# Patient Record
Sex: Male | Born: 1955 | Race: White | Hispanic: No | Marital: Single | Smoking: Former smoker
Health system: Southern US, Community
[De-identification: ages and names within clinical notes are randomized; demographics above are authoritative.]

## PROBLEM LIST (undated history)

## (undated) DIAGNOSIS — J45909 Unspecified asthma, uncomplicated: Secondary | ICD-10-CM

## (undated) DIAGNOSIS — K219 Gastro-esophageal reflux disease without esophagitis: Secondary | ICD-10-CM

## (undated) DIAGNOSIS — I639 Cerebral infarction, unspecified: Secondary | ICD-10-CM

## (undated) DIAGNOSIS — M109 Gout, unspecified: Secondary | ICD-10-CM

## (undated) DIAGNOSIS — D649 Anemia, unspecified: Secondary | ICD-10-CM

## (undated) DIAGNOSIS — Z992 Dependence on renal dialysis: Secondary | ICD-10-CM

## (undated) DIAGNOSIS — N289 Disorder of kidney and ureter, unspecified: Secondary | ICD-10-CM

## (undated) DIAGNOSIS — I2510 Atherosclerotic heart disease of native coronary artery without angina pectoris: Secondary | ICD-10-CM

## (undated) DIAGNOSIS — R569 Unspecified convulsions: Secondary | ICD-10-CM

## (undated) DIAGNOSIS — M1990 Unspecified osteoarthritis, unspecified site: Secondary | ICD-10-CM

## (undated) DIAGNOSIS — I10 Essential (primary) hypertension: Secondary | ICD-10-CM

## (undated) DIAGNOSIS — J189 Pneumonia, unspecified organism: Secondary | ICD-10-CM

## (undated) HISTORY — PX: AV FISTULA PLACEMENT: SHX1204

## (undated) SURGERY — Surgical Case
Anesthesia: *Unknown

---

## 2006-04-26 ENCOUNTER — Emergency Department: Payer: Self-pay | Admitting: Emergency Medicine

## 2006-11-05 ENCOUNTER — Ambulatory Visit: Payer: Self-pay | Admitting: Cardiovascular Disease

## 2006-11-19 ENCOUNTER — Emergency Department: Payer: Self-pay | Admitting: Emergency Medicine

## 2006-11-19 ENCOUNTER — Other Ambulatory Visit: Payer: Self-pay

## 2007-12-13 ENCOUNTER — Ambulatory Visit: Payer: Self-pay | Admitting: Family Medicine

## 2008-05-30 ENCOUNTER — Ambulatory Visit: Payer: Self-pay | Admitting: Family Medicine

## 2008-10-25 ENCOUNTER — Ambulatory Visit: Payer: Self-pay | Admitting: Family Medicine

## 2009-03-01 ENCOUNTER — Emergency Department: Payer: Self-pay | Admitting: Emergency Medicine

## 2009-05-30 ENCOUNTER — Ambulatory Visit: Payer: Self-pay

## 2009-06-27 ENCOUNTER — Ambulatory Visit: Payer: Self-pay | Admitting: Gastroenterology

## 2010-11-17 DIAGNOSIS — Z992 Dependence on renal dialysis: Secondary | ICD-10-CM

## 2010-11-17 HISTORY — PX: CARDIAC CATHETERIZATION: SHX172

## 2010-11-17 HISTORY — DX: Dependence on renal dialysis: Z99.2

## 2011-09-10 ENCOUNTER — Emergency Department: Payer: Self-pay | Admitting: Internal Medicine

## 2012-09-09 ENCOUNTER — Ambulatory Visit: Payer: Self-pay | Admitting: Vascular Surgery

## 2012-09-09 LAB — CBC
HCT: 31.9 % — ABNORMAL LOW (ref 40.0–52.0)
MCH: 30.5 pg (ref 26.0–34.0)
MCHC: 33.4 g/dL (ref 32.0–36.0)
MCV: 91 fL (ref 80–100)
Platelet: 175 10*3/uL (ref 150–440)
RDW: 14.8 % — ABNORMAL HIGH (ref 11.5–14.5)

## 2012-09-09 LAB — BASIC METABOLIC PANEL
Anion Gap: 8 (ref 7–16)
BUN: 29 mg/dL — ABNORMAL HIGH (ref 7–18)
Chloride: 101 mmol/L (ref 98–107)
Co2: 29 mmol/L (ref 21–32)
Creatinine: 6.98 mg/dL — ABNORMAL HIGH (ref 0.60–1.30)
EGFR (African American): 9 — ABNORMAL LOW
EGFR (Non-African Amer.): 8 — ABNORMAL LOW
Glucose: 123 mg/dL — ABNORMAL HIGH (ref 65–99)

## 2012-09-17 ENCOUNTER — Ambulatory Visit: Payer: Self-pay | Admitting: Vascular Surgery

## 2013-01-24 ENCOUNTER — Ambulatory Visit: Payer: Self-pay | Admitting: Vascular Surgery

## 2013-03-18 ENCOUNTER — Inpatient Hospital Stay: Payer: Self-pay | Admitting: Internal Medicine

## 2013-03-18 LAB — CBC
HGB: 9.2 g/dL — ABNORMAL LOW (ref 13.0–18.0)
MCHC: 34 g/dL (ref 32.0–36.0)
MCV: 97 fL (ref 80–100)
Platelet: 141 10*3/uL — ABNORMAL LOW (ref 150–440)
RBC: 2.79 10*6/uL — ABNORMAL LOW (ref 4.40–5.90)
RDW: 14 % (ref 11.5–14.5)
WBC: 3.5 10*3/uL — ABNORMAL LOW (ref 3.8–10.6)

## 2013-03-18 LAB — COMPREHENSIVE METABOLIC PANEL
Albumin: 3.5 g/dL (ref 3.4–5.0)
Alkaline Phosphatase: 103 U/L (ref 50–136)
Anion Gap: 14 (ref 7–16)
Chloride: 99 mmol/L (ref 98–107)
Co2: 24 mmol/L (ref 21–32)
Creatinine: 20.64 mg/dL — ABNORMAL HIGH (ref 0.60–1.30)
EGFR (African American): 3 — ABNORMAL LOW
EGFR (Non-African Amer.): 2 — ABNORMAL LOW
Potassium: 4.7 mmol/L (ref 3.5–5.1)
SGOT(AST): 10 U/L — ABNORMAL LOW (ref 15–37)
SGPT (ALT): 22 U/L (ref 12–78)

## 2013-03-18 LAB — TROPONIN I: Troponin-I: <0.02 ng/mL

## 2013-03-18 LAB — POTASSIUM: Potassium: 4.7 mmol/L (ref 3.5–5.1)

## 2013-03-19 LAB — CBC WITH DIFFERENTIAL/PLATELET
Basophil #: 0 10*3/uL (ref 0.0–0.1)
Basophil %: 0.2 %
Eosinophil %: 0.3 %
HCT: 29.4 % — ABNORMAL LOW (ref 40.0–52.0)
HGB: 10.1 g/dL — ABNORMAL LOW (ref 13.0–18.0)
Lymphocyte #: 0.4 10*3/uL — ABNORMAL LOW (ref 1.0–3.6)
MCHC: 34.2 g/dL (ref 32.0–36.0)
Neutrophil #: 1.4 10*3/uL (ref 1.4–6.5)
Neutrophil %: 72.4 %
Platelet: 144 10*3/uL — ABNORMAL LOW (ref 150–440)
RBC: 3.06 10*6/uL — ABNORMAL LOW (ref 4.40–5.90)
RDW: 14 % (ref 11.5–14.5)
WBC: 2 10*3/uL — CL (ref 3.8–10.6)

## 2013-03-19 LAB — BASIC METABOLIC PANEL
Anion Gap: 12 (ref 7–16)
Co2: 26 mmol/L (ref 21–32)
Creatinine: 15.36 mg/dL — ABNORMAL HIGH (ref 0.60–1.30)
EGFR (African American): 4 — ABNORMAL LOW

## 2013-03-19 LAB — PHOSPHORUS: Phosphorus: 5.8 mg/dL — ABNORMAL HIGH (ref 2.5–4.9)

## 2013-04-09 ENCOUNTER — Inpatient Hospital Stay: Payer: Self-pay | Admitting: Family Medicine

## 2013-04-09 LAB — CBC
HCT: 28.1 % — ABNORMAL LOW (ref 40.0–52.0)
MCHC: 33.9 g/dL (ref 32.0–36.0)
MCV: 99 fL (ref 80–100)
Platelet: 117 10*3/uL — ABNORMAL LOW (ref 150–440)
RBC: 2.84 10*6/uL — ABNORMAL LOW (ref 4.40–5.90)
RDW: 14.6 % — ABNORMAL HIGH (ref 11.5–14.5)
WBC: 4.4 10*3/uL (ref 3.8–10.6)

## 2013-04-09 LAB — BASIC METABOLIC PANEL
Anion Gap: 12 (ref 7–16)
Chloride: 105 mmol/L (ref 98–107)
Co2: 24 mmol/L (ref 21–32)
Creatinine: 14.73 mg/dL — ABNORMAL HIGH (ref 0.60–1.30)
EGFR (African American): 4 — ABNORMAL LOW
EGFR (Non-African Amer.): 3 — ABNORMAL LOW
Glucose: 82 mg/dL (ref 65–99)
Osmolality: 298 (ref 275–301)
Potassium: 4.1 mmol/L (ref 3.5–5.1)
Sodium: 141 mmol/L (ref 136–145)

## 2013-04-09 LAB — PHOSPHORUS: Phosphorus: 3.4 mg/dL (ref 2.5–4.9)

## 2013-04-10 LAB — CBC WITH DIFFERENTIAL/PLATELET
Eosinophil %: 2.1 %
Lymphocyte #: 1 10*3/uL (ref 1.0–3.6)
MCHC: 34.4 g/dL (ref 32.0–36.0)
MCV: 99 fL (ref 80–100)
Monocyte %: 15.3 %
Neutrophil #: 3.3 10*3/uL (ref 1.4–6.5)
Neutrophil %: 63.4 %
Platelet: 126 10*3/uL — ABNORMAL LOW (ref 150–440)
RBC: 2.81 10*6/uL — ABNORMAL LOW (ref 4.40–5.90)
RDW: 15.1 % — ABNORMAL HIGH (ref 11.5–14.5)
WBC: 5.3 10*3/uL (ref 3.8–10.6)

## 2013-04-10 LAB — COMPREHENSIVE METABOLIC PANEL
Alkaline Phosphatase: 65 U/L (ref 50–136)
Anion Gap: 9 (ref 7–16)
Bilirubin,Total: 0.4 mg/dL (ref 0.2–1.0)
Calcium, Total: 9.1 mg/dL (ref 8.5–10.1)
Co2: 29 mmol/L (ref 21–32)
Glucose: 78 mg/dL (ref 65–99)
Osmolality: 283 (ref 275–301)
SGOT(AST): 20 U/L (ref 15–37)
SGPT (ALT): 20 U/L (ref 12–78)
Sodium: 139 mmol/L (ref 136–145)
Total Protein: 6.3 g/dL — ABNORMAL LOW (ref 6.4–8.2)

## 2013-04-19 ENCOUNTER — Emergency Department: Payer: Self-pay | Admitting: Emergency Medicine

## 2013-04-19 LAB — URINALYSIS, COMPLETE
Bilirubin,UR: NEGATIVE
Glucose,UR: 150 mg/dL (ref 0–75)
Leukocyte Esterase: NEGATIVE
Nitrite: NEGATIVE
RBC,UR: NONE SEEN /HPF (ref 0–5)
Specific Gravity: 1.006 (ref 1.003–1.030)
WBC UR: 1 /HPF (ref 0–5)

## 2013-04-19 LAB — COMPREHENSIVE METABOLIC PANEL
Albumin: 3.7 g/dL (ref 3.4–5.0)
Alkaline Phosphatase: 88 U/L (ref 50–136)
Bilirubin,Total: 0.5 mg/dL (ref 0.2–1.0)
Calcium, Total: 9.3 mg/dL (ref 8.5–10.1)
Chloride: 101 mmol/L (ref 98–107)
EGFR (Non-African Amer.): 4 — ABNORMAL LOW
Glucose: 142 mg/dL — ABNORMAL HIGH (ref 65–99)
Potassium: 3.9 mmol/L (ref 3.5–5.1)
SGOT(AST): 38 U/L — ABNORMAL HIGH (ref 15–37)
Sodium: 138 mmol/L (ref 136–145)
Total Protein: 6.9 g/dL (ref 6.4–8.2)

## 2013-04-19 LAB — CBC
HGB: 10.9 g/dL — ABNORMAL LOW (ref 13.0–18.0)
MCH: 34.8 pg — ABNORMAL HIGH (ref 26.0–34.0)
MCV: 102 fL — ABNORMAL HIGH (ref 80–100)
Platelet: 189 10*3/uL (ref 150–440)

## 2013-04-19 LAB — DRUG SCREEN, URINE
Barbiturates, Ur Screen: NEGATIVE (ref ?–200)
Benzodiazepine, Ur Scrn: NEGATIVE (ref ?–200)
Cannabinoid 50 Ng, Ur ~~LOC~~: NEGATIVE (ref ?–50)
MDMA (Ecstasy)Ur Screen: NEGATIVE (ref ?–500)
Methadone, Ur Screen: NEGATIVE (ref ?–300)
Phencyclidine (PCP) Ur S: NEGATIVE (ref ?–25)
Tricyclic, Ur Screen: NEGATIVE (ref ?–1000)

## 2013-04-19 LAB — ETHANOL
Ethanol %: <0.003 % (ref 0.000–0.080)
Ethanol: <3 mg/dL

## 2013-05-18 DIAGNOSIS — Z1152 Encounter for screening for COVID-19: Secondary | ICD-10-CM | POA: Insufficient documentation

## 2013-06-15 ENCOUNTER — Ambulatory Visit: Payer: Self-pay | Admitting: Family Medicine

## 2013-07-05 ENCOUNTER — Observation Stay: Payer: Self-pay | Admitting: Specialist

## 2013-07-05 LAB — CBC
HCT: 34.5 % — ABNORMAL LOW (ref 40.0–52.0)
Platelet: 161 10*3/uL (ref 150–440)
WBC: 6.1 10*3/uL (ref 3.8–10.6)

## 2013-07-05 LAB — COMPREHENSIVE METABOLIC PANEL
Albumin: 4.1 g/dL (ref 3.4–5.0)
Anion Gap: 9 (ref 7–16)
BUN: 57 mg/dL — ABNORMAL HIGH (ref 7–18)
Bilirubin,Total: 0.4 mg/dL (ref 0.2–1.0)
Calcium, Total: 10 mg/dL (ref 8.5–10.1)
Co2: 25 mmol/L (ref 21–32)
EGFR (African American): 5 — ABNORMAL LOW
Glucose: 85 mg/dL (ref 65–99)
Osmolality: 283 (ref 275–301)
Sodium: 134 mmol/L — ABNORMAL LOW (ref 136–145)

## 2013-07-05 LAB — PHOSPHORUS: Phosphorus: 5.4 mg/dL — ABNORMAL HIGH (ref 2.5–4.9)

## 2013-07-06 DIAGNOSIS — I059 Rheumatic mitral valve disease, unspecified: Secondary | ICD-10-CM

## 2013-07-06 LAB — CBC WITH DIFFERENTIAL/PLATELET
Basophil #: 0 10*3/uL (ref 0.0–0.1)
Basophil %: 0.9 %
Eosinophil %: 3.2 %
HCT: 33.2 % — ABNORMAL LOW (ref 40.0–52.0)
HGB: 11.4 g/dL — ABNORMAL LOW (ref 13.0–18.0)
Lymphocyte #: 1 10*3/uL (ref 1.0–3.6)
MCH: 33.6 pg (ref 26.0–34.0)
MCV: 98 fL (ref 80–100)
Monocyte %: 10.5 %
Neutrophil %: 67 %
RBC: 3.39 10*6/uL — ABNORMAL LOW (ref 4.40–5.90)
RDW: 13.4 % (ref 11.5–14.5)
WBC: 5.7 10*3/uL (ref 3.8–10.6)

## 2013-07-06 LAB — BASIC METABOLIC PANEL
Anion Gap: 7 (ref 7–16)
Chloride: 98 mmol/L (ref 98–107)
Creatinine: 8.33 mg/dL — ABNORMAL HIGH (ref 0.60–1.30)
Glucose: 86 mg/dL (ref 65–99)
Osmolality: 277 (ref 275–301)
Potassium: 3.8 mmol/L (ref 3.5–5.1)
Sodium: 136 mmol/L (ref 136–145)

## 2013-07-06 LAB — TROPONIN I
Troponin-I: 0.03 ng/mL
Troponin-I: 0.03 ng/mL

## 2013-07-06 LAB — LIPID PANEL
Cholesterol: 122 mg/dL (ref 0–200)
Ldl Cholesterol, Calc: 52 mg/dL (ref 0–100)

## 2013-07-06 LAB — TSH: Thyroid Stimulating Horm: 2.6 u[IU]/mL

## 2013-07-11 DIAGNOSIS — K219 Gastro-esophageal reflux disease without esophagitis: Secondary | ICD-10-CM | POA: Insufficient documentation

## 2013-12-30 ENCOUNTER — Ambulatory Visit: Payer: Self-pay | Admitting: Vascular Surgery

## 2014-05-09 DIAGNOSIS — D689 Coagulation defect, unspecified: Secondary | ICD-10-CM | POA: Insufficient documentation

## 2014-05-11 ENCOUNTER — Ambulatory Visit: Payer: Self-pay | Admitting: Vascular Surgery

## 2014-05-18 ENCOUNTER — Emergency Department: Payer: Self-pay

## 2014-05-18 LAB — BASIC METABOLIC PANEL
ANION GAP: 7 (ref 7–16)
BUN: 12 mg/dL (ref 7–18)
CALCIUM: 8.3 mg/dL — AB (ref 8.5–10.1)
CO2: 33 mmol/L — AB (ref 21–32)
CREATININE: 5.73 mg/dL — AB (ref 0.60–1.30)
Chloride: 98 mmol/L (ref 98–107)
EGFR (African American): 12 — ABNORMAL LOW
GFR CALC NON AF AMER: 10 — AB
GLUCOSE: 88 mg/dL (ref 65–99)
Osmolality: 275 (ref 275–301)
POTASSIUM: 3.8 mmol/L (ref 3.5–5.1)
Sodium: 138 mmol/L (ref 136–145)

## 2014-05-18 LAB — CBC
HCT: 29.3 % — ABNORMAL LOW (ref 40.0–52.0)
HGB: 10 g/dL — ABNORMAL LOW (ref 13.0–18.0)
MCH: 33.2 pg (ref 26.0–34.0)
MCHC: 34 g/dL (ref 32.0–36.0)
MCV: 98 fL (ref 80–100)
Platelet: 136 10*3/uL — ABNORMAL LOW (ref 150–440)
RBC: 3 10*6/uL — AB (ref 4.40–5.90)
RDW: 13.4 % (ref 11.5–14.5)
WBC: 3.9 10*3/uL (ref 3.8–10.6)

## 2014-06-26 ENCOUNTER — Ambulatory Visit: Payer: Self-pay | Admitting: Vascular Surgery

## 2014-06-28 ENCOUNTER — Ambulatory Visit: Payer: Self-pay | Admitting: Vascular Surgery

## 2014-06-28 LAB — POTASSIUM: POTASSIUM: 5 mmol/L (ref 3.5–5.1)

## 2014-06-29 DIAGNOSIS — T82898A Other specified complication of vascular prosthetic devices, implants and grafts, initial encounter: Secondary | ICD-10-CM | POA: Insufficient documentation

## 2014-07-06 ENCOUNTER — Ambulatory Visit: Payer: Self-pay | Admitting: Vascular Surgery

## 2014-07-17 DIAGNOSIS — M1009 Idiopathic gout, multiple sites: Secondary | ICD-10-CM | POA: Insufficient documentation

## 2014-07-17 DIAGNOSIS — G20 Parkinson's disease: Secondary | ICD-10-CM | POA: Insufficient documentation

## 2014-07-17 DIAGNOSIS — J4520 Mild intermittent asthma, uncomplicated: Secondary | ICD-10-CM | POA: Insufficient documentation

## 2014-08-03 DIAGNOSIS — R197 Diarrhea, unspecified: Secondary | ICD-10-CM | POA: Insufficient documentation

## 2014-08-03 DIAGNOSIS — R52 Pain, unspecified: Secondary | ICD-10-CM | POA: Insufficient documentation

## 2014-08-03 DIAGNOSIS — T829XXA Unspecified complication of cardiac and vascular prosthetic device, implant and graft, initial encounter: Secondary | ICD-10-CM | POA: Insufficient documentation

## 2014-10-23 ENCOUNTER — Ambulatory Visit: Payer: Self-pay | Admitting: Vascular Surgery

## 2014-10-25 ENCOUNTER — Emergency Department (HOSPITAL_COMMUNITY): Payer: Medicare HMO

## 2014-10-25 ENCOUNTER — Observation Stay (HOSPITAL_COMMUNITY)
Admission: EM | Admit: 2014-10-25 | Discharge: 2014-10-26 | Disposition: A | Discharge status: Home / Self Care | Payer: Medicare HMO | Attending: Internal Medicine | Admitting: Internal Medicine

## 2014-10-25 ENCOUNTER — Encounter (HOSPITAL_COMMUNITY): Payer: Self-pay | Admitting: Emergency Medicine

## 2014-10-25 DIAGNOSIS — Z992 Dependence on renal dialysis: Secondary | ICD-10-CM | POA: Insufficient documentation

## 2014-10-25 DIAGNOSIS — G40909 Epilepsy, unspecified, not intractable, without status epilepticus: Secondary | ICD-10-CM | POA: Insufficient documentation

## 2014-10-25 DIAGNOSIS — N186 End stage renal disease: Secondary | ICD-10-CM | POA: Diagnosis not present

## 2014-10-25 DIAGNOSIS — Z87891 Personal history of nicotine dependence: Secondary | ICD-10-CM | POA: Diagnosis not present

## 2014-10-25 DIAGNOSIS — I120 Hypertensive chronic kidney disease with stage 5 chronic kidney disease or end stage renal disease: Secondary | ICD-10-CM | POA: Diagnosis not present

## 2014-10-25 DIAGNOSIS — I10 Essential (primary) hypertension: Secondary | ICD-10-CM | POA: Diagnosis present

## 2014-10-25 DIAGNOSIS — I25119 Atherosclerotic heart disease of native coronary artery with unspecified angina pectoris: Secondary | ICD-10-CM

## 2014-10-25 DIAGNOSIS — I2510 Atherosclerotic heart disease of native coronary artery without angina pectoris: Secondary | ICD-10-CM | POA: Insufficient documentation

## 2014-10-25 DIAGNOSIS — R079 Chest pain, unspecified: Principal | ICD-10-CM | POA: Insufficient documentation

## 2014-10-25 DIAGNOSIS — Z9889 Other specified postprocedural states: Secondary | ICD-10-CM | POA: Insufficient documentation

## 2014-10-25 DIAGNOSIS — R0789 Other chest pain: Secondary | ICD-10-CM | POA: Diagnosis present

## 2014-10-25 DIAGNOSIS — R569 Unspecified convulsions: Secondary | ICD-10-CM

## 2014-10-25 HISTORY — DX: Unspecified convulsions: R56.9

## 2014-10-25 HISTORY — DX: Dependence on renal dialysis: Z99.2

## 2014-10-25 HISTORY — DX: Essential (primary) hypertension: I10

## 2014-10-25 HISTORY — DX: Disorder of kidney and ureter, unspecified: N28.9

## 2014-10-25 HISTORY — DX: Atherosclerotic heart disease of native coronary artery without angina pectoris: I25.10

## 2014-10-25 LAB — CBC WITH DIFFERENTIAL/PLATELET
BASOS ABS: 0 10*3/uL (ref 0.0–0.1)
Basophils Relative: 0 % (ref 0–1)
Eosinophils Absolute: 0.1 10*3/uL (ref 0.0–0.7)
Eosinophils Relative: 2 % (ref 0–5)
HCT: 29.9 % — ABNORMAL LOW (ref 39.0–52.0)
Hemoglobin: 9.7 g/dL — ABNORMAL LOW (ref 13.0–17.0)
LYMPHS ABS: 1.5 10*3/uL (ref 0.7–4.0)
LYMPHS PCT: 23 % (ref 12–46)
MCH: 31.2 pg (ref 26.0–34.0)
MCHC: 32.4 g/dL (ref 30.0–36.0)
MCV: 96.1 fL (ref 78.0–100.0)
Monocytes Absolute: 0.8 10*3/uL (ref 0.1–1.0)
Monocytes Relative: 12 % (ref 3–12)
Neutro Abs: 4 10*3/uL (ref 1.7–7.7)
Neutrophils Relative %: 63 % (ref 43–77)
PLATELETS: 203 10*3/uL (ref 150–400)
RBC: 3.11 MIL/uL — AB (ref 4.22–5.81)
RDW: 14.2 % (ref 11.5–15.5)
WBC: 6.5 10*3/uL (ref 4.0–10.5)

## 2014-10-25 LAB — BASIC METABOLIC PANEL
ANION GAP: 18 — AB (ref 5–15)
BUN: 33 mg/dL — ABNORMAL HIGH (ref 6–23)
CO2: 29 meq/L (ref 19–32)
Calcium: 9.9 mg/dL (ref 8.4–10.5)
Chloride: 93 mEq/L — ABNORMAL LOW (ref 96–112)
Creatinine, Ser: 8.56 mg/dL — ABNORMAL HIGH (ref 0.50–1.35)
GFR calc Af Amer: 7 mL/min — ABNORMAL LOW (ref >90)
GFR calc non Af Amer: 6 mL/min — ABNORMAL LOW (ref >90)
GLUCOSE: 81 mg/dL (ref 70–99)
POTASSIUM: 4.9 meq/L (ref 3.7–5.3)
Sodium: 140 mEq/L (ref 137–147)

## 2014-10-25 LAB — I-STAT TROPONIN, ED: Troponin i, poc: 0.01 ng/mL (ref 0.00–0.08)

## 2014-10-25 MED ORDER — SODIUM CHLORIDE 0.9 % IV SOLN: 250.0000 mL | INTRAVENOUS | Status: DC | PRN

## 2014-10-25 MED ORDER — LEVETIRACETAM 500 MG PO TABS
500.0000 mg | ORAL_TABLET | Freq: Two times a day (BID) | ORAL | Status: DC
  Administered 2014-10-26 (×2): 500 mg via ORAL
  Filled 2014-10-25 (×2): qty 1

## 2014-10-25 MED ORDER — CARVEDILOL 25 MG PO TABS: 25.0000 mg | ORAL_TABLET | Freq: Every evening | ORAL | Status: DC

## 2014-10-25 MED ORDER — ONDANSETRON HCL 4 MG/2ML IJ SOLN: 4.0000 mg | Freq: Four times a day (QID) | INTRAMUSCULAR | Status: DC | PRN

## 2014-10-25 MED ORDER — SEVELAMER CARBONATE 800 MG PO TABS
2400.0000 mg | ORAL_TABLET | Freq: Three times a day (TID) | ORAL | Status: DC
  Administered 2014-10-26: 2400 mg via ORAL
  Filled 2014-10-25: qty 3

## 2014-10-25 MED ORDER — ONDANSETRON HCL 4 MG PO TABS: 4.0000 mg | ORAL_TABLET | Freq: Four times a day (QID) | ORAL | Status: DC | PRN

## 2014-10-25 MED ORDER — ASPIRIN EC 325 MG PO TBEC: 325.0000 mg | DELAYED_RELEASE_TABLET | Freq: Every day | ORAL | Status: DC

## 2014-10-25 MED ORDER — PANTOPRAZOLE SODIUM 40 MG PO TBEC: 40.0000 mg | DELAYED_RELEASE_TABLET | Freq: Every day | ORAL | Status: DC

## 2014-10-25 MED ORDER — SODIUM CHLORIDE 0.9 % IJ SOLN: 3.0000 mL | INTRAMUSCULAR | Status: DC | PRN

## 2014-10-25 MED ORDER — SODIUM CHLORIDE 0.9 % IJ SOLN: 3.0000 mL | Freq: Two times a day (BID) | INTRAMUSCULAR | Status: DC

## 2014-10-25 MED ORDER — ACETAMINOPHEN 325 MG PO TABS: 650.0000 mg | ORAL_TABLET | Freq: Four times a day (QID) | ORAL | Status: DC | PRN

## 2014-10-25 MED ORDER — HEPARIN SODIUM (PORCINE) 5000 UNIT/ML IJ SOLN
5000.0000 [IU] | Freq: Three times a day (TID) | INTRAMUSCULAR | Status: DC
  Administered 2014-10-26 (×2): 5000 [IU] via SUBCUTANEOUS
  Filled 2014-10-25 (×2): qty 1

## 2014-10-25 MED ORDER — ACETAMINOPHEN 650 MG RE SUPP: 650.0000 mg | Freq: Four times a day (QID) | RECTAL | Status: DC | PRN

## 2014-10-25 MED ORDER — CINACALCET HCL 30 MG PO TABS
30.0000 mg | ORAL_TABLET | Freq: Every day | ORAL | Status: DC
  Administered 2014-10-26: 30 mg via ORAL
  Filled 2014-10-25 (×2): qty 1

## 2014-10-25 MED ORDER — VITAMIN D 1000 UNITS PO TABS
1000.0000 [IU] | ORAL_TABLET | Freq: Every day | ORAL | Status: DC
  Filled 2014-10-25: qty 1

## 2014-10-25 MED ORDER — HYDROMORPHONE HCL 1 MG/ML IJ SOLN: 0.5000 mg | INTRAMUSCULAR | Status: DC | PRN

## 2014-10-25 MED ORDER — FOLIC ACID 0.5 MG HALF TAB
400.0000 ug | ORAL_TABLET | Freq: Every day | ORAL | Status: DC
  Filled 2014-10-25: qty 1

## 2014-10-25 MED ORDER — MORPHINE SULFATE 4 MG/ML IJ SOLN
4.0000 mg | Freq: Once | INTRAMUSCULAR | Status: AC
  Administered 2014-10-25: 4 mg via INTRAVENOUS
  Filled 2014-10-25: qty 1

## 2014-10-25 MED ORDER — LACOSAMIDE 50 MG PO TABS
150.0000 mg | ORAL_TABLET | Freq: Two times a day (BID) | ORAL | Status: DC
  Administered 2014-10-26 (×2): 150 mg via ORAL
  Filled 2014-10-25 (×2): qty 3

## 2014-10-25 MED ORDER — FELODIPINE ER 5 MG PO TB24
5.0000 mg | ORAL_TABLET | Freq: Every day | ORAL | Status: DC
  Filled 2014-10-25: qty 1

## 2014-10-25 MED ORDER — ALLOPURINOL 100 MG PO TABS: 200.0000 mg | ORAL_TABLET | Freq: Every day | ORAL | Status: DC

## 2014-10-25 MED ORDER — NITROGLYCERIN 2 % TD OINT
1.0000 [in_us] | TOPICAL_OINTMENT | Freq: Four times a day (QID) | TRANSDERMAL | Status: DC
  Administered 2014-10-25 – 2014-10-26 (×2): 1 [in_us] via TOPICAL
  Filled 2014-10-25: qty 1
  Filled 2014-10-25: qty 30

## 2014-10-25 NOTE — ED Notes (Signed)
Pending patient approval, contact Sister if needed  Jeremy Rose  [phone]

## 2014-10-25 NOTE — ED Provider Notes (Signed)
CSN: [id number]     Arrival date & time 10/25/14  1958 History   First MD Initiated Contact with Patient 10/25/14 2000     Chief Complaint  Patient presents with  . Chest Pain     (Consider location/radiation/quality/duration/timing/severity/associated sxs/prior Treatment) Patient is a 58 y.o. male presenting with chest pain. The history is provided by the patient and medical records.  Chest Pain   This is a 58 year old male with past medical history significant for hypertension, coronary artery disease, end-stage renal disease on hemodialysis, seizure disorder, presenting to the ED for chest pain.  States pain began this afternoon around 1730 while he was doing yard work, specifically he was raking leaves and cleaning up brush. States pain localized to the right side of his chest without associated shortness of breath, diaphoresis, dizziness, nausea, or vomiting.  He describes the pain as a "soreness".  Patient states he went inside and attempted to rest without relief. Patient does have family history of heart disease, mother had an MI at age 39. Patient is a former smoker. Last completed dialysis yesterday, full treatment. No noted cough, fever, sweats, or chills. No prior hx of DVT or PE.  Patient was given 325 of aspirin and nitroglycerin in route with noted improvement of his symptoms.  Patient states he has been evaluated by cardiology in the past in [place], [place] recall name of physician. PCP-- [facility] in [place], [place]   Past Medical History  Diagnosis Date  . Coronary artery disease   . Hypertension   . Renal disorder   . Dialysis patient     T, TH, S  . Seizures    Past Surgical History  Procedure Laterality Date  . Av fistula placement Left   . Cardiac catheterization     History reviewed. No pertinent family history. History  Substance Use Topics  . Smoking status: Former Research scientist (life sciences)  . Smokeless tobacco: Former Systems developer  . Alcohol Use: No    Review of Systems   Cardiovascular: Positive for chest pain.  All other systems reviewed and are negative.     Allergies  Codeine  Home Medications   Prior to Admission medications   Not on File   BP 117/72 mmHg  Pulse 71  Temp(Src) 97.8 F (36.6 C) (Oral)  Resp 15  Ht 5\' 8"  (1.727 m)  Wt 180 lb (81.647 kg)  BMI 27.38 kg/m2  SpO2 97%   Physical Exam  Constitutional: He is oriented to person, place, and time. He appears well-developed and well-nourished. No distress.  HENT:  Head: Normocephalic and atraumatic.  Mouth/Throat: Oropharynx is clear and moist.  Eyes: Conjunctivae and EOM are normal. Pupils are equal, round, and reactive to light.  Neck: Normal range of motion. Neck supple.  Cardiovascular: Normal rate, regular rhythm and normal heart sounds.   Pulmonary/Chest: Effort normal and breath sounds normal. No respiratory distress. He has no wheezes.  Abdominal: Soft. Bowel sounds are normal. There is no tenderness. There is no guarding.  Musculoskeletal: Normal range of motion. He exhibits no edema.  Dialysis fistula of left forearm with overlying bandages, strong thrill present  Neurological: He is alert and oriented to person, place, and time.  Skin: Skin is warm and dry. He is not diaphoretic.  Psychiatric: He has a normal mood and affect.  Nursing note and vitals reviewed.   ED Course  Procedures (including critical care time) Labs Review Labs Reviewed  CBC WITH DIFFERENTIAL - Abnormal; Notable for the following:  RBC 3.11 (*)    Hemoglobin 9.7 (*)    HCT 29.9 (*)    All other components within normal limits  BASIC METABOLIC PANEL - Abnormal; Notable for the following:    Chloride 93 (*)    BUN 33 (*)    Creatinine, Ser 8.56 (*)    GFR calc non Af Amer 6 (*)    GFR calc Af Amer 7 (*)    Anion gap 18 (*)    All other components within normal limits  I-STAT TROPOININ, ED    Imaging Review Dg Chest 2 View  10/25/2014   CLINICAL DATA:  Initial encounter for chest  pain after dialysis today.  EXAM: CHEST  2 VIEW  COMPARISON:  None.  FINDINGS: The lungs are clear without focal infiltrate, edema, pneumothorax or pleural effusion. Interstitial markings are diffusely coarsened with chronic features. Cardiopericardial silhouette is at upper limits of normal for size. Imaged bony structures of the thorax are intact.  IMPRESSION: No active cardiopulmonary disease.   Electronically Signed   By: Misty Stanley M.D.   On: 10/25/2014 21:23     EKG Interpretation   Date/Time:  Wednesday October 25 2014 20:03:32 EST Ventricular Rate:  66 PR Interval:  163 QRS Duration: 92 QT Interval:  387 QTC Calculation: 405 R Axis:   15 Text Interpretation:  Sinus rhythm No old tracing to compare Confirmed by  Debby Freiberg [phone]) on 10/25/2014 9:37:04 PM      MDM   Final diagnoses:  Chest pain   58 year old male with new onset exertional chest pain today while raking leaves, unrelieved by rest.  On exam, pain somewhat reproducible with palpation of right chest wall.  EKG sinus rhythm without ischemic changes. Will obtain labs, troponin, chest x-ray.  Labwork with creatinine 8.56 consistent with end-stage renal disease. Potassium normal 4.9.  Troponin negative.  CXR clear.  Patient with new exertional chest pain and HEART score of 5, feel he would benefit from cardiac r/o.  Case discussed with hospitalist, Dr. Arnoldo Morale, who will admit for observation.  Temp admission orders placed.  Larene Pickett, PA-C 10/26/14 0102  Debby Freiberg, MD 10/30/14 0900

## 2014-10-25 NOTE — H&P (Signed)
[facility] Admission History and Physical       Jeremy Rose [id number] DOB: October 13, 1956 DOA: 10/25/2014  Referring physician: EDP PCP: Pcp Not In System  Specialists:   Chief Complaint: Chest Pain  HPI: Jeremy Rose is a 58 y.o. male with a history of CAD, HTN, ESRD on HD (T, TH, Sat), and history of Seizures who presents to the ED with complaints of Substernal Chest pain described as soreness rated at a 6/10 at the worse that started at 5:30 pm after he was working in his yard.  He denies having any SOB, nausea, or vomiting or diaphoresis.  The pain was not relieved until he had been given NTG x 2 and Aspirin by EMS.   His ED initial workup has been negative.   Review of Systems:  Constitutional: No Weight Loss, No Weight Gain, Night Sweats, Fevers, Chills, Dizziness, Fatigue, or Generalized Weakness HEENT: No Headaches, Difficulty Swallowing,Tooth/Dental Problems,Sore Throat,  No Sneezing, Rhinitis, Ear Ache, Nasal Congestion, or Post Nasal Drip,  Cardio-vascular:   +Chest pain, Orthopnea, PND, Edema in Lower Extremities, Anasarca, Dizziness, Palpitations  Resp: No Dyspnea, No DOE, No Cough, No Hemoptysis, No Wheezing.    GI: No Heartburn, Indigestion, Abdominal Pain, Nausea, Vomiting, Diarrhea, Hematemesis, Hematochezia, Melena, Change in Bowel Habits,  Loss of Appetite  GU: No Dysuria, Change in Color of Urine, No Urgency or Frequency, No Flank pain.  Musculoskeletal: No Joint Pain or Swelling, No Decreased Range of Motion, No Back Pain.  Neurologic: No Syncope, No Seizures, Muscle Weakness, Paresthesia, Vision Disturbance or Loss, No Diplopia, No Vertigo, No Difficulty Walking,  Skin: No Rash or Lesions. Psych: No Change in Mood or Affect, No Depression or Anxiety, No Memory loss, No Confusion, or Hallucinations   Past Medical History  Diagnosis Date  . Coronary artery disease   . Hypertension   . Renal disorder   . Dialysis patient     T, TH, S  .  Seizures       Past Surgical History  Procedure Laterality Date  . Av fistula placement Left   . Cardiac catheterization         Prior to Admission medications   Medication Sig Start Date End Date Taking? Authorizing Provider  cinacalcet (SENSIPAR) 30 MG tablet Take 30 mg by mouth daily.   Yes Historical Provider, MD  albuterol (PROVENTIL HFA;VENTOLIN HFA) 108 (90 BASE) MCG/ACT inhaler Inhale into the lungs every 6 (six) hours as needed for wheezing or shortness of breath.    Historical Provider, MD  allopurinol (ZYLOPRIM) 100 MG tablet Take 200 mg by mouth daily.   Yes Historical Provider, MD  beclomethasone (QVAR) 80 MCG/ACT inhaler Inhale 1 puff into the lungs as needed (for shortness of breath).    Historical Provider, MD  carvedilol (COREG) 25 MG tablet Take 25 mg by mouth every evening.   Yes Historical Provider, MD  Cholecalciferol 1000 UNITS capsule Take 1,000 Units by mouth daily.   Yes Historical Provider, MD  felodipine (PLENDIL) 5 MG 24 hr tablet Take 5 mg by mouth daily.   Yes Historical Provider, MD  folic acid (FOLVITE) A999333 MCG tablet Take 400 mcg by mouth daily.   Yes Historical Provider, MD  Lacosamide (VIMPAT) 150 MG TABS Take 150 mg by mouth 2 (two) times daily. Takes extra tab on Tues, Thurs, and Sat   Yes Historical Provider, MD  levETIRAcetam (KEPPRA) 500 MG tablet Take 500 mg by mouth 2 (two) times daily. Takes extra with HD  Yes Historical Provider, MD  omeprazole (PRILOSEC) 20 MG capsule Take 20 mg by mouth daily.   Yes Historical Provider, MD  sevelamer carbonate (RENVELA) 800 MG tablet Take 2,400 mg by mouth 3 (three) times daily with meals.   Yes Historical Provider, MD      Allergies  Allergen Reactions  . Codeine Nausea And Vomiting     Social History:  reports that he has quit smoking. He has quit using smokeless tobacco. He reports that he does not drink alcohol or use illicit drugs.     History reviewed. No pertinent family history.      Physical Exam:  GEN:  Pleasant Overweight Well Developed 58 y.o. Caucasian male examined and in no acute distress; cooperative with exam Filed Vitals:   10/25/14 2007 10/25/14 2015 10/25/14 2215 10/25/14 2230  BP: 109/74 117/72 124/85 137/77  Pulse: 72 71 75 76  Temp: 97.8 F (36.6 C)     TempSrc: Oral     Resp: 15 15 16 13   Height: 5\' 8"  (1.727 m)     Weight: 81.647 kg (180 lb)     SpO2: 95% 97% 97% 98%   Blood pressure 137/77, pulse 76, temperature 97.8 F (36.6 C), temperature source Oral, resp. rate 13, height 5\' 8"  (1.727 m), weight 81.647 kg (180 lb), SpO2 98 %. PSYCH: He is alert and oriented x4; does not appear anxious does not appear depressed; affect is normal HEENT: Normocephalic and Atraumatic, Mucous membranes pink; PERRLA; EOM intact; Fundi:  Benign;  No scleral icterus, Nares: Patent, Oropharynx: Clear, Edentulous,    Neck:  FROM, No Cervical Lymphadenopathy nor Thyromegaly or Carotid Bruit; No JVD; Breasts:: Not examined CHEST WALL: No tenderness CHEST: Normal respiration, clear to auscultation bilaterally HEART: Regular rate and rhythm; no murmurs rubs or gallops BACK: No kyphosis or scoliosis; No CVA tenderness ABDOMEN: Positive Bowel Sounds,  Obese, Soft Non-Tender; No Masses, No Organomegaly Rectal Exam: Not done EXTREMITIES: No Cyanosis, Clubbing, or Edema; No Ulcerations. Genitalia: not examined PULSES: 2+ and symmetric SKIN: Normal hydration no rash or ulceration CNS:  Alert and Oriented x 4, No Focal Deficits Vascular: pulses palpable throughout    Labs on Admission:  Basic Metabolic Panel:  Recent Labs Lab 10/25/14 2022  NA 140  K 4.9  CL 93*  CO2 29  GLUCOSE 81  BUN 33*  CREATININE 8.56*  CALCIUM 9.9   Liver Function Tests: No results for input(s): AST, ALT, ALKPHOS, BILITOT, PROT, ALBUMIN in the last 168 hours. No results for input(s): LIPASE, AMYLASE in the last 168 hours. No results for input(s): AMMONIA in the last 168  hours. CBC:  Recent Labs Lab 10/25/14 2022  WBC 6.5  NEUTROABS 4.0  HGB 9.7*  HCT 29.9*  MCV 96.1  PLT 203   Cardiac Enzymes: No results for input(s): CKTOTAL, CKMB, CKMBINDEX, TROPONINI in the last 168 hours.  BNP (last 3 results) No results for input(s): PROBNP in the last 8760 hours. CBG: No results for input(s): GLUCAP in the last 168 hours.  Radiological Exams on Admission: Dg Chest 2 View  10/25/2014   CLINICAL DATA:  Initial encounter for chest pain after dialysis today.  EXAM: CHEST  2 VIEW  COMPARISON:  None.  FINDINGS: The lungs are clear without focal infiltrate, edema, pneumothorax or pleural effusion. Interstitial markings are diffusely coarsened with chronic features. Cardiopericardial silhouette is at upper limits of normal for size. Imaged bony structures of the thorax are intact.  IMPRESSION: No active cardiopulmonary disease.  Electronically Signed   By: Misty Stanley M.D.   On: 10/25/2014 21:23     EKG: Independently reviewed. Normal Sinus Rhyhm Rate 66 without Acute Changes   Assessment/Plan:   58 y.o. male with  Principal Problem:   1.    Chest pain on exertion   Telemetry Monitoring   Cycle Troponins   Nitropaste, O2, Continue Carvedilol   ASA Rx   Active Problems:   2.   Coronary artery disease   On Carvedilol, ASA    3.   Hypertension   On Carvedilol and Felodipine Rx     4.   ESRD (end stage renal disease) on dialysis   On Dialysis T, Th, Sats   Notify Nephrology to keep Dialysis Schedule     5.   Seizures   Continue Keppra and Vimpat Rx     6.   DVT Prophylaxis   SQ Heparin    Code Status:    FULL CODE   Family Communication:    No Family Present Disposition Plan:    Observation/ Telemetry UNit     Time spent:  [facility] Pager [phone]   If [place] Please Contact the Day Rounding Team MD for [facility]  If 7PM-7AM, Please Contact Night-Floor Coverage   [email or web address] Password [email or web address] 10/25/2014, 10:55 PM

## 2014-10-25 NOTE — ED Notes (Signed)
Patient transported to X-ray 

## 2014-10-25 NOTE — ED Notes (Signed)
Pt brought to ED by EMS from home for c/o 6/10 right chest pain, pt denies any SOB, dizziness or distress. Pt states pain started around 5:30 pm today after working on his yard no relief with rest. 2 Nitro spray sl given by EMS and 324 mg of ASA.

## 2014-10-25 NOTE — ED Notes (Signed)
Admitting MD at the bedside.  

## 2014-10-26 DIAGNOSIS — I10 Essential (primary) hypertension: Secondary | ICD-10-CM

## 2014-10-26 DIAGNOSIS — R0789 Other chest pain: Secondary | ICD-10-CM

## 2014-10-26 LAB — BASIC METABOLIC PANEL
Anion gap: 16 — ABNORMAL HIGH (ref 5–15)
BUN: 40 mg/dL — AB (ref 6–23)
CHLORIDE: 96 meq/L (ref 96–112)
CO2: 30 meq/L (ref 19–32)
CREATININE: 9.6 mg/dL — AB (ref 0.50–1.35)
Calcium: 9.6 mg/dL (ref 8.4–10.5)
GFR calc Af Amer: 6 mL/min — ABNORMAL LOW (ref >90)
GFR calc non Af Amer: 5 mL/min — ABNORMAL LOW (ref >90)
GLUCOSE: 118 mg/dL — AB (ref 70–99)
POTASSIUM: 4.9 meq/L (ref 3.7–5.3)
Sodium: 142 mEq/L (ref 137–147)

## 2014-10-26 LAB — CBC
HEMATOCRIT: 29.4 % — AB (ref 39.0–52.0)
Hemoglobin: 9.5 g/dL — ABNORMAL LOW (ref 13.0–17.0)
MCH: 31.7 pg (ref 26.0–34.0)
MCHC: 32.3 g/dL (ref 30.0–36.0)
MCV: 98 fL (ref 78.0–100.0)
Platelets: 176 10*3/uL (ref 150–400)
RBC: 3 MIL/uL — AB (ref 4.22–5.81)
RDW: 14.2 % (ref 11.5–15.5)
WBC: 5.1 10*3/uL (ref 4.0–10.5)

## 2014-10-26 LAB — TROPONIN I
Troponin I: <0.3 ng/mL (ref <0.30)
Troponin I: <0.3 ng/mL (ref <0.30)

## 2014-10-26 LAB — MRSA PCR SCREENING: MRSA BY PCR: NEGATIVE

## 2014-10-26 NOTE — ED Notes (Signed)
Phone call received from family member, she states she is Mrs. Jeremy Rose, pt's sister. Family member asking for information over the phone about the pt. Orientation given to pt's sister that we are unable to give any information over the phone, she states that pt is mentally disable and she is the one that takes care of him, reinforcement given by this RN that per policy I am unable to give any information over the phone for pt privacy protection, that I am able to give the phone to the pt and he can tell her any information that he wants, but I can't help her with this over the phone, sister express her frustration about it.

## 2014-10-26 NOTE — Discharge Summary (Signed)
Discharge instructions reviewed with patient. Questions answered. Denies further questions. Patient aware of appointment made for him with his cardiologist Dr. Humphrey Rolls on 11/06/14 at 9:15am. Patient states he will be able to get a ride to the appointment. IV and telemetry discontinued. Cab called and due to arrive at main entrance in ten minutes to take patient to dialysis.

## 2014-10-26 NOTE — Discharge Summary (Addendum)
Physician Discharge Summary  Jeremy Rose [id number] DOB: 1956-05-20 DOA: 10/25/2014  PCP: Pcp Not In System  Admit date: 10/25/2014 Discharge date: 10/26/2014  Time spent: 30 minutes  Recommendations for Outpatient Follow-up:  1. Follow up with Dr. Humphrey Rolls as an outpatient  Discharge Diagnoses:  Principal Problem:   Atypical chest pain Active Problems:   Coronary artery disease   Hypertension   ESRD (end stage renal disease) on dialysis   Seizures   Discharge Condition: stable  Diet recommendation: heart healthy  Filed Weights   10/25/14 2007 10/26/14 0403  Weight: 81.647 kg (180 lb) 76.703 kg (169 lb 1.6 oz)    History of present illness:  58 y.o. male with a history of CAD, HTN, ESRD on HD (T, TH, Sat), and history of Seizures who presents to the ED with complaints of Substernal Chest pain described as soreness rated at a 6/10 at the worse that started at 5:30 pm after he was working in his yard. He denies having any SOB, nausea, or vomiting or diaphoresis. The pain was not relieved until he had been given NTG x 2 and Aspirin by EMS.   Hospital Course:  Atypical chest pain: - Monitor on telemetry with no events, cardiac enzymes negative 3. - Pain is reproducible by palpation he relates he was picking up his yard 4 days prior to this event. - Cardiology was consulted which agreed, follow-up with his regular cardiology as an outpatient.  Essential hypertension: - No changes were made.  End-stage renal disease: - Follow-up with his dialysis center today.  History of seizures: - No changes were made.  Procedures:  CXR (i.e. Studies not automatically included, echos, thoracentesis, etc; not x-rays)  Consultations:  cardiology  Discharge Exam: Filed Vitals:   10/26/14 0403  BP: 125/70  Pulse: 74  Temp: 98.5 F (36.9 C)  Resp: 20    General: A&O x3 Cardiovascular: RRR Respiratory: good air movement CTA B/L  Discharge Instructions You were  cared for by a hospitalist during your hospital stay. If you have any questions about your discharge medications or the care you received while you were in the hospital after you are discharged, you can call the unit and asked to speak with the hospitalist on call if the hospitalist that took care of you is not available. Once you are discharged, your primary care physician will handle any further medical issues. Please note that NO REFILLS for any discharge medications will be authorized once you are discharged, as it is imperative that you return to your primary care physician (or establish a relationship with a primary care physician if you do not have one) for your aftercare needs so that they can reassess your need for medications and monitor your lab values.  Discharge Instructions    Diet - low sodium heart healthy    Complete by:  As directed      Increase activity slowly    Complete by:  As directed           Current Discharge Medication List    CONTINUE these medications which have NOT CHANGED   Details  albuterol (PROVENTIL HFA;VENTOLIN HFA) 108 (90 BASE) MCG/ACT inhaler Inhale into the lungs every 6 (six) hours as needed for wheezing or shortness of breath.    allopurinol (ZYLOPRIM) 100 MG tablet Take 200 mg by mouth daily.    beclomethasone (QVAR) 80 MCG/ACT inhaler Inhale 1 puff into the lungs as needed (for shortness of breath).    carvedilol (COREG)  25 MG tablet Take 25 mg by mouth every evening.    Cholecalciferol 1000 UNITS capsule Take 1,000 Units by mouth daily.    cinacalcet (SENSIPAR) 30 MG tablet Take 30 mg by mouth daily.    felodipine (PLENDIL) 5 MG 24 hr tablet Take 5 mg by mouth daily.    folic acid (FOLVITE) A999333 MCG tablet Take 400 mcg by mouth daily.    Lacosamide (VIMPAT) 150 MG TABS Take 150 mg by mouth 2 (two) times daily. Takes extra tab on Tues, Thurs, and Sat    levETIRAcetam (KEPPRA) 500 MG tablet Take 500 mg by mouth 2 (two) times daily. Takes  extra with HD    omeprazole (PRILOSEC) 20 MG capsule Take 20 mg by mouth daily.    sevelamer carbonate (RENVELA) 800 MG tablet Take 2,400 mg by mouth 3 (three) times daily with meals.       Allergies  Allergen Reactions  . Codeine Nausea And Vomiting   Follow-up Information    Follow up with Thresa Ross, MD In 2 weeks.   Specialty:  Cardiology       The results of significant diagnostics from this hospitalization (including imaging, microbiology, ancillary and laboratory) are listed below for reference.    Significant Diagnostic Studies: Dg Chest 2 View  10/25/2014   CLINICAL DATA:  Initial encounter for chest pain after dialysis today.  EXAM: CHEST  2 VIEW  COMPARISON:  None.  FINDINGS: The lungs are clear without focal infiltrate, edema, pneumothorax or pleural effusion. Interstitial markings are diffusely coarsened with chronic features. Cardiopericardial silhouette is at upper limits of normal for size. Imaged bony structures of the thorax are intact.  IMPRESSION: No active cardiopulmonary disease.   Electronically Signed   By: Misty Stanley M.D.   On: 10/25/2014 21:23    Microbiology: Recent Results (from the past 240 hour(s))  MRSA PCR Screening     Status: None   Collection Time: 10/26/14  4:20 AM  Result Value Ref Range Status   MRSA by PCR NEGATIVE NEGATIVE Final    Comment:        The GeneXpert MRSA Assay (FDA approved for NASAL specimens only), is one component of a comprehensive MRSA colonization surveillance program. It is not intended to diagnose MRSA infection nor to guide or monitor treatment for MRSA infections.      Labs: Basic Metabolic Panel:  Recent Labs Lab 10/25/14 2022 10/26/14 0600  NA 140 142  K 4.9 4.9  CL 93* 96  CO2 29 30  GLUCOSE 81 118*  BUN 33* 40*  CREATININE 8.56* 9.60*  CALCIUM 9.9 9.6   Liver Function Tests: No results for input(s): AST, ALT, ALKPHOS, BILITOT, PROT, ALBUMIN in the last 168 hours. No results for  input(s): LIPASE, AMYLASE in the last 168 hours. No results for input(s): AMMONIA in the last 168 hours. CBC:  Recent Labs Lab 10/25/14 2022 10/26/14 0600  WBC 6.5 5.1  NEUTROABS 4.0  --   HGB 9.7* 9.5*  HCT 29.9* 29.4*  MCV 96.1 98.0  PLT 203 176   Cardiac Enzymes:  Recent Labs Lab 10/25/14 2311 10/26/14 0600  TROPONINI <0.30 <0.30   BNP: BNP (last 3 results) No results for input(s): PROBNP in the last 8760 hours. CBG: No results for input(s): GLUCAP in the last 168 hours.     Signed:  Charlynne Cousins  [facility] 10/26/2014, 8:48 AM

## 2014-10-26 NOTE — Plan of Care (Signed)
Problem: Phase I Progression Outcomes Goal: Hemodynamically stable Outcome: Completed/Met Date Met:  10/26/14 Goal: Anginal pain relieved Outcome: Completed/Met Date Met:  10/26/14 Goal: Aspirin unless contraindicated Outcome: Completed/Met Date Met:  10/26/14 Goal: MD aware of Cardiac Marker results Outcome: Completed/Met Date Met:  10/26/14 Goal: Voiding-avoid urinary catheter unless indicated Outcome: Completed/Met Date Met:  10/26/14 Goal: Other Phase I Outcomes/Goals Outcome: Not Applicable Date Met:  78/67/67  Problem: Phase II Progression Outcomes Goal: Hemodynamically stable Outcome: Completed/Met Date Met:  10/26/14 Goal: Anginal pain relieved Outcome: Completed/Met Date Met:  10/26/14 Goal: Stress Test if indicated Outcome: Not Applicable Date Met:  20/94/70 Goal: Cath/PCI Day Path if indicated Outcome: Not Applicable Date Met:  96/28/36 Goal: CV Risk Factors identified Outcome: Completed/Met Date Met:  10/26/14 Goal: Cardiac Rehab if ordered Outcome: Not Applicable Date Met:  62/94/76 Goal: If positive for MI, change to MI Path Outcome: Not Applicable Date Met:  54/65/03 Goal: Other Phase II Outcomes/Goals Outcome: Not Applicable Date Met:  54/65/68  Problem: Phase III Progression Outcomes Goal: Hemodynamically stable Outcome: Completed/Met Date Met:  10/26/14 Goal: No anginal pain Outcome: Completed/Met Date Met:  10/26/14 Goal: Cath/PCI Path as indicated Outcome: Not Applicable Date Met:  12/75/17 Goal: Vascular site scale level 0 - I Vascular Site Scale Level 0: No bruising/bleeding/hematoma Level I (Mild): Bruising/Ecchymosis, minimal bleeding/ooozing, palpable hematoma < 3 cm Level II (Moderate): Bleeding not affecting hemodynamic parameters, pseudoaneurysm, palpable hematoma > 3 cm Level III (Severe) Bleeding which affects hemodynamic parameters or retroperitoneal hemorrhage  Outcome: Not Applicable Date Met:  00/17/49 Goal: Discharge plan remains  appropriate-arrangements made Outcome: Completed/Met Date Met:  10/26/14 Goal: Tolerating diet Outcome: Completed/Met Date Met:  10/26/14 Goal: If positive for MI, change to MI Path Outcome: Not Applicable Date Met:  44/96/75 Goal: Other Phase III Outcomes/Goals Outcome: Not Applicable Date Met:  91/63/84  Problem: Discharge Progression Outcomes Goal: No anginal pain Outcome: Completed/Met Date Met:  10/26/14 Goal: Hemodynamically stable Outcome: Completed/Met Date Met:  66/59/93 Goal: Complications resolved/controlled Outcome: Not Applicable Date Met:  57/01/77 Goal: Barriers To Progression Addressed/Resolved Outcome: Completed/Met Date Met:  10/26/14 Goal: Discharge plan in place and appropriate Outcome: Completed/Met Date Met:  10/26/14 Goal: Vascular site scale level 0 - I Vascular Site Scale Level 0: No bruising/bleeding/hematoma Level I (Mild): Bruising/Ecchymosis, minimal bleeding/ooozing, palpable hematoma < 3 cm Level II (Moderate): Bleeding not affecting hemodynamic parameters, pseudoaneurysm, palpable hematoma > 3 cm Level III (Severe) Bleeding which affects hemodynamic parameters or retroperitoneal hemorrhage  Outcome: Not Applicable Date Met:  93/90/30 Goal: Tolerates diet Outcome: Completed/Met Date Met:  10/26/14 Goal: Activity appropriate for discharge plan Outcome: Completed/Met Date Met:  10/26/14 Goal: Other Discharge Outcomes/Goals Outcome: Not Applicable Date Met:  08/09/29

## 2014-10-26 NOTE — Consult Note (Signed)
CONSULT NOTE  Date: 10/26/2014               Patient Name:  Jeremy Rose MRN: [id number]  DOB: 10-17-1956 Age / Sex: 58 y.o., male        PCP: Pcp Not In System Primary Cardiologist: Aram Beecham            Referring Physician: Venetia Constable              Reason for Consult: Chest pain           History of Present Illness: Patient is a 58 y.o. male with a PMHx of ESRD, CP , who was admitted to [facility] on 10/25/2014 for evaluation of chest pain .   The pain is  midsternal ., does not radiate to arm or neck.  He describes it as pleuretic, worsened with deep breath,   lasted several hours  started while he was working outside, raking leaves  worse with bending over, taking a deep breath  eased some if he sits quietly.    Pt has had CP in the past.  Sees Dr. Chancy Milroy in [place].  Has had a cath - no intervention was done.  He has eaten breakfast this am.  Medications: Outpatient medications: Prescriptions prior to admission  Medication Sig Dispense Refill Last Dose  . albuterol (PROVENTIL HFA;VENTOLIN HFA) 108 (90 BASE) MCG/ACT inhaler Inhale into the lungs every 6 (six) hours as needed for wheezing or shortness of breath.   not used  . allopurinol (ZYLOPRIM) 100 MG tablet Take 200 mg by mouth daily.   10/25/2014 at Unknown time  . beclomethasone (QVAR) 80 MCG/ACT inhaler Inhale 1 puff into the lungs as needed (for shortness of breath).   not used  . carvedilol (COREG) 25 MG tablet Take 25 mg by mouth every evening.   10/25/2014 at 1800  . Cholecalciferol 1000 UNITS capsule Take 1,000 Units by mouth daily.   10/25/2014 at Unknown time  . cinacalcet (SENSIPAR) 30 MG tablet Take 30 mg by mouth daily.   10/25/2014 at Unknown time  . felodipine (PLENDIL) 5 MG 24 hr tablet Take 5 mg by mouth daily.   10/25/2014 at Unknown time  . folic acid (FOLVITE) A999333 MCG tablet Take 400 mcg by mouth daily.   10/25/2014 at Unknown time  . Lacosamide (VIMPAT) 150 MG TABS Take 150 mg by mouth 2  (two) times daily. Takes extra tab on Tues, Thurs, and Sat   10/25/2014 at Unknown time  . levETIRAcetam (KEPPRA) 500 MG tablet Take 500 mg by mouth 2 (two) times daily. Takes extra with HD   10/25/2014 at Unknown time  . omeprazole (PRILOSEC) 20 MG capsule Take 20 mg by mouth daily.   10/25/2014 at Unknown time  . sevelamer carbonate (RENVELA) 800 MG tablet Take 2,400 mg by mouth 3 (three) times daily with meals.   10/25/2014 at Unknown time    Current medications: Current Facility-Administered Medications  Medication Dose Route Frequency Provider Last Rate Last Dose  . 0.9 %  sodium chloride infusion  250 mL Intravenous PRN Theressa Millard, MD      . acetaminophen (TYLENOL) tablet 650 mg  650 mg Oral Q6H PRN Theressa Millard, MD       Or  . acetaminophen (TYLENOL) suppository 650 mg  650 mg Rectal Q6H PRN Theressa Millard, MD      . allopurinol (ZYLOPRIM) tablet 200 mg  200 mg Oral Daily  Theressa Millard, MD      . aspirin EC tablet 325 mg  325 mg Oral Daily Theressa Millard, MD      . carvedilol (COREG) tablet 25 mg  25 mg Oral QPM Theressa Millard, MD      . cholecalciferol (VITAMIN D) tablet 1,000 Units  1,000 Units Oral Daily Theressa Millard, MD      . cinacalcet (SENSIPAR) tablet 30 mg  30 mg Oral Q breakfast Theressa Millard, MD      . felodipine (PLENDIL) 24 hr tablet 5 mg  5 mg Oral Daily Theressa Millard, MD   5 mg at 10/26/14 0212  . folic acid (FOLVITE) tablet 0.5 mg  400 mcg Oral Daily Theressa Millard, MD      . heparin injection 5,000 Units  5,000 Units Subcutaneous 3 times per day Theressa Millard, MD   5,000 Units at 10/26/14 0601  . HYDROmorphone (DILAUDID) injection 0.5-1 mg  0.5-1 mg Intravenous Q3H PRN Theressa Millard, MD      . lacosamide (VIMPAT) tablet 150 mg  150 mg Oral BID Theressa Millard, MD   150 mg at 10/26/14 0210  . levETIRAcetam (KEPPRA) tablet 500 mg  500 mg Oral BID Theressa Millard, MD   500 mg at 10/26/14 0211  . nitroGLYCERIN  (NITROGLYN) 2 % ointment 1 inch  1 inch Topical 4 times per day Theressa Millard, MD   1 inch at 10/26/14 0603  . ondansetron (ZOFRAN) tablet 4 mg  4 mg Oral Q6H PRN Theressa Millard, MD       Or  . ondansetron (ZOFRAN) injection 4 mg  4 mg Intravenous Q6H PRN Theressa Millard, MD      . pantoprazole (PROTONIX) EC tablet 40 mg  40 mg Oral Daily Harvette Evonnie Dawes, MD      . sevelamer carbonate (RENVELA) tablet 2,400 mg  2,400 mg Oral TID WC Harvette Evonnie Dawes, MD      . sodium chloride 0.9 % injection 3 mL  3 mL Intravenous Q12H Theressa Millard, MD   3 mL at 10/26/14 0212  . sodium chloride 0.9 % injection 3 mL  3 mL Intravenous Q12H Theressa Millard, MD   0 mL at 10/26/14 0212  . sodium chloride 0.9 % injection 3 mL  3 mL Intravenous PRN Theressa Millard, MD         Allergies  Allergen Reactions  . Codeine Nausea And Vomiting     Past Medical History  Diagnosis Date  . Coronary artery disease   . Hypertension   . Renal disorder   . Dialysis patient     T, TH, S  . Seizures     Past Surgical History  Procedure Laterality Date  . Av fistula placement Left   . Cardiac catheterization      History reviewed. No pertinent family history.  Social History:  reports that he has quit smoking. He has quit using smokeless tobacco. He reports that he does not drink alcohol or use illicit drugs.   Review of Systems: Constitutional:  denies fever, chills, diaphoresis, appetite change and fatigue.  HEENT: denies photophobia, eye pain, redness, hearing loss, ear pain, congestion, sore throat, rhinorrhea, sneezing, neck pain, neck stiffness and tinnitus.  Respiratory: denies SOB, DOE, cough, chest tightness, and wheezing.  Cardiovascular: admits to chest pain,  Chest soreness  Gastrointestinal: denies nausea, vomiting, abdominal pain, diarrhea, constipation, blood in stool.  Genitourinary: denies dysuria, urgency, frequency, hematuria, flank pain and difficulty urinating.    Musculoskeletal: denies  myalgias, back pain, joint swelling, arthralgias and gait problem.   Skin: denies pallor, rash and wound.  Neurological: denies dizziness, seizures, syncope, weakness, light-headedness, numbness and headaches.   Hematological: denies adenopathy, easy bruising, personal or family bleeding history.  Psychiatric/ Behavioral: denies suicidal ideation, mood changes, confusion, nervousness, sleep disturbance and agitation.    Physical Exam: BP 125/70 mmHg  Pulse 74  Temp(Src) 98.5 F (36.9 C) (Oral)  Resp 20  Ht 5\' 8"  (1.727 m)  Wt 169 lb 1.6 oz (76.703 kg)  BMI 25.72 kg/m2  SpO2 96%  Wt Readings from Last 3 Encounters:  10/26/14 169 lb 1.6 oz (76.703 kg)    General: Vital signs reviewed and noted. Well-developed, well-nourished, in no acute distress; alert,   Head: Normocephalic, atraumatic, sclera anicteric,   Neck: Supple. Negative for carotid bruits. No JVD   Lungs:  Clear bilaterally, no  wheezes, rales, or rhonchi. Breathing is normal   Heart: RRR with S1 S2. No murmurs, rubs, or gallops  + chest wall tenderness.   Abdomen:  Soft, non-tender, non-distended with normoactive bowel sounds. No hepatomegaly. No rebound/guarding. No obvious abdominal masses   MSK: Strength and the appear normal for age.   Extremities: No clubbing or cyanosis. No edema.  Distal pedal pulses are 2+ and equal   Neurologic: Alert and oriented X 3. Moves all extremities spontaneously.  Psych: Responds to questions appropriately with a normal affect.     Lab results: Basic Metabolic Panel:  Recent Labs Lab 10/25/14 2022 10/26/14 0600  NA 140 142  K 4.9 4.9  CL 93* 96  CO2 29 30  GLUCOSE 81 118*  BUN 33* 40*  CREATININE 8.56* 9.60*  CALCIUM 9.9 9.6    Liver Function Tests: No results for input(s): AST, ALT, ALKPHOS, BILITOT, PROT, ALBUMIN in the last 168 hours. No results for input(s): LIPASE, AMYLASE in the last 168 hours. No results for input(s): AMMONIA in the  last 168 hours.  CBC:  Recent Labs Lab 10/25/14 2022 10/26/14 0600  WBC 6.5 5.1  NEUTROABS 4.0  --   HGB 9.7* 9.5*  HCT 29.9* 29.4*  MCV 96.1 98.0  PLT 203 176    Cardiac Enzymes:  Recent Labs Lab 10/25/14 2311 10/26/14 0600  TROPONINI <0.30 <0.30    BNP: Invalid input(s): POCBNP  CBG: No results for input(s): GLUCAP in the last 168 hours.  Coagulation Studies: No results for input(s): LABPROT, INR in the last 72 hours.   Other results:  EKG:  NSR , no ST or T wave changes.    Imaging: Dg Chest 2 View  10/25/2014   CLINICAL DATA:  Initial encounter for chest pain after dialysis today.  EXAM: CHEST  2 VIEW  COMPARISON:  None.  FINDINGS: The lungs are clear without focal infiltrate, edema, pneumothorax or pleural effusion. Interstitial markings are diffusely coarsened with chronic features. Cardiopericardial silhouette is at upper limits of normal for size. Imaged bony structures of the thorax are intact.  IMPRESSION: No active cardiopulmonary disease.   Electronically Signed   By: Misty Stanley M.D.   On: 10/25/2014 21:23       Assessment & Plan:  1. Chest pain :  His pain seems to be most consistent with musculoskeletal pain .  The pain worsens with palpitation and deep breath.  Started while he was raking leaves.  ECG is normal. Troponins are normal  He  has had a cath in the past that was unremarkable.  I think that he is at low risk and may be discharged to home. He should see Dr. Chancy Milroy in his office is a week or so.   2. ESRD:   To be dialyzed today .  No further evaluation needed here  Ramond Dial., MD, [facility] 10/26/2014, 8:28 AM Office - [phone] Pager [phone]

## 2014-10-27 DIAGNOSIS — D509 Iron deficiency anemia, unspecified: Secondary | ICD-10-CM | POA: Insufficient documentation

## 2014-12-16 DIAGNOSIS — R519 Headache, unspecified: Secondary | ICD-10-CM | POA: Insufficient documentation

## 2014-12-16 DIAGNOSIS — R509 Fever, unspecified: Secondary | ICD-10-CM | POA: Insufficient documentation

## 2014-12-16 DIAGNOSIS — L299 Pruritus, unspecified: Secondary | ICD-10-CM | POA: Insufficient documentation

## 2014-12-16 DIAGNOSIS — R0602 Shortness of breath: Secondary | ICD-10-CM | POA: Insufficient documentation

## 2015-03-06 NOTE — Op Note (Signed)
PATIENT NAME:  Jeremy Rose, Jeremy Rose MR#:  [id number] DATE OF BIRTH:  Sep 24, 1956  DATE OF PROCEDURE:  09/17/2012  PREOPERATIVE DIAGNOSIS: End-stage renal disease requiring hemodialysis.   POSTOPERATIVE DIAGNOSIS: End-stage renal disease requiring hemodialysis.  PROCEDURE PERFORMED: Creation of a left radiocephalic fistula.   SURGEON: Katha Cabal, MD   ANESTHESIA: MAC.   ESTIMATED BLOOD LOSS: Minimal.   SPECIMEN: None.   INDICATIONS: [name] is a 59 year old gentleman who has progressed to requiring dialysis and now needs upper extremity access. He has a cephalic vein which will be adequate for fistula creation and is therefore undergoing a radiocephalic fistula on the left. The risks and benefits were reviewed with the patient. All questions have been answered. The patient agrees to proceed.   DESCRIPTION OF PROCEDURE: The patient is taken to the Operating Room and placed in the supine position. After adequate sedation is achieved, the left arm is prepped and draped in sterile fashion. Using a surgical marker, radial impulse as well as cephalic vein are identified and marked on the skin at the level of the wrist. Incision is then created midway between these landmarks after 0.25% Marcaine is infiltrated in the soft tissues. The cephalic vein is dissected circumferentially taking side branches down with 3-0 silk ties and dividing them. The radial artery is then identified. It is looped proximally and distally. Small side branches are also looped with 3-0 silk ties.   The cephalic vein is then marked with a surgical marker to maintain orientation. It is clamped distally with a right angle. It is then transected with tenotomy scissors. A silk tie is used to occlude the distal vein. The vein is then dilated using [facility] coronary dilators up to 4 mm. It is then flushed with heparinized saline and is approximated to the artery in its native bed, trimmed and beveled slightly. Arteriotomy is  then made after hemostasis is achieved with Silastic vessel loops. It is extended with Potts scissors, and a vein-to-side radial artery anastomosis is then fashioned with running 6-0 Prolene. Flushing maneuvers were performed and flow is established through the fistula. Soft thrill is noted. A large side branch that courses down the dorsum of the hand is identified, and a small incision is made overlying this branch, this branch is ligated and divided with silk ties.   Both wounds are inspected for hemostasis, irrigated and then closed in layers using interrupted 3-0 Vicryl followed by 4-0 Monocryl subcuticular and then Dermabond. The patient tolerated the procedure well, and there were no immediate complications. Sponge and needle counts were correct, and he was taken to the recovery area in stable condition.   ____________________________ Katha Cabal, MD ggs:cbb D: 09/17/2012 15:39:17 ET T: 09/17/2012 18:06:14 ET JOB#: [id number]  cc: Katha Cabal, MD, <Dictator> Katha Cabal MD ELECTRONICALLY SIGNED 09/28/2012 12:13

## 2015-03-09 NOTE — H&P (Signed)
PATIENT NAME:  Jeremy Rose, Jeremy Rose MR#:  [id number] DATE OF BIRTH:  05/17/1956  DATE OF ADMISSION:  03/18/2013  ADMITTING PHYSICIAN:  Gladstone Lighter.   PRIMARY CARE PHYSICIAN:  [facility].  PRIMARY NEPHROLOGIST:  [facility] neph[facility].   CHIEF COMPLAINT:  Sent in from dialysis center for difficulty accessing his fistula.   HISTORY OF PRESENT ILLNESS:  The patient is a 59 year old male with past medical history significant for end-stage renal disease, has been on hemodialysis since November 2013; hypertension, arthritis, gout, history of seizure disorder, who had last dialysis on Saturday, which was 03/12/2013. He could not go to his dialysis on Tuesday and Thursday because he was feeling sick, and actually had to see his PCP. He was started on Levaquin antibiotic for possible bronchitis/pneumonia at the time, and he went for dialysis today, and within 8 minutes after starting the dialysis, they were not able to access his fistula. Because of underlying pulmonary edema, difficulty breathing, he was sent in to the hospital for urgent hemodialysis.   PAST MEDICAL HISTORY: 1.  Hypertension.  2.  End-stage renal disease on hemodialysis, started since November 2013, currently has a left forearm AV fistula.  3.  Seizure disorder.  4.  Asthma.  5.  Arthritis.  6.  Gout.  PAST SURGICAL HISTORY: 1.  Perm-A-Cath placement and removal.  2.  Left forearm AV fistula placement.   ALLERGIES TO MEDICATIONS: INTOLERANT TO CODEINE.   CURRENT HOME MEDICATIONS:  1.  Allopurinol 100 mg p.o. b.i.d.  2.  Aspirin 81 mg p.o. daily.  3.  Coreg 25 mg p.o. b.i.d.  4.  Felodipine 5 mg p.o. daily.  5.  Folic acid 0.8 mg p.o. daily.  6.  Furosemide 20 mg p.o. b.i.d.  7.  Keppra 500 mg p.o. b.i.d.  8.  Minoxidil 2.5 mg p.o. daily.  9.  Multivitamin 1 tablet p.o. daily.  10.  Omeprazole 20 mg p.o. daily.  11.  Qvar inhaler 1 puff b.i.d.  12.  Rena-Vite tablet 1 tablet p.o. daily.  13.  Renvela 800 mg  tablets - 4 tablets 3 times a day with meals and 2 tablets with snacks.  14.  Tramadol 50 mg q.6 hours p.r.n. for pain.  15.  Ventolin inhaler 2 puffs 4 times a day as needed for difficulty breathing.  16.  Vitamin D, 3000 International Units daily.  48.  Levaquin was started on 03/16/2013, and he took 500 mg dose that day, and he is supposed to take 250 mg every 48 hours for 3 more doses.    SOCIAL HISTORY:  Lives at home by himself. Never married. No children. Has 5 sisters and 2 brothers, which kind of check on him. No smoking or alcohol use.   FAMILY HISTORY:  Dad with COPD, and he was an alcoholic and also a smoker, and mom died from cancer and also heart disease. She had cancer of her male organs.   REVIEW OF SYSTEMS:  CONSTITUTIONAL:  No fatigue, weakness or fevers.  EYES:  No blurred vision, double vision or glaucoma. Uses glasses.  ENT:  No tinnitus, ear pain, hearing loss, epistaxis or discharge.  RESPIRATORY:  No cough.  Positive for wheezing and history of asthma. No hemoptysis.  CARDIOVASCULAR:  No chest pain, orthopnea, edema, arrhythmia, palpitations or syncope.  GASTROINTESTINAL:  No nausea, vomiting, diarrhea, hematemesis or melena.  GENITOURINARY:  The patient still makes good urine in spite of being on dialysis. Denies any dysuria, hematuria, renal calculus or frequency.  ENDOCRINE:  No polyuria, nocturia, thyroid problems, heat or cold intolerance.  HEMATOLOGY:  Positive for anemia. No easy bruising or bleeding.  SKIN:  No acne, rash or lesions.  MUSCULOSKELETAL:  No neck, back, shoulder pain. Positive for arthritis and gout.  NEUROLOGIC:  No CVA, TIA or ataxia. Positive for history of seizures.   PSYCHOLOGICAL:  No anxiety, insomnia or depression.   PHYSICAL EXAMINATION: VITAL SIGNS:  Temperature is 98 degrees Fahrenheit, pulse 70, respirations 18, blood pressure 118/62, pulse ox 97% on room air.  GENERAL:  Well-built, well-nourished male, sitting in a dialysis  chair, not in any acute distress.  HEENT:  Normocephalic, atraumatic. Pupils equal, round, reacting to light. Anicteric sclerae. Extraocular movements intact. Oropharynx clear without erythema, mass or exudates. He is edentulous.  NECK:  Supple. No thyromegaly, JVD or carotid bruits. No lymphadenopathy.  LUNGS:  He has diffuse wheeze posteriorly with decreased bibasilar breath sounds and also some crackles, more prominent on the left side. No use of accessory muscles for breathing though.  CARDIOVASCULAR:  S1, S2 regular rate and rhythm. There is 3/6 systolic murmur in the precordial region. No rubs or gallops.  ABDOMEN:  Soft, nontender, nondistended. No hepatosplenomegaly. Normal bowel sounds.  EXTREMITIES:  No pedal edema. No clubbing or cyanosis, 2+ dorsalis pedis pulses palpable bilaterally.  SKIN:  No acne, rash or lesions, other than there is nicely functioning AV fistula in the left forearm.  LYMPHATICS:  No cervical lymphadenopathy.  NEUROLOGIC:  Cranial nerves are intact. No focal motor or sensory deficits. Strength is 5/5 all 4 extremities.  PSYCHOLOGICAL:  The patient is awake, alert, oriented x 3.   LABORATORY, DIAGNOSTIC AND RADIOLOGICAL DATA: WBC 3.4, hemoglobin 9.2, hematocrit 27.2, platelet count 141.   Sodium 137, potassium 4.7, chloride 99, bicarb 24, BUN 80, creatinine 20.64, glucose 76 and calcium of 9.5.   ALT 22, AST 10, alk phos 103, total bili 0.3, albumin of 3.5, troponin less than 0.02, potassium 4.7, phosphorus elevated at 8.7.   Chest x-ray showing mild interstitial edema, likely cardiac origin. No pleural effusion or alveolar infiltrate seen.   EKG showing normal sinus rhythm. No acute ST-T wave abnormalities and heart rate of 65.   ASSESSMENT AND PLAN:  This is a 59 year old male with past medical history significant for end-stage renal disease on hemodialysis, hypertension, seizure disorder, arthritis and gout, who missed his dialysis this Tuesday and Thursday,  went for dialysis today, but had trouble accessing his fistula, so was sent to the Emergency Room for urgent dialysis.  1.  End-stage renal disease on hemodialysis. Missed 2 sessions due to bronchitis and being sick. Today, dialysis center had trouble accessing his fistula, so he was sent over here, so he is taken for urgent dialysis and is appearing more stable.  The patient will have another session of dialysis tomorrow per his schedule, and if stable after that, should be able to go home. Nephrology has been notified. Continue his Rena-Vite and also Renvela medication.  2.  Asthma exacerbation with bronchitis. He has been having cough and difficulty breathing and went to see his PCP 2 days ago, started on Levaquin; however, I do hear significant wheezing on exam as well, so we will start IV Solu-Medrol along with neb treatments. Continue his Qvar at this time and continue Levaquin. He needs to finish off the course by taking 3 more pills.  3.  Hypertension.  Home medications to be continued. He is on minoxidil, Coreg, felodipine and Lasix.  4.  Seizure disorder. The patient is on Keppra.  5.  Anemia of chronic disease.  He gets Procrit during dialysis.  6.  Gastrointestinal and deep vein thrombosis prophylaxis. He is on Prilosec, and also subcu heparin.   CODE STATUS: Full code.   TIME SPENT ON ADMISSION: 50 minutes.     ____________________________ Gladstone Lighter, MD rk:dmm D: 03/18/2013 16:51:11 ET T: 03/18/2013 20:10:26 ET JOB#: [id number]  cc: Gladstone Lighter, MD, <Dictator> Dixie Gladstone Lighter MD ELECTRONICALLY SIGNED 03/31/2013 20:57

## 2015-03-09 NOTE — Consult Note (Signed)
PATIENT NAME:  Jeremy Rose, SCHAEFER MR#:  [id number] DATE OF BIRTH:  06/28/1956  DATE OF CONSULTATION:  03/18/2013  REFERRING PHYSICIAN:  Gladstone Lighter, MD CONSULTING PHYSICIAN:  Patrisha Hausmann Lilian Kapur, MD  REASON FOR CONSULTATION: Evaluation and management of end-stage renal disease and missed hemodialysis for several treatments.   HISTORY OF PRESENT ILLNESS: The patient is a pleasant 59 year old Caucasian male with a past medical history of end-stage renal disease on hemodialysis Tuesday, Thursday, Saturday, secondary hyperparathyroidism, anemia of chronic kidney disease, hypertension, GERD, seizure disorder, gout, left upper extremity AV fistula placement, who presented to [facility] today after infiltration of his access at the hemodialysis center. Apparently the patient has missed the last several hemodialysis treatments. He states that he missed these treatments due to what he presumed to be pneumonia. He only had treatment for 8 minutes and experienced infiltration of his left upper extremity AV fistula. He was then brought here for further evaluation. He reports to me that he has been on dialysis since November 2013. It is presumed that his end-stage renal disease is as a result of hypertension. His estimated dry weight is 80.5 kg. He also has associated anemia of chronic kidney disease, for which he receives Epogen. He also has secondary hyperparathyroidism and has high binder requirement. He reports to me that he takes Renvela 3200 mg p.o. t.i.d. with meals and also takes 1600 mg with snacks. The patient's BUN was noted to be quite high at 80 and creatinine was noted as being 20. He also has ongoing anemia with a hemoglobin of 9.2. The patient was subsequently admitted to reinitiate dialysis.   PAST MEDICAL HISTORY:  1.  End-stage renal disease on hemodialysis Tuesday, Thursday, Saturday. Followed by [facility] at the [facility]. Estimated dry weight 80.5 kg.   2.  Hypertension.  3.  Gout.  4.  GERD.  5.  Anemia of chronic kidney disease.  6.  Secondary hyperparathyroidism.  7.  Left upper extremity AV fistula placement   ALLERGIES: CODEINE.   CURRENT HOME MEDICATIONS: Include vitamin D3 at 1000 international units p.o. daily, Ventolin 2 puffs inhaled q.i.d., tramadol 50 mg p.o. q.6 hours, Renvela 3200 mg p.o. t.i.d. with meals and 1600 mg with snacks, Rena-Vite 1 tablet p.o. daily, QVAR 1 puff inhaled b.i.d., PhosLo (is listed as a home medication, however, patient not taking this at this point in time), omeprazole 20 mg p.o. daily, multivitamin 1 tablet p.o. daily, Minoxidil 2.5 mg p.o. daily, Keppra 500 mg p.o. b.i.d., furosemide 20 mg p.o. b.i.d., folic acid 0.8 mg p.o. daily, felodipine 5 mg p.o. daily, carvedilol 25 mg p.o. b.i.d., aspirin 81 mg daily, allopurinol 100 mg p.o. b.i.d.   SOCIAL HISTORY: The patient resides in [place]. He is single. He has no children. He used to work in [employer]. He currently denies tobacco, alcohol or illicit drug use.   FAMILY HISTORY: Mother died of some type of GYN cancer, patient unclear as to what type exactly. The patient's father also deceased and had a history of myocardial infarction. No family history of end-stage renal disease.   REVIEW OF SYSTEMS:    CONSTITUTIONAL: The patient denies fevers, chills, weight loss.  EYES: Denies diplopia, blurry vision.  HENT: Denies headaches, hearing loss. Denies epistaxis.  CARDIOVASCULAR: Denies chest pain, palpitations.  RESPIRATORY: Reports recent cough with sputum production but denies hemoptysis.  GASTROINTESTINAL: Denies nausea, vomiting, diarrhea, bloody stools.  GENITOURINARY: Denies frequency, urgency, dysuria. Has history of end-stage renal disease.  MUSCULOSKELETAL: Has history of gout.  INTEGUMENTARY: Denies skin rashes or lesions.  NEUROLOGIC: Denies focal extremity numbness, weakness or tingling.  PSYCHIATRIC: Denies depression,  bipolar disorder.  ENDOCRINE: Denies polyuria, polydipsia, polyphagia.  HEMATOLOGIC AND LYMPHATIC: Denies easy bruisability, bleeding, swollen lymph nodes. The patient has a history of anemia of chronic kidney disease.  ALLERGY AND IMMUNOLOGIC: Denies seasonal allergies or history of immunodeficiency.   PHYSICAL EXAMINATION:  VITAL SIGNS: Temperature 98, pulse 74, respirations 18, blood pressure 119/73, pulse oximetry 98% on room air.  GENERAL: Well-developed, well-nourished Caucasian male who appears his stated age, currently in no acute distress.  HEENT: Normocephalic, atraumatic. Extraocular are intact. Pupils equal, round and reactive to light. No scleral icterus. Conjunctivae are pink. No epistaxis noted. Gross hearing intact. Oral mucosa moist.  NECK: Supple and without lymphadenopathy.  LUNGS: Clear to auscultation bilaterally with normal respiratory effort.  HEART: S1, S2, regular rate and rhythm. No murmurs, rubs or gallops appreciated.  ABDOMEN: Soft, nontender, nondistended. Bowel sounds positive. No rebound or guarding. No gross organomegaly appreciated.  EXTREMITIES: No clubbing or cyanosis noted, 1+ bilateral lower extremity edema noted.  NEUROLOGIC: The patient is alert and oriented to time, person and place. Strength is 5 out of 5 in both upper and lower extremities. Sensation is intact.  SKIN: Warm and dry. No rashes noted.  MUSCULOSKELETAL: No joint redness, swelling or tenderness appreciated.  GENITOURINARY: No suprapubic tenderness noted at this time.  PSYCHIATRIC: The patient is with appropriate affect and appears to have good insight into his current illness.   LABORATORY AND RADIOLOGICAL DATA: Phosphorus 8.7, potassium 4.7. CBC shows WBC 3.5, hemoglobin 9.2, hematocrit 27, platelets 141. BMP shows sodium 137, potassium 4.7, chloride 99, CO2 of 24, BUN 80, creatinine 20, glucose 76. Troponin less than 0.02. Chest x-ray shows findings consistent with mild interstitial edema.    IMPRESSION AND PLAN: This is a 59 year old Caucasian male with past medical history of end-stage renal disease on hemodialysis Tuesday, Thursday, Saturday, followed at the [facility] and by [facility] with estimated dry weight of 80.5 kg; hypertension, anemia of chronic kidney disease, secondary hyperparathyroidism, seizure disorder, gout, gastroesophageal reflux disease, left upper extremity arteriovenous fistula, who presented to [facility] after having missed the last several outpatient hemodialysis sessions and also with infiltration of the left upper extremity arteriovenous fistula.  1.  End-stage renal disease on hemodialysis Tuesday, Thursday, Saturday. The patient has missed several outpatient treatments. Therefore, we will need to reinitiate dialysis slowly. We will perform a short hemodialysis treatment of 2-1/2 hours today. We will perform another dialysis treatment that will be slightly longer tomorrow. We will continue to monitor his metabolic parameters.  2.  Complication of dialysis device. The patient had infiltration of the left upper extremity arteriovenous fistula today. There was some associated swelling. Recommend ice pack after dialysis for this. However, we are currently using his fistula without issue. The infiltration should resolve slowly over time. If this worsens, we will consider consultation with vascular surgery.  3.  Anemia of chronic kidney disease. Hemoglobin noted to be 9.8. We will start the patient on Epogen with the next dialysis treatment.  4.  Secondary hyperparathyroidism. Phosphorus noted to be quite high, which is expected given the missed dialysis. We will continue to follow phosphorus. We will restart the patient on Renvela 4 tablets orally 3 times daily with meals and 2 tablets orally with snacks. We will monitor phosphorus serially over the hospitalization.   I would like  to thank Dr. Tressia Miners for this kind referral.  Further plan as the patient progresses.  ____________________________ Tama High, MD [email or web address] D: 03/18/2013 16:36:46 ET T: 03/18/2013 17:24:36 ET JOB#: [id number]  cc: Tama High, MD, <Dictator> Mariah Milling Affan Callow MD ELECTRONICALLY SIGNED 03/18/2013 21:11

## 2015-03-09 NOTE — H&P (Signed)
PATIENT NAME:  Jeremy Rose, Jeremy Rose MR#:  [id number] DATE OF BIRTH:  04-11-1956  DATE OF ADMISSION:  07/05/2013  PRIMARY CARE PHYSICIAN: Dr. Freddy Finner.   REFERRING PHYSICIAN: Dr. Delman Kitten.   CHIEF COMPLAINT: Numbness of the right upper extremity, slurred speech and inability to walk.   HISTORY OF PRESENT ILLNESS: This is a very nice 59 year old gentleman who I have met on a previous hospitalizations. He has a history of end-stage renal disease on hemodialysis Tuesday, Thursday and Saturday, hypertension, anemia of chronic disease, GERD, gout, seizure disorder, hyperparathyroidism, who comes today with a history of having a numbness episode that lasted for about 20 minutes. It happened around 11:00 a.m. today. That episode actually was on his right upper extremity. The patient states that he tried to get up and could not walk. He was going upstairs and could not do it.  He needed full assistance to get into the car and getting out of the car today. He was not able to talk very well.  His slurred speech resolved as well within 20 minutes. He felt like his head was foggy and after the symptoms away in 20 minutes, he did not have any residual effects or residual symptoms.   The patient apparently has been recently discharged from [facility]. He was admitted there for 17 days.  Changes were done to his medications because he was having seizures, 6 to 7 seizure episodes a day, apparently. The patient overall, he states that he is in his normal state of health, not having any other medical problems. He denies any chest pain, any fever, chills. He states that he did not have any headache today.   The patient is evaluated in the ER, his lab work is fine. His CT scan of the head did not show any abnormalities. He is going to be admitted for observation.   REVIEW OF SYSTEMS:  A 12-system review of systems is done.   CONSTITUTIONAL: No fever, fatigue, weakness, weight loss or weight gain. The weakness only  happened today, temporary for 20 minutes but he overall does not have any baseline weakness.  EYES: No blurry vision, double vision or, inflammation.  EARS, NOSE, THROAT: No tinnitus. No difficulty swallowing. No postnasal drip.   RESPIRATORY: Positive mild cough which is chronic. No wheezing. No hemoptysis and no painful respirations. 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  CARDIOVASCULAR: No chest pain, orthopnea, syncope or palpitations.  GASTROINTESTINAL: No nausea, vomiting, abdominal pain, constipation, diarrhea.  GENITOURINARY: No dysuria or hematuria. The patient produces very small amounts of urine with his dialysis. No prostatitis.  ENDOCRINE: No polyuria, polydipsia, polyphagia, cold or heat intolerance.  HEMATOLOGIC AND LYMPHATIC: Positive anemia of chronic disease. SKIN: No rashes, petechiae or moles.  MUSCULOSKELETAL: No significant neck pain, back pain. No gout.  NEUROLOGIC: No numbness, tingling at this moment, but he had numbness and tingling of the right upper extremity, weakness of the legs and foggy headache. No previous CVAs. Positive seizure disorder.  PSYCHIATRIC: No significant anxiety, insomnia or nervousness.   PAST MEDICAL HISTORY: 1.  End-stage renal disease hemodialysis Tuesday, Thursday, Saturday.  2.  Seizure disorder.  3.  Asthma.  4.  COPD.  5.  Anemia of chronic disease.  6.  Secondary hypoparathyroidism.  7.  GERD.  8.  Hypertension.  9.  Gout.   PAST SURGICAL HISTORY:  AV fistula placement and ankle surgery.   ALLERGIES: CODEINE.   SOCIAL HISTORY: The patient lives by himself. He has never been  married. He has five sisters two brothers that help with his care occasionally when he needs it.  He is very independent, does not drink, does not smoke, does not use drugs.   FAMILY HISTORY: Positive for COPD in his father and his mother had an MI. Denies any cancer in the family.   CURRENT MEDICATIONS: Vitamin D3 1000 units once daily, vimpat150 mg 2  times daily,  QVAR 80 mcg 2 puffs twice daily, Proventil 90 mcg as needed for shortness of breath, omeprazole 20 mg once a day, Keppra 500 mg twice daily, 500 more after dialysis, furosemide 20 mg twice daily, folic acid 0.8 mg once a day, Famotidine 5 mg once daily, Coreg 25 mg 2 times daily, aspirin 81 mg once a day, allopurinol 100 mg 2 times daily.   PHYSICAL EXAMINATION: VITAL SIGNS: Blood pressure 173/92, pulse 75 respirations 18, temperature 98.5, oxygen saturation 99% on room air.  GENERAL: Alert and oriented x3, in no acute distress. No respiratory distress. Hemodynamically stable.  HEENT: Pupils are equal and reactive. Extraocular movements are intact. Mucosa are moist. Anicteric sclerae. Pink conjunctivae. No oral lesions. No oropharyngeal exudates.  NECK: Supple. No JVD. No thyromegaly. No adenopathy. No carotid bruits.  CARDIOVASCULAR: Regular rate and rhythm. No murmurs, rubs or gallops are appreciated. No displacement of PMI. No tenderness on palpation anterior chest wall.  LUNGS: Clear without any wheezing or crepitus. No use of accessory muscles.  ABDOMEN: Soft, nontender, nondistended. No hepatosplenomegaly. No masses. Bowel sounds are positive.  EXTREMITIES: No edema, cyanosis or clubbing.  MUSCULOSKELETAL: No significant joint deformity or joint effusions. No erythema or double joints.  SKIN: No rashes, petechiae or new lesions.  LYMPHATIC: Negative for lymphadenopathy in the neck or supraclavicular areas.  VASCULAR: Good pulses. Capillary refill less than three. Pulses +2.  NEUROLOGIC: Cranial nerves II through XII intact. DTRs symmetric +2 in both extremities. Strength is 5 out of 5 in all 4 extremities. The patient has no drift of the upper extremities whenever asked to hold them up.  Rapid alternating movements seem to be normal. Coordination seems to be normal. Finger-to-nose seems to be normal.  PSYCHIATRIC: Mood is normal without any signs of agitation.   LABORATORY DATA:  Creatinine is 12, glucose is 85, sodium is 134. LFTs within normal limits.   White count is 6.1, hemoglobin is 11.8, platelets 160. Urinalysis normal.   EKG: Normal sinus rhythm.  CT scan of the head: No signs of acute abnormalities.   ASSESSMENT AND PLAN: A 59 year old gentleman with a history of end-stage renal disease, hypertension, seizure disorder, asthma, anemia, and chronic kidney disease, comes today with slurred speech, weakness and numbness of the right upper extremity and bilateral lower extremity weakness.   1.  Evaluation for transient ischemic attack. The patient is admitted for evaluation with MRI, echocardiogram and an ultrasound of the carotid arteries. 2.  The patient seems to have also severe, seizure disorder. I do not think at this moment he is evidencing any seizures. He was actually seen by [facility] last month, and his medications have been changed. This could be also a reaction of his medications. We are going to monitor closely.  3.  The patient is going to be admitted under telemetry monitor and neuro checks. The patient is able to eat without any concerns of aspirations. I personally evaluated his swallowing.  4.  He is going to have an echocardiogram, as well. 5.  Other than that the patient is doing okay. He is  going to be admitted for followup of care. Statin and aspirin started on the patient.  Holding blood pressure medications and allow permissive hypertension. Treat blood pressures that are above 190.  6.  Seizure disorder: The patient to continue Keppra. No active seizures at this moment.  7.  Asthma and chronic obstructive pulmonary disease. Continue current medications. P.r.n. nebs.  No signs of exacerbations.  8.   End-stage renal disease. Nephrology consulted for hemodialysis.  9.  Gout. No signs of exacerbation.   10.  Other medical problems are stable.  11.  Gastrointestinal prophylaxis with PPI, deep vein thrombosis prophylaxis with heparin.   TIME SPENT: I  spent about 50 minutes with this patient today.    HE IS A FULL CODE.     ____________________________ Adamsville Sink, MD [id number] D: 07/05/2013 13:49:34 ET T: 07/05/2013 15:35:11 ET JOB#: [id number]  cc: Perryman Sink, MD, <Dictator> Fredonia Casalino America Brown MD ELECTRONICALLY SIGNED 07/15/2013 12:35

## 2015-03-09 NOTE — H&P (Signed)
PATIENT NAME:  Jeremy Rose, Jeremy Rose MR#:  [id number] DATE OF BIRTH:  Dec 21, 1955  DATE OF ADMISSION:  04/09/2013  DATE OF HISTORY AND PHYSICAL:  04/09/2013.  REFERRING PHYSICIAN:  Dr. Loletta Specter.  REASON FOR ADMISSION:  New herpes zoster infection, inability to dialyze in the Outpatient Dialysis Center.   HISTORY OF PRESENT ILLNESS:  The patient is a pleasant 59 year old Caucasian male with the past medical history of end-stage renal disease on hemodialysis Tuesday, Thursday, and Saturday, hypertension, anemia of chronic kidney disease, secondary hyperparathyroidism, seizure disorder, gout, GERD, a history of a left upper extremity AV fistula with recent complication of dialysis device, asthma, who presented to [facility] with new rash involving his upper abdomen and lower torso. The patient reports that he developed this rash on Monday of this week. He reported that he had small vesicles noted on Monday. He went to dialysis Tuesday, however, his illness did not progress very much. Thereafter, his symptoms worsened and he missed dialysis on Thursday. He called his Dialysis Center today and was advised to come to the Emergency Department; however, he went to the Dialysis Center first. They then sent the patient here for evaluation. It appears that he was diagnosed with herpes zoster as an outpatient and he appeared to have started antiretroviral therapy. However, he does not recall the name of the therapy. The lesions have not yet encrusted. Given that he has had active zoster and lesions are not encrusted, he cannot dialyze in the Outpatient Dialysis Center per staffing there. Therefore, he would require dialysis on an inpatient basis until the lesions are encrusted.   PAST MEDICAL HISTORY:  1.  End-stage renal disease on hemodialysis Tuesday, Thursday, Saturday.  2.  Seizure disorder.  3.  Asthma.  4.  Anemia of chronic kidney disease.  5.  Secondary hyperparathyroidism.  6.   Hypertension.  7.  Gout.  8.  Recent complication of dialysis device of the left upper extremity AV fistula.  9.  A history of left upper extremity AV fistula placement.   ALLERGIES:  CODEINE.  CURRENT INPATIENT MEDICATIONS:  Include: 1.  Vitamin D3 1000 international units p.o. daily. 2.  Ventolin HFA 90 mcg/inhalation 2 puffs inhaled q.i.d.  3.  Tramadol 50 mg p.o. q.6 hours. 4.  Renvela  800 mg p.o. t.i.d.  4.  Rena-Vite 1 tablet p.o. daily.  5.  Qvar 80 mcg 1 puff inhaled b.i.d.  6.  Prednisone taper.  7.  PhosLo 2001 mg p.o. t.i.d.  8.  Omeprazole 20 mg p.o. daily.  9.  Minoxidil 2.5 mg p.o. daily.  10.  Keppra 500 mg p.o. b.i.d.  11. Furosemide 20 mg p.o. b.i.d.  12.  Folic acid 0.8 mg p.o. daily.  13.  Felodipine 5 mg p.o. daily.  14.  Coreg 25 mg p.o. b.i.d.  15.  Aspirin 81 mg daily.  16.  Allopurinol 100 mg p.o. b.i.d.   SOCIAL HISTORY:  The patient resides by himself. He is unmarried. He has no children. He has 5 sisters and 2 brothers. No reported tobacco, alcohol, or illicit drug use.   FAMILY HISTORY:  The patient's father had COPD and also had a history of alcohol abuse. The patient's mother is deceased from unspecified cancer and also has history of heart disease.   REVIEW OF SYSTEMS: GENERAL:  Denies fevers, chills, weight loss.  EYES:  Denies diplopia, blurry vision.  HEENT: Denies headaches, hearing loss. Denies epistaxis.  CARDIOVASCULAR:  Denies chest pain, palpitations, PND.  RESPIRATORY:  Denies cough, shortness of breath, or hemoptysis.  GASTROINTESTINAL:  Denies nausea, vomiting, dysguesia, diarrhea.  GENITOURINARY:  Denies frequency, urgency, or dysuria.  MUSCULOSKELETAL:  Does have chest wall and back pain from his herpes infection.  INTEGUMENTARY:  Has herpes zoster lesions on the lower torso and upper abdomen extending to the back.  NEUROLOGIC:  Reports significant burning at herpes zoster lesions.  PSYCHIATRIC:  Denies depression, bipolar  disorder.  ENDOCRINE:  Denies polyuria, polydipsia.  HEMATOLOGIC AND LYMPHATIC:  Denies easy bruisability, bleeding, or swollen lymph nodes. Has history of anemia of chronic kidney disease. ALLERGY/IMMUNOLOGIC:  Denies seasonal allergies or history of immunodeficiency.   PHYSICAL EXAMINATION:  VITAL SIGNS:  Temperature 97.8, pulse 83, respirations 18, blood pressure 151/81.  GENERAL:  A well-developed, well-nourished Caucasian male currently in no acute distress.  HEENT:  Normocephalic, atraumatic. Extraocular movements are intact. Pupils equal, round, and reactive to light. No scleral icterus. Conjunctivae are pink. No epistaxis noted. Gross hearing intact. Oral mucosa moist.  NECK:  Supple without JVD or lymphadenopathy.  LUNGS:  Clear to auscultation bilaterally with normal respiratory effort.  HEART:  S1, S2 regular rate and rhythm. No murmurs, rubs, or gallops appreciated.  ABDOMEN:  Soft, nontender, nondistended. Bowel sounds positive, no rebound or guarding. No gross organomegaly appreciated.  EXTREMITIES:  No clubbing or cyanosis noted. Trace lower extremity edema noted.  NEUROLOGIC:  The patient is awake, alert, and oriented to time, person, and place. Strength is 5/5 in both upper and lower extremity.  SKIN:  Demonstrates herpes zoster lesions involving at T7-T8 dermatome. These lesions start anteriorly and extend to the back. These lesions are primarily erythematous and some of the lesions show vesicles. No significant encrusting thus far.  GENITOURINARY:  No suprapubic tenderness. MUSCULOSKELETAL:  No joint redness, swelling or tenderness appreciated.  PSYCHIATRIC:  The patient with appropriate affect and appears to have good insight into his current illness.   LABORATORY DATA:  Sodium 141, potassium 4.1, chloride 105, CO2 24, BUN 60, creatinine 14, glucose 82. CBC shows WBC of 4.4, hemoglobin 9.5, hematocrit 28, platelets of 117.  IMPRESSION:  This is a 59 year old Caucasian male  with past medical history of end-stage renal disease on hemodialysis Tuesday, Thursday, and Saturday, followed at the [facility], seizure disorder, asthma, anemia of chronic kidney disease, secondary hyperparathyroidism, hypertension, gout, recent complication of dialysis device, who presented to [facility] with new onset herpes zoster infection. The patient unable to obtain outpatient dialysis as he has active herpes zoster infection. 1.  Herpes zoster infection. The patient appears to have involvement of the dermatome T7-T8. The patient has some vesicular lesions but the lesions are not yet encrusted. These lesions are considered active, therefore, he cannot receive outpatient dialysis at present. He will need respiratory isolation. These lesions started on Monday of this past week. He was started on antiretroviral therapy. Since some of the lesions still appear to be active and progressive, we will continue the patient on valacyclovir. It is unclear as to what he took as an outpatient; however, we will start him on valacyclovir and the dosing is 500 mg daily. It appears that he was also on prednisone, however, steroids according to a recent meta-analysis did not appear to have significant benefit. To treat the neuralgia, we will provide the patient with Percocet for now. We could potentially consider Lyrica if neuropathic pain becomes worse. The patient will need to undergo dialysis as an inpatient until his herpes zoster lesions  have encrusted.  2.  End-stage renal disease on hemodialysis Tuesday, and Saturday. The patient missed dialysis yesterday. Therefore, we will need to dialyze the patient today. We will arrange for dialysis treatment in the room under respiratory isolation today.  3.  Seizure disorder. We will resume the patient on Keppra 500 mg p.o. b.i.d.  4.  We will continue the patient on Ventolin and Qvar.  5.  Anemia of chronic kidney disease. This to be  managed by Nephrology. He will likely require Epogen with dialysis.  6.  Secondary hyperparathyroidism: We will continue patient on Renvela 800 mg p.o. t.i.d. and PhosLo 3 tablets p.o. t.i.d. with meals.  7.  Hypertension. We will continue the patient on Felodipine, minoxidil, and Coreg at their current doses.  8.  Gout. The patient will be continued on allopurinol 100 mg p.o. b.i.d.  9.  Deep vein thrombosis prophylaxis. We will start the patient heparin 5000 units subcutaneous every 8 hours.  10.  CODE STATUS:  FULL CODE STATUS.   TIME SPENT:  One hour.  ____________________________ Tama High, MD mnl:jm D: 04/09/2013 14:28:46 ET T: 04/09/2013 14:59:37 ET JOB#: [id number]  cc: Tama High, MD, <Dictator> Mariah Milling Martavius Lusty MD ELECTRONICALLY SIGNED 05/04/2013 10:48

## 2015-03-09 NOTE — Discharge Summary (Signed)
PATIENT NAME:  Jeremy Rose, BILBO MR#:  [id number] DATE OF BIRTH:  Dec 18, 1955  DATE OF ADMISSION:  03/18/2013 DATE OF DISCHARGE:  03/20/2013  DISCHARGE DIAGNOSES: Malfunctioning hemodialysis access, end-stage renal disease on hemodialysis, pneumonia on treatment, hypertension, anemia of chronic disease, asthma exacerbation.   CONDITION ON DISCHARGE: Stable.   CODE STATUS: Full code.   MEDICATIONS ADVISED ON DISCHARGE: Keppra 500 mg oral tablet 2 times a day, felodipine 5 mg oral tablet extended-release once a day in a.m., minoxidil 2.5 mg oral tablet once a day, allopurinol 100 mg oral tablet 2 times a day, carvedilol 25 mg oral tablet 2 times a day, QVAR 80 mcg inhalation aerosol 1 puff 2 times a day, Renvela 800 mg oral tablet 3 times a day, aspirin 81 mg tablet once a day, folic acid 0.8 mg oral tablet once a day, furosemide 20 mg oral tablet 2 times a day, multivitamin 1 tablet once a day, omeprazole 20 mg oral delayed-release tablet once a day, PhosLo 3 capsules orally 3 times a day, Ventolin 2 puffs inhaled 4 times a day as needed for shortness of breath, tramadol 50 mg oral tablet every 6 hours as needed for pain, vitamin D3 with 1000 international unit oral tablet once a day, prednisone 60 mg starting and taper 10 mg daily until complete, levofloxacin 250 mg oral tablet every 48 hours for 3 days.   DIET ON DISCHARGE: Renal diet. Consistency regular.   ACTIVITY LIMITATION: As tolerated.   TIMEFRAME TO FOLLOWUP: Within 2 to 4 weeks. Advised to have routine followups with primary care physician, Dr. Freddy Finner.   HISTORY OF PRESENT ILLNESS AND HOSPITAL COURSE: A 59 year old male with past medical history of significant end-stage renal disease, hemodialysis since November 2013, hypertension, arthritis, gout and seizure disorder. Last dialysis was on April 26. He could not go to dialysis for the last 2 scheduled days because he was feeling sick. He went to his PMD and she suggested to start  Levaquin for possible bronchitis and pneumonia. After missing 2 hemodialysis sessions, he went to hemodialysis center and after 8 minutes of starting dialysis, they were not able to get any blood flow from the fistula. He had some pulmonary edema and difficulty breathing, so he was sent to hospital for urgent hemodialysis.   HOSPITAL COURSE AND STAY: As the patient had malfunctioning AV fistula, nephrology consult was called in and they were able to access his AV fistula. It started working and his hemodialysis was done successfully in the hospital. The patient felt better after that.   Other medical issues addressed during the hospital stay:  1.  Asthma exacerbation with bronchitis: He was started on IV Solu-Medrol with nebulizer, bronchodilator treatments. He felt significantly better with that. Levaquin was continued as he was taking by PCP for pneumonia and after 2 days of treatment, he was discharged with oral tapering steroids.  2.  Pneumonia: The patient was already taking Levaquin by PCP and started feeling a little better, so we continued him on Levaquin in the hospital.  3.  Hypertension: Continued minoxidil, Coreg, felodipine and Lasix as he was taking at home and remained stable.  4.  Seizure disorder: He was on Keppra. We continued that.  5.  Anemia of chronic disease: He was getting Procrit during the dialysis. We continued that.  IMPORTANT LABORATORY AND RADIOLOGICAL RESULTS IN THE HOSPITAL: On presentation, creatinine was 20.64. Potassium was 4.7. Remained stable in the hospital. Phosphorus level was 8.7, which came down to 5.8. Chest  x-ray portable was consistent with mild interstitial edema, likely cardiac cause.   TOTAL TIME SPENT ON THIS DISCHARGE: 45 minutes.   ____________________________ Ceasar Lund Anselm Jungling, MD vgv:jm D: 03/22/2013 16:48:46 ET T: 03/22/2013 20:40:07 ET JOB#: [id number]  cc: Ceasar Lund. Anselm Jungling, MD, <Dictator> Dr. Ezequiel Kayser  [facility] MD ELECTRONICALLY SIGNED 03/31/2013 6:14

## 2015-03-09 NOTE — Discharge Summary (Signed)
PATIENT NAME:  Jeremy Rose, Jeremy Rose MR#:  [id number] DATE OF BIRTH:  1956/05/04  DATE OF ADMISSION:  03/18/2013  DATE OF DISCHARGE:  03/20/2013  DISCHARGE DIAGNOSES: Malfunctioning hemodialysis access. ESRD on hemodialysis. Pneumonia on treatment. Hypertension. Anemia of chronic disease. Asthma exacerbation.   CONDITION ON DISCHARGE:  Stable.   CODE STATUS:  FULL CODE.   MEDICATIONS ADVISED ON DISCHARGE: Keppra 500 mg oral tablet 2 times a day, felodipine 5 mg oral tablet extended-release once a day in morning, minoxidil 2.5 mg oral tablet once a day, allopurinol 100 mg oral tablet 2 times a day, carvedilol 25 mg oral tablet 2 times a day, QVAR 80 mcg inhalation aerosol 1 puff 2 times a day, Renvela 800 mg oral tablet 3 times a day, aspirin 81 mg tablet once a day, folic acid 0.8 mg oral tablet once a day, furosemide 20 mg oral tablet 2 times a day, multivitamin 1 tablet once a day, omeprazole 20 mg oral delayed-release tablet once a day, PhosLo 3 capsules orally 3 times a day, Ventolin 2 puffs inhaled 4 times a day as needed for shortness of breath, tramadol 50 mg oral tablet every 6 hours as needed for pain, vitamin D3, 1000 international units oral tablet once a day, prednisone 60 mg starting and taper 10 mg daily until complete, levofloxacin 250 mg oral tablet every 48 hours for 3 days.   DIET ON DISCHARGE:  Renal diet, consistency regular.   ACTIVITY LIMITATION:  As tolerated.   TIME FRAME TO FOLLOW UP:  Within 2 to 4 weeks. Advised to have routine followups with primary care physician, Dr.  Freddy Finner.  HISTORY OF PRESENT ILLNESS: A 59 year old male with past medical history of significant end-stage renal disease, hemodialysis since November 2013, hypertension, arthritis, gout and seizure disorder. Last dialysis was on April 26. He could not go to dialysis for last 2 scheduled days because he was feeling sick. He went to his PMD and he suggested to start Levaquin for possible bronchitis and  pneumonia, so after missing 2 hemodialysis sessions, he went to hemodialysis center and after 8 minutes of starting dialysis, they were not able to get any blood flow from the fistula. He had some pulmonary edema and difficulty breathing, so he was sent to hospital for urgent hemodialysis.   HOSPITAL COURSE AND STAY:  As patient had malfunctioning AV fistula, Nephrology consult was called in and they were able to access his AV fistula. It started working, and his hemodialysis was done successfully in the hospital. The patient felt better after that. Other medical issues addressed in the hospital stay:   Asthma exacerbation with bronchitis. He was started on IV Solu-Medrol with nebulizer, bronchodilator treatment. He felt significantly better with that. Levaquin was continued as he was taking by PCP for pneumonia, and after 2 days of treatment he was discharged with oral tapering steroids.   Pneumonia. The patient was already taking Levaquin by PCP and started feeling a little better, so we continued him on Levaquin in the hospital.   Hypertension. Continue Minoxidil, Coreg, felodipine and Lasix, as he was taking at home, and remained stable.   Seizure disorder. He was on Keppra, we continued that.   Anemia of chronic disease. He was getting Procrit during dialysis. We continued that.   IMPORTANT LAB RESULTS IN THE HOSPITAL: On presentation, creatinine was 20.64, potassium was 4.7, remained stable in the hospital. Phosphorus level was 8.7, which came down to 5.8. Chest x-ray portable was consistent with mild interstitial  edema, likely cardiac cause.   Total time spent in this discharge:  45 minutes.    ____________________________ Ceasar Lund Anselm Jungling, MD vgv:mr D: 03/22/2013 16:48:00 ET T: 03/22/2013 19:52:10 ET JOB#: [id number]  cc: Ceasar Lund. Anselm Jungling, MD, <Dictator> Dr. Ezequiel Kayser [facility] MD ELECTRONICALLY SIGNED 03/31/2013 6:14

## 2015-03-09 NOTE — Discharge Summary (Signed)
PATIENT NAME:  Jeremy Rose, Jeremy Rose MR#:  [id number] DATE OF BIRTH:  [birth year]  DATE OF ADMISSION:  07/05/2013 DATE OF DISCHARGE:  07/06/2013  For a detailed note, please take a look at the history and physical done on admission by Dr. Laurin Coder.   DIAGNOSES AT DISCHARGE:  Transient ischemic attack/cerebrovascular accident, end-stage renal disease, on hemodialysis; history of seizure disorder, hypertension, gastroesophageal reflux disease, gout.    DIET: The patient was discharged on a low sodium, low fat, renal diet.   ACTIVITY: As tolerated.   FOLLOWUP: Freddy Finner in the next 1 to 2 weeks.   DISCHARGE MEDICATIONS:  Keppra 500 mg b.i.d. with 1 extra dose on the days of dialysis (Tuesday, Thursday, Saturday), felodipine 5 mg daily, Coreg 25 mg b.i.d., folic acid 1 mg daily, Lasix 20 mg b.i.d., vitamin D3 1000 International Units daily, Qvar 80 mcg 2 puffs b.i.d., allopurinol 100 mg 2 tabs daily, albuterol inhaler 2 puffs q.4 hours as needed, omeprazole 20 mg daily, Vimpat 150 mg b.i.d. and 1 extra dose on Tuesday, Thursday, Saturday, days of dialysis; aspirin 81 mg daily.   CONSULTANTS DURING THE HOSPITAL COURSE: Dr. Anthonette Legato from nephrology.   PERTINENT STUDIES DONE DURING HOSPITAL COURSE: CT scan of the head done without contrast on admission showing no acute intracranial abnormality. A chest x-ray showing no evidence of acute cardiopulmonary disease. An MRI of the brain done without contrast showing mild sinus disease, otherwise unremarkable exam. An ultrasound the carotids showing no evidence of any hemodynamically significant carotid artery stenosis.   BRIEF HOSPITAL COURSE: This is a 59 year old male with medical problems as mentioned above, presented to the hospital due to slurred speech, right-sided numbness and difficulty walking.  1.  Acute CVA/TIA. This was the likely diagnosis given the patient's acute neurologic symptoms of slurred speech, having difficulty ambulating and  right-sided numbness. The patient underwent extensive testing including a CT head, an MRI of the brain and a carotid duplex, all of which were negative. The patient was maintained on his aspirin and is discharged on that. There was some consideration of escalating his therapy from aspirin to Plavix but the patient is a high risk for bleeding and we cannot truly say that he has failed aspirin therapy at this point. Therefore, I will not escalate further treatment from aspirin to Plavix at this point. If the patient were to develop another recurrent episode like this, he likely would benefit from possibly being switched over to Plavix or even Aggrenox.  2.  End-stage renal disease, on hemodialysis. The patient did have 1 dose of dialysis while in the hospital. He will resume his dialysis on Tuesday, Thursday and Saturday.   3.  History of seizures. The patient had no acute seizure-type activity. He will continue his Keppra and Vimpat.   4.  COPD.  The patient had no evidence of acute COPD exacerbation. He will continue his p.r.n. DuoNebs.  5.  History of gout. The patient was maintained on his allopurinol. He will also resume that upon discharge.   CODE STATUS: The patient is a full code.   TIME SPENT: 40 minutes.    ____________________________ Belia Heman. Verdell Carmine, MD vjs:cs D: 07/06/2013 15:26:20 ET T: 07/06/2013 20:28:22 ET JOB#: [id number]  cc: Belia Heman. Verdell Carmine, MD, <Dictator> Freddy Finner, NP  Henreitta Leber MD ELECTRONICALLY SIGNED 07/11/2013 13:39

## 2015-03-09 NOTE — Op Note (Signed)
PATIENT NAME:  Jeremy Rose, Jeremy Rose MR#:  [id number] DATE OF BIRTH:  1956-01-12  DATE OF PROCEDURE:  01/24/2013  PREOPERATIVE DIAGNOSIS:   End stage renal disease with functional permanent dialysis access and no longer needing Perm-Cath.  POSTOPERATIVE DIAGNOSIS:   End stage renal disease with functional permanent dialysis access and no longer needing Perm-Cath.  PROCEDURE PERFORMED:  Removal of right jugular Perm-Cath.    SURGEON:  [name], MD and Melvyn Neth, PA-C  ANESTHESIA:  Local.  ESTIMATED BLOOD LOSS:  Minimal.  INDICATION FOR PROCEDURE:  The patient has end stage renal disease with functioning left radiocephalic arteriovenous fistula and no longer in need of Perm-Cath.  Therefore, this will be removed.    DESCRIPTION OF PROCEDURE:  The patient was brought to the vascular interventional radiology area and positioned supine.  The right neck, chest and existing catheter were sterilely prepped and draped and a sterile surgical field was created.  The area was locally anesthetized copiously with 1% Lidocaine.  Hemostats were used to help dissect out the cuff.  An 11 blade was used to transect the fibrous sheath connecting the cuff.  The catheter was then removed in its entirety without difficulty with gentle traction.  Gentle pressure was held at the base of the neck.  A sterile dressing was placed.  The patient tolerated the procedure well.  No complications.    ____________________________ Marin Shutter Huel Centola, PA-C cnh:ct D: 01/24/2013 16:55:55 ET T: 01/25/2013 09:07:43 ET JOB#: [id number]  cc: Marin Shutter. Tillman Kazmierski, PA-C, <Dictator> [place] PA ELECTRONICALLY SIGNED 03/10/2013 8:54

## 2015-03-09 NOTE — Discharge Summary (Signed)
PATIENT NAME:  Jeremy Rose, Jeremy Rose MR#:  [id number] DATE OF BIRTH:  05-08-1956  DATE OF ADMISSION:  04/09/2013 DATE OF DISCHARGE:  04/11/2013  REASON FOR ADMISSION: Active lesions of herpes zoster; unable to obtain hemodialysis outpatient due to this, and missing one or two days of hemodialysis.   DISCHARGE DIAGNOSES: 1.  Herpes zoster.  2.  End-stage renal disease.  3.  Seizure disorder.  4.  Hypertension. 5.  Asthma. 6.  Anemia of chronic disease.  7.  Secondary hyperparathyroidism.  8.  Gout.   HOSPITAL COURSE: Mr. Swagerty is a very nice 59 year old gentleman who has a history of end-stage renal disease. He has hemodialysis on Tuesday, Thursday, Saturday. He also has a history of hypertension and anemia of chronic disease, seizure disorder, GERD and gout.   The patient presented to the Emergency Department at [facility] due to a new rash developing on the right side of his body on the bottom of the chest/top of the abdomen.   The rash developed this past Monday with a small vesicles, for which he was on dialysis on Tuesday, however he was not able to get dialysis on Thursday as his symptoms were worse and he had more lesions.  At the dialysis center they were not able to dialyze him and he was advised to come to the hospital.   In the hospital the patient was started on Valtrex, and by today most of his lesions are crusted, for which he is able to be discharged. We checked with the hemodialysis center, and they are able to take him back.    At this moment all sore issues were stable. He is to continue treatments with hemodialysis on Tuesday, Thursday, and Saturday.   Continue treatment for his seizures: He is going to be taking Epogen for his anemia of chronic disease. He did have a little bit of cough during his hospitalization. Chest x-ray did not show any acute pneumonias. He just had a little bit of fluid overload, which was related to missing dialysis.   The patient is  discharged in good condition. I discussed the case with case Freight forwarder.   MEDICATIONS AT DISCHARGE:  1.  Keppra 500 mg twice daily.  2.  Famotidine 5 mg once a day.  3.  Minoxidil 2.5 mg once a day.  4.  Allopurinol 100 mg twice daily.  5.  Coreg  25 mg twice daily.  6.  QVAR 80 mcg twice daily.  7.  Renvela 800 mg 3 times a day.  8.  Aspirin 81 mg once daily.  9. Folic acid 0.5 mg once a day.  10. Furosemide 20 mg twice daily.  11.  Multivitamins once a day.  12.  Omeprazole 20 mg once a day.  13.  PhosLo 3 tablets 3 times a day.  14.  Ventolin, as needed for shortness of breath.  15.  Rena-Vite 1 p.o. once a day.  16.  Complex B multivitamins.  17.  Tramadol 50 mg every 6 hours p.r.n. pain.  18.  Vitamin D3 1000 units once a day.  19.  Valtrex 500 mg once a day for the next 5 days. The patient already took 2 days of treatments for 7 days. His dose has been renally adjusted.  20.  Guaifenesin 600 mg every 12 hours for cough.   FOLLOWUP: With primary care physician in dialysis department.   I spent about 45 minutes with this discharge today.   ____________________________ Vail Sink, MD rsg:dm D:  04/11/2013 13:25:49 ET T: 04/11/2013 13:52:28 ET JOB#: [id number]  cc: [place] Sink, MD, <Dictator> Torri Michalski America Brown MD ELECTRONICALLY SIGNED 04/14/2013 21:31

## 2015-03-10 NOTE — Op Note (Signed)
PATIENT NAME:  Jeremy Rose, Jeremy Rose MR#:  [id number] DATE OF BIRTH:  1956/04/16  DATE OF PROCEDURE:  07/06/2014  PREOPERATIVE DIAGNOSES:  1. End-stage renal disease.  2. Markedly aneurysmal left radiocephalic arteriovenous fistula.  3. Hypertension.   POSTOPERATIVE DIAGNOSES: 1. Endstage renal disease.  2. Markedly aneurysmal left radiocephalic arteriovenous fistula. 3. Hypertension. 4.  PROCEDURES:  1. Ligation of aneurysmal left arm arteriovenous fistula.  2. Revision of arteriovenous fistula with jump graft revision using a 7 mm diameter artegraft.   SURGEON: Algernon Huxley, MD.  ANESTHESIA: General.   BLOOD LOSS: 50 mL.   INDICATION FOR PROCEDURE: A 59 year old gentleman with end-stage renal disease. His left radiocephalic AV fistula has become markedly aneurysmal and is now having issues with prolonged bleeding. This also is causing some compressive symptoms to help salvage a good access in his left arm. We plan to ligate aneurysmal portion of the fistula and perform a jump graft around this. The risks and benefits were discussed. Informed consent was obtained.   DESCRIPTION OF PROCEDURE: The patient is brought to the operative suite. After an adequate level of general tracheal anesthesia was obtained, the left upper extremity was sterilely prepped and draped, and a sterile surgical field was created. I started by creating cutdown at just above the area of the fistula anastomosis to the radial artery and dissected out the fistula so that it could be controlled. I then picked out a spot a few centimeters below the antecubital fossa beyond the marked aneurysmal portion of the fistula to a mature and good target for the distal portion of the bypass jump graft. I tunneled from one incision to the other with the curved tunneler and selected a 7 mm diameter Artegraft. This was marked for orientation. I then heparinized the patient with 3000 units of intravenous heparin. I clamped the fistula just  beyond the anastomosis and ligated the inflow to the aneurysmal portion of the fistula with 0 silks. The proximal anastomosis was then created in an end-to-end fashion using a parachute technique with two 6-0 Prolene sutures. The graft was then flushed with excellent pulsatile flow and a bulldog was used to control the graft. I then clamped the fistula distally to just the distal portion of the incision just below the antecubital fossa. I doubly ligated the aneurysmal portion of the fistula with 0 silks and prepared the vein for an anastomosis to the distal portion of the artegraft. Again, the anastomosis was created in an end-to-end fashion with two 6-0 Prolene in a parachuting fashion. The vessel was flushed and de-aired prior to releasing control and on release, there is excellent pulsatile flow through the graft. The wounds were irrigated. Surgicel and Evicel topical hemostatic agents were placed. Hemostasis was complete. The wound was then closed with 3-0 Vicryl and 4-0 Monocryl in  each incision and Dermabond was placed as a dressing. The patient tolerated the procedure well and was taken to the recovery room in stable condition.    ____________________________ Algernon Huxley, MD jsd:ls D: 07/06/2014 15:35:58 ET T: 07/06/2014 18:29:44 ET JOB#: [id number]  cc: Algernon Huxley, MD, <Dictator> Algernon Huxley MD ELECTRONICALLY SIGNED 07/12/2014 10:32

## 2015-03-10 NOTE — Op Note (Signed)
PATIENT NAME:  Jeremy Rose, Jeremy Rose MR#:  [id number] DATE OF BIRTH:  02-03-1956  DATE OF PROCEDURE:  06/26/2014  PREOPERATIVE DIAGNOSES: 1. End-stage renal disease.  2. Bleeding aneurysmal left arm arteriovenous fistula.   POSTOPERATIVE DIAGNOSES: 1. End-stage renal disease.  2. Bleeding aneurysmal left arm arteriovenous fistula.   PROCEDURES: 1.  Ultrasound guidance for vascular access to left internal jugular vein.  2.  Fluoroscopic guidance for placement of catheter.  3. Placement of a 23 cm tip-to-cuff tunneled hemodialysis catheter via the left internal jugular vein.   SURGEON:  [name], MD.   ANESTHESIA: Local with sedation.   BLOOD LOSS: 25 mL.   INDICATION FOR PROCEDURE: A 59 year old gentleman with long-standing end-stage renal disease. He has an aneurysmal left arm AV fistula with recent severe bleeding. The fistula does not have a clear area of stenosis or anything that can be treated endovascularly and it will require a surgical revision. As well, this fistula should not be used at this time due to the bleeding and the risk for continued life-threatening bleeding following the treatment.   DESCRIPTION OF THE PROCEDURE: The patient was brought to the vascular and interventional radiology suite. The patient's left neck and chest were sterilely prepped and draped and a sterile surgical field was created. The left internal jugular vein was visualized with ultrasound and found to be patent. It was then accessed under direct ultrasound guidance and a permanent image was recorded. A wire was placed. After a skin nick and dilatation, the peel-away sheath was placed over the wire.   I then turned my attention to an area under the clavicle. Approximately 2 fingerbreadths below the clavicle a small counter incision was created and we tunneled from the subclavicular incision to the access site. Using fluoroscopic guidance, a 23 cm tip-to-cuff tunneled hemodialysis catheter was selected,  tunneled from the subclavicular incision to the access site. It was then placed through the peel-away sheath and the peel-away sheath was removed. The catheter tips were parked in the right atrium. The appropriate distal connectors were placed. It withdrew blood well and flushed easily with heparinized saline and a concentrated heparin solution was then placed. It was secured to the chest wall with 2 Prolene sutures. The access incision was closed with a single 4-0 Monocryl. A 4-0 Monocryl pursestring suture was placed around the exit site. Sterile dressings were placed.   The patient tolerated the procedure well and was taken to the recovery room in stable condition.    ____________________________ Jeremy Huxley, MD jsd:nb D: 06/26/2014 08:50:03 ET T: 06/26/2014 09:59:58 ET JOB#: [id number]  cc: Jeremy Huxley, MD, <Dictator> Jeremy Huxley MD ELECTRONICALLY SIGNED 06/28/2014 14:27

## 2015-03-10 NOTE — Op Note (Signed)
PATIENT NAME:  Jeremy Rose, Jeremy Rose MR#:  [id number] DATE OF BIRTH:  [birth year]  DATE OF PROCEDURE:  10/23/2014  PREOPERATIVE DIAGNOSIS: End-stage renal disease with functional permanent dialysis access.   POSTOPERATIVE DIAGNOSIS: End-stage renal disease with functional permanent dialysis access.   PROCEDURE: Removal of left jugular PermCath.  SURGEON: Algernon Huxley, MD  ANESTHESIA:  Local.  ESTIMATED BLOOD LOSS:  Minimal.  INDICATION FOR PROCEDURE: This is a 59 year old gentleman with end-stage renal disease and a functional permanent dialysis access. He no longer needs this catheter, and this can be removed.   Risks and benefits were discussed and informed consent was obtained.   DESCRIPTION OF PROCEDURE:  The patient's left neck, chest and existing catheter were sterilely prepped and draped.  The area around the catheter was anesthetized copiously with 1% Lidocaine. The catheter was dissected out with curved hemostats until the cuff was freed from the surrounding fibrous sheath.  The fibrous sheath was transected and the catheter was then removed in its entirety using gentle traction.  Pressure was held and sterile dressings placed.    The patient tolerated the procedure well and was taken to the recovery room in stable condition.   ____________________________ Algernon Huxley, MD jsd:MT D: 10/23/2014 10:24:23 ET T: 10/23/2014 11:31:23 ET JOB#: [id number]  cc: Algernon Huxley, MD, <Dictator> Algernon Huxley MD ELECTRONICALLY SIGNED 11/16/2014 14:14

## 2015-03-10 NOTE — Op Note (Signed)
PATIENT NAME:  Jeremy Rose, Jeremy Rose MR#:  [id number] DATE OF BIRTH:  [birth year]  DATE OF PROCEDURE:  05/11/2014  PREOPERATIVE DIAGNOSES:  1.  Nonfunctional left arm arteriovenous fistula.  2.  End-stage renal disease.  3.  Hypertension.  4.  Epilepsy.   POSTOPERATIVE DIAGNOSES: 1.  Nonfunctional left arm arteriovenous fistula.  2.  End-stage renal disease.  3.  Hypertension.  4.  Epilepsy.   PROCEDURES: 1.  Ultrasound guidance for vascular access to left radiocephalic arteriovenous fistula.  2.  Left upper extremity fistulogram and central venogram.  3.  Percutaneous transluminal angioplasty of mid forearm cephalic vein with 10 mm diameter angioplasty balloon.   SURGEON: [name], M.D.   ANESTHESIA: Local with moderate conscious sedation.   ESTIMATED BLOOD LOSS: Minimal.   CONTRAST USED: 35 mL Visipaque and approximately 3 minutes of fluoroscopy time were used.   INDICATION FOR PROCEDURE: A 59 year old male with end-stage renal disease. He was sent from his dialysis center today as they reportedly were pulling clots out of his fistula and felt this was thrombosed. He was sent for attempt at salvage of the fistula. Risks and benefits were discussed. Informed consent was obtained.   DESCRIPTION OF PROCEDURE: The patient was brought to the vascular suite. The left upper extremity was sterilely prepped and draped and a sterile surgical field was created. The fistula had a nice thrill throughout its course, was accessed just beyond the anastomosis under ultrasound guidance with a micropuncture needle and a permanent image was recorded. A micropuncture wire and sheath were placed and we upsized to a 6 [place] sheath. Imaging demonstrated some narrowing between the aneurysmal segments in the mid forearm cephalic vein, which was still generous. There did appear to be a vein valve with some moderate stenosis in the 50%-60% range. The remainder of the fistula was widely patent. The outflow was dual  through the cephalic vein in the deep venous system and the central venous circulation was patent. The patient was given 3000 units of intravenous heparin. I crossed the lesion without difficulty with a Magic torque wire. A 10 mm diameter x 6 cm angioplasty balloon was inflated in this area in the mid forearm cephalic vein. There was not particularly tight waist seen, were narrowing with inflation, but it did go to profile and completion angiogram following this showed a patent fistula. The area of narrowing appeared to decrease slightly.  Mostly there was probably a component of tortuosity of the vein and the relative narrowing between aneurysmal segments. The sheath was removed, 4-0 Monocryl pursestring suture was placed. Pressure was held. Sterile dressing was placed. The patient tolerated the procedure well and was taken to the recovery room in stable condition.    ____________________________ Algernon Huxley, MD jsd:ts D: 05/11/2014 15:25:25 ET T: 05/11/2014 19:30:46 ET JOB#: [id number]  cc: Algernon Huxley, MD, <Dictator> Algernon Huxley MD ELECTRONICALLY SIGNED 05/22/2014 15:08

## 2015-10-08 ENCOUNTER — Other Ambulatory Visit: Payer: Self-pay | Admitting: Primary Care

## 2015-10-08 DIAGNOSIS — M79604 Pain in right leg: Secondary | ICD-10-CM

## 2015-10-12 ENCOUNTER — Ambulatory Visit: Admission: RE | Admit: 2015-10-12 | Payer: Medicare PPO | Source: Ambulatory Visit

## 2016-02-05 DIAGNOSIS — E875 Hyperkalemia: Secondary | ICD-10-CM | POA: Insufficient documentation

## 2016-04-24 ENCOUNTER — Encounter: Payer: Self-pay | Admitting: Podiatry

## 2016-04-24 ENCOUNTER — Ambulatory Visit (INDEPENDENT_AMBULATORY_CARE_PROVIDER_SITE_OTHER): Payer: Medicare PPO | Admitting: Podiatry

## 2016-04-24 DIAGNOSIS — B351 Tinea unguium: Secondary | ICD-10-CM

## 2016-04-24 DIAGNOSIS — M79674 Pain in right toe(s): Secondary | ICD-10-CM | POA: Diagnosis not present

## 2016-04-24 DIAGNOSIS — M79675 Pain in left toe(s): Secondary | ICD-10-CM | POA: Diagnosis not present

## 2016-04-24 NOTE — Progress Notes (Signed)
   Subjective:    Patient ID: GEMINI BLANCHETT, male    DOB: 06/15/1956, 60 y.o.   MRN: [id number]  HPI  60 year old male presents the office today for concerns of thick, painful, elongated toenails that he cannot trim himself. Denies any surrounding redness or drainage. No other complaints at this time.  Review of Systems  All other systems reviewed and are negative.      Objective:   Physical Exam General: AAO x3, NAD  Dermatological: Nails are hypertrophic, dystrophic, brittle, discolored, elongated 10. No surrounding redness or drainage. Tenderness nails 1-5 bilaterally. No open lesions or pre-ulcerative lesions identified at this time.  Vascular: Dorsalis Pedis artery and Posterior Tibial artery pedal pulses are palpable. There is no pain with calf compression, swelling, warmth, erythema.   Neruologic: Grossly intact via light touch bilateral. Vibratory intact via tuning fork bilateral. Protective threshold with Semmes Wienstein monofilament intact to all pedal sites bilateral.   Musculoskeletal: No gross boney pedal deformities bilateral. No pain, crepitus, or limitation noted with foot and ankle range of motion bilateral. Muscular strength 5/5 in all groups tested bilateral.      Assessment & Plan:  Symptomatic onychomycosis -Treatment options discussed including all alternatives, risks, and complications -Etiology of symptoms were discussed -Nails debrided 10 without complications or bleeding. -Daily foot inspection -Follow-up in 3 months or sooner if any problems arise. In the meantime, encouraged to call the office with any questions, concerns, change in symptoms.   Celesta Gentile, DPM

## 2016-05-27 ENCOUNTER — Other Ambulatory Visit: Payer: Self-pay | Admitting: Vascular Surgery

## 2016-06-03 ENCOUNTER — Encounter
Admission: RE | Disposition: A | Discharge status: Home / Self Care | Payer: Self-pay | Source: Ambulatory Visit | Attending: Vascular Surgery

## 2016-06-03 ENCOUNTER — Ambulatory Visit
Admission: RE | Admit: 2016-06-03 | Discharge: 2016-06-03 | Disposition: A | Discharge status: Home / Self Care | Payer: Medicare PPO | Source: Ambulatory Visit | Attending: Vascular Surgery | Admitting: Vascular Surgery

## 2016-06-03 DIAGNOSIS — T82898A Other specified complication of vascular prosthetic devices, implants and grafts, initial encounter: Secondary | ICD-10-CM | POA: Diagnosis not present

## 2016-06-03 DIAGNOSIS — M109 Gout, unspecified: Secondary | ICD-10-CM | POA: Insufficient documentation

## 2016-06-03 DIAGNOSIS — M6281 Muscle weakness (generalized): Secondary | ICD-10-CM | POA: Insufficient documentation

## 2016-06-03 DIAGNOSIS — Z87891 Personal history of nicotine dependence: Secondary | ICD-10-CM | POA: Insufficient documentation

## 2016-06-03 DIAGNOSIS — I120 Hypertensive chronic kidney disease with stage 5 chronic kidney disease or end stage renal disease: Secondary | ICD-10-CM | POA: Insufficient documentation

## 2016-06-03 DIAGNOSIS — R569 Unspecified convulsions: Secondary | ICD-10-CM | POA: Insufficient documentation

## 2016-06-03 DIAGNOSIS — Z809 Family history of malignant neoplasm, unspecified: Secondary | ICD-10-CM | POA: Diagnosis not present

## 2016-06-03 DIAGNOSIS — Z8249 Family history of ischemic heart disease and other diseases of the circulatory system: Secondary | ICD-10-CM | POA: Diagnosis not present

## 2016-06-03 DIAGNOSIS — M7989 Other specified soft tissue disorders: Secondary | ICD-10-CM | POA: Insufficient documentation

## 2016-06-03 DIAGNOSIS — N186 End stage renal disease: Secondary | ICD-10-CM | POA: Diagnosis not present

## 2016-06-03 DIAGNOSIS — Z992 Dependence on renal dialysis: Secondary | ICD-10-CM | POA: Diagnosis not present

## 2016-06-03 DIAGNOSIS — Z7982 Long term (current) use of aspirin: Secondary | ICD-10-CM | POA: Insufficient documentation

## 2016-06-03 DIAGNOSIS — E669 Obesity, unspecified: Secondary | ICD-10-CM | POA: Diagnosis not present

## 2016-06-03 DIAGNOSIS — Y832 Surgical operation with anastomosis, bypass or graft as the cause of abnormal reaction of the patient, or of later complication, without mention of misadventure at the time of the procedure: Secondary | ICD-10-CM | POA: Diagnosis not present

## 2016-06-03 DIAGNOSIS — I872 Venous insufficiency (chronic) (peripheral): Secondary | ICD-10-CM | POA: Diagnosis not present

## 2016-06-03 DIAGNOSIS — M542 Cervicalgia: Secondary | ICD-10-CM | POA: Insufficient documentation

## 2016-06-03 DIAGNOSIS — Z79899 Other long term (current) drug therapy: Secondary | ICD-10-CM | POA: Insufficient documentation

## 2016-06-03 DIAGNOSIS — Z885 Allergy status to narcotic agent status: Secondary | ICD-10-CM | POA: Insufficient documentation

## 2016-06-03 DIAGNOSIS — M79609 Pain in unspecified limb: Secondary | ICD-10-CM | POA: Diagnosis not present

## 2016-06-03 DIAGNOSIS — Z6825 Body mass index (BMI) 25.0-25.9, adult: Secondary | ICD-10-CM | POA: Diagnosis not present

## 2016-06-03 DIAGNOSIS — M2560 Stiffness of unspecified joint, not elsewhere classified: Secondary | ICD-10-CM | POA: Insufficient documentation

## 2016-06-03 DIAGNOSIS — K219 Gastro-esophageal reflux disease without esophagitis: Secondary | ICD-10-CM | POA: Insufficient documentation

## 2016-06-03 HISTORY — PX: PERIPHERAL VASCULAR CATHETERIZATION: SHX172C

## 2016-06-03 LAB — POTASSIUM (ARMC VASCULAR LAB ONLY): Potassium (ARMC vascular lab): 4.1 (ref 3.5–5.1)

## 2016-06-03 SURGERY — A/V SHUNTOGRAM/FISTULAGRAM
Anesthesia: Moderate Sedation | Site: Arm Lower | Laterality: Left

## 2016-06-03 MED ORDER — LIDOCAINE HCL (PF) 1 % IJ SOLN
INTRAMUSCULAR | Status: AC
  Filled 2016-06-03: qty 30

## 2016-06-03 MED ORDER — DEXTROSE 5 % IV SOLN: 1.5000 g | INTRAVENOUS | Status: DC

## 2016-06-03 MED ORDER — FENTANYL CITRATE (PF) 100 MCG/2ML IJ SOLN
INTRAMUSCULAR | Status: AC
  Filled 2016-06-03: qty 2

## 2016-06-03 MED ORDER — FENTANYL CITRATE (PF) 100 MCG/2ML IJ SOLN
INTRAMUSCULAR | Status: DC | PRN
  Administered 2016-06-03 (×2): 50 ug via INTRAVENOUS

## 2016-06-03 MED ORDER — SODIUM CHLORIDE 0.9 % IV SOLN
INTRAVENOUS | Status: DC
  Administered 2016-06-03: 14:00:00 via INTRAVENOUS

## 2016-06-03 MED ORDER — ONDANSETRON HCL 4 MG/2ML IJ SOLN: 4.0000 mg | Freq: Four times a day (QID) | INTRAMUSCULAR | Status: DC | PRN

## 2016-06-03 MED ORDER — HEPARIN (PORCINE) IN NACL 2-0.9 UNIT/ML-% IJ SOLN
INTRAMUSCULAR | Status: AC
  Filled 2016-06-03: qty 1000

## 2016-06-03 MED ORDER — METHYLPREDNISOLONE SODIUM SUCC 125 MG IJ SOLR: 125.0000 mg | INTRAMUSCULAR | Status: DC | PRN

## 2016-06-03 MED ORDER — HYDROMORPHONE HCL 1 MG/ML IJ SOLN: 1.0000 mg | Freq: Once | INTRAMUSCULAR | Status: DC

## 2016-06-03 MED ORDER — HEPARIN SODIUM (PORCINE) 1000 UNIT/ML IJ SOLN
INTRAMUSCULAR | Status: DC | PRN
  Administered 2016-06-03: 3000 [IU] via INTRAVENOUS

## 2016-06-03 MED ORDER — IOPAMIDOL (ISOVUE-300) INJECTION 61%
INTRAVENOUS | Status: DC | PRN
  Administered 2016-06-03: 25 mL via INTRAVENOUS

## 2016-06-03 MED ORDER — LIDOCAINE HCL (PF) 1 % IJ SOLN
INTRAMUSCULAR | Status: DC | PRN
  Administered 2016-06-03: 10 mL via INTRADERMAL

## 2016-06-03 MED ORDER — FAMOTIDINE 20 MG PO TABS: 40.0000 mg | ORAL_TABLET | ORAL | Status: DC | PRN

## 2016-06-03 MED ORDER — MIDAZOLAM HCL 2 MG/2ML IJ SOLN
INTRAMUSCULAR | Status: DC | PRN
  Administered 2016-06-03: 1 mg via INTRAVENOUS
  Administered 2016-06-03: 2 mg via INTRAVENOUS

## 2016-06-03 MED ORDER — MIDAZOLAM HCL 5 MG/5ML IJ SOLN
INTRAMUSCULAR | Status: AC
  Filled 2016-06-03: qty 5

## 2016-06-03 SURGICAL SUPPLY — 9 items
BALLN DORADO 8X60X80 (BALLOONS) ×4
BALLOON DORADO 8X60X80 (BALLOONS) ×2 IMPLANT
DEVICE PRESTO INFLATION (MISCELLANEOUS) ×4 IMPLANT
DRAPE BRACHIAL (DRAPES) ×4 IMPLANT
PACK ANGIOGRAPHY (CUSTOM PROCEDURE TRAY) ×4 IMPLANT
SET INTRO CAPELLA COAXIAL (SET/KITS/TRAYS/PACK) ×4 IMPLANT
SHEATH BRITE TIP 6FRX5.5 (SHEATH) ×4 IMPLANT
TOWEL OR 17X26 4PK STRL BLUE (TOWEL DISPOSABLE) ×4 IMPLANT
WIRE MAGIC TORQUE 260C (WIRE) ×4 IMPLANT

## 2016-06-03 NOTE — Op Note (Signed)
OPERATIVE NOTE   PROCEDURE: 1. Contrast injection left forearm  AV access 2. Percutaneous transluminal angioplasty venous portion left forearm AV access  PRE-OPERATIVE DIAGNOSIS: Complication of dialysis access                                                       End Stage Renal Disease  POST-OPERATIVE DIAGNOSIS: same as above   SURGEON: Katha Cabal, M.D.  ANESTHESIA: Conscious sedation was administered under my direct supervision. IV Versed plus fentanyl were utilized. Continuous ECG, pulse oximetry and blood pressure was monitored throughout the entire procedure.  Conscious sedation was for a total of 35.  ESTIMATED BLOOD LOSS: minimal  FINDING(S): Stricture of the AV graft  SPECIMEN(S):  None  CONTRAST: 25 cc  FLUOROSCOPY TIME: 1.0 minutes  INDICATIONS: Jeremy Rose is a 60 y.o. male who  presents with malfunctioning left forearm AV access.  The patient is scheduled for angiography with possible intervention of the AV access.  The patient is aware the risks include but are not limited to: bleeding, infection, thrombosis of the cannulated access, and possible anaphylactic reaction to the contrast.  The patient acknowledges if the access can not be salvaged a tunneled catheter will be needed and will be placed during this procedure.  The patient is aware of the risks of the procedure and elects to proceed with the angiogram and intervention.  DESCRIPTION: After full informed written consent was obtained, the patient was brought back to the Special Procedure suite and placed supine position.  Appropriate cardiopulmonary monitors were placed.  The left arm was prepped and draped in the standard fashion.  Appropriate timeout is called. The left radial basilic AV graft  was cannulated with a micropuncture needle.  The microwire was advanced and the needle was exchanged for  a microsheath.  The J-wire was then advanced and a 6 Fr sheath inserted.  Hand injections were  completed to image the access from the arterial anastomosis through the entire access.  The central venous structures were also imaged by hand injections.  Based on the images,  3000 units of heparin was given and a wire was negotiated through the strictures within the venous portion of the graft.  An 8 x 6 [place] balloon was used.  Inflations were to 14 atm for 1 minute.  Follow-up imaging demonstrates complete resolution of the stricture with rapid flow of contrast through the graft, the central venous anatomy is preserved.  A 4-0 Monocryl purse-string suture was sewn around the sheath.  The sheath was removed and light pressure was applied.  A sterile bandage was applied to the puncture site.    COMPLICATIONS: None  CONDITION: [name], M.D [facility] Office: [phone]  06/03/2016 4:12 PM

## 2016-06-03 NOTE — Discharge Instructions (Signed)
Fistulogram, Care After °Refer to this sheet in the next few weeks. These instructions provide you with information on caring for yourself after your procedure. Your health care provider may also give you more specific instructions. Your treatment has been planned according to current medical practices, but problems sometimes occur. Call your health care provider if you have any problems or questions after your procedure. °WHAT TO EXPECT AFTER THE PROCEDURE °After your procedure, it is typical to have the following: °· A small amount of discomfort in the area where the catheters were placed. °· A small amount of bruising around the fistula. °· Sleepiness and fatigue. °HOME CARE INSTRUCTIONS °· Rest at home for the day following your procedure. °· Do not drive or operate heavy machinery while taking pain medicine. °· Take medicines only as directed by your health care provider. °· Do not take baths, swim, or use a hot tub until your health care provider approves. You may shower 24 hours after the procedure or as directed by your health care provider. °· There are many different ways to close and cover an incision, including stitches, skin glue, and adhesive strips. Follow your health care provider's instructions on: °¨ Incision care. °¨ Bandage (dressing) changes and removal. °¨ Incision closure removal. °· Monitor your dialysis fistula carefully. °SEEK MEDICAL CARE IF: °· You have drainage, redness, swelling, or pain at your catheter site. °· You have a fever. °· You have chills. °SEEK IMMEDIATE MEDICAL CARE IF: °· You feel weak. °· You have trouble balancing. °· You have trouble moving your arms or legs. °· You have problems with your speech or vision. °· You can no longer feel a vibration or buzz when you put your fingers over your dialysis fistula. °· The limb that was used for the procedure: °¨ Swells. °¨ Is painful. °¨ Is cold. °¨ Is discolored, such as blue or pale white. °  °This information is not intended  to replace advice given to you by your health care provider. Make sure you discuss any questions you have with your health care provider. °  °Document Released: 03/20/2014 Document Reviewed: 03/20/2014 °Elsevier Interactive Patient Education ©2016 [employer] ° °

## 2016-06-03 NOTE — H&P (Signed)
[facility] History & Physical Update  The patient was interviewed and re-examined.  The patient's previous History and Physical has been reviewed and is unchanged.  There is no change in the plan of care. We plan to proceed with the scheduled procedure.  Schnier, Dolores Lory, MD  06/03/2016, 3:06 PM

## 2016-06-04 ENCOUNTER — Encounter: Payer: Self-pay | Admitting: Vascular Surgery

## 2016-07-29 ENCOUNTER — Ambulatory Visit: Payer: Medicare PPO | Admitting: Podiatry

## 2016-11-25 ENCOUNTER — Encounter (INDEPENDENT_AMBULATORY_CARE_PROVIDER_SITE_OTHER): Payer: Medicare PPO

## 2016-12-04 ENCOUNTER — Encounter (INDEPENDENT_AMBULATORY_CARE_PROVIDER_SITE_OTHER): Payer: Self-pay

## 2016-12-04 ENCOUNTER — Encounter (INDEPENDENT_AMBULATORY_CARE_PROVIDER_SITE_OTHER): Payer: Medicare PPO

## 2016-12-04 ENCOUNTER — Ambulatory Visit (INDEPENDENT_AMBULATORY_CARE_PROVIDER_SITE_OTHER): Payer: Medicare PPO | Admitting: Vascular Surgery

## 2016-12-11 ENCOUNTER — Ambulatory Visit: Payer: Medicare PPO | Admitting: Podiatry

## 2016-12-19 ENCOUNTER — Ambulatory Visit: Payer: Medicare PPO | Admitting: Podiatry

## 2016-12-23 ENCOUNTER — Ambulatory Visit (INDEPENDENT_AMBULATORY_CARE_PROVIDER_SITE_OTHER): Payer: Medicare PPO | Admitting: Podiatry

## 2016-12-23 ENCOUNTER — Encounter: Payer: Self-pay | Admitting: Podiatry

## 2016-12-23 DIAGNOSIS — M79609 Pain in unspecified limb: Secondary | ICD-10-CM | POA: Diagnosis not present

## 2016-12-23 DIAGNOSIS — L603 Nail dystrophy: Secondary | ICD-10-CM | POA: Diagnosis not present

## 2016-12-23 DIAGNOSIS — B351 Tinea unguium: Secondary | ICD-10-CM

## 2016-12-23 DIAGNOSIS — L608 Other nail disorders: Secondary | ICD-10-CM | POA: Diagnosis not present

## 2016-12-30 NOTE — Progress Notes (Signed)
   SUBJECTIVE Patient  presents to office today complaining of elongated, thickened nails. Pain while ambulating in shoes. Patient is unable to trim their own nails.   OBJECTIVE General Patient is awake, alert, and oriented x 3 and in no acute distress. Derm Skin is dry and supple bilateral. Negative open lesions or macerations. Remaining integument unremarkable. Nails are tender, long, thickened and dystrophic with subungual debris, consistent with onychomycosis, 1-5 bilateral. No signs of infection noted. Vasc  DP and PT pedal pulses palpable bilaterally. Temperature gradient within normal limits.  Neuro Epicritic and protective threshold sensation diminished bilaterally.  Musculoskeletal Exam No symptomatic pedal deformities noted bilateral. Muscular strength within normal limits.  ASSESSMENT 1. Onychodystrophic nails 1-5 bilateral with hyperkeratosis of nails.  2. Onychomycosis of nail due to dermatophyte bilateral 3. Pain in foot bilateral  PLAN OF CARE 1. Patient evaluated today.  2. Instructed to maintain good pedal hygiene and foot care.  3. Mechanical debridement of nails 1-5 bilaterally performed using a nail nipper. Filed with dremel without incident.  4. Return to clinic in 3 mos.    Brent M. Evans, DPM [facility]  Dr. Brent M. Evans, DPM    [address]                                        [place], [place] [address]                Office [phone]  Fax [phone]      

## 2017-01-19 ENCOUNTER — Telehealth: Payer: Self-pay | Admitting: Podiatry

## 2017-02-20 ENCOUNTER — Other Ambulatory Visit (INDEPENDENT_AMBULATORY_CARE_PROVIDER_SITE_OTHER): Payer: Self-pay | Admitting: Vascular Surgery

## 2017-02-24 ENCOUNTER — Encounter
Admission: RE | Disposition: A | Discharge status: Home / Self Care | Payer: Self-pay | Source: Ambulatory Visit | Attending: Vascular Surgery

## 2017-02-24 ENCOUNTER — Encounter: Payer: Self-pay | Admitting: Vascular Surgery

## 2017-02-24 ENCOUNTER — Ambulatory Visit
Admission: RE | Admit: 2017-02-24 | Discharge: 2017-02-24 | Disposition: A | Discharge status: Home / Self Care | Payer: Medicare PPO | Source: Ambulatory Visit | Attending: Vascular Surgery | Admitting: Vascular Surgery

## 2017-02-24 DIAGNOSIS — Z87891 Personal history of nicotine dependence: Secondary | ICD-10-CM | POA: Diagnosis not present

## 2017-02-24 DIAGNOSIS — R569 Unspecified convulsions: Secondary | ICD-10-CM | POA: Insufficient documentation

## 2017-02-24 DIAGNOSIS — Z992 Dependence on renal dialysis: Secondary | ICD-10-CM | POA: Insufficient documentation

## 2017-02-24 DIAGNOSIS — T82858A Stenosis of vascular prosthetic devices, implants and grafts, initial encounter: Secondary | ICD-10-CM | POA: Insufficient documentation

## 2017-02-24 DIAGNOSIS — I10 Essential (primary) hypertension: Secondary | ICD-10-CM | POA: Diagnosis not present

## 2017-02-24 DIAGNOSIS — Z9889 Other specified postprocedural states: Secondary | ICD-10-CM | POA: Diagnosis not present

## 2017-02-24 DIAGNOSIS — Z881 Allergy status to other antibiotic agents status: Secondary | ICD-10-CM | POA: Insufficient documentation

## 2017-02-24 DIAGNOSIS — I2510 Atherosclerotic heart disease of native coronary artery without angina pectoris: Secondary | ICD-10-CM

## 2017-02-24 DIAGNOSIS — N186 End stage renal disease: Secondary | ICD-10-CM | POA: Insufficient documentation

## 2017-02-24 DIAGNOSIS — Z885 Allergy status to narcotic agent status: Secondary | ICD-10-CM | POA: Diagnosis not present

## 2017-02-24 DIAGNOSIS — Y832 Surgical operation with anastomosis, bypass or graft as the cause of abnormal reaction of the patient, or of later complication, without mention of misadventure at the time of the procedure: Secondary | ICD-10-CM | POA: Diagnosis not present

## 2017-02-24 DIAGNOSIS — I120 Hypertensive chronic kidney disease with stage 5 chronic kidney disease or end stage renal disease: Secondary | ICD-10-CM | POA: Diagnosis not present

## 2017-02-24 DIAGNOSIS — T82868A Thrombosis of vascular prosthetic devices, implants and grafts, initial encounter: Secondary | ICD-10-CM

## 2017-02-24 HISTORY — PX: A/V FISTULAGRAM: CATH118298

## 2017-02-24 HISTORY — PX: A/V SHUNT INTERVENTION: CATH118220

## 2017-02-24 LAB — POTASSIUM (ARMC VASCULAR LAB ONLY): Potassium (ARMC vascular lab): 4.5 (ref 3.5–5.1)

## 2017-02-24 LAB — GLUCOSE, CAPILLARY: Glucose-Capillary: 97 mg/dL (ref 65–99)

## 2017-02-24 SURGERY — A/V FISTULAGRAM
Anesthesia: Moderate Sedation

## 2017-02-24 MED ORDER — CEFAZOLIN SODIUM-DEXTROSE 2-4 GM/100ML-% IV SOLN
2.0000 g | Freq: Once | INTRAVENOUS | Status: DC
  Filled 2017-02-24: qty 100

## 2017-02-24 MED ORDER — FENTANYL CITRATE (PF) 100 MCG/2ML IJ SOLN
INTRAMUSCULAR | Status: AC
  Filled 2017-02-24: qty 2

## 2017-02-24 MED ORDER — CEFAZOLIN IN D5W 1 GM/50ML IV SOLN
1.0000 g | Freq: Once | INTRAVENOUS | Status: AC
  Administered 2017-02-24: 1 g via INTRAVENOUS

## 2017-02-24 MED ORDER — FENTANYL CITRATE (PF) 100 MCG/2ML IJ SOLN
INTRAMUSCULAR | Status: DC | PRN
  Administered 2017-02-24: 50 ug via INTRAVENOUS
  Administered 2017-02-24: 25 ug via INTRAVENOUS

## 2017-02-24 MED ORDER — HEPARIN (PORCINE) IN NACL 2-0.9 UNIT/ML-% IJ SOLN
INTRAMUSCULAR | Status: AC
  Filled 2017-02-24: qty 1000

## 2017-02-24 MED ORDER — METHYLPREDNISOLONE SODIUM SUCC 125 MG IJ SOLR: 125.0000 mg | INTRAMUSCULAR | Status: DC | PRN

## 2017-02-24 MED ORDER — SODIUM CHLORIDE 0.9 % IV SOLN
INTRAVENOUS | Status: DC
Start: 2017-02-24 — End: 2017-02-24
  Administered 2017-02-24: 11:00:00 via INTRAVENOUS

## 2017-02-24 MED ORDER — IOPAMIDOL (ISOVUE-300) INJECTION 61%
INTRAVENOUS | Status: DC | PRN
  Administered 2017-02-24: 30 mL via INTRAVENOUS

## 2017-02-24 MED ORDER — ONDANSETRON HCL 4 MG/2ML IJ SOLN: 4.0000 mg | Freq: Four times a day (QID) | INTRAMUSCULAR | Status: DC | PRN

## 2017-02-24 MED ORDER — MIDAZOLAM HCL 2 MG/2ML IJ SOLN
INTRAMUSCULAR | Status: DC | PRN
  Administered 2017-02-24: 2 mg via INTRAVENOUS
  Administered 2017-02-24: 1 mg via INTRAVENOUS

## 2017-02-24 MED ORDER — FAMOTIDINE 20 MG PO TABS: 40.0000 mg | ORAL_TABLET | ORAL | Status: DC | PRN

## 2017-02-24 MED ORDER — HEPARIN SODIUM (PORCINE) 1000 UNIT/ML IJ SOLN
INTRAMUSCULAR | Status: AC
  Filled 2017-02-24: qty 1

## 2017-02-24 MED ORDER — MIDAZOLAM HCL 5 MG/5ML IJ SOLN
INTRAMUSCULAR | Status: AC
  Filled 2017-02-24: qty 5

## 2017-02-24 MED ORDER — HEPARIN SODIUM (PORCINE) 1000 UNIT/ML IJ SOLN
INTRAMUSCULAR | Status: DC | PRN
  Administered 2017-02-24: 3000 [IU] via INTRAVENOUS

## 2017-02-24 MED ORDER — LIDOCAINE HCL (PF) 1 % IJ SOLN
INTRAMUSCULAR | Status: AC
  Filled 2017-02-24: qty 30

## 2017-02-24 MED ORDER — HYDROMORPHONE HCL 1 MG/ML IJ SOLN: 1.0000 mg | Freq: Once | INTRAMUSCULAR | Status: DC | PRN

## 2017-02-24 SURGICAL SUPPLY — 15 items
BALLN DORADO 9X80X80 (BALLOONS) ×4
BALLN DORADO7X100X80 (BALLOONS) ×4
BALLN LUTONIX AV 10X60X75 (BALLOONS) ×8
BALLOON DORADO 9X80X80 (BALLOONS) ×2 IMPLANT
BALLOON DORADO7X100X80 (BALLOONS) ×2 IMPLANT
BALLOON LUTONIX AV 10X60X75 (BALLOONS) ×4 IMPLANT
DEVICE PRESTO INFLATION (MISCELLANEOUS) ×4 IMPLANT
NEEDLE ENTRY 21GA 7CM ECHOTIP (NEEDLE) ×4 IMPLANT
PACK ANGIOGRAPHY (CUSTOM PROCEDURE TRAY) ×4 IMPLANT
SET INTRO CAPELLA COAXIAL (SET/KITS/TRAYS/PACK) ×4 IMPLANT
SHEATH BRITE TIP 6FRX5.5 (SHEATH) ×4 IMPLANT
SHEATH BRITE TIP 7FRX11 (SHEATH) ×4 IMPLANT
SHEATH BRITE TIP 7FRX5.5 (SHEATH) ×4 IMPLANT
SUT MNCRL AB 4-0 PS2 18 (SUTURE) ×4 IMPLANT
WIRE MAGIC TORQUE 260C (WIRE) ×4 IMPLANT

## 2017-02-24 NOTE — H&P (Addendum)
[facility] Admission History & Physical  MRN : [id number]  Jeremy Rose is a 61 y.o. (1955/11/20) male who presents with chief complaint of No chief complaint on file. Marland Kitchen  History of Present Illness: I am asked to evaluate the patient by the dialysis center. The patient was sent here because they were unable to achieve adequate dialysis this morning. Furthermore the Center states there is very poor thrill and bruit. The patient states there there have been increasing problems with the access, such as "pulling clots" during dialysis and prolonged bleeding after decannulation. The patient estimates these problems have been going on for several weeks. The patient is unaware of any other change.  Patient denies pain or tenderness overlying the access.  There is no pain with dialysis.  The patient denies hand pain or finger pain consistent with steal syndrome.   There have been past interventions but no declots of this access.  The patient is not chronically hypotensive on dialysis.  Current Facility-Administered Medications  Medication Dose Route Frequency Provider Last Rate Last Dose  . 0.9 %  sodium chloride infusion   Intravenous Continuous Kimberly A Stegmayer, PA-C      . ceFAZolin (ANCEF) IVPB 2g/100 mL premix  2 g Intravenous Once [employer], PA-C      . famotidine (PEPCID) tablet 40 mg  40 mg Oral PRN Janalyn Harder Stegmayer, PA-C      . HYDROmorphone (DILAUDID) injection 1 mg  1 mg Intravenous Once PRN Janalyn Harder Stegmayer, PA-C      . methylPREDNISolone sodium succinate (SOLU-MEDROL) 125 mg/2 mL injection 125 mg  125 mg Intravenous PRN Kimberly A Stegmayer, PA-C      . ondansetron (ZOFRAN) injection 4 mg  4 mg Intravenous Q6H PRN Sela Hua, PA-C        Past Medical History:  Diagnosis Date  . Coronary artery disease   . Dialysis patient ([place])    T, TH, S  . Hypertension   . Renal disorder   . Seizures ([place])     Past Surgical  History:  Procedure Laterality Date  . AV FISTULA PLACEMENT Left   . CARDIAC CATHETERIZATION    . PERIPHERAL VASCULAR CATHETERIZATION Left 06/03/2016   Procedure: A/V Shuntogram/Fistulagram;  Surgeon: Katha Cabal, MD;  Location: [facility];  Service: Cardiovascular;  Laterality: Left;  . PERIPHERAL VASCULAR CATHETERIZATION  06/03/2016   Procedure: Peripheral Vascular Balloon Angioplasty;  Surgeon: Katha Cabal, MD;  Location: [place];  Service: Cardiovascular;;    Social History Social History  Substance Use Topics  . Smoking status: Former Research scientist (life sciences)  . Smokeless tobacco: Former Systems developer  . Alcohol use No    Family History No family history on file.  No family history of bleeding or clotting disorders, autoimmune disease or porphyria  Allergies  Allergen Reactions  . Codeine Nausea And Vomiting  . Doxycycline Nausea And Vomiting     REVIEW OF SYSTEMS (Negative unless checked)  Constitutional: [] Weight loss  [] Fever  [] Chills Cardiac: [] Chest pain   [] Chest pressure   [] Palpitations   [] Shortness of breath when laying flat   [] Shortness of breath at rest   [x] Shortness of breath with exertion. Vascular:  [] Pain in legs with walking   [] Pain in legs at rest   [] Pain in legs when laying flat   [] Claudication   [] Pain in feet when walking  [] Pain in feet at rest  [] Pain in feet when laying flat   []   History of DVT   [] Phlebitis   [] Swelling in legs   [] Varicose veins   [] Non-healing ulcers Pulmonary:   [] Uses home oxygen   [] Productive cough   [] Hemoptysis   [] Wheeze  [] COPD   [] Asthma Neurologic:  [] Dizziness  [] Blackouts   [] Seizures   [] History of stroke   [] History of TIA  [] Aphasia   [] Temporary blindness   [] Dysphagia   [] Weakness or numbness in arms   [] Weakness or numbness in legs Musculoskeletal:  [] Arthritis   [] Joint swelling   [] Joint pain   [] Low back pain Hematologic:  [] Easy bruising  [] Easy bleeding   [] Hypercoagulable state   [] Anemic   [] Hepatitis Gastrointestinal:  [] Blood in stool   [] Vomiting blood  [] Gastroesophageal reflux/heartburn   [] Difficulty swallowing. Genitourinary:  [x] Chronic kidney disease   [] Difficult urination  [] Frequent urination  [] Burning with urination   [] Blood in urine Skin:  [] Rashes   [] Ulcers   [] Wounds Psychological:  [] History of anxiety   []  History of major depression.  Physical Examination  Vitals:   02/24/17 0951  BP: (!) 143/95  Pulse: 72  Temp: 97.9 F (36.6 C)  TempSrc: Oral  Weight: 77.1 kg (170 lb)  Height: 5\' 8"  (1.727 m)   Body mass index is 25.85 kg/m. Gen: WD/WN, NAD Head: [place]/AT, No temporalis wasting. Prominent temp pulse not noted. Ear/Nose/Throat: Hearing grossly intact, nares w/o erythema or drainage, oropharynx w/o Erythema/Exudate,  Eyes: Conjunctiva clear, sclera non-icteric Neck: Trachea midline.  No JVD.  Pulmonary:  Good air movement, respirations not labored, no use of accessory muscles.  Cardiac: RRR, normal S1, S2. Vascular:  Left forearm AV fistula is markedly pulsatile with a staccato thrill. Skin integrity is good Vessel Right Left  Radial Palpable Palpable  Ulnar Not Palpable Not Palpable  Brachial Palpable Palpable  Carotid Palpable, without bruit Palpable, without bruit  Gastrointestinal: soft, non-tender/non-distended. No guarding/reflex.  Musculoskeletal: M/S 5/5 throughout.  Extremities without ischemic changes.  No deformity or atrophy.  Neurologic: Sensation grossly intact in extremities.  Symmetrical.  Speech is fluent. Motor exam as listed above. Psychiatric: Judgment intact, Mood & affect appropriate for pt's clinical situation. Dermatologic: No rashes or ulcers noted.  No cellulitis or open wounds. Lymph : No Cervical, Axillary, or Inguinal lymphadenopathy.   CBC Lab Results  Component Value Date   WBC 5.1 10/26/2014   HGB 9.5 (L) 10/26/2014   HCT 29.4 (L) 10/26/2014   MCV 98.0 10/26/2014   PLT 176 10/26/2014    BMET     Component Value Date/Time   NA 142 10/26/2014 0600   NA 138 05/18/2014 2142   K 4.9 10/26/2014 0600   K 5.0 06/28/2014 1254   CL 96 10/26/2014 0600   CL 98 05/18/2014 2142   CO2 30 10/26/2014 0600   CO2 33 (H) 05/18/2014 2142   GLUCOSE 118 (H) 10/26/2014 0600   GLUCOSE 88 05/18/2014 2142   BUN 40 (H) 10/26/2014 0600   BUN 12 05/18/2014 2142   CREATININE 9.60 (H) 10/26/2014 0600   CREATININE 5.73 (H) 05/18/2014 2142   CALCIUM 9.6 10/26/2014 0600   CALCIUM 8.3 (L) 05/18/2014 2142   GFRNONAA 5 (L) 10/26/2014 0600   GFRNONAA 10 (L) 05/18/2014 2142   GFRAA 6 (L) 10/26/2014 0600   GFRAA 12 (L) 05/18/2014 2142   CrCl cannot be calculated (Patient's most recent lab result is older than the maximum 21 days allowed.).  COAG No results found for: INR, PROTIME  Radiology No results found.  Assessment/Plan 1.  Complication  dialysis device with thrombosis AV access:  Patient's Left forearm AV dialysis access is malfunctioning. The patient will undergo angiography and correction of any problems using interventional techniques with the hope of restoring function to the access.  The risks and benefits were described to the patient.  All questions were answered.  The patient agrees to proceed with angiography and intervention. Potassium will be drawn to ensure that it is an appropriate level prior to performing intervention. 2.  End-stage renal disease requiring hemodialysis:  Patient will continue dialysis therapy without further interruption if a successful intervention is not achieved then a tunneled catheter will be placed. Dialysis has already been arranged. 3.  Hypertension:  Patient will continue medical management; nephrology is following no changes in oral medications. 4.  Coronary artery disease:  EKG will be monitored. Nitrates will be used if needed. The patient's oral cardiac medications will be continued.    Hortencia Pilar, MD  02/24/2017 10:00 AM

## 2017-02-24 NOTE — Op Note (Signed)
OPERATIVE NOTE   PROCEDURE: 1. Contrast injection left wrist  AV access 2. Percutaneous transluminal angioplasty to 10 mm with a Lutonix drug-eluting balloon left wrist AV access  PRE-OPERATIVE DIAGNOSIS: Complication of dialysis access                                                       End Stage Renal Disease  POST-OPERATIVE DIAGNOSIS: same as above   SURGEON: Katha Cabal, M.D.  ANESTHESIA: Conscious sedation was administered under my direct supervision by the interventional radiology RN. IV Versed plus fentanyl were utilized. Continuous ECG, pulse oximetry and blood pressure was monitored throughout the entire procedure.  Conscious sedation was for a total of 35.  ESTIMATED BLOOD LOSS: minimal  FINDING(S): Stricture of the AV graft  SPECIMEN(S):  None  CONTRAST: 30 cc  FLUOROSCOPY TIME: 2.8 minutes  INDICATIONS: Jeremy Rose is a 61 y.o. male who  presents with malfunctioning left wrist AV access.  The patient is scheduled for angiography with possible intervention of the AV access.  The patient is aware the risks include but are not limited to: bleeding, infection, thrombosis of the cannulated access, and possible anaphylactic reaction to the contrast.  The patient acknowledges if the access can not be salvaged a tunneled catheter will be needed and will be placed during this procedure.  The patient is aware of the risks of the procedure and elects to proceed with the angiogram and intervention.  DESCRIPTION: After full informed written consent was obtained, the patient was brought back to the Special Procedure suite and placed supine position.  Appropriate cardiopulmonary monitors were placed.  The left arm was prepped and draped in the standard fashion.  Appropriate timeout is called. The left wrist fistula  was cannulated with a micropuncture needle.  Cannulation was performed with ultrasound guidance. Ultrasound was placed in a sterile sleeve, the AV access was  interrogated and noted to be echolucent and compressible indicating patency. Image was recorded for the permanent record. The puncture is performed under continuous ultrasound visualization.   The microwire was advanced and the needle was exchanged for  a microsheath.  The J-wire was then advanced and a 6 Fr sheath inserted.  Hand injections were completed to image the access from the arterial anastomosis through the entire access.  The central venous structures were also imaged by hand injections.  Based on the images,  3000 units of heparin was given and a wire was negotiated through the strictures within the venous portion of the graft.  Initially, a 7 x 8 Dorado balloon was used.  Inflation was to 16 atm for 1 minute. Next, a 9 x 6 Dorado balloon was used 2 separate inflations were required in both inflations were to 16 atm for 1 minute. Lastly, a 10 x 6 Lutonix drug-eluting balloon was utilized to separate balloon inflations were required using 2 balloons each was 210 atm for 1 minute.  Follow-up imaging demonstrates complete resolution of the stricture with rapid flow of contrast through the graft, the central venous anatomy is preserved.  A 4-0 Monocryl purse-string suture was sewn around the sheath.  The sheath was removed and light pressure was applied.  A sterile bandage was applied to the puncture site.    COMPLICATIONS: None  CONDITION: Jeremy Rose, M.D [facility] Office: [phone]  02/24/2017 11:44 AM

## 2017-03-26 ENCOUNTER — Ambulatory Visit: Payer: Medicare PPO | Admitting: Podiatry

## 2017-04-08 ENCOUNTER — Other Ambulatory Visit (INDEPENDENT_AMBULATORY_CARE_PROVIDER_SITE_OTHER): Payer: Self-pay | Admitting: Vascular Surgery

## 2017-04-08 DIAGNOSIS — T829XXD Unspecified complication of cardiac and vascular prosthetic device, implant and graft, subsequent encounter: Secondary | ICD-10-CM

## 2017-04-08 DIAGNOSIS — N185 Chronic kidney disease, stage 5: Secondary | ICD-10-CM

## 2017-04-09 ENCOUNTER — Encounter (INDEPENDENT_AMBULATORY_CARE_PROVIDER_SITE_OTHER): Payer: Self-pay | Admitting: Vascular Surgery

## 2017-04-09 ENCOUNTER — Ambulatory Visit (INDEPENDENT_AMBULATORY_CARE_PROVIDER_SITE_OTHER): Payer: Medicare PPO

## 2017-04-09 ENCOUNTER — Ambulatory Visit (INDEPENDENT_AMBULATORY_CARE_PROVIDER_SITE_OTHER): Payer: Medicare PPO | Admitting: Vascular Surgery

## 2017-04-09 VITALS — BP 155/83 | HR 55 | Resp 16 | Wt 161.0 lb

## 2017-04-09 DIAGNOSIS — I10 Essential (primary) hypertension: Secondary | ICD-10-CM | POA: Diagnosis not present

## 2017-04-09 DIAGNOSIS — N186 End stage renal disease: Secondary | ICD-10-CM | POA: Diagnosis not present

## 2017-04-09 DIAGNOSIS — Z992 Dependence on renal dialysis: Secondary | ICD-10-CM | POA: Diagnosis not present

## 2017-04-09 DIAGNOSIS — N185 Chronic kidney disease, stage 5: Secondary | ICD-10-CM | POA: Diagnosis not present

## 2017-04-09 DIAGNOSIS — T829XXD Unspecified complication of cardiac and vascular prosthetic device, implant and graft, subsequent encounter: Secondary | ICD-10-CM | POA: Diagnosis not present

## 2017-04-09 NOTE — Progress Notes (Signed)
Subjective:    Patient ID: Jeremy Rose, male    DOB: Jan 16, 1956, 61 y.o.   MRN: [id number] Chief Complaint  Patient presents with  . Follow-up   Patient presents for his first post-procedure follow up on 02/24/17. Patient is without complaint. Post-procedure course is uneventful. Reports improvement in the function of his fistula. The patient underwent a duplex ultrasound of the AV access which was notable for a patent fistula without any significant hemodynamic stenosis. The patient denies any issues with hemodialysis such as cannulation problems, increased bleeding, decrease in doppler flow or recirculation. The patient also denies any fistula skin breakdown, pain, edema, pallor or ulceration of the arm / hand.    Review of Systems  Constitutional: Negative.   HENT: Negative.   Eyes: Negative.   Respiratory: Negative.   Cardiovascular: Negative.   Gastrointestinal: Negative.   Endocrine: Negative.   Genitourinary: Negative.   Musculoskeletal: Negative.   Skin: Negative.   Allergic/Immunologic: Negative.   Neurological: Negative.   Hematological: Negative.   Psychiatric/Behavioral: Negative.       Objective:   Physical Exam  Constitutional: He is oriented to person, place, and time. He appears well-developed and well-nourished. No distress.  HENT:  Head: Normocephalic and atraumatic.  Eyes: Conjunctivae are normal. Pupils are equal, round, and reactive to light.  Neck: Normal range of motion.  Cardiovascular: Normal rate, regular rhythm, normal heart sounds and intact distal pulses.   Pulses:      Radial pulses are 2+ on the right side, and 2+ on the left side.  Pulmonary/Chest: Effort normal and breath sounds normal.  Musculoskeletal: Normal range of motion. He exhibits no edema.  Neurological: He is alert and oriented to person, place, and time.  Skin: He is not diaphoretic.  Fistula site with aneurysmal sites - no skin threatening. Intact.   Psychiatric: He has  a normal mood and affect. His behavior is normal. Judgment and thought content normal.  Vitals reviewed.  BP (!) 155/83   Pulse (!) 55   Resp 16   Wt 161 lb (73 kg)   BMI 24.48 kg/m   Past Medical History:  Diagnosis Date  . Coronary artery disease   . Dialysis patient ([place])    T, TH, S  . Hypertension   . Renal disorder   . Seizures [facility])    Social History   Social History  . Marital status: Single    Spouse name: N/A  . Number of children: N/A  . Years of education: N/A   Occupational History  . Not on file.   Social History Main Topics  . Smoking status: Former Research scientist (life sciences)  . Smokeless tobacco: Former Systems developer  . Alcohol use No  . Drug use: No  . Sexual activity: Not on file   Other Topics Concern  . Not on file   Social History Narrative  . No narrative on file   Past Surgical History:  Procedure Laterality Date  . A/V SHUNT INTERVENTION N/A 02/24/2017   Procedure: A/V Shunt Intervention;  Surgeon: Katha Cabal, MD;  Location: [place] [facility];  Service: Cardiovascular;  Laterality: N/A;  . A/V SHUNTOGRAM Left 02/24/2017   Procedure: A/V Fistulagram;  Surgeon: Katha Cabal, MD;  Location: [place] [facility];  Service: Cardiovascular;  Laterality: Left;  . AV FISTULA PLACEMENT Left   . CARDIAC CATHETERIZATION    . PERIPHERAL VASCULAR CATHETERIZATION Left 06/03/2016   Procedure: A/V Shuntogram/Fistulagram;  Surgeon: Katha Cabal, MD;  Location:  [place] [facility];  Service: Cardiovascular;  Laterality: Left;  . PERIPHERAL VASCULAR CATHETERIZATION  06/03/2016   Procedure: Peripheral Vascular Balloon Angioplasty;  Surgeon: Katha Cabal, MD;  Location: [place] [facility];  Service: Cardiovascular;;   Family History  Problem Relation Age of Onset  . Heart disease Mother   . Kidney disease Paternal Uncle    Allergies  Allergen Reactions  . Codeine Nausea And Vomiting  . Doxycycline Nausea And Vomiting      Assessment & Plan:  Patient  presents for his first post-procedure follow up on 02/24/17. Patient is without complaint. Post-procedure course is uneventful. Reports improvement in the function of his fistula. The patient underwent a duplex ultrasound of the AV access which was notable for a patent fistula without any significant hemodynamic stenosis. The patient denies any issues with hemodialysis such as cannulation problems, increased bleeding, decrease in doppler flow or recirculation. The patient also denies any fistula skin breakdown, pain, edema, pallor or ulceration of the arm / hand.   1. ESRD (end stage renal disease) on dialysis ([place]) - Stable Will watch aneurysmal sites for growth Studies reviewed with patient. The patient is doing well and currently has adequate dialysis access. Duplex ultrasound of the AV access shows a patent access with no evidence of hemodynamically significant strictures or stenosis.  The patient should continue to have duplex ultrasounds of the dialysis access every six months. The patient was instructed to call the office in the interim if any issues with dialysis access / doppler flow, pain, edema, pallor, fistula skin breakdown or ulceration of the arm / hand occur. The patient expressed their understanding.  - VAS US DUPLEX DIALYSIS ACCESS (AVF,AVG); Future  2. Essential hypertension - Stable Encouraged good control as its slows the progression of atherosclerotic disease  Current Outpatient Prescriptions on File Prior to Visit  Medication Sig Dispense Refill  . albuterol (PROVENTIL HFA;VENTOLIN HFA) 108 (90 BASE) MCG/ACT inhaler Inhale into the lungs every 6 (six) hours as needed for wheezing or shortness of breath.    . allopurinol (ZYLOPRIM) 100 MG tablet Take 200 mg by mouth daily.    Marland Kitchen aspirin EC 81 MG tablet Take 81 mg by mouth daily.    . beclomethasone (QVAR) 80 MCG/ACT inhaler Inhale 1 puff into the lungs as needed (for shortness of breath).    . carvedilol (COREG) 25 MG tablet  Take 25 mg by mouth every evening.    . Cholecalciferol 1000 UNITS capsule Take 1,000 Units by mouth daily.    . cinacalcet (SENSIPAR) 60 MG tablet Take 120 mg by mouth daily.     . felodipine (PLENDIL) 2.5 MG 24 hr tablet Take 2.5 mg by mouth daily.     . folic acid (FOLVITE) 885 MCG tablet Take 400 mcg by mouth daily.    . Lacosamide (VIMPAT) 150 MG TABS Take 150 mg by mouth 2 (two) times daily. Takes extra tab on Tues, Thurs, and Sat    . omeprazole (PRILOSEC) 20 MG capsule Take 20 mg by mouth daily.    . sevelamer carbonate (RENVELA) 800 MG tablet Take 2,400 mg by mouth 3 (three) times daily with meals.     No current facility-administered medications on file prior to visit.     There are no Patient Instructions on file for this visit. No Follow-up on file.   Sruti Ayllon A Tarsha Blando, PA-C

## 2017-07-23 ENCOUNTER — Encounter (INDEPENDENT_AMBULATORY_CARE_PROVIDER_SITE_OTHER): Payer: Medicare PPO

## 2017-07-23 ENCOUNTER — Ambulatory Visit (INDEPENDENT_AMBULATORY_CARE_PROVIDER_SITE_OTHER): Payer: Medicare PPO | Admitting: Vascular Surgery

## 2017-08-27 ENCOUNTER — Encounter (INDEPENDENT_AMBULATORY_CARE_PROVIDER_SITE_OTHER): Payer: Medicare PPO

## 2017-08-27 ENCOUNTER — Ambulatory Visit (INDEPENDENT_AMBULATORY_CARE_PROVIDER_SITE_OTHER): Payer: Medicare PPO | Admitting: Vascular Surgery

## 2017-09-21 ENCOUNTER — Encounter (INDEPENDENT_AMBULATORY_CARE_PROVIDER_SITE_OTHER): Payer: Medicare PPO

## 2017-09-21 ENCOUNTER — Ambulatory Visit (INDEPENDENT_AMBULATORY_CARE_PROVIDER_SITE_OTHER): Payer: Medicare PPO | Admitting: Vascular Surgery

## 2017-10-01 ENCOUNTER — Ambulatory Visit (INDEPENDENT_AMBULATORY_CARE_PROVIDER_SITE_OTHER): Payer: Medicare PPO | Admitting: Vascular Surgery

## 2017-10-01 ENCOUNTER — Encounter (INDEPENDENT_AMBULATORY_CARE_PROVIDER_SITE_OTHER): Payer: Self-pay | Admitting: Vascular Surgery

## 2017-10-01 ENCOUNTER — Ambulatory Visit (INDEPENDENT_AMBULATORY_CARE_PROVIDER_SITE_OTHER): Payer: Medicare PPO

## 2017-10-01 VITALS — BP 110/68 | HR 55 | Resp 16 | Ht 68.0 in | Wt 162.0 lb

## 2017-10-01 DIAGNOSIS — Z992 Dependence on renal dialysis: Secondary | ICD-10-CM

## 2017-10-01 DIAGNOSIS — N186 End stage renal disease: Secondary | ICD-10-CM

## 2017-10-01 DIAGNOSIS — T829XXS Unspecified complication of cardiac and vascular prosthetic device, implant and graft, sequela: Secondary | ICD-10-CM | POA: Diagnosis not present

## 2017-10-01 DIAGNOSIS — I10 Essential (primary) hypertension: Secondary | ICD-10-CM

## 2017-10-01 DIAGNOSIS — I25119 Atherosclerotic heart disease of native coronary artery with unspecified angina pectoris: Secondary | ICD-10-CM

## 2017-10-02 ENCOUNTER — Encounter (INDEPENDENT_AMBULATORY_CARE_PROVIDER_SITE_OTHER): Payer: Self-pay | Admitting: Vascular Surgery

## 2017-10-02 DIAGNOSIS — T829XXA Unspecified complication of cardiac and vascular prosthetic device, implant and graft, initial encounter: Secondary | ICD-10-CM | POA: Insufficient documentation

## 2017-10-02 NOTE — Progress Notes (Signed)
MRN : [id number]  Jeremy Rose is a 61 y.o. (07/16/1956) male who presents with chief complaint of  Chief Complaint  Patient presents with  . Follow-up    14mos. HDA  .  History of Present Illness: The patient returns to the office for follow up regarding problem with the dialysis access. Currently the patient is maintained via a left arm wrist fistula.  Today he is complaining of increased pain while on dialysis at the forearm fistula site.  He is not been willing to let them stick the larger veins at the level of the antecubital fossa and above the arm.  These represent the cephalic and basilic veins.  The patient notes a significant increase in bleeding time after decannulation.  The patient has also been informed that there is increased recirculation.    No significant arm swelling.  The patient denies redness or swelling at the access site. The patient denies fever or chills at home or while on dialysis.  The patient denies amaurosis fugax or recent TIA symptoms. There are no recent neurological changes noted. The patient denies claudication symptoms or rest pain symptoms. The patient denies history of DVT, PE or superficial thrombophlebitis. The patient denies recent episodes of angina or shortness of breath.   Duplex ultrasound of the AV access shows a patent access.  The previously noted stenosis is progressed slightly compared to last study.     Current Meds  Medication Sig  . albuterol (PROVENTIL HFA;VENTOLIN HFA) 108 (90 BASE) MCG/ACT inhaler Inhale into the lungs every 6 (six) hours as needed for wheezing or shortness of breath.  . allopurinol (ZYLOPRIM) 100 MG tablet Take 200 mg by mouth daily.  Marland Kitchen aspirin EC 81 MG tablet Take 81 mg by mouth daily.  . beclomethasone (QVAR) 80 MCG/ACT inhaler Inhale 1 puff into the lungs as needed (for shortness of breath).  . carvedilol (COREG) 25 MG tablet Take 25 mg by mouth every evening.  . Cholecalciferol 1000 UNITS capsule  Take 1,000 Units by mouth daily.  . cinacalcet (SENSIPAR) 60 MG tablet Take 120 mg by mouth daily.   . cyanocobalamin 1000 MCG tablet Take 1,000 mcg daily by mouth.  . felodipine (PLENDIL) 2.5 MG 24 hr tablet Take 2.5 mg by mouth daily.   . Lacosamide (VIMPAT) 150 MG TABS Take 150 mg by mouth 2 (two) times daily. Takes extra tab on Tues, Thurs, and Sat  . levETIRAcetam (KEPPRA) 500 MG tablet Take 500 mg 2 (two) times daily by mouth.  Marland Kitchen omeprazole (PRILOSEC) 20 MG capsule Take 20 mg by mouth daily.  . sevelamer carbonate (RENVELA) 800 MG tablet Take 2,400 mg by mouth 3 (three) times daily with meals.    Past Medical History:  Diagnosis Date  . Coronary artery disease   . Dialysis patient ([place])    T, TH, S  . Hypertension   . Renal disorder   . Seizures ([place])     Past Surgical History:  Procedure Laterality Date  . A/V Fistulagram Left 02/24/2017   Performed by Katha Cabal, MD at [facility]  . A/V Shunt Intervention N/A 02/24/2017   Performed by Katha Cabal, MD at [facility]  . A/V Shuntogram/Fistulagram Left 06/03/2016   Performed by Katha Cabal, MD at [facility]  . AV FISTULA PLACEMENT Left   . CARDIAC CATHETERIZATION    . Peripheral Vascular Balloon Angioplasty  06/03/2016   Performed by Katha Cabal,  MD at [facility]    Social History Social History   Tobacco Use  . Smoking status: Former Research scientist (life sciences)  . Smokeless tobacco: Former Network engineer Use Topics  . Alcohol use: No  . Drug use: No    Family History Family History  Problem Relation Age of Onset  . Heart disease Mother   . Kidney disease Paternal Uncle     Allergies  Allergen Reactions  . Codeine Nausea And Vomiting  . Doxycycline Nausea And Vomiting     REVIEW OF SYSTEMS (Negative unless checked)  Constitutional: [] Weight loss  [] Fever  [] Chills Cardiac: [] Chest pain   [] Chest pressure   [] Palpitations   [] Shortness of breath when laying  flat   [] Shortness of breath with exertion. Vascular:  [] Pain in legs with walking   [] Pain in legs at rest  [] History of DVT   [] Phlebitis   [] Swelling in legs   [] Varicose veins   [] Non-healing ulcers Pulmonary:   [] Uses home oxygen   [] Productive cough   [] Hemoptysis   [] Wheeze  [] COPD   [] Asthma Neurologic:  [] Dizziness   [] Seizures   [] History of stroke   [] History of TIA  [] Aphasia   [] Vissual changes   [] Weakness or numbness in arm   [] Weakness or numbness in leg Musculoskeletal:   [] Joint swelling   [] Joint pain   [] Low back pain Hematologic:  [] Easy bruising  [] Easy bleeding   [] Hypercoagulable state   [] Anemic Gastrointestinal:  [] Diarrhea   [] Vomiting  [] Gastroesophageal reflux/heartburn   [] Difficulty swallowing. Genitourinary:  [x] Chronic kidney disease   [] Difficult urination  [] Frequent urination   [] Blood in urine Skin:  [] Rashes   [] Ulcers  Psychological:  [] History of anxiety   []  History of major depression.  Physical Examination  Vitals:   10/01/17 0847  BP: 110/68  Pulse: (!) 55  Resp: 16  Weight: 73.5 kg (162 lb)  Height: 5\' 8"  (1.727 m)   Body mass index is 24.63 kg/m. Gen: WD/WN, NAD Head: [place]/AT, No temporalis wasting.  Ear/Nose/Throat: Hearing grossly intact, nares w/o erythema or drainage Eyes: PER, EOMI, sclera nonicteric.  Neck: Supple, no large masses.   Pulmonary:  Good air movement, no audible wheezing bilaterally, no use of accessory muscles.  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  Skin appears to be deteriorated and thinned.  More proximal to the midportion of the fistula with a using he has large greater than 10 mm basilic and cephalic veins which are easily visible. Vessel Right Left  Radial Palpable Palpable  Ulnar Palpable Palpable  Brachial Palpable Palpable  Gastrointestinal: Non-distended. No guarding/no peritoneal signs.  Musculoskeletal:  M/S 5/5 throughout.  No deformity or atrophy.  Neurologic: CN 2-12 intact. Symmetrical.  Speech is fluent. Motor exam as listed above. Psychiatric: Judgment intact, Mood & affect appropriate for pt's clinical situation. Dermatologic: No rashes or ulcers noted.  No changes consistent with cellulitis. Lymph : No lichenification or skin changes of chronic lymphedema.  CBC Lab Results  Component Value Date   WBC 5.1 10/26/2014   HGB 9.5 (L) 10/26/2014   HCT 29.4 (L) 10/26/2014   MCV 98.0 10/26/2014   PLT 176 10/26/2014    BMET    Component Value Date/Time   NA 142 10/26/2014 0600   NA 138 05/18/2014 2142   K 4.9 10/26/2014 0600   K 5.0 06/28/2014 1254   CL 96 10/26/2014 0600  CL 98 05/18/2014 2142   CO2 30 10/26/2014 0600   CO2 33 (H) 05/18/2014 2142   GLUCOSE 118 (H) 10/26/2014 0600   GLUCOSE 88 05/18/2014 2142   BUN 40 (H) 10/26/2014 0600   BUN 12 05/18/2014 2142   CREATININE 9.60 (H) 10/26/2014 0600   CREATININE 5.73 (H) 05/18/2014 2142   CALCIUM 9.6 10/26/2014 0600   CALCIUM 8.3 (L) 05/18/2014 2142   GFRNONAA 5 (L) 10/26/2014 0600   GFRNONAA 10 (L) 05/18/2014 2142   GFRAA 6 (L) 10/26/2014 0600   GFRAA 12 (L) 05/18/2014 2142   CrCl cannot be calculated (Patient's most recent lab result is older than the maximum 21 days allowed.).  COAG No results found for: INR, PROTIME  Radiology No results found.   Assessment/Plan 1. Complication of arteriovenous dialysis fistula, sequela Recommend:  The patient is experiencing increasing problems with their dialysis access.  I have encouraged him to allow the center to cannulate his fistula above the elbow.  He is interested in revision.  I will plan to call the [facility] and see if they are having success with cannulation proximally if not he will require arm angiography and a fistulagram with the intention for preparing for surgery.  The intention is to restore appropriate flow and prevent thrombosis and  possible loss of the access.  As well as improve the quality of dialysis therapy.  The risks, benefits and alternative therapies were reviewed in detail with the patient.  All questions were answered.  The patient agrees to proceed with angio/intervention.      2. ESRD (end stage renal disease) on dialysis ([place]) Continue dialysis without interruption  3. Coronary artery disease involving native coronary artery of native heart with angina pectoris ([place]) Continue cardiac and antihypertensive medications as already ordered and reviewed, no changes at this time.  Continue statin as ordered and reviewed, no changes at this time  Nitrates PRN for chest pain   4. Essential hypertension Continue antihypertensive medications as already ordered, these medications have been reviewed and there are no changes at this time.     Hortencia Pilar, MD  10/02/2017 3:42 PM

## 2017-11-05 ENCOUNTER — Ambulatory Visit (INDEPENDENT_AMBULATORY_CARE_PROVIDER_SITE_OTHER): Payer: Medicare PPO | Admitting: Vascular Surgery

## 2017-11-17 DIAGNOSIS — R569 Unspecified convulsions: Secondary | ICD-10-CM

## 2017-11-17 HISTORY — DX: Unspecified convulsions: R56.9

## 2017-11-19 ENCOUNTER — Encounter (INDEPENDENT_AMBULATORY_CARE_PROVIDER_SITE_OTHER): Payer: Self-pay | Admitting: Vascular Surgery

## 2017-11-19 ENCOUNTER — Ambulatory Visit (INDEPENDENT_AMBULATORY_CARE_PROVIDER_SITE_OTHER): Payer: Medicare PPO | Admitting: Vascular Surgery

## 2017-11-19 ENCOUNTER — Encounter (INDEPENDENT_AMBULATORY_CARE_PROVIDER_SITE_OTHER): Payer: Self-pay

## 2017-11-19 VITALS — BP 117/68 | HR 63 | Resp 15 | Ht 68.0 in | Wt 167.0 lb

## 2017-11-19 DIAGNOSIS — Z992 Dependence on renal dialysis: Secondary | ICD-10-CM | POA: Diagnosis not present

## 2017-11-19 DIAGNOSIS — I10 Essential (primary) hypertension: Secondary | ICD-10-CM

## 2017-11-19 DIAGNOSIS — I25119 Atherosclerotic heart disease of native coronary artery with unspecified angina pectoris: Secondary | ICD-10-CM

## 2017-11-19 DIAGNOSIS — N186 End stage renal disease: Secondary | ICD-10-CM

## 2017-11-19 DIAGNOSIS — T829XXS Unspecified complication of cardiac and vascular prosthetic device, implant and graft, sequela: Secondary | ICD-10-CM | POA: Diagnosis not present

## 2017-11-19 NOTE — Progress Notes (Signed)
MRN : [id number]  Jeremy Rose is a 62 y.o. (June 19, 1956) male who presents with chief complaint of  Chief Complaint  Patient presents with  . Follow-up    1 month no studies  .  History of Present Illness: The patient returns to the office for follow up regarding problem with the dialysis access. Currently the patient is maintained via a left arm wrist fistula.  Today he is complaining of increased pain while on dialysis at the forearm fistula site.  He continues to not let them stick the larger veins at the level of the antecubital fossa and above the arm.  These represent the cephalic and basilic veins.  The patient notes a significant increase in bleeding time after decannulation.  The patient has also been informed that there is increased recirculation.    No significant arm swelling.  The patient denies redness or swelling at the access site. The patient denies fever or chills at home or while on dialysis.  The patient denies amaurosis fugax or recent TIA symptoms. There are no recent neurological changes noted. The patient denies claudication symptoms or rest pain symptoms. The patient denies history of DVT, PE or superficial thrombophlebitis. The patient denies recent episodes of angina or shortness of breath.   Previous duplex ultrasound of the AV access shows a patent access.  The previously noted stenosis is progressed slightly compared to last study.     Current Meds  Medication Sig  . albuterol (PROVENTIL HFA;VENTOLIN HFA) 108 (90 BASE) MCG/ACT inhaler Inhale into the lungs every 6 (six) hours as needed for wheezing or shortness of breath.  . allopurinol (ZYLOPRIM) 100 MG tablet Take 200 mg by mouth daily.  Marland Kitchen aspirin EC 81 MG tablet Take 81 mg by mouth daily.  . beclomethasone (QVAR) 80 MCG/ACT inhaler Inhale 1 puff into the lungs as needed (for shortness of breath).  . carvedilol (COREG) 25 MG tablet Take 25 mg by mouth every evening.  . Cholecalciferol  1000 UNITS capsule Take 1,000 Units by mouth daily.  . cinacalcet (SENSIPAR) 60 MG tablet Take 120 mg by mouth daily.   . cyanocobalamin 1000 MCG tablet Take 1,000 mcg daily by mouth.  . felodipine (PLENDIL) 2.5 MG 24 hr tablet Take 2.5 mg by mouth daily.   . folic acid (FOLVITE) 643 MCG tablet Take 400 mcg by mouth daily.  . Lacosamide (VIMPAT) 150 MG TABS Take 150 mg by mouth 2 (two) times daily. Takes extra tab on Tues, Thurs, and Sat  . levETIRAcetam (KEPPRA) 500 MG tablet Take 500 mg 2 (two) times daily by mouth.  . lidocaine-prilocaine (EMLA) cream Apply topically.  Marland Kitchen omeprazole (PRILOSEC) 20 MG capsule Take 20 mg by mouth daily.  . sevelamer carbonate (RENVELA) 800 MG tablet Take 2,400 mg by mouth 3 (three) times daily with meals.    Past Medical History:  Diagnosis Date  . Coronary artery disease   . Dialysis patient ([place])    T, TH, S  . Hypertension   . Renal disorder   . Seizures ([place])     Past Surgical History:  Procedure Laterality Date  . A/V FISTULAGRAM Left 02/24/2017   Procedure: A/V Fistulagram;  Surgeon: Katha Cabal, MD;  Location: [facility];  Service: Cardiovascular;  Laterality: Left;  . A/V SHUNT INTERVENTION N/A 02/24/2017   Procedure: A/V Shunt Intervention;  Surgeon: Katha Cabal, MD;  Location: [facility];  Service: Cardiovascular;  Laterality: N/A;  . AV FISTULA  PLACEMENT Left   . CARDIAC CATHETERIZATION    . PERIPHERAL VASCULAR CATHETERIZATION Left 06/03/2016   Procedure: A/V Shuntogram/Fistulagram;  Surgeon: Katha Cabal, MD;  Location: [facility];  Service: Cardiovascular;  Laterality: Left;  . PERIPHERAL VASCULAR CATHETERIZATION  06/03/2016   Procedure: Peripheral Vascular Balloon Angioplasty;  Surgeon: Katha Cabal, MD;  Location: [facility];  Service: Cardiovascular;;    Social History Social History   Tobacco Use  . Smoking status: Former Research scientist (life sciences)  . Smokeless tobacco: Former Dance movement psychotherapist Use Topics  . Alcohol use: No  . Drug use: No    Family History Family History  Problem Relation Age of Onset  . Heart disease Mother   . Kidney disease Paternal Uncle     Allergies  Allergen Reactions  . Codeine Nausea And Vomiting  . Doxycycline Nausea And Vomiting     REVIEW OF SYSTEMS (Negative unless checked)  Constitutional: [] Weight loss  [] Fever  [] Chills Cardiac: [] Chest pain   [] Chest pressure   [] Palpitations   [] Shortness of breath when laying flat   [] Shortness of breath with exertion. Vascular:  [] Pain in legs with walking   [] Pain in legs at rest  [] History of DVT   [] Phlebitis   [] Swelling in legs   [] Varicose veins   [] Non-healing ulcers Pulmonary:   [] Uses home oxygen   [] Productive cough   [] Hemoptysis   [] Wheeze  [] COPD   [] Asthma Neurologic:  [] Dizziness   [] Seizures   [] History of stroke   [] History of TIA  [] Aphasia   [] Vissual changes   [] Weakness or numbness in arm   [] Weakness or numbness in leg Musculoskeletal:   [] Joint swelling   [] Joint pain   [] Low back pain Hematologic:  [] Easy bruising  [] Easy bleeding   [] Hypercoagulable state   [] Anemic Gastrointestinal:  [] Diarrhea   [] Vomiting  [] Gastroesophageal reflux/heartburn   [] Difficulty swallowing. Genitourinary:  [x] Chronic kidney disease   [] Difficult urination  [] Frequent urination   [] Blood in urine Skin:  [] Rashes   [] Ulcers  Psychological:  [] History of anxiety   []  History of major depression.  Physical Examination  Vitals:   11/19/17 1332  BP: 117/68  Pulse: 63  Resp: 15  Weight: 167 lb (75.8 kg)  Height: 5\' 8"  (1.727 m)   Body mass index is 25.39 kg/m. Gen: WD/WN, NAD Head: [place]/AT, No temporalis wasting.  Ear/Nose/Throat: Hearing grossly intact, nares w/o erythema or drainage Eyes: PER, EOMI, sclera nonicteric.  Neck: Supple, no large masses.   Pulmonary:  Good air movement, no audible wheezing bilaterally, no use of accessory muscles.  Cardiac: RRR, no JVD Vascular:   aneurysmal fistula of the wrist mildly tender to palpation.  Large 8-10 mm upper arm basilic and cephalic veins Vessel Right Left  Radial Palpable Palpable  Ulnar Palpable Palpable  Brachial Palpable Palpable  Gastrointestinal: Non-distended. No guarding/no peritoneal signs.  Musculoskeletal: M/S 5/5 throughout.  No deformity or atrophy.  Neurologic: CN 2-12 intact. Symmetrical.  Speech is fluent. Motor exam as listed above. Psychiatric: Judgment intact, Mood & affect appropriate for pt's clinical situation. Dermatologic: No rashes or ulcers noted.  No changes consistent with cellulitis. Lymph : No lichenification or skin changes of chronic lymphedema.  CBC Lab Results  Component Value Date   WBC 5.1 10/26/2014   HGB 9.5 (L) 10/26/2014   HCT 29.4 (L) 10/26/2014   MCV 98.0 10/26/2014   PLT 176 10/26/2014    BMET    Component Value Date/Time  NA 142 10/26/2014 0600   NA 138 05/18/2014 2142   K 4.9 10/26/2014 0600   K 5.0 06/28/2014 1254   CL 96 10/26/2014 0600   CL 98 05/18/2014 2142   CO2 30 10/26/2014 0600   CO2 33 (H) 05/18/2014 2142   GLUCOSE 118 (H) 10/26/2014 0600   GLUCOSE 88 05/18/2014 2142   BUN 40 (H) 10/26/2014 0600   BUN 12 05/18/2014 2142   CREATININE 9.60 (H) 10/26/2014 0600   CREATININE 5.73 (H) 05/18/2014 2142   CALCIUM 9.6 10/26/2014 0600   CALCIUM 8.3 (L) 05/18/2014 2142   GFRNONAA 5 (L) 10/26/2014 0600   GFRNONAA 10 (L) 05/18/2014 2142   GFRAA 6 (L) 10/26/2014 0600   GFRAA 12 (L) 05/18/2014 2142   CrCl cannot be calculated (Patient's most recent lab result is older than the maximum 21 days allowed.).  COAG No results found for: INR, PROTIME  Radiology No results found.   Assessment/Plan 1. Complication of arteriovenous dialysis fistula, sequela Recommend:  At this time the patient does not have appropriate extremity access for dialysis  Patient should have a left brachial cephalic fistula created. At that time he should have the  aneurysmal fistula created.  The risks, benefits and alternative therapies were reviewed in detail with the patient.  All questions were answered.  The patient agrees to proceed with surgery.    2. ESRD (end stage renal disease) on dialysis [facility]) Continue dialysis without interruption.  His cephalic vein has already matured so the upper arm fistula will be usable the day after surgery  3. Coronary artery disease involving native coronary artery of native heart with angina pectoris ([place]) Continue cardiac and antihypertensive medications as already ordered and reviewed, no changes at this time.  Continue statin as ordered and reviewed, no changes at this time  Nitrates PRN for chest pain   4. Essential hypertension Continue antihypertensive medications as already ordered, these medications have been reviewed and there are no changes at this time.    Hortencia Pilar, MD  11/19/2017 1:50 PM

## 2017-11-23 ENCOUNTER — Other Ambulatory Visit (INDEPENDENT_AMBULATORY_CARE_PROVIDER_SITE_OTHER): Payer: Self-pay | Admitting: Vascular Surgery

## 2017-11-24 ENCOUNTER — Encounter
Admission: RE | Admit: 2017-11-24 | Discharge: 2017-11-24 | Disposition: A | Discharge status: Home / Self Care | Payer: Medicare PPO | Source: Ambulatory Visit | Attending: Vascular Surgery | Admitting: Vascular Surgery

## 2017-11-24 ENCOUNTER — Other Ambulatory Visit: Payer: Self-pay

## 2017-11-24 DIAGNOSIS — Z841 Family history of disorders of kidney and ureter: Secondary | ICD-10-CM | POA: Insufficient documentation

## 2017-11-24 DIAGNOSIS — Z01812 Encounter for preprocedural laboratory examination: Secondary | ICD-10-CM | POA: Insufficient documentation

## 2017-11-24 DIAGNOSIS — I10 Essential (primary) hypertension: Secondary | ICD-10-CM | POA: Insufficient documentation

## 2017-11-24 DIAGNOSIS — Z01810 Encounter for preprocedural cardiovascular examination: Secondary | ICD-10-CM | POA: Diagnosis present

## 2017-11-24 DIAGNOSIS — R569 Unspecified convulsions: Secondary | ICD-10-CM | POA: Diagnosis not present

## 2017-11-24 DIAGNOSIS — Z881 Allergy status to other antibiotic agents status: Secondary | ICD-10-CM | POA: Insufficient documentation

## 2017-11-24 DIAGNOSIS — I120 Hypertensive chronic kidney disease with stage 5 chronic kidney disease or end stage renal disease: Secondary | ICD-10-CM | POA: Insufficient documentation

## 2017-11-24 DIAGNOSIS — T82848S Pain from vascular prosthetic devices, implants and grafts, sequela: Secondary | ICD-10-CM | POA: Insufficient documentation

## 2017-11-24 DIAGNOSIS — I770 Arteriovenous fistula, acquired: Secondary | ICD-10-CM | POA: Insufficient documentation

## 2017-11-24 DIAGNOSIS — N186 End stage renal disease: Secondary | ICD-10-CM | POA: Diagnosis not present

## 2017-11-24 DIAGNOSIS — I25119 Atherosclerotic heart disease of native coronary artery with unspecified angina pectoris: Secondary | ICD-10-CM | POA: Insufficient documentation

## 2017-11-24 DIAGNOSIS — X58XXXS Exposure to other specified factors, sequela: Secondary | ICD-10-CM | POA: Diagnosis not present

## 2017-11-24 DIAGNOSIS — Z8249 Family history of ischemic heart disease and other diseases of the circulatory system: Secondary | ICD-10-CM | POA: Insufficient documentation

## 2017-11-24 DIAGNOSIS — E1122 Type 2 diabetes mellitus with diabetic chronic kidney disease: Secondary | ICD-10-CM | POA: Diagnosis not present

## 2017-11-24 DIAGNOSIS — Z9889 Other specified postprocedural states: Secondary | ICD-10-CM | POA: Insufficient documentation

## 2017-11-24 DIAGNOSIS — M25532 Pain in left wrist: Secondary | ICD-10-CM | POA: Insufficient documentation

## 2017-11-24 DIAGNOSIS — Z87891 Personal history of nicotine dependence: Secondary | ICD-10-CM | POA: Diagnosis not present

## 2017-11-24 DIAGNOSIS — Z885 Allergy status to narcotic agent status: Secondary | ICD-10-CM | POA: Diagnosis not present

## 2017-11-24 HISTORY — DX: Gastro-esophageal reflux disease without esophagitis: K21.9

## 2017-11-24 LAB — SURGICAL PCR SCREEN
MRSA, PCR: NEGATIVE
Staphylococcus aureus: NEGATIVE

## 2017-11-24 LAB — CBC WITH DIFFERENTIAL/PLATELET
BASOS ABS: 0.1 10*3/uL (ref 0–0.1)
BASOS PCT: 1 %
Eosinophils Absolute: 0.1 10*3/uL (ref 0–0.7)
Eosinophils Relative: 2 %
HEMATOCRIT: 35 % — AB (ref 40.0–52.0)
Hemoglobin: 11.6 g/dL — ABNORMAL LOW (ref 13.0–18.0)
LYMPHS PCT: 21 %
Lymphs Abs: 1.1 10*3/uL (ref 1.0–3.6)
MCH: 31.9 pg (ref 26.0–34.0)
MCHC: 33.2 g/dL (ref 32.0–36.0)
MCV: 96.1 fL (ref 80.0–100.0)
Monocytes Absolute: 0.6 10*3/uL (ref 0.2–1.0)
Monocytes Relative: 12 %
NEUTROS ABS: 3.3 10*3/uL (ref 1.4–6.5)
NEUTROS PCT: 64 %
Platelets: 194 10*3/uL (ref 150–440)
RBC: 3.64 MIL/uL — AB (ref 4.40–5.90)
RDW: 15 % — ABNORMAL HIGH (ref 11.5–14.5)
WBC: 5.1 10*3/uL (ref 3.8–10.6)

## 2017-11-24 LAB — BASIC METABOLIC PANEL
Anion gap: 13 (ref 5–15)
BUN: 28 mg/dL — ABNORMAL HIGH (ref 6–20)
CO2: 34 mmol/L — ABNORMAL HIGH (ref 22–32)
Calcium: 9.2 mg/dL (ref 8.9–10.3)
Chloride: 92 mmol/L — ABNORMAL LOW (ref 101–111)
Creatinine, Ser: 8.16 mg/dL — ABNORMAL HIGH (ref 0.61–1.24)
GFR, EST AFRICAN AMERICAN: 7 mL/min — AB (ref >60)
GFR, EST NON AFRICAN AMERICAN: 6 mL/min — AB (ref >60)
GLUCOSE: 98 mg/dL (ref 65–99)
POTASSIUM: 4.1 mmol/L (ref 3.5–5.1)
SODIUM: 139 mmol/L (ref 135–145)

## 2017-11-24 LAB — APTT: APTT: 33 s (ref 24–36)

## 2017-11-24 LAB — PROTIME-INR
INR: 0.94
Prothrombin Time: 12.5 seconds (ref 11.4–15.2)

## 2017-11-24 LAB — TYPE AND SCREEN
ABO/RH(D): O NEG
ANTIBODY SCREEN: NEGATIVE

## 2017-11-24 MED ORDER — CHLORHEXIDINE GLUCONATE CLOTH 2 % EX PADS
6.0000 | MEDICATED_PAD | Freq: Once | CUTANEOUS | Status: DC
  Filled 2017-11-24: qty 6

## 2017-11-24 NOTE — Pre-Procedure Instructions (Signed)
Patient has dialysis on M-W-F.  Dr Nino Parsley office notified that surgery is scheduled on patient's dialysis day.  "No problem his dialysis will be done either before or after surgery." per Dr Nino Parsley office.

## 2017-11-24 NOTE — Patient Instructions (Addendum)
Your procedure is scheduled on: 12/02/17 Wed Report to Same Day Surgery 2nd floor medical mall [facility] Entrance-take elevator on left to 2nd floor.  Check in with surgery information desk.) To find out your arrival time please call [phone] between 1PM - 3PM on 12/01/17 Tues  Remember: Instructions that are not followed completely may result in serious medical risk, up to and including death, or upon the discretion of your surgeon and anesthesiologist your surgery may need to be rescheduled.    _x___ 1. Do not eat food after midnight the night before your procedure. You may drink clear liquids up to 2 hours before you are scheduled to arrive at the hospital for your procedure.  Do not drink clear liquids within 2 hours of your scheduled arrival to the hospital.  Clear liquids include  --Water or Apple juice without pulp  --Clear carbohydrate beverage such as ClearFast or Gatorade  --Black Coffee or Clear Tea (No milk, no creamers, do not add anything to                  the coffee or Tea Type 1 and type 2 diabetics should only drink water.  No gum chewing or hard candies.     __x__ 2. No Alcohol for 24 hours before or after surgery.   __x__3. No Smoking for 24 prior to surgery.   ____  4. Bring all medications with you on the day of surgery if instructed.    __x__ 5. Notify your doctor if there is any change in your medical condition     (cold, fever, infections).     Do not wear jewelry, make-up, hairpins, clips or nail polish.  Do not wear lotions, powders, or perfumes. You may wear deodorant.  Do not shave 48 hours prior to surgery. Men may shave face and neck.  Do not bring valuables to the hospital.    [facility] is not responsible for any belongings or valuables.               Contacts, dentures or bridgework may not be worn into surgery.  Leave your suitcase in the car. After surgery it may be brought to your room.  For patients admitted to the hospital, discharge  time is determined by your                       treatment team.   Patients discharged the day of surgery will not be allowed to drive home.  You will need someone to drive you home and stay with you the night of your procedure.    Please read over the following fact sheets that you were given:   [facility] Preparing for Surgery and or MRSA Information   _x___ Take anti-hypertensive listed below, cardiac, seizure, asthma,     anti-reflux and psychiatric medicines. These include:  1. albuterol (PROVENTIL HFA;VENTOLIN HFA) 108 (90 BASE) MCG/ACT inhaler  2.beclomethasone (QVAR) 80 MCG/ACT inhaler  3.carvedilol (COREG) 25 MG tablet  4.felodipine (PLENDIL) 2.5 MG 24 hr tablet  5.levETIRAcetam (KEPPRA) 500 MG tablet  6.Lacosamide (VIMPAT) 150 MG TABS  ____Fleets enema or Magnesium Citrate as directed.   _x___ Use CHG Soap or sage wipes as directed on instruction sheet   ____ Use inhalers on the day of surgery and bring to hospital day of surgery  ____ Stop Metformin and Janumet 2 days prior to surgery.    ____ Take 1/2 of usual insulin dose the night before  surgery and none on the morning     surgery.   _x___ Follow recommendations from Cardiologist, Pulmonologist or PCP regarding          stopping Aspirin, Coumadin, Plavix ,Eliquis, Effient, or Pradaxa, and Pletal.  X____Stop Anti-inflammatories such as Advil, Aleve, Ibuprofen, Motrin, Naproxen, Naprosyn, Goodies powders or aspirin products. OK to take Tylenol and                          Celebrex.   _x___ Stop supplements until after surgery.  But may continue Vitamin D, Vitamin B,       and multivitamin.   ____ Bring C-Pap to the hospital.

## 2017-12-01 MED ORDER — CEFAZOLIN SODIUM-DEXTROSE 2-4 GM/100ML-% IV SOLN
2.0000 g | INTRAVENOUS | Status: AC
  Administered 2017-12-02: 2 g via INTRAVENOUS

## 2017-12-02 ENCOUNTER — Encounter
Admission: RE | Disposition: A | Discharge status: Home / Self Care | Payer: Self-pay | Source: Ambulatory Visit | Attending: Vascular Surgery

## 2017-12-02 ENCOUNTER — Ambulatory Visit: Payer: Medicare PPO | Admitting: Anesthesiology

## 2017-12-02 ENCOUNTER — Ambulatory Visit
Admission: RE | Admit: 2017-12-02 | Discharge: 2017-12-02 | Disposition: A | Discharge status: Home / Self Care | Payer: Medicare PPO | Source: Ambulatory Visit | Attending: Vascular Surgery | Admitting: Vascular Surgery

## 2017-12-02 DIAGNOSIS — Z992 Dependence on renal dialysis: Secondary | ICD-10-CM | POA: Diagnosis not present

## 2017-12-02 DIAGNOSIS — Z87891 Personal history of nicotine dependence: Secondary | ICD-10-CM | POA: Diagnosis not present

## 2017-12-02 DIAGNOSIS — I25119 Atherosclerotic heart disease of native coronary artery with unspecified angina pectoris: Secondary | ICD-10-CM | POA: Insufficient documentation

## 2017-12-02 DIAGNOSIS — I120 Hypertensive chronic kidney disease with stage 5 chronic kidney disease or end stage renal disease: Secondary | ICD-10-CM | POA: Diagnosis not present

## 2017-12-02 DIAGNOSIS — Y832 Surgical operation with anastomosis, bypass or graft as the cause of abnormal reaction of the patient, or of later complication, without mention of misadventure at the time of the procedure: Secondary | ICD-10-CM | POA: Diagnosis not present

## 2017-12-02 DIAGNOSIS — N186 End stage renal disease: Secondary | ICD-10-CM | POA: Insufficient documentation

## 2017-12-02 DIAGNOSIS — T82848A Pain from vascular prosthetic devices, implants and grafts, initial encounter: Secondary | ICD-10-CM | POA: Diagnosis present

## 2017-12-02 HISTORY — PX: LIGATION OF ARTERIOVENOUS  FISTULA: SHX5948

## 2017-12-02 HISTORY — PX: AV FISTULA PLACEMENT: SHX1204

## 2017-12-02 LAB — POCT I-STAT 4, (NA,K, GLUC, HGB,HCT)
GLUCOSE: 95 mg/dL (ref 65–99)
HEMATOCRIT: 36 % — AB (ref 39.0–52.0)
Hemoglobin: 12.2 g/dL — ABNORMAL LOW (ref 13.0–17.0)
Potassium: 4.7 mmol/L (ref 3.5–5.1)
Sodium: 140 mmol/L (ref 135–145)

## 2017-12-02 LAB — ABO/RH: ABO/RH(D): O NEG

## 2017-12-02 SURGERY — ARTERIOVENOUS (AV) FISTULA CREATION
Anesthesia: General | Laterality: Left | Wound class: Clean

## 2017-12-02 MED ORDER — VASOPRESSIN 20 UNIT/ML IV SOLN
INTRAVENOUS | Status: AC
  Filled 2017-12-02: qty 1

## 2017-12-02 MED ORDER — FENTANYL CITRATE (PF) 100 MCG/2ML IJ SOLN
25.0000 ug | INTRAMUSCULAR | Status: DC | PRN
  Administered 2017-12-02 (×4): 25 ug via INTRAVENOUS

## 2017-12-02 MED ORDER — ATROPINE SULFATE 0.4 MG/ML IJ SOLN
INTRAMUSCULAR | Status: AC
  Filled 2017-12-02: qty 1

## 2017-12-02 MED ORDER — ONDANSETRON HCL 4 MG/2ML IJ SOLN
INTRAMUSCULAR | Status: DC | PRN
  Administered 2017-12-02: 4 mg via INTRAVENOUS

## 2017-12-02 MED ORDER — OXYCODONE-ACETAMINOPHEN 5-325 MG PO TABS: 1.0000 | ORAL_TABLET | Freq: Four times a day (QID) | ORAL | 0 refills | Status: AC | PRN

## 2017-12-02 MED ORDER — SODIUM CHLORIDE 0.9 % IV SOLN
INTRAVENOUS | Status: DC | PRN
  Administered 2017-12-02: 25 ug/min via INTRAVENOUS

## 2017-12-02 MED ORDER — PROPOFOL 10 MG/ML IV BOLUS
INTRAVENOUS | Status: DC | PRN
  Administered 2017-12-02: 100 mg via INTRAVENOUS
  Administered 2017-12-02: 50 mg via INTRAVENOUS

## 2017-12-02 MED ORDER — GLYCOPYRROLATE 0.2 MG/ML IJ SOLN
INTRAMUSCULAR | Status: DC | PRN
  Administered 2017-12-02: 0.2 mg via INTRAVENOUS

## 2017-12-02 MED ORDER — VASOPRESSIN 20 UNIT/ML IV SOLN
INTRAVENOUS | Status: DC | PRN
  Administered 2017-12-02 (×4): 1 [IU] via INTRAVENOUS

## 2017-12-02 MED ORDER — FAMOTIDINE 20 MG PO TABS
20.0000 mg | ORAL_TABLET | Freq: Once | ORAL | Status: AC
  Administered 2017-12-02: 20 mg via ORAL

## 2017-12-02 MED ORDER — HEPARIN SODIUM (PORCINE) 1000 UNIT/ML IJ SOLN
INTRAMUSCULAR | Status: AC
  Filled 2017-12-02: qty 1

## 2017-12-02 MED ORDER — HYDROCODONE-ACETAMINOPHEN 5-325 MG PO TABS
ORAL_TABLET | ORAL | Status: AC
  Filled 2017-12-02: qty 1

## 2017-12-02 MED ORDER — PROPOFOL 10 MG/ML IV BOLUS
INTRAVENOUS | Status: AC
  Filled 2017-12-02: qty 20

## 2017-12-02 MED ORDER — CEFAZOLIN SODIUM-DEXTROSE 2-4 GM/100ML-% IV SOLN
INTRAVENOUS | Status: AC
  Filled 2017-12-02: qty 100

## 2017-12-02 MED ORDER — FENTANYL CITRATE (PF) 100 MCG/2ML IJ SOLN
INTRAMUSCULAR | Status: AC
  Filled 2017-12-02: qty 2

## 2017-12-02 MED ORDER — GLYCOPYRROLATE 0.2 MG/ML IJ SOLN
INTRAMUSCULAR | Status: AC
  Filled 2017-12-02: qty 1

## 2017-12-02 MED ORDER — SODIUM CHLORIDE 0.9 % IV SOLN
INTRAVENOUS | Status: DC
  Administered 2017-12-02: 11:00:00 via INTRAVENOUS

## 2017-12-02 MED ORDER — FENTANYL CITRATE (PF) 100 MCG/2ML IJ SOLN
INTRAMUSCULAR | Status: DC | PRN
  Administered 2017-12-02 (×2): 25 ug via INTRAVENOUS
  Administered 2017-12-02 (×2): 50 ug via INTRAVENOUS

## 2017-12-02 MED ORDER — DEXAMETHASONE SODIUM PHOSPHATE 10 MG/ML IJ SOLN
INTRAMUSCULAR | Status: DC | PRN
  Administered 2017-12-02: 10 mg via INTRAVENOUS

## 2017-12-02 MED ORDER — MIDAZOLAM HCL 2 MG/2ML IJ SOLN
INTRAMUSCULAR | Status: AC
  Filled 2017-12-02: qty 2

## 2017-12-02 MED ORDER — LIDOCAINE HCL (CARDIAC) 20 MG/ML IV SOLN
INTRAVENOUS | Status: DC | PRN
  Administered 2017-12-02: 100 mg via INTRAVENOUS

## 2017-12-02 MED ORDER — ATROPINE SULFATE 0.4 MG/ML IJ SOLN
INTRAMUSCULAR | Status: DC | PRN
  Administered 2017-12-02: 0.4 mg via INTRAVENOUS

## 2017-12-02 MED ORDER — MIDAZOLAM HCL 2 MG/2ML IJ SOLN
INTRAMUSCULAR | Status: DC | PRN
  Administered 2017-12-02: 2 mg via INTRAVENOUS

## 2017-12-02 MED ORDER — FAMOTIDINE 20 MG PO TABS
ORAL_TABLET | ORAL | Status: AC
  Administered 2017-12-02: 20 mg via ORAL
  Filled 2017-12-02: qty 1

## 2017-12-02 MED ORDER — HYDROCODONE-ACETAMINOPHEN 5-325 MG PO TABS
1.0000 | ORAL_TABLET | Freq: Four times a day (QID) | ORAL | Status: DC | PRN
  Administered 2017-12-02: 1 via ORAL

## 2017-12-02 MED ORDER — ONDANSETRON HCL 4 MG/2ML IJ SOLN: 4.0000 mg | Freq: Once | INTRAMUSCULAR | Status: DC | PRN

## 2017-12-02 SURGICAL SUPPLY — 65 items
APPLIER CLIP 11 MED OPEN (CLIP)
APPLIER CLIP 9.375 SM OPEN (CLIP)
BAG DECANTER FOR FLEXI CONT (MISCELLANEOUS) ×3 IMPLANT
BLADE SURG SZ11 CARB STEEL (BLADE) ×3 IMPLANT
BOOT SUTURE AID YELLOW STND (SUTURE) ×6 IMPLANT
BRUSH SCRUB EZ  4% CHG (MISCELLANEOUS) ×2
BRUSH SCRUB EZ 4% CHG (MISCELLANEOUS) ×1 IMPLANT
CANISTER SUCT 1200ML W/VALVE (MISCELLANEOUS) ×3 IMPLANT
CHLORAPREP W/TINT 26ML (MISCELLANEOUS) ×3 IMPLANT
CLIP APPLIE 11 MED OPEN (CLIP) IMPLANT
CLIP APPLIE 9.375 SM OPEN (CLIP) IMPLANT
CLIP VESOCCLUDE MED 6/CT (CLIP) IMPLANT
CLIP VESOCCLUDE SM WIDE 6/CT (CLIP) IMPLANT
DERMABOND ADVANCED (GAUZE/BANDAGES/DRESSINGS) ×6
DERMABOND ADVANCED .7 DNX12 (GAUZE/BANDAGES/DRESSINGS) ×3 IMPLANT
DRESSING SURGICEL FIBRLLR 1X2 (HEMOSTASIS) ×1 IMPLANT
DRSG OPSITE POSTOP 4X6 (GAUZE/BANDAGES/DRESSINGS) ×3 IMPLANT
DRSG OPSITE POSTOP 4X8 (GAUZE/BANDAGES/DRESSINGS) ×3 IMPLANT
DRSG SURGICEL FIBRILLAR 1X2 (HEMOSTASIS) ×3
ELECT CAUTERY BLADE 6.4 (BLADE) ×3 IMPLANT
ELECT REM PT RETURN 9FT ADLT (ELECTROSURGICAL) ×3
ELECTRODE REM PT RTRN 9FT ADLT (ELECTROSURGICAL) ×1 IMPLANT
GAUZE SPONGE 4X4 12PLY STRL (GAUZE/BANDAGES/DRESSINGS) ×6 IMPLANT
GEL ULTRASOUND 20GR AQUASONIC (MISCELLANEOUS) IMPLANT
GLOVE BIO SURGEON STRL SZ7 (GLOVE) ×6 IMPLANT
GLOVE INDICATOR 7.5 STRL GRN (GLOVE) ×3 IMPLANT
GLOVE SURG SYN 8.0 (GLOVE) ×3 IMPLANT
GOWN STRL REUS W/ TWL LRG LVL3 (GOWN DISPOSABLE) ×1 IMPLANT
GOWN STRL REUS W/ TWL XL LVL3 (GOWN DISPOSABLE) ×1 IMPLANT
GOWN STRL REUS W/TWL LRG LVL3 (GOWN DISPOSABLE) ×2
GOWN STRL REUS W/TWL XL LVL3 (GOWN DISPOSABLE) ×2
IV NS 500ML (IV SOLUTION) ×2
IV NS 500ML BAXH (IV SOLUTION) ×1 IMPLANT
KIT RM TURNOVER STRD PROC AR (KITS) ×3 IMPLANT
LABEL OR SOLS (LABEL) ×3 IMPLANT
LOOP RED MAXI  1X406MM (MISCELLANEOUS) ×2
LOOP VESSEL MAXI 1X406 RED (MISCELLANEOUS) ×1 IMPLANT
LOOP VESSEL MINI 0.8X406 BLUE (MISCELLANEOUS) ×2 IMPLANT
LOOPS BLUE MINI 0.8X406MM (MISCELLANEOUS) ×4
NEEDLE FILTER BLUNT 18X 1/2SAF (NEEDLE) ×2
NEEDLE FILTER BLUNT 18X1 1/2 (NEEDLE) ×1 IMPLANT
NEEDLE HYPO 30X.5 LL (NEEDLE) IMPLANT
NS IRRIG 500ML POUR BTL (IV SOLUTION) ×3 IMPLANT
PACK EXTREMITY ARMC (MISCELLANEOUS) ×3 IMPLANT
PAD PREP 24X41 OB/GYN DISP (PERSONAL CARE ITEMS) IMPLANT
PUNCH SURGICAL ROTATE 2.7MM (MISCELLANEOUS) IMPLANT
STOCKINETTE STRL 4IN 9604848 (GAUZE/BANDAGES/DRESSINGS) ×3 IMPLANT
SUT ETHIBOND 0 36 GRN (SUTURE) IMPLANT
SUT MNCRL 4-0 (SUTURE) ×2
SUT MNCRL 4-0 27XMFL (SUTURE) ×1
SUT MNCRL+ 5-0 UNDYED PC-3 (SUTURE) ×1 IMPLANT
SUT MONOCRYL 5-0 (SUTURE) ×2
SUT PROLENE 6 0 BV (SUTURE) ×12 IMPLANT
SUT SILK 2 0 (SUTURE) ×2
SUT SILK 2-0 18XBRD TIE 12 (SUTURE) ×1 IMPLANT
SUT SILK 3 0 (SUTURE) ×2
SUT SILK 3-0 18XBRD TIE 12 (SUTURE) ×1 IMPLANT
SUT SILK 4 0 (SUTURE) ×2
SUT SILK 4-0 18XBRD TIE 12 (SUTURE) ×1 IMPLANT
SUT VIC AB 3-0 SH 27 (SUTURE) ×8
SUT VIC AB 3-0 SH 27X BRD (SUTURE) ×4 IMPLANT
SUTURE MNCRL 4-0 27XMF (SUTURE) ×1 IMPLANT
SYR 20CC LL (SYRINGE) ×3 IMPLANT
SYR 3ML LL SCALE MARK (SYRINGE) ×3 IMPLANT
TOWEL OR 17X26 4PK STRL BLUE (TOWEL DISPOSABLE) IMPLANT

## 2017-12-02 NOTE — Transfer of Care (Signed)
Immediate Anesthesia Transfer of Care Note  Patient: Jeremy Rose  Procedure(s) Performed: ARTERIOVENOUS (AV) FISTULA CREATION ( BRACHIAL CEPHALIC ) (Left ) LIGATION OF ARTERIOVENOUS  FISTULA ( REMOVAL LEFT WRIST FISTULA ) (Left )  Patient Location: PACU  Anesthesia Type:General  Level of Consciousness: sedated  Airway & Oxygen Therapy: Patient Spontanous Breathing and Patient connected to face mask oxygen  Post-op Assessment: Report given to RN and Post -op Vital signs reviewed and stable  Post vital signs: Reviewed and stable  Last Vitals:  Vitals:   12/02/17 1028  BP: 111/71  Pulse: (!) 51  Resp: 16  Temp: (!) 35.4 C  SpO2: 100%    Last Pain:  Vitals:   12/02/17 1028  TempSrc: Tympanic         Complications: No apparent anesthesia complications

## 2017-12-02 NOTE — Anesthesia Postprocedure Evaluation (Signed)
Anesthesia Post Note  Patient: Jeremy Rose  Procedure(s) Performed: ARTERIOVENOUS (AV) FISTULA CREATION ( BRACHIAL CEPHALIC ) (Left ) LIGATION OF ARTERIOVENOUS  FISTULA ( REMOVAL LEFT WRIST FISTULA ) (Left )  Patient location during evaluation: PACU Anesthesia Type: General Level of consciousness: awake and alert Pain management: pain level controlled Vital Signs Assessment: post-procedure vital signs reviewed and stable Respiratory status: spontaneous breathing, nonlabored ventilation, respiratory function stable and patient connected to nasal cannula oxygen Cardiovascular status: blood pressure returned to baseline and stable Postop Assessment: no apparent nausea or vomiting Anesthetic complications: no     Last Vitals:  Vitals:   12/02/17 1411 12/02/17 1420  BP: 106/64 99/64  Pulse: 70 73  Resp: 14 20  Temp:    SpO2: 99% 100%    Last Pain:  Vitals:   12/02/17 1420  TempSrc:   PainSc: [place] S

## 2017-12-02 NOTE — H&P (Signed)
[facility] History & Physical Update  The patient was interviewed and re-examined.  The patient's previous History and Physical has been reviewed and is unchanged.  There is no change in the plan of care. We plan to proceed with the scheduled procedure.  Hortencia Pilar, MD  12/02/2017, 11:05 AM

## 2017-12-02 NOTE — Anesthesia Preprocedure Evaluation (Signed)
Anesthesia Evaluation  Patient identified by MRN, date of birth, ID band Patient awake    Reviewed: Allergy & Precautions, NPO status , Patient's Chart, lab work & pertinent test results, reviewed documented beta blocker date and time   Airway Mallampati: III  TM Distance: >3 FB     Dental  (+) Chipped   Pulmonary former smoker,           Cardiovascular hypertension, Pt. on medications and Pt. on home beta blockers + CAD       Neuro/Psych Seizures -, Well Controlled,     GI/Hepatic GERD  Controlled,  Endo/Other    Renal/GU Renal disease     Musculoskeletal   Abdominal   Peds  Hematology   Anesthesia Other Findings   Reproductive/Obstetrics                             Anesthesia Physical Anesthesia Plan  ASA: III  Anesthesia Plan: General   Post-op Pain Management:    Induction: Intravenous  PONV Risk Score and Plan:   Airway Management Planned: LMA  Additional Equipment:   Intra-op Plan:   Post-operative Plan:   Informed Consent: I have reviewed the patients History and Physical, chart, labs and discussed the procedure including the risks, benefits and alternatives for the proposed anesthesia with the patient or authorized representative who has indicated his/her understanding and acceptance.     Plan Discussed with: CRNA  Anesthesia Plan Comments:         Anesthesia Quick Evaluation

## 2017-12-02 NOTE — Anesthesia Post-op Follow-up Note (Signed)
Anesthesia QCDR form completed.        

## 2017-12-02 NOTE — Anesthesia Procedure Notes (Signed)
Procedure Name: LMA Insertion Date/Time: 12/02/2017 11:50 AM Performed by: Nelda Marseille, CRNA Pre-anesthesia Checklist: Patient identified, Patient being monitored, Timeout performed, Emergency Drugs available and Suction available Patient Re-evaluated:Patient Re-evaluated prior to induction Oxygen Delivery Method: Circle system utilized Preoxygenation: Pre-oxygenation with 100% oxygen Induction Type: IV induction Ventilation: Mask ventilation without difficulty LMA: LMA inserted LMA Size: 4.0 Tube type: Oral Number of attempts: 1 Placement Confirmation: positive ETCO2 and breath sounds checked- equal and bilateral Tube secured with: Tape Dental Injury: Teeth and Oropharynx as per pre-operative assessment

## 2017-12-02 NOTE — Discharge Instructions (Signed)

## 2017-12-02 NOTE — Op Note (Signed)
OPERATIVE NOTE   PROCEDURE: 1.  left brachial cephalic arteriovenous fistula placement 2.  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 male who presents with end stage renal disease.  The patient is scheduled for left brachiocephalic arteriovenous fistula placement.  The patient is aware the risks include but are not limited to: bleeding, infection, steal syndrome, nerve damage, ischemic monomelic neuropathy, failure to mature, and need for additional procedures.  The patient is aware of the risks of the procedure and elects to proceed forward.  DESCRIPTION: After full informed written consent was obtained from the patient, the patient was brought back to the operating room and placed supine upon the operating table.  Prior to induction, the patient received IV antibiotics.   After obtaining adequate anesthesia, the patient was then prepped and draped in the standard fashion for a left arm access procedure.    A curvilinear incision was then created midway between the radial impulse and the cephalic vein. The cephalic vein was then identified and dissected circumferentially. It was marked with a surgical marker.    Attention was then turned to the brachial artery which was exposed through the same incision and looped proximally and distally. Side branches were controlled with 4-0 silk ties.  The distal segment of the vein was ligated with a  2-0 silk, and the vein was transected.  The proximal segment was interrogated with serial dilators.  The vein accepted up to a 8 mm dilator  without any difficulty. Heparinized saline was infused into the vein and clamped it with a small bulldog.  At this point, I reset my exposure of the brachial artery and controlled the artery with vessel loops proximally and distally.  An arteriotomy was then made with a #11 blade, and extended with a Potts scissor.  Heparinized saline was injected proximal and distal into the radial artery.  The vein was then approximated to the artery while the artery was in its native bed and subsequently the vein was beveled using Potts scissors. The vein was then sewn to the artery in an end-to-side configuration with a running stitch of 6-0 Prolene.  Prior to completing this anastomosis Flushing maneuvers were performed and the artery was allowed to forward and back bleed.  There was no evidence of clot from any vessels.  I completed the anastomosis in the usual fashion and then released all vessel loops and clamps.   Attention was then turned to the aneurysmal radiocephalic portion of the fistula.  Elliptical skin incision was made in the previous anastomosis dissected out circumferentially.  A profunda clamp was then placed across the vein at the level of the anastomosis the vein ligated proximally with a 2-0 silk and then transected.  5-0 Prolene was then used to close the venotomy using a horizontal mattress followed by an over under stitch.  The fistula was then dissected circumferentially just distal to the antecubital fossa and ligated and divided between 2-0 silk suture.  The aneurysmal fistula was then resected en bloc and passed off the field as specimen.  Hemostasis was then achieved with Bovie cautery and 3-0 Vicryl interrupted figure-of-eight  sutures.  The wound was then irrigated reevaluated for hemostasis and then closed in layers using 3-0 Vicryl in a running fashion followed by 4-0 Monocryl in a subcuticular fashion.  There was good  thrill in the venous outflow, and there was 1+ palpable radial pulse.  At  this point, I irrigated out the antecubital surgical wound.  There was no further active bleeding.  The subcutaneous tissue was reapproximated with a running stitch of 3-0 Vicryl.  The skin was then reapproximated with a running subcuticular stitch of 4-0 Vicryl.  The skin was then cleaned, dried, and reinforced with Dermabond.    The patient tolerated this procedure well.   COMPLICATIONS: None  CONDITION: [name]  Office: [phone]   12/02/2017, 2:00 PM

## 2017-12-03 ENCOUNTER — Encounter: Payer: Self-pay | Admitting: Vascular Surgery

## 2017-12-04 LAB — SURGICAL PATHOLOGY

## 2017-12-17 ENCOUNTER — Other Ambulatory Visit (INDEPENDENT_AMBULATORY_CARE_PROVIDER_SITE_OTHER): Payer: Self-pay | Admitting: Vascular Surgery

## 2017-12-17 DIAGNOSIS — N185 Chronic kidney disease, stage 5: Secondary | ICD-10-CM

## 2017-12-21 ENCOUNTER — Encounter (INDEPENDENT_AMBULATORY_CARE_PROVIDER_SITE_OTHER): Payer: Medicare PPO

## 2017-12-21 ENCOUNTER — Ambulatory Visit (INDEPENDENT_AMBULATORY_CARE_PROVIDER_SITE_OTHER): Payer: Medicare PPO | Admitting: Vascular Surgery

## 2018-01-07 ENCOUNTER — Ambulatory Visit (INDEPENDENT_AMBULATORY_CARE_PROVIDER_SITE_OTHER): Payer: Medicare PPO | Admitting: Podiatry

## 2018-01-07 ENCOUNTER — Encounter: Payer: Self-pay | Admitting: Podiatry

## 2018-01-07 DIAGNOSIS — M79609 Pain in unspecified limb: Principal | ICD-10-CM

## 2018-01-07 DIAGNOSIS — B351 Tinea unguium: Secondary | ICD-10-CM | POA: Diagnosis not present

## 2018-01-07 DIAGNOSIS — Q828 Other specified congenital malformations of skin: Secondary | ICD-10-CM | POA: Diagnosis not present

## 2018-01-07 DIAGNOSIS — N186 End stage renal disease: Secondary | ICD-10-CM

## 2018-01-07 DIAGNOSIS — M79676 Pain in unspecified toe(s): Secondary | ICD-10-CM | POA: Diagnosis not present

## 2018-01-07 DIAGNOSIS — L84 Corns and callosities: Secondary | ICD-10-CM

## 2018-01-07 NOTE — Progress Notes (Signed)
This patient presents the office with chief complaint of elongated, thickened toenails.  He says his fourth toenail left foot is especially long and painful.  He also says that he is having difficulty walking due to the multiple calluses on both feet.  He presents the office today for preventative foot care services.  General Appearance  Alert, conversant and in no acute stress.  Vascular  Dorsalis pedis and posterior pulses are palpable  bilaterally.  Capillary return is within normal limits  bilaterally. Temperature is within normal limits  Bilaterally.  Neurologic  Senn-Weinstein monofilament wire test within normal limits  bilaterally. Muscle power within normal limits bilaterally.  Nails Thick disfigured discolored nails with subungual debris bilaterally from hallux to fifth toes bilaterally. No evidence of bacterial infection or drainage bilaterally.  Orthopedic  No limitations of motion of motion feet bilaterally.  No crepitus or effusions noted.  HAV  B/L.  Skin  normotropic skin with no porokeratosis noted bilaterally.  No signs of infections or ulcers noted.  Callus sub 1,5  B/L.  Pinch callus hallux  B/L  Onychomycosis  B/L  Callus  B/L.  Debridement of nails  B/L.   Debridement of callus  B/L. RTC 3 months    Gardiner Barefoot DPM

## 2018-03-30 ENCOUNTER — Encounter (INDEPENDENT_AMBULATORY_CARE_PROVIDER_SITE_OTHER): Payer: Self-pay | Admitting: Vascular Surgery

## 2018-03-30 ENCOUNTER — Ambulatory Visit (INDEPENDENT_AMBULATORY_CARE_PROVIDER_SITE_OTHER): Payer: Medicare PPO

## 2018-03-30 ENCOUNTER — Ambulatory Visit (INDEPENDENT_AMBULATORY_CARE_PROVIDER_SITE_OTHER): Payer: Medicare PPO | Admitting: Vascular Surgery

## 2018-03-30 ENCOUNTER — Encounter (INDEPENDENT_AMBULATORY_CARE_PROVIDER_SITE_OTHER): Payer: Self-pay

## 2018-03-30 VITALS — BP 113/71 | HR 79 | Resp 12 | Ht 67.5 in | Wt 162.0 lb

## 2018-03-30 DIAGNOSIS — N185 Chronic kidney disease, stage 5: Secondary | ICD-10-CM | POA: Diagnosis not present

## 2018-03-30 DIAGNOSIS — Z992 Dependence on renal dialysis: Secondary | ICD-10-CM

## 2018-03-30 DIAGNOSIS — N186 End stage renal disease: Secondary | ICD-10-CM | POA: Diagnosis not present

## 2018-03-30 DIAGNOSIS — T829XXS Unspecified complication of cardiac and vascular prosthetic device, implant and graft, sequela: Secondary | ICD-10-CM

## 2018-03-30 NOTE — Progress Notes (Signed)
Subjective:    Patient ID: Jeremy Rose, male    DOB: [birth year], 62 y.o.   MRN: [id number] Chief Complaint  Patient presents with  . Follow-up    HDA u/s follow up   Patient presents at his request for progressively painful left upper extremity fistula.  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  The patient has not followed up since that procedure.  The patient presents today with pain at the fistula site.  He notes pain that his dialysis access during an not during dialysis.  The patient also is experiencing left hand pain during and not during dialysis as well.  Patient continues to experience prolonged bleeding after dialysis treatments.  The patient notes that his aneurysmal fistula has now become bigger and tracking down into the antecubital fossa.  Patient notes that his fistula skin is intact at this time.  The patient denies any uremic symptoms.  The patient denies any fever, nausea vomiting.denies any uremic symptoms.  The patient denies any fever, nausea vomiting.   Review of Systems  Constitutional: Negative.   HENT: Negative.   Eyes: Negative.   Respiratory: Negative.   Cardiovascular: Negative.   Gastrointestinal: Negative.   Endocrine: Negative.   Genitourinary:       ESRD  Musculoskeletal: Negative.   Skin: Negative.   Allergic/Immunologic: Negative.   Neurological: Negative.   Hematological: Negative.   Psychiatric/Behavioral: Negative.       Objective:   Physical Exam  Constitutional: He is oriented to person, place, and time. He appears well-developed and well-nourished. No distress.  HENT:  Head: Normocephalic and atraumatic.  Right Ear: External ear normal.  Left Ear: External ear normal.  Eyes: Pupils are equal, round, and reactive to light. Conjunctivae and EOM are normal.    Neck: Normal range of motion.  Cardiovascular: Normal rate, regular rhythm, normal heart sounds and intact distal pulses.  Pulses:      Radial pulses are 2+ on the right side, and 2+ on the left side.  Left upper extremity dialysis access: Very aneurysmal.  Aneurysmal the torturous and tracking down to the antecubital fossa.  Thinning of skin threatening is noted.  Good bruit and thrill.  Pulmonary/Chest: Effort normal and breath sounds normal.  Musculoskeletal: Normal range of motion. He exhibits no edema.  Neurological: He is alert and oriented to person, place, and time.  Skin: Skin is warm and dry. He is not diaphoretic.  Psychiatric: He has a normal mood and affect. His behavior is normal. Judgment and thought content normal.  Vitals reviewed.  BP 113/71 (BP Location: Right Arm, Patient Position: Sitting)   Pulse 79   Resp 12   Ht 5' 7.5" (1.715 m)   Wt 162 lb (73.5 kg)   BMI 25.00 kg/m   Past Medical History:  Diagnosis Date  . Coronary artery disease   . Dialysis patient ([place])    T, TH, S  . GERD (gastroesophageal reflux disease)   . Hypertension   . Renal disorder   . Seizures ([place])    Social History   Socioeconomic History  . Marital status: Single    Spouse name: Not on file  . Number of children: Not on file  . Years of education: Not on file  . Highest education level: Not on file  Occupational History  . Not on file  Social  Needs  . Financial resource strain: Not on file  . Food insecurity:    Worry: Not on file    Inability: Not on file  . Transportation needs:    Medical: Not on file    Non-medical: Not on file  Tobacco Use  . Smoking status: Former Research scientist (life sciences)  . Smokeless tobacco: Former Network engineer and Sexual Activity  . Alcohol use: No  . Drug use: No  . Sexual activity: Not on file  Lifestyle  . Physical activity:    Days per week: Not on file    Minutes per session: Not on file  . Stress: Not on file  Relationships  . Social  connections:    Talks on phone: Not on file    Gets together: Not on file    Attends religious service: Not on file    Active member of club or organization: Not on file    Attends meetings of clubs or organizations: Not on file    Relationship status: Not on file  . Intimate partner violence:    Fear of current or ex partner: Not on file    Emotionally abused: Not on file    Physically abused: Not on file    Forced sexual activity: Not on file  Other Topics Concern  . Not on file  Social History Narrative  . Not on file   Past Surgical History:  Procedure Laterality Date  . A/V FISTULAGRAM Left 02/24/2017   Procedure: A/V Fistulagram;  Surgeon: Katha Cabal, MD;  Location: [facility];  Service: Cardiovascular;  Laterality: Left;  . A/V SHUNT INTERVENTION N/A 02/24/2017   Procedure: A/V Shunt Intervention;  Surgeon: Katha Cabal, MD;  Location: [facility];  Service: Cardiovascular;  Laterality: N/A;  . AV FISTULA PLACEMENT Left   . AV FISTULA PLACEMENT Left 12/02/2017   Procedure: ARTERIOVENOUS (AV) FISTULA CREATION ( BRACHIAL CEPHALIC );  Surgeon: Katha Cabal, MD;  Location: [facility];  Service: Vascular;  Laterality: Left;  . CARDIAC CATHETERIZATION    . LIGATION OF ARTERIOVENOUS  FISTULA Left 12/02/2017   Procedure: LIGATION OF ARTERIOVENOUS  FISTULA ( REMOVAL LEFT WRIST FISTULA );  Surgeon: Katha Cabal, MD;  Location: [facility];  Service: Vascular;  Laterality: Left;  . PERIPHERAL VASCULAR CATHETERIZATION Left 06/03/2016   Procedure: A/V Shuntogram/Fistulagram;  Surgeon: Katha Cabal, MD;  Location: [facility];  Service: Cardiovascular;  Laterality: Left;  . PERIPHERAL VASCULAR CATHETERIZATION  06/03/2016   Procedure: Peripheral Vascular Balloon Angioplasty;  Surgeon: Katha Cabal, MD;  Location: [facility];  Service: Cardiovascular;;   Family History  Problem Relation Age of Onset  . Heart disease Mother   .  Kidney disease Paternal Uncle    Allergies  Allergen Reactions  . Codeine Nausea And Vomiting  . Doxycycline Nausea And Vomiting      Assessment & Plan:  Patient presents at his request for progressively painful left upper extremity fistula.  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  The patient has not followed up since that procedure.  The patient presents today with pain at the fistula site.  He notes pain that his dialysis access during an not during dialysis.  The patient also is experiencing left hand pain during and not during dialysis as well.  Patient continues to experience  prolonged bleeding after dialysis treatments.  The patient notes that his aneurysmal fistula has now become bigger and tracking down into the antecubital fossa.  Patient notes that his fistula skin is intact at this time.  The patient denies any uremic symptoms.  The patient denies any fever, nausea vomiting.  1. ESRD (end stage renal disease) on dialysis ([place]) - Stable Patient with progressively worsening to the fistula site. Patient with pain during and not during dialysis. Patient also experiencing hand pain and numbness during and not during dialysis as well. Prolonged bleeding issues. Patient with a total flow volume of 2886 on today's duplex relatively uniform velocities The patient's fistula is aneurysmal and tortuous there are 2 competing branches at the shoulder and proximal UA. Recommend a bilateral venogram to assess the patient's anatomy in preparation for possible revision as this fistula continues to cause pain and issues for the patient. Procedure, risks and benefits explained to the patient.  All questions answered.  The patient wishes to move forward.  2. Complication of arteriovenous dialysis fistula, sequela - Stable As  above  Current Outpatient Medications on File Prior to Visit  Medication Sig Dispense Refill  . albuterol (PROVENTIL HFA;VENTOLIN HFA) 108 (90 BASE) MCG/ACT inhaler Inhale into the lungs every 6 (six) hours as needed for wheezing or shortness of breath.    . allopurinol (ZYLOPRIM) 100 MG tablet Take 200 mg by mouth daily.    Marland Kitchen aspirin EC 81 MG tablet Take 81 mg by mouth daily.    . beclomethasone (QVAR) 80 MCG/ACT inhaler Inhale 1 puff into the lungs as needed (for shortness of breath).    . carvedilol (COREG) 25 MG tablet Take 25 mg by mouth 2 (two) times daily.     . cinacalcet (SENSIPAR) 60 MG tablet Take 120 mg by mouth daily.     . cyanocobalamin 1000 MCG tablet Take 1,000 mcg daily by mouth.    . felodipine (PLENDIL) 2.5 MG 24 hr tablet Take 2.5 mg by mouth daily.     . folic acid (FOLVITE) 299 MCG tablet Take 400 mcg by mouth daily.    Marland Kitchen ibuprofen (ADVIL,MOTRIN) 200 MG tablet Take 200 mg by mouth daily as needed for headache or moderate pain.    . Lacosamide (VIMPAT) 150 MG TABS Take 150 mg by mouth 2 (two) times daily. Take an 3rd dose after dialysis on M-W-F    . levETIRAcetam (KEPPRA) 500 MG tablet Take 500 mg by mouth 2 (two) times daily. Take a 3rd dose of 500 mgs after dialysis only on dialysis days (M-W-F)    . lidocaine-prilocaine (EMLA) cream Apply 1 application topically daily as needed (port access).     Marland Kitchen omeprazole (PRILOSEC) 20 MG capsule Take 20 mg by mouth daily.    . sevelamer carbonate (RENVELA) 800 MG tablet Take 800-3,200 mg by mouth See admin instructions. Take 3200 mg with each meal and 800 mg with each snack     No current facility-administered medications on file prior to visit.     There are no Patient Instructions on file for this visit. No follow-ups on file.   KIMBERLY A STEGMAYER, PA-C

## 2018-04-06 ENCOUNTER — Other Ambulatory Visit (INDEPENDENT_AMBULATORY_CARE_PROVIDER_SITE_OTHER): Payer: Self-pay | Admitting: Vascular Surgery

## 2018-04-12 MED ORDER — CEFAZOLIN SODIUM-DEXTROSE 1-4 GM/50ML-% IV SOLN
1.0000 g | Freq: Once | INTRAVENOUS | Status: AC
  Administered 2018-04-13: 1 g via INTRAVENOUS

## 2018-04-13 ENCOUNTER — Encounter
Admission: RE | Disposition: A | Discharge status: Home / Self Care | Payer: Self-pay | Source: Ambulatory Visit | Attending: Vascular Surgery

## 2018-04-13 ENCOUNTER — Encounter: Payer: Self-pay | Admitting: *Deleted

## 2018-04-13 ENCOUNTER — Ambulatory Visit
Admission: RE | Admit: 2018-04-13 | Discharge: 2018-04-13 | Disposition: A | Discharge status: Home / Self Care | Payer: Medicare PPO | Source: Ambulatory Visit | Attending: Vascular Surgery | Admitting: Vascular Surgery

## 2018-04-13 DIAGNOSIS — Z8249 Family history of ischemic heart disease and other diseases of the circulatory system: Secondary | ICD-10-CM | POA: Diagnosis not present

## 2018-04-13 DIAGNOSIS — I2510 Atherosclerotic heart disease of native coronary artery without angina pectoris: Secondary | ICD-10-CM | POA: Insufficient documentation

## 2018-04-13 DIAGNOSIS — Z9889 Other specified postprocedural states: Secondary | ICD-10-CM | POA: Insufficient documentation

## 2018-04-13 DIAGNOSIS — Z888 Allergy status to other drugs, medicaments and biological substances status: Secondary | ICD-10-CM | POA: Insufficient documentation

## 2018-04-13 DIAGNOSIS — Z881 Allergy status to other antibiotic agents status: Secondary | ICD-10-CM | POA: Diagnosis not present

## 2018-04-13 DIAGNOSIS — T82868A Thrombosis of vascular prosthetic devices, implants and grafts, initial encounter: Secondary | ICD-10-CM

## 2018-04-13 DIAGNOSIS — Z79899 Other long term (current) drug therapy: Secondary | ICD-10-CM | POA: Diagnosis not present

## 2018-04-13 DIAGNOSIS — K219 Gastro-esophageal reflux disease without esophagitis: Secondary | ICD-10-CM | POA: Diagnosis not present

## 2018-04-13 DIAGNOSIS — Z992 Dependence on renal dialysis: Secondary | ICD-10-CM | POA: Insufficient documentation

## 2018-04-13 DIAGNOSIS — Z87891 Personal history of nicotine dependence: Secondary | ICD-10-CM | POA: Insufficient documentation

## 2018-04-13 DIAGNOSIS — I120 Hypertensive chronic kidney disease with stage 5 chronic kidney disease or end stage renal disease: Secondary | ICD-10-CM | POA: Diagnosis not present

## 2018-04-13 DIAGNOSIS — T82590A Other mechanical complication of surgically created arteriovenous fistula, initial encounter: Secondary | ICD-10-CM | POA: Insufficient documentation

## 2018-04-13 DIAGNOSIS — N186 End stage renal disease: Secondary | ICD-10-CM | POA: Insufficient documentation

## 2018-04-13 DIAGNOSIS — Z841 Family history of disorders of kidney and ureter: Secondary | ICD-10-CM | POA: Diagnosis not present

## 2018-04-13 DIAGNOSIS — Y832 Surgical operation with anastomosis, bypass or graft as the cause of abnormal reaction of the patient, or of later complication, without mention of misadventure at the time of the procedure: Secondary | ICD-10-CM | POA: Insufficient documentation

## 2018-04-13 DIAGNOSIS — Z7982 Long term (current) use of aspirin: Secondary | ICD-10-CM | POA: Insufficient documentation

## 2018-04-13 HISTORY — PX: UPPER EXTREMITY VENOGRAPHY: CATH118272

## 2018-04-13 HISTORY — DX: Gout, unspecified: M10.9

## 2018-04-13 HISTORY — DX: Unspecified osteoarthritis, unspecified site: M19.90

## 2018-04-13 LAB — POTASSIUM (ARMC VASCULAR LAB ONLY): POTASSIUM (ARMC VASCULAR LAB): 4.4 (ref 3.5–5.1)

## 2018-04-13 SURGERY — UPPER EXTREMITY VENOGRAPHY
Anesthesia: Moderate Sedation

## 2018-04-13 MED ORDER — MIDAZOLAM HCL 5 MG/5ML IJ SOLN
INTRAMUSCULAR | Status: AC
  Filled 2018-04-13: qty 5

## 2018-04-13 MED ORDER — HEPARIN SODIUM (PORCINE) 1000 UNIT/ML IJ SOLN
INTRAMUSCULAR | Status: AC
  Filled 2018-04-13: qty 1

## 2018-04-13 MED ORDER — ONDANSETRON HCL 4 MG/2ML IJ SOLN: 4.0000 mg | Freq: Four times a day (QID) | INTRAMUSCULAR | Status: DC | PRN

## 2018-04-13 MED ORDER — CEFAZOLIN SODIUM-DEXTROSE 1-4 GM/50ML-% IV SOLN
INTRAVENOUS | Status: AC
  Administered 2018-04-13: 1 g via INTRAVENOUS
  Filled 2018-04-13: qty 50

## 2018-04-13 MED ORDER — FENTANYL CITRATE (PF) 100 MCG/2ML IJ SOLN
INTRAMUSCULAR | Status: DC | PRN
  Administered 2018-04-13: 50 ug via INTRAVENOUS

## 2018-04-13 MED ORDER — FENTANYL CITRATE (PF) 100 MCG/2ML IJ SOLN
INTRAMUSCULAR | Status: AC
  Filled 2018-04-13: qty 2

## 2018-04-13 MED ORDER — IOPAMIDOL (ISOVUE-300) INJECTION 61%
INTRAVENOUS | Status: DC | PRN
  Administered 2018-04-13: 35 mL via INTRA_ARTERIAL

## 2018-04-13 MED ORDER — LIDOCAINE HCL (PF) 1 % IJ SOLN
INTRAMUSCULAR | Status: AC
  Filled 2018-04-13: qty 30

## 2018-04-13 MED ORDER — HYDROMORPHONE HCL 1 MG/ML IJ SOLN: 1.0000 mg | Freq: Once | INTRAMUSCULAR | Status: DC | PRN

## 2018-04-13 MED ORDER — HEPARIN (PORCINE) IN NACL 1000-0.9 UT/500ML-% IV SOLN
INTRAVENOUS | Status: AC
  Filled 2018-04-13: qty 1000

## 2018-04-13 MED ORDER — METHYLPREDNISOLONE SODIUM SUCC 125 MG IJ SOLR: 125.0000 mg | INTRAMUSCULAR | Status: DC | PRN

## 2018-04-13 MED ORDER — MIDAZOLAM HCL 2 MG/2ML IJ SOLN
INTRAMUSCULAR | Status: DC | PRN
  Administered 2018-04-13: 2 mg via INTRAVENOUS

## 2018-04-13 MED ORDER — SODIUM CHLORIDE 0.9 % IV SOLN
INTRAVENOUS | Status: DC
  Administered 2018-04-13: 1000 mL via INTRAVENOUS

## 2018-04-13 MED ORDER — FAMOTIDINE 20 MG PO TABS: 40.0000 mg | ORAL_TABLET | ORAL | Status: DC | PRN

## 2018-04-13 SURGICAL SUPPLY — 8 items
CATH BEACON 5 .035 65 KMP TIP (CATHETERS) ×3 IMPLANT
DEVICE TORQUE .025-.038 (MISCELLANEOUS) ×3 IMPLANT
GLIDECATH NONTAPER ANGL 5FR (CATHETERS) ×3 IMPLANT
GUIDEWIRE ANGLED .035 180CM (WIRE) ×3 IMPLANT
NEEDLE ENTRY 21GA 7CM ECHOTIP (NEEDLE) ×3 IMPLANT
PACK ANGIOGRAPHY (CUSTOM PROCEDURE TRAY) ×3 IMPLANT
SET INTRO CAPELLA COAXIAL (SET/KITS/TRAYS/PACK) ×3 IMPLANT
SUT MNCRL AB 4-0 PS2 18 (SUTURE) ×3 IMPLANT

## 2018-04-13 NOTE — Op Note (Addendum)
OPERATIVE NOTE   PROCEDURE: 1. Ultrasound-guided access to the right brachial vein 2. Introduction catheter into superior vena cava right arm approach 3. Contrast injection left brachialcephalic fistula  PRE-OPERATIVE DIAGNOSIS: Complication of dialysis access                                                       End Stage Renal Disease  POST-OPERATIVE DIAGNOSIS: same as above   SURGEON: Katha Cabal, M.D.  ANESTHESIA: Conscious sedation was administered under my direct supervision by the interventional radiology RN.  IV Versed plus fentanyl were utilized. Continuous ECG, pulse oximetry and blood pressure was monitored throughout the entire procedure.  Conscious sedation was for a total of 32 minutes.  ESTIMATED BLOOD LOSS: minimal  FINDING(S): 1. Aneurysmal left arm fistula which is very tortuous however there is no stenosis or obstruction.  Central veins are patent.  On the right the innominate demonstrates a stenosis of approximately 80 to 85% brachial veins are patent there appears to be moderate stenosis of the axillary vein on the right  SPECIMEN(S):  None  CONTRAST: 35 cc  FLUOROSCOPY TIME: 1.2 minutes  INDICATIONS: Jeremy Rose is a 62 y.o. male who  presents with malfunctioning left AV access.  The patient is scheduled for angiography with possible intervention of the AV access to prevent loss of the permanent access.  The patient is aware the risks include but are not limited to: bleeding, infection, thrombosis of the cannulated access, and possible anaphylactic reaction to the contrast.  The patient acknowledges if the access can not be salvaged a tunneled catheter will be needed and will be placed during this procedure.  The patient is aware of the risks of the procedure and elects to proceed with the angiogram and intervention.  DESCRIPTION: After full informed written consent was obtained, the patient was brought back to the Special Procedure suite and placed  supine position.  Appropriate cardiopulmonary monitors were placed.  The right and left arms were prepped and draped in the standard fashion.  Appropriate timeout is called.  Ultrasound is placed in sterile sleeve ultrasound is utilized secondary lack of appropriate landmarks and to avoid vascular injury.  Under direct visualization the brachial veins are identified they are echolucent and compressible indicating patency.  Images recorded for the permanent record.  Under direct visualization after 1% lidocaine is been infiltrated into the soft tissues a micro needle was inserted into the lateral brachial vein microwire followed by micro-sheath.  Hand-injection contrast is then utilized to demonstrate the venous anatomy of the upper arm.  Axillary and subclavian veins are also imaged.  Innominate is poorly visualized and therefore a Glidewire was advanced through the micro sheath and a KMP catheter advanced first into the proximal subclavian and then all the way into the superior vena cava.  This better delineates the 80 to 85% stenosis of the right innominate vein.  The sheath is then pulled and pressure held.  Attention was then turned to the left arm.  The left brachiocephalic access was cannulated with a micropuncture needle under ultrasound guidence.  Ultrasound was used to evaluate the brachiocephalic access.  It was echolucent and compressible indicating it is patent .  An ultrasound image was acquired for the permanent record.  A micropuncture needle was used to access the left brachiocephalic access  under direct ultrasound guidance.  The microwire was then advanced under fluoroscopic guidance without difficulty followed by the micro-sheath.  The J-wire was then advanced and a 6 Fr sheath inserted.  Hand injections were completed to image the access from the arterial anastomosis through the entire access.  Glidewire was then introduced and a Kumpe catheter advanced first into the subclavian and then the  SVC.  The central venous structures were also imaged by hand injections.  Based on the images, the patient has patent superficial veins on the right I suspect that the moderate stenosis of the axillary is because he has such a large basilic vein that the contrast was washing out very rapidly and that this is not necessarily a true stenosis.  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  Creation of a right arm access would require some hybrid form of a fistula in association with a hero graft or central venous stenting is also possible and then creation of a fistula.  On the left the fistula is very large throughout it is very tortuous however there are no hemodynamically significant stenoses or lesions.  Given the options available I will continue to use his left arm fistula.  A 4-0 Monocryl purse-string suture was sewn around the sheath.  The sheath was removed and light pressure was applied.  A sterile bandage was applied to the puncture site.   SUMMARY:  Based on the images, the patient has patent superficial veins on the right I suspect that the moderate stenosis of the axillary is because he has such a large basilic vein that the contrast was washing out very rapidly and that this is not necessarily a true stenosis.  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  Creation of a right arm access would require some hybrid form of a fistula in association with a hero graft or central venous stenting is also possible and then creation of a fistula.  On the left the fistula is very large throughout it is very tortuous however there are no hemodynamically significant stenoses or lesions.  Given the options available I will continue to use his left arm fistula..    COMPLICATIONS: None  CONDITION: Jeremy Rose, M.D [facility] Office: [phone]  04/13/2018 11:01 AM

## 2018-04-13 NOTE — Discharge Instructions (Signed)
Venogram, Care After °This sheet gives you information about how to care for yourself after your procedure. Your health care provider may also give you more specific instructions. If you have problems or questions, contact your health care provider. °What can I expect after the procedure? °After the procedure, it is common to have: °· Bruising or mild discomfort in the area where the IV was inserted (insertion site). ° °Follow these instructions at home: °Eating and drinking °· Follow instructions from your health care provider about eating or drinking restrictions. °· Drink a lot of fluids for the first several days after the procedure, as directed by your health care provider. This helps to wash (flush) the contrast out of your body. Examples of healthy fluids include water or low-calorie drinks. °General instructions °· Check your IV insertion area every day for signs of infection. Check for: °? Redness, swelling, or pain. °? Fluid or blood. °? Warmth. °? Pus or a bad smell. °· Take over-the-counter and prescription medicines only as told by your health care provider. °· Rest and return to your normal activities as told by your health care provider. Ask your health care provider what activities are safe for you. °· Do not drive for 24 hours if you were given a medicine to help you relax (sedative), or until your health care provider approves. °· Keep all follow-up visits as told by your health care provider. This is important. °Contact a health care provider if: °· Your skin becomes itchy or you develop a rash or hives. °· You have a fever that does not get better with medicine. °· You feel nauseous. °· You vomit. °· You have redness, swelling, or pain around the insertion site. °· You have fluid or blood coming from the insertion site. °· Your insertion area feels warm to the touch. °· You have pus or a bad smell coming from the insertion site. °Get help right away if: °· You have difficulty breathing or  shortness of breath. °· You develop chest pain. °· You faint. °· You feel very dizzy. °These symptoms may represent a serious problem that is an emergency. Do not wait to see if the symptoms will go away. Get medical help right away. Call your local emergency services (911 in the [place] Do not drive yourself to the hospital. °Summary °· After your procedure, it is common to have bruising or mild discomfort in the area where the IV was inserted. °· You should check your IV insertion area every day for signs of infection. °· Take over-the-counter and prescription medicines only as told by your health care provider. °· You should drink a lot of fluids for the first several days after the procedure to help flush the contrast from your body. °This information is not intended to replace advice given to you by your health care provider. Make sure you discuss any questions you have with your health care provider. °Document Released: 08/24/2013 Document Revised: 09/27/2016 Document Reviewed: 09/27/2016 °Elsevier Interactive Patient Education © 2017 [employer] ° °

## 2018-04-13 NOTE — H&P (Signed)
[facility] History & Physical Update  The patient was interviewed and re-examined.  The patient's previous History and Physical has been reviewed and is unchanged.  There is no change in the plan of care. We plan to proceed with the scheduled procedure.  Hortencia Pilar, MD  04/13/2018, 10:09 AM

## 2018-04-15 ENCOUNTER — Ambulatory Visit (INDEPENDENT_AMBULATORY_CARE_PROVIDER_SITE_OTHER): Payer: Medicare PPO | Admitting: Podiatry

## 2018-04-15 ENCOUNTER — Encounter: Payer: Self-pay | Admitting: Podiatry

## 2018-04-15 DIAGNOSIS — M79676 Pain in unspecified toe(s): Secondary | ICD-10-CM | POA: Diagnosis not present

## 2018-04-15 DIAGNOSIS — L84 Corns and callosities: Secondary | ICD-10-CM | POA: Diagnosis not present

## 2018-04-15 DIAGNOSIS — M79609 Pain in unspecified limb: Principal | ICD-10-CM

## 2018-04-15 DIAGNOSIS — B351 Tinea unguium: Secondary | ICD-10-CM | POA: Diagnosis not present

## 2018-04-15 NOTE — Progress Notes (Signed)
This patient presents the office with chief complaint of elongated, thickened toenails.  Patient says the nails are painful walking and wearing his shoes.  He says he is unable to self treat.  Painful callus left foot during walking.   He presents the office today for preventative foot care services.  General Appearance  Alert, conversant and in no acute stress.  Vascular  Dorsalis pedis and posterior pulses are palpable  bilaterally.  Capillary return is within normal limits  bilaterally. Temperature is within normal limits  Bilaterally.  Neurologic  Senn-Weinstein monofilament wire test within normal limits  bilaterally. Muscle power within normal limits bilaterally.  Nails Thick disfigured discolored nails with subungual debris bilaterally from hallux to fifth toes bilaterally. No evidence of bacterial infection or drainage bilaterally.  Orthopedic  No limitations of motion of motion feet bilaterally.  No crepitus or effusions noted.  HAV  B/L.  Skin  normotropic skin with no porokeratosis noted bilaterally.  No signs of infections or ulcers noted.  Callus sub 1,5  B/L.  Pinch callus hallux  B/L  Onychomycosis  B/L  Callus  B/L.  Debridement of nails  B/L.   Debridement of callus  Left foot.Marland Kitchen RTC 3 months    Gardiner Barefoot DPM

## 2018-04-29 ENCOUNTER — Ambulatory Visit (INDEPENDENT_AMBULATORY_CARE_PROVIDER_SITE_OTHER): Payer: Medicare PPO | Admitting: Vascular Surgery

## 2018-04-29 ENCOUNTER — Encounter (INDEPENDENT_AMBULATORY_CARE_PROVIDER_SITE_OTHER): Payer: Self-pay | Admitting: Vascular Surgery

## 2018-04-29 VITALS — BP 126/72 | HR 64 | Resp 13 | Ht 68.0 in | Wt 163.0 lb

## 2018-04-29 DIAGNOSIS — I25119 Atherosclerotic heart disease of native coronary artery with unspecified angina pectoris: Secondary | ICD-10-CM

## 2018-04-29 DIAGNOSIS — Z992 Dependence on renal dialysis: Secondary | ICD-10-CM | POA: Diagnosis not present

## 2018-04-29 DIAGNOSIS — N186 End stage renal disease: Secondary | ICD-10-CM | POA: Diagnosis not present

## 2018-04-29 DIAGNOSIS — I10 Essential (primary) hypertension: Secondary | ICD-10-CM | POA: Diagnosis not present

## 2018-04-29 DIAGNOSIS — M79602 Pain in left arm: Secondary | ICD-10-CM | POA: Diagnosis not present

## 2018-04-29 DIAGNOSIS — M79603 Pain in arm, unspecified: Secondary | ICD-10-CM | POA: Insufficient documentation

## 2018-04-29 DIAGNOSIS — T829XXS Unspecified complication of cardiac and vascular prosthetic device, implant and graft, sequela: Secondary | ICD-10-CM

## 2018-04-29 NOTE — Progress Notes (Signed)
MRN : [id number]  Jeremy Rose is a 62 y.o. (Aug 31, 1956) male who presents with chief complaint of  Chief Complaint  Patient presents with  . Follow-up    2 week [facility] follow up  .  History of Present Illness:  The patient returns to the office for follow up regarding problem with the dialysis access. Currently the patient is maintained via a left brachial cephalic fistula.  The patient has had multiple failed upper extremity accesses.  PROCEDURE: 1. Ultrasound-guided access to the right brachial vein 2. Introduction catheter into superior vena cava right arm approach 3. 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  The patient is having worsening hand pain.  The patient denies redness or swelling at the access site. The patient denies fever or chills at home or while on dialysis.  The patient denies amaurosis fugax or recent TIA symptoms. There are no recent neurological changes noted. The patient denies claudication symptoms or rest pain symptoms. The patient denies history of DVT, PE or superficial thrombophlebitis. The patient denies recent episodes of angina or shortness of breath.           No outpatient medications have been marked as taking for the 04/29/18 encounter (Office Visit) with Delana Meyer, Dolores Lory, MD.    Past Medical History:  Diagnosis Date  . Arthritis    hands  . Coronary artery disease   . Dialysis patient ([place])    T, TH, S  . GERD (gastroesophageal reflux disease)   . Gout of right hand   . Hypertension   . Renal disorder   . Seizures ([place])     Past Surgical History:  Procedure Laterality Date  . A/V FISTULAGRAM Left 02/24/2017   Procedure: A/V Fistulagram;  Surgeon: Katha Cabal, MD;  Location: [facility];  Service: Cardiovascular;  Laterality: Left;  . A/V SHUNT INTERVENTION N/A 02/24/2017   Procedure: A/V Shunt Intervention;  Surgeon: Katha Cabal, MD;  Location: [facility];  Service: Cardiovascular;  Laterality: N/A;  . AV FISTULA PLACEMENT Left   . AV FISTULA PLACEMENT Left 12/02/2017   Procedure: ARTERIOVENOUS (AV) FISTULA CREATION ( BRACHIAL CEPHALIC );  Surgeon: Katha Cabal, MD;  Location: [facility];  Service: Vascular;  Laterality: Left;  . CARDIAC CATHETERIZATION    . LIGATION OF ARTERIOVENOUS  FISTULA Left 12/02/2017   Procedure: LIGATION OF ARTERIOVENOUS  FISTULA ( REMOVAL LEFT WRIST FISTULA );  Surgeon: Katha Cabal, MD;  Location: [facility];  Service: Vascular;  Laterality: Left;  . PERIPHERAL VASCULAR CATHETERIZATION Left 06/03/2016   Procedure: A/V Shuntogram/Fistulagram;  Surgeon: Katha Cabal, MD;  Location: [place] [facility];  Service: Cardiovascular;  Laterality: Left;  . PERIPHERAL VASCULAR CATHETERIZATION  06/03/2016   Procedure: Peripheral Vascular Balloon Angioplasty;  Surgeon: Katha Cabal, MD;  Location: [place] [facility];  Service: Cardiovascular;;  . UPPER EXTREMITY VENOGRAPHY N/A 04/13/2018   Procedure: UPPER EXTREMITY VENOGRAPHY;  Surgeon: Katha Cabal, MD;  Location: [place] [facility];  Service: Cardiovascular;  Laterality: N/A;    Social History Social History   Tobacco Use  . Smoking status: Former Research scientist (life sciences)  . Smokeless tobacco: Former Network engineer Use Topics  . Alcohol use: No  . Drug use: No    Family History Family History  Problem Relation Age of Onset  . Heart disease Mother   . Kidney disease Paternal Uncle     Allergies  Allergen Reactions  . Codeine Nausea And Vomiting  . Doxycycline Nausea And Vomiting     REVIEW OF SYSTEMS (Negative unless checked)  Constitutional: [] Weight loss  [] Fever  [] Chills Cardiac: [] Chest pain   [] Chest pressure   [] Palpitations   [] Shortness of  breath when laying flat   [] Shortness of breath with exertion. Vascular:  [] Pain in legs with walking   [] Pain in legs at rest  [] History of DVT   [] Phlebitis   [] Swelling in legs   [] Varicose veins   [] Non-healing ulcers Pulmonary:   [] Uses home oxygen   [] Productive cough   [] Hemoptysis   [] Wheeze  [] COPD   [] Asthma Neurologic:  [] Dizziness   [] Seizures   [] History of stroke   [] History of TIA  [] Aphasia   [] Vissual changes   [] Weakness or numbness in arm   [] Weakness or numbness in leg Musculoskeletal:   [] Joint swelling   [x] Joint pain   [] Low back pain Hematologic:  [] Easy bruising  [] Easy bleeding   [] Hypercoagulable state   [] Anemic Gastrointestinal:  [] Diarrhea   [] Vomiting  [] Gastroesophageal reflux/heartburn   [] Difficulty swallowing. Genitourinary:  [x] Chronic kidney disease   [] Difficult urination  [] Frequent urination   [] Blood in urine Skin:  [] Rashes   [] Ulcers  Psychological:  [] History of anxiety   []  History of major depression.  Physical Examination  Vitals:   04/29/18 1000  BP: 126/72  Pulse: 64  Resp: 13  Weight: 163 lb (73.9 kg)  Height: 5\' 8"  (1.727 m)   Body mass index is 24.78 kg/m. Gen: WD/WN, NAD Head: [place]/AT, No temporalis wasting.  Ear/Nose/Throat: Hearing grossly intact, nares w/o erythema or drainage Eyes: PER, EOMI, sclera nonicteric.  Neck: Supple, no large masses.   Pulmonary:  Good air movement, no audible wheezing bilaterally, no use of accessory muscles.  Cardiac: RRR, no JVD Vascular: Left brachiocephalic fistula moderately tortuous and markedly aneurysmal good thrill good bruit Vessel Right Left  Radial Palpable Palpable  Ulnar Palpable Palpable  Brachial Palpable Palpable  Gastrointestinal: Non-distended. No guarding/no peritoneal signs.  Musculoskeletal: M/S 5/5 throughout.  No deformity or atrophy.  Neurologic: CN 2-12 intact. Symmetrical.  Speech is fluent. Motor exam as listed above. Psychiatric: Judgment intact, Mood & affect  appropriate for pt's clinical situation. Dermatologic: No rashes or ulcers noted.  No changes consistent with cellulitis. Lymph : No lichenification or skin changes of chronic lymphedema.  CBC Lab Results  Component Value Date   WBC 5.1 11/24/2017   HGB 12.2 (L) 12/02/2017   HCT 36.0 (L) 12/02/2017   MCV 96.1 11/24/2017   PLT 194 11/24/2017    BMET    Component Value Date/Time   NA 140 12/02/2017 1010   NA 138 05/18/2014 2142   K 4.7 12/02/2017 1010   K 5.0 06/28/2014 1254   CL 92 (L) 11/24/2017 1310   CL 98 05/18/2014 2142   CO2 34 (H) 11/24/2017 1310   CO2 33 (H) 05/18/2014 2142   GLUCOSE 95 12/02/2017 1010   GLUCOSE 88 05/18/2014 2142   BUN 28 (H) 11/24/2017 1310   BUN 12 05/18/2014 2142   CREATININE 8.16 (H) 11/24/2017 1310   CREATININE 5.73 (H) 05/18/2014 2142   CALCIUM 9.2 11/24/2017 1310   CALCIUM 8.3 (L) 05/18/2014 2142   GFRNONAA 6 (L) 11/24/2017 1310   GFRNONAA 10 (L) 05/18/2014 2142   GFRAA 7 (L) 11/24/2017 1310  GFRAA 12 (L) 05/18/2014 2142   CrCl cannot be calculated (Patient's most recent lab result is older than the maximum 21 days allowed.).  COAG Lab Results  Component Value Date   INR 0.94 11/24/2017    Radiology No results found.  Assessment/Plan 1. Pain of left upper extremity The patient's complaint of hand pain is atypical for steal and he has a 2+ palpable radial pulse.  I am considering the possibility this is radicular pain from degenerative cervical disc disease.  I will order plain films to assess the degree of degenerative changes of the neck.  We will see him back to review the films approximately 2 weeks. - DG Cervical Spine 2 or 3 views; Future  2. Complication of arteriovenous dialysis fistula, sequela No immediate intervention at this time.  I am not convinced that his hand pain is secondary to his fistula although this does remain a possibility  3. ESRD (end stage renal disease) on dialysis ([place]) Continue hemodialysis via  the left arm AV fistula without interruption  4. Coronary artery disease involving native coronary artery of native heart with angina pectoris ([place]) Continue cardiac and antihypertensive medications as already ordered and reviewed, no changes at this time.  Continue statin as ordered and reviewed, no changes at this time  Nitrates PRN for chest pain   5. Essential hypertension Continue antihypertensive medications as already ordered, these medications have been reviewed and there are no changes at this time.    Hortencia Pilar, MD  04/29/2018 10:28 AM

## 2018-04-30 ENCOUNTER — Encounter (INDEPENDENT_AMBULATORY_CARE_PROVIDER_SITE_OTHER): Payer: Self-pay | Admitting: Vascular Surgery

## 2018-05-13 ENCOUNTER — Ambulatory Visit (INDEPENDENT_AMBULATORY_CARE_PROVIDER_SITE_OTHER): Payer: Medicare PPO | Admitting: Vascular Surgery

## 2018-05-13 ENCOUNTER — Encounter (INDEPENDENT_AMBULATORY_CARE_PROVIDER_SITE_OTHER): Payer: Self-pay

## 2018-05-13 ENCOUNTER — Encounter (INDEPENDENT_AMBULATORY_CARE_PROVIDER_SITE_OTHER): Payer: Self-pay | Admitting: Vascular Surgery

## 2018-05-13 VITALS — BP 100/62 | HR 91 | Resp 16 | Ht 68.0 in | Wt 160.6 lb

## 2018-05-13 DIAGNOSIS — M79602 Pain in left arm: Secondary | ICD-10-CM | POA: Diagnosis not present

## 2018-05-13 DIAGNOSIS — I10 Essential (primary) hypertension: Secondary | ICD-10-CM

## 2018-05-13 DIAGNOSIS — I25119 Atherosclerotic heart disease of native coronary artery with unspecified angina pectoris: Secondary | ICD-10-CM

## 2018-05-13 DIAGNOSIS — Z992 Dependence on renal dialysis: Secondary | ICD-10-CM

## 2018-05-13 DIAGNOSIS — T829XXS Unspecified complication of cardiac and vascular prosthetic device, implant and graft, sequela: Secondary | ICD-10-CM

## 2018-05-13 DIAGNOSIS — N186 End stage renal disease: Secondary | ICD-10-CM

## 2018-05-13 NOTE — Progress Notes (Signed)
MRN : [id number]  Jeremy Rose is a 62 y.o. ([birth year]) male who presents with chief complaint of  Chief Complaint  Patient presents with  . Follow-up    2week follow up  .  History of Present Illness: The patient returns to the office for followup status angiogram of the dialysis access 04/13/2018. The patient continues to be experiencing increased difficulty with access and bleeding following decannulation and increased recirculation with diminished efficiency of their dialysis. The patient denies an increase in arm swelling. At the present time the patient denies hand pain.  The patient denies amaurosis fugax or recent TIA symptoms. There are no recent neurological changes noted. The patient denies claudication symptoms or rest pain symptoms. The patient denies history of DVT, PE or superficial thrombophlebitis. The patient denies recent episodes of angina or shortness of breath.    Current Meds  Medication Sig  . albuterol (PROVENTIL HFA;VENTOLIN HFA) 108 (90 BASE) MCG/ACT inhaler Inhale into the lungs every 6 (six) hours as needed for wheezing or shortness of breath.  . allopurinol (ZYLOPRIM) 100 MG tablet Take 200 mg by mouth daily.  Marland Kitchen aspirin EC 81 MG tablet Take 81 mg by mouth daily.  . beclomethasone (QVAR) 80 MCG/ACT inhaler Inhale 1 puff into the lungs as needed (for shortness of breath).  . cinacalcet (SENSIPAR) 60 MG tablet Take 120 mg by mouth daily.   . cyanocobalamin 1000 MCG tablet Take 1,000 mcg daily by mouth.  . felodipine (PLENDIL) 2.5 MG 24 hr tablet Take 2.5 mg by mouth daily.   Marland Kitchen ibuprofen (ADVIL,MOTRIN) 200 MG tablet Take 200 mg by mouth daily as needed for headache or moderate pain.  . Lacosamide (VIMPAT) 150 MG TABS Take 150 mg by mouth 2 (two) times daily. Take an 3rd dose after dialysis on M-W-F  . levETIRAcetam (KEPPRA) 500 MG tablet Take 500 mg by mouth 2 (two) times daily. Take a 3rd dose of 500 mgs after dialysis only on dialysis days (M-W-F)    . lidocaine-prilocaine (EMLA) cream Apply 1 application topically daily as needed (port access).   Marland Kitchen omeprazole (PRILOSEC) 20 MG capsule Take 20 mg by mouth daily.  . sevelamer carbonate (RENVELA) 800 MG tablet Take 800-2,400 mg by mouth See admin instructions. Take 2400 mg with each meal and 800 mg with each snack  . [DISCONTINUED] carvedilol (COREG) 25 MG tablet Take 10 mg by mouth 2 (two) times daily.   . [DISCONTINUED] folic acid (FOLVITE) 496 MCG tablet Take 400 mcg by mouth daily.    Past Medical History:  Diagnosis Date  . Arthritis    hands  . Coronary artery disease   . Dialysis patient ([place])    T, TH, S  . GERD (gastroesophageal reflux disease)   . Gout of right hand   . Hypertension   . Renal disorder   . Seizures ([place])     Past Surgical History:  Procedure Laterality Date  . A/V FISTULAGRAM Left 02/24/2017   Procedure: A/V Fistulagram;  Surgeon: Katha Cabal, MD;  Location: [place];  Service: Cardiovascular;  Laterality: Left;  . A/V SHUNT INTERVENTION N/A 02/24/2017   Procedure: A/V Shunt Intervention;  Surgeon: Katha Cabal, MD;  Location: [facility];  Service: Cardiovascular;  Laterality: N/A;  . AV FISTULA PLACEMENT Left   . AV FISTULA PLACEMENT Left 12/02/2017   Procedure: ARTERIOVENOUS (AV) FISTULA CREATION ( BRACHIAL CEPHALIC );  Surgeon: Katha Cabal, MD;  Location: [facility];  Service: Vascular;  Laterality: Left;  . CARDIAC CATHETERIZATION    . LIGATION OF ARTERIOVENOUS  FISTULA Left 12/02/2017   Procedure: LIGATION OF ARTERIOVENOUS  FISTULA ( REMOVAL LEFT WRIST FISTULA );  Surgeon: Katha Cabal, MD;  Location: [facility];  Service: Vascular;  Laterality: Left;  . PERIPHERAL VASCULAR CATHETERIZATION Left 06/03/2016   Procedure: A/V Shuntogram/Fistulagram;  Surgeon: Katha Cabal, MD;  Location: [place] [facility];  Service: Cardiovascular;  Laterality: Left;  . PERIPHERAL VASCULAR CATHETERIZATION  06/03/2016    Procedure: Peripheral Vascular Balloon Angioplasty;  Surgeon: Katha Cabal, MD;  Location: [place] [facility];  Service: Cardiovascular;;  . UPPER EXTREMITY VENOGRAPHY N/A 04/13/2018   Procedure: UPPER EXTREMITY VENOGRAPHY;  Surgeon: Katha Cabal, MD;  Location: [place] [facility] [facility];  Service: Cardiovascular;  Laterality: N/A;    Social History Social History   Tobacco Use  . Smoking status: Former Research scientist (life sciences)  . Smokeless tobacco: Former Network engineer Use Topics  . Alcohol use: No  . Drug use: No    Family History Family History  Problem Relation Age of Onset  . Heart disease Mother   . Kidney disease Paternal Uncle     Allergies  Allergen Reactions  . Codeine Nausea And Vomiting  . Doxycycline Nausea And Vomiting     REVIEW OF SYSTEMS (Negative unless checked)  Constitutional: [] Weight loss  [] Fever  [] Chills Cardiac: [] Chest pain   [] Chest pressure   [] Palpitations   [] Shortness of breath when laying flat   [] Shortness of breath with exertion. Vascular:  [] Pain in legs with walking   [] Pain in legs at rest  [] History of DVT   [] Phlebitis   [] Swelling in legs   [] Varicose veins   [] Non-healing ulcers Pulmonary:   [] Uses home oxygen   [] Productive cough   [] Hemoptysis   [] Wheeze  [] COPD   [] Asthma Neurologic:  [] Dizziness   [] Seizures   [] History of stroke   [] History of TIA  [] Aphasia   [] Vissual changes   [] Weakness or numbness in arm   [] Weakness or numbness in leg Musculoskeletal:   [] Joint swelling   [] Joint pain   [] Low back pain Hematologic:  [] Easy bruising  [] Easy bleeding   [] Hypercoagulable state   [] Anemic Gastrointestinal:  [] Diarrhea   [] Vomiting  [] Gastroesophageal reflux/heartburn   [] Difficulty swallowing. Genitourinary:  [x] Chronic kidney disease   [] Difficult urination  [] Frequent urination   [] Blood in urine Skin:  [] Rashes   [] Ulcers  Psychological:  [] History of anxiety   []  History of major depression.  Physical Examination  Vitals:    05/13/18 1317  BP: 100/62  Pulse: 91  Resp: 16  Weight: 160 lb 9.6 oz (72.8 kg)  Height: 5\' 8"  (1.727 m)   Body mass index is 24.42 kg/m. Gen: WD/WN, NAD Head: [place]/AT, No temporalis wasting.  Ear/Nose/Throat: Hearing grossly intact, nares w/o erythema or drainage Eyes: PER, EOMI, sclera nonicteric.  Neck: Supple, no large masses.   Pulmonary:  Good air movement, no audible wheezing bilaterally, no use of accessory muscles.  Cardiac: RRR, no JVD Vascular: left arm fistula very aneurysmal and very tortuous  Vessel Right Left  Radial Palpable Palpable  Ulnar Palpable Palpable  Brachial Palpable Palpable  Gastrointestinal: Non-distended. No guarding/no peritoneal signs.  Musculoskeletal: M/S 5/5 throughout.  No deformity or atrophy.  Neurologic: CN 2-12 intact. Symmetrical.  Speech is fluent. Motor exam as listed above. Psychiatric: Judgment intact, Mood & affect appropriate for pt's clinical situation. Dermatologic: No rashes or ulcers noted.  No changes consistent with cellulitis. Lymph : No lichenification or skin changes of chronic lymphedema.  CBC Lab Results  Component Value Date   WBC 5.1 11/24/2017   HGB 12.2 (L) 12/02/2017   HCT 36.0 (L) 12/02/2017   MCV 96.1 11/24/2017   PLT 194 11/24/2017    BMET    Component Value Date/Time   NA 140 12/02/2017 1010   NA 138 05/18/2014 2142   K 4.7 12/02/2017 1010   K 5.0 06/28/2014 1254   CL 92 (L) 11/24/2017 1310   CL 98 05/18/2014 2142   CO2 34 (H) 11/24/2017 1310   CO2 33 (H) 05/18/2014 2142   GLUCOSE 95 12/02/2017 1010   GLUCOSE 88 05/18/2014 2142   BUN 28 (H) 11/24/2017 1310   BUN 12 05/18/2014 2142   CREATININE 8.16 (H) 11/24/2017 1310   CREATININE 5.73 (H) 05/18/2014 2142   CALCIUM 9.2 11/24/2017 1310   CALCIUM 8.3 (L) 05/18/2014 2142   GFRNONAA 6 (L) 11/24/2017 1310   GFRNONAA 10 (L) 05/18/2014 2142   GFRAA 7 (L) 11/24/2017 1310   GFRAA 12 (L) 05/18/2014 2142   CrCl cannot be calculated (Patient's most  recent lab result is older than the maximum 21 days allowed.).  COAG Lab Results  Component Value Date   INR 0.94 11/24/2017    Radiology No results found.    Assessment/Plan 1. Complication of arteriovenous dialysis fistula, sequela Recommend:  At this time the patient does not have appropriate extremity access for dialysis as he is having pain and poor dialysis  Patient should have a revised access created with resection of the aneurysmal fistula created.  The risks, benefits and alternative therapies were reviewed in detail with the patient.  All questions were answered.  The patient agrees to proceed with surgery.    2. Pain of left upper extremity See #1  3. ESRD (end stage renal disease) on dialysis ([place]) Continue HD without interruption tunneled catheter will be needed at the time of revision  4. Essential hypertension Continue antihypertensive medications as already ordered, these medications have been reviewed and there are no changes at this time.   5. Coronary artery disease involving native coronary artery of native heart with angina pectoris ([place]) Continue cardiac and antihypertensive medications as already ordered and reviewed, no changes at this time.  Continue statin as ordered and reviewed, no changes at this time  Nitrates PRN for chest pain    Hortencia Pilar, MD  05/13/2018 9:09 PM

## 2018-05-17 ENCOUNTER — Other Ambulatory Visit (INDEPENDENT_AMBULATORY_CARE_PROVIDER_SITE_OTHER): Payer: Self-pay | Admitting: Vascular Surgery

## 2018-05-18 ENCOUNTER — Encounter
Admission: RE | Admit: 2018-05-18 | Discharge: 2018-05-18 | Disposition: A | Discharge status: Home / Self Care | Payer: Medicare PPO | Source: Ambulatory Visit | Attending: Vascular Surgery | Admitting: Vascular Surgery

## 2018-05-18 ENCOUNTER — Other Ambulatory Visit: Payer: Self-pay

## 2018-05-18 DIAGNOSIS — Z01812 Encounter for preprocedural laboratory examination: Secondary | ICD-10-CM | POA: Insufficient documentation

## 2018-05-18 DIAGNOSIS — X58XXXA Exposure to other specified factors, initial encounter: Secondary | ICD-10-CM | POA: Diagnosis not present

## 2018-05-18 DIAGNOSIS — T82590A Other mechanical complication of surgically created arteriovenous fistula, initial encounter: Secondary | ICD-10-CM | POA: Insufficient documentation

## 2018-05-18 DIAGNOSIS — Z01810 Encounter for preprocedural cardiovascular examination: Secondary | ICD-10-CM | POA: Insufficient documentation

## 2018-05-18 DIAGNOSIS — I120 Hypertensive chronic kidney disease with stage 5 chronic kidney disease or end stage renal disease: Secondary | ICD-10-CM | POA: Diagnosis not present

## 2018-05-18 DIAGNOSIS — I2510 Atherosclerotic heart disease of native coronary artery without angina pectoris: Secondary | ICD-10-CM | POA: Insufficient documentation

## 2018-05-18 DIAGNOSIS — Z87891 Personal history of nicotine dependence: Secondary | ICD-10-CM | POA: Insufficient documentation

## 2018-05-18 DIAGNOSIS — Z959 Presence of cardiac and vascular implant and graft, unspecified: Secondary | ICD-10-CM | POA: Insufficient documentation

## 2018-05-18 DIAGNOSIS — Z8249 Family history of ischemic heart disease and other diseases of the circulatory system: Secondary | ICD-10-CM | POA: Diagnosis not present

## 2018-05-18 DIAGNOSIS — N186 End stage renal disease: Secondary | ICD-10-CM | POA: Diagnosis not present

## 2018-05-18 DIAGNOSIS — Z9889 Other specified postprocedural states: Secondary | ICD-10-CM | POA: Insufficient documentation

## 2018-05-18 HISTORY — DX: Unspecified asthma, uncomplicated: J45.909

## 2018-05-18 LAB — CBC WITH DIFFERENTIAL/PLATELET
Basophils Absolute: 0.1 10*3/uL (ref 0–0.1)
Basophils Relative: 1 %
Eosinophils Absolute: 0.1 10*3/uL (ref 0–0.7)
Eosinophils Relative: 1 %
HEMATOCRIT: 36.6 % — AB (ref 40.0–52.0)
Hemoglobin: 12 g/dL — ABNORMAL LOW (ref 13.0–18.0)
LYMPHS PCT: 16 %
Lymphs Abs: 1 10*3/uL (ref 1.0–3.6)
MCH: 31.7 pg (ref 26.0–34.0)
MCHC: 32.9 g/dL (ref 32.0–36.0)
MCV: 96.4 fL (ref 80.0–100.0)
MONOS PCT: 9 %
Monocytes Absolute: 0.6 10*3/uL (ref 0.2–1.0)
NEUTROS ABS: 4.9 10*3/uL (ref 1.4–6.5)
Neutrophils Relative %: 73 %
Platelets: 198 10*3/uL (ref 150–440)
RBC: 3.8 MIL/uL — ABNORMAL LOW (ref 4.40–5.90)
RDW: 16.8 % — AB (ref 11.5–14.5)
WBC: 6.7 10*3/uL (ref 3.8–10.6)

## 2018-05-18 LAB — BASIC METABOLIC PANEL
ANION GAP: 16 — AB (ref 5–15)
BUN: 18 mg/dL (ref 8–23)
CO2: 29 mmol/L (ref 22–32)
Calcium: 10.3 mg/dL (ref 8.9–10.3)
Chloride: 96 mmol/L — ABNORMAL LOW (ref 98–111)
Creatinine, Ser: 8.2 mg/dL — ABNORMAL HIGH (ref 0.61–1.24)
GFR calc Af Amer: 7 mL/min — ABNORMAL LOW (ref >60)
GFR calc non Af Amer: 6 mL/min — ABNORMAL LOW (ref >60)
Glucose, Bld: 100 mg/dL — ABNORMAL HIGH (ref 70–99)
POTASSIUM: 4.3 mmol/L (ref 3.5–5.1)
Sodium: 141 mmol/L (ref 135–145)

## 2018-05-18 LAB — TYPE AND SCREEN
ABO/RH(D): O NEG
Antibody Screen: NEGATIVE

## 2018-05-18 LAB — APTT: aPTT: 33 seconds (ref 24–36)

## 2018-05-18 LAB — PROTIME-INR
INR: 0.94
Prothrombin Time: 12.5 seconds (ref 11.4–15.2)

## 2018-05-27 NOTE — Patient Instructions (Signed)
Your procedure is scheduled on: Friday, May 28, 2018  Report to [facility].     DO NOT STOP ON THE FIRST FLOOR TO REGISTER  To find out your arrival time please call [phone] between 1PM - 3PM on Thursday, May 27, 2018  Remember: Instructions that are not followed completely may result in serious medical risk,  up to and including death, or upon the discretion of your surgeon and anesthesiologist your  surgery may need to be rescheduled.     _X__ 1. Do not eat food after midnight the night before your procedure.                 No gum chewing or hard candies. NOTHING SOLID IN YOUR MOUTH AFTER MIDNIGHT                  You may drink clear liquids up to 2 hours before you are scheduled to arrive for your surgery-                   DO not drink clear liquids within 2 hours of the start of your surgery.                  Clear Liquids include:  water, apple juice without pulp, clear carbohydrate                 drink such as Clearfast of Gatorade, Black Coffee or Tea (Do not add                 anything to coffee or tea).  __X__2.  On the morning of surgery brush your teeth with toothpaste and water,                  You may rinse your mouth with mouthwash if you wish.                  Do not swallow any toothpaste of mouthwash.     _X__ 3.  No Alcohol for 24 hours before or after surgery.   _X__ 4.  Do Not Smoke or use e-cigarettes For 24 Hours Prior to Your Surgery.                 Do not use any chewable tobacco products for at least 6 hours prior to                 surgery.  ____  5.  Bring all medications with you on the day of surgery if instructed.   ____  6.  Notify your doctor if there is any change in your medical condition      (cold, fever, infections).     Do not wear jewelry, make-up, hairpins, clips or nail polish. Do not wear lotions, powders, or perfumes. You may wear deodorant. Do not shave 48 hours prior to  surgery. Men may shave face and neck. Do not bring valuables to the hospital.    [facility] is not responsible for any belongings or valuables.  Contacts, dentures or bridgework may not be worn into surgery. Leave your suitcase in the car. After surgery it may be brought to your room. For patients admitted to the hospital, discharge time is determined by your treatment team.   Patients discharged the day of surgery will not be allowed to drive home.   Please read over the following fact sheets that you were given:  PREPARING FOR SURGERY   ____ Take these medicines the morning of surgery with A SIP OF WATER:    1. QVAR (BRING THIS AND YOUR ALBUTEROL INHALER WITH YOU TO [facility])  2. CARVEDILOL  3. FELODIPINE  4. PRILOSEC  5. VIMPAT  6. KEPPRA             7.  ALLOPURINOL  ____ Fleet Enema (as directed)   ____ Use CHG Soap as directed  _X___ Use inhalers on the day of surgery  __X__ DO NOT Stop ASPIRIN BUT DO NOT TAKE ON THE MORNING OF SURGERY  _X___ Stop Anti-inflammatories AS OF 05/18/2018   ____ Stop supplements until after surgery.    ____ Bring C-Pap to the hospital.

## 2018-05-28 ENCOUNTER — Ambulatory Visit: Payer: Medicare PPO

## 2018-05-28 ENCOUNTER — Ambulatory Visit: Payer: Medicare PPO | Admitting: Anesthesiology

## 2018-05-28 ENCOUNTER — Other Ambulatory Visit: Payer: Self-pay

## 2018-05-28 ENCOUNTER — Encounter: Payer: Self-pay | Admitting: *Deleted

## 2018-05-28 ENCOUNTER — Ambulatory Visit
Admission: RE | Admit: 2018-05-28 | Discharge: 2018-05-28 | Disposition: A | Discharge status: Home / Self Care | Payer: Medicare PPO | Source: Ambulatory Visit | Attending: Vascular Surgery | Admitting: Vascular Surgery

## 2018-05-28 ENCOUNTER — Encounter
Admission: RE | Disposition: A | Discharge status: Home / Self Care | Payer: Self-pay | Source: Ambulatory Visit | Attending: Vascular Surgery

## 2018-05-28 DIAGNOSIS — Y832 Surgical operation with anastomosis, bypass or graft as the cause of abnormal reaction of the patient, or of later complication, without mention of misadventure at the time of the procedure: Secondary | ICD-10-CM | POA: Diagnosis not present

## 2018-05-28 DIAGNOSIS — N186 End stage renal disease: Secondary | ICD-10-CM | POA: Diagnosis not present

## 2018-05-28 DIAGNOSIS — M19041 Primary osteoarthritis, right hand: Secondary | ICD-10-CM | POA: Diagnosis not present

## 2018-05-28 DIAGNOSIS — K219 Gastro-esophageal reflux disease without esophagitis: Secondary | ICD-10-CM | POA: Diagnosis not present

## 2018-05-28 DIAGNOSIS — R569 Unspecified convulsions: Secondary | ICD-10-CM | POA: Insufficient documentation

## 2018-05-28 DIAGNOSIS — I2510 Atherosclerotic heart disease of native coronary artery without angina pectoris: Secondary | ICD-10-CM | POA: Insufficient documentation

## 2018-05-28 DIAGNOSIS — T82898A Other specified complication of vascular prosthetic devices, implants and grafts, initial encounter: Secondary | ICD-10-CM | POA: Diagnosis present

## 2018-05-28 DIAGNOSIS — I728 Aneurysm of other specified arteries: Secondary | ICD-10-CM | POA: Insufficient documentation

## 2018-05-28 DIAGNOSIS — Z87891 Personal history of nicotine dependence: Secondary | ICD-10-CM | POA: Insufficient documentation

## 2018-05-28 DIAGNOSIS — I120 Hypertensive chronic kidney disease with stage 5 chronic kidney disease or end stage renal disease: Secondary | ICD-10-CM | POA: Diagnosis not present

## 2018-05-28 DIAGNOSIS — J45909 Unspecified asthma, uncomplicated: Secondary | ICD-10-CM | POA: Diagnosis not present

## 2018-05-28 DIAGNOSIS — M19042 Primary osteoarthritis, left hand: Secondary | ICD-10-CM | POA: Insufficient documentation

## 2018-05-28 DIAGNOSIS — M109 Gout, unspecified: Secondary | ICD-10-CM | POA: Insufficient documentation

## 2018-05-28 HISTORY — PX: INSERTION OF DIALYSIS CATHETER: SHX1324

## 2018-05-28 HISTORY — PX: REVISON OF ARTERIOVENOUS FISTULA: SHX6074

## 2018-05-28 LAB — POCT I-STAT 4, (NA,K, GLUC, HGB,HCT)
Glucose, Bld: 93 mg/dL (ref 70–99)
HCT: 35 % — ABNORMAL LOW (ref 39.0–52.0)
Hemoglobin: 11.9 g/dL — ABNORMAL LOW (ref 13.0–17.0)
POTASSIUM: 4.5 mmol/L (ref 3.5–5.1)
Sodium: 136 mmol/L (ref 135–145)

## 2018-05-28 SURGERY — REVISON OF ARTERIOVENOUS FISTULA
Anesthesia: General | Site: Chest | Laterality: Left | Wound class: "Clean "

## 2018-05-28 MED ORDER — CHLORHEXIDINE GLUCONATE CLOTH 2 % EX PADS: 6.0000 | MEDICATED_PAD | Freq: Once | CUTANEOUS | Status: DC

## 2018-05-28 MED ORDER — SODIUM CHLORIDE 0.9 % IJ SOLN
INTRAMUSCULAR | Status: AC
  Filled 2018-05-28: qty 20

## 2018-05-28 MED ORDER — HYDROMORPHONE HCL 1 MG/ML IJ SOLN
INTRAMUSCULAR | Status: DC | PRN
  Administered 2018-05-28: 0.5 mg via INTRAVENOUS

## 2018-05-28 MED ORDER — ONDANSETRON HCL 4 MG/2ML IJ SOLN: 4.0000 mg | Freq: Once | INTRAMUSCULAR | Status: DC | PRN

## 2018-05-28 MED ORDER — SODIUM CHLORIDE 0.9 % IJ SOLN
INTRAMUSCULAR | Status: AC
  Filled 2018-05-28: qty 100

## 2018-05-28 MED ORDER — PROPOFOL 10 MG/ML IV BOLUS
INTRAVENOUS | Status: AC
Start: 2018-05-28 — End: ?
  Filled 2018-05-28: qty 20

## 2018-05-28 MED ORDER — SODIUM CHLORIDE 0.9 % IV SOLN
INTRAVENOUS | Status: DC
  Administered 2018-05-28: 10:00:00 via INTRAVENOUS

## 2018-05-28 MED ORDER — OXYCODONE-ACETAMINOPHEN 5-325 MG PO TABS: 1.0000 | ORAL_TABLET | ORAL | 0 refills | Status: DC | PRN

## 2018-05-28 MED ORDER — LIDOCAINE HCL (PF) 1 % IJ SOLN
INTRAMUSCULAR | Status: AC
  Filled 2018-05-28: qty 30

## 2018-05-28 MED ORDER — FENTANYL CITRATE (PF) 100 MCG/2ML IJ SOLN
INTRAMUSCULAR | Status: DC | PRN
  Administered 2018-05-28: 50 ug via INTRAVENOUS
  Administered 2018-05-28: 25 ug via INTRAVENOUS

## 2018-05-28 MED ORDER — EPHEDRINE SULFATE 50 MG/ML IJ SOLN
INTRAMUSCULAR | Status: AC
  Filled 2018-05-28: qty 1

## 2018-05-28 MED ORDER — GLYCOPYRROLATE 0.2 MG/ML IJ SOLN
INTRAMUSCULAR | Status: DC | PRN
  Administered 2018-05-28: 0.2 mg via INTRAVENOUS

## 2018-05-28 MED ORDER — BUPIVACAINE-EPINEPHRINE (PF) 0.25% -1:200000 IJ SOLN
INTRAMUSCULAR | Status: AC
  Filled 2018-05-28: qty 30

## 2018-05-28 MED ORDER — ONDANSETRON HCL 4 MG/2ML IJ SOLN
INTRAMUSCULAR | Status: DC | PRN
  Administered 2018-05-28: 4 mg via INTRAVENOUS

## 2018-05-28 MED ORDER — HYDROMORPHONE HCL 1 MG/ML IJ SOLN
INTRAMUSCULAR | Status: AC
  Filled 2018-05-28: qty 1

## 2018-05-28 MED ORDER — PHENYLEPHRINE HCL 10 MG/ML IJ SOLN
INTRAMUSCULAR | Status: DC | PRN
  Administered 2018-05-28 (×2): 100 ug via INTRAVENOUS
  Administered 2018-05-28: 200 ug via INTRAVENOUS
  Administered 2018-05-28 (×2): 100 ug via INTRAVENOUS
  Administered 2018-05-28: 200 ug via INTRAVENOUS

## 2018-05-28 MED ORDER — LIDOCAINE HCL (PF) 2 % IJ SOLN
INTRAMUSCULAR | Status: AC
  Filled 2018-05-28: qty 10

## 2018-05-28 MED ORDER — FENTANYL CITRATE (PF) 100 MCG/2ML IJ SOLN: 25.0000 ug | INTRAMUSCULAR | Status: DC | PRN

## 2018-05-28 MED ORDER — ACETAMINOPHEN 10 MG/ML IV SOLN
INTRAVENOUS | Status: AC
  Filled 2018-05-28: qty 100

## 2018-05-28 MED ORDER — PROPOFOL 10 MG/ML IV BOLUS
INTRAVENOUS | Status: DC | PRN
  Administered 2018-05-28: 30 mg via INTRAVENOUS
  Administered 2018-05-28: 120 mg via INTRAVENOUS

## 2018-05-28 MED ORDER — LIDOCAINE HCL (CARDIAC) PF 100 MG/5ML IV SOSY
PREFILLED_SYRINGE | INTRAVENOUS | Status: DC | PRN
  Administered 2018-05-28: 100 mg via INTRAVENOUS

## 2018-05-28 MED ORDER — CEFAZOLIN SODIUM-DEXTROSE 2-4 GM/100ML-% IV SOLN
INTRAVENOUS | Status: AC
  Filled 2018-05-28: qty 100

## 2018-05-28 MED ORDER — CEFAZOLIN SODIUM-DEXTROSE 2-4 GM/100ML-% IV SOLN
2.0000 g | INTRAVENOUS | Status: AC
  Administered 2018-05-28: 2 g via INTRAVENOUS

## 2018-05-28 MED ORDER — MIDAZOLAM HCL 2 MG/2ML IJ SOLN
INTRAMUSCULAR | Status: DC | PRN
  Administered 2018-05-28: 2 mg via INTRAVENOUS

## 2018-05-28 MED ORDER — BUPIVACAINE-EPINEPHRINE 0.25% -1:200000 IJ SOLN
INTRAMUSCULAR | Status: DC | PRN
  Administered 2018-05-28: 20 mL

## 2018-05-28 MED ORDER — HEPARIN SODIUM (PORCINE) 10000 UNIT/ML IJ SOLN
INTRAMUSCULAR | Status: AC
  Filled 2018-05-28: qty 1

## 2018-05-28 MED ORDER — ACETAMINOPHEN 10 MG/ML IV SOLN
INTRAVENOUS | Status: DC | PRN
  Administered 2018-05-28: 1000 mg via INTRAVENOUS

## 2018-05-28 MED ORDER — HEPARIN SODIUM (PORCINE) 5000 UNIT/ML IJ SOLN
INTRAMUSCULAR | Status: AC
  Filled 2018-05-28: qty 1

## 2018-05-28 MED ORDER — EPHEDRINE SULFATE 50 MG/ML IJ SOLN
INTRAMUSCULAR | Status: DC | PRN
  Administered 2018-05-28: 15 mg via INTRAVENOUS
  Administered 2018-05-28 (×5): 10 mg via INTRAVENOUS

## 2018-05-28 MED ORDER — MIDAZOLAM HCL 2 MG/2ML IJ SOLN
INTRAMUSCULAR | Status: AC
  Filled 2018-05-28: qty 2

## 2018-05-28 MED ORDER — FENTANYL CITRATE (PF) 250 MCG/5ML IJ SOLN
INTRAMUSCULAR | Status: AC
  Filled 2018-05-28: qty 5

## 2018-05-28 SURGICAL SUPPLY — 37 items
BLADE SURG 15 STRL LF DISP TIS (BLADE) ×2 IMPLANT
BLADE SURG 15 STRL SS (BLADE) ×2
BLADE SURG SZ11 CARB STEEL (BLADE) ×4 IMPLANT
CANISTER SUCT 1200ML W/VALVE (MISCELLANEOUS) ×4 IMPLANT
CATH PALINDROME RT-P 15FX19CM (CATHETERS) ×2 IMPLANT
CATH PALINDROME RT-P 15FX23CM (CATHETERS) ×2 IMPLANT
CHLORAPREP W/TINT 26ML (MISCELLANEOUS) ×4 IMPLANT
DERMABOND ADVANCED (GAUZE/BANDAGES/DRESSINGS) ×2
DERMABOND ADVANCED .7 DNX12 (GAUZE/BANDAGES/DRESSINGS) ×2 IMPLANT
DRAPE C-ARM XRAY 36X54 (DRAPES) ×4 IMPLANT
DRAPE LAPAROTOMY 77X122 PED (DRAPES) ×4 IMPLANT
DRSG OPSITE POSTOP 4X6 (GAUZE/BANDAGES/DRESSINGS) ×2 IMPLANT
ELECT CAUTERY BLADE 6.4 (BLADE) ×4 IMPLANT
ELECT REM PT RETURN 9FT ADLT (ELECTROSURGICAL) ×4
ELECTRODE REM PT RTRN 9FT ADLT (ELECTROSURGICAL) ×2 IMPLANT
GLOVE SURG SYN 8.0 (GLOVE) ×4 IMPLANT
GLOVE SURG SYN 8.0 PF PI (GLOVE) ×2 IMPLANT
GOWN STRL REUS W/ TWL LRG LVL3 (GOWN DISPOSABLE) ×2 IMPLANT
GOWN STRL REUS W/ TWL XL LVL3 (GOWN DISPOSABLE) ×2 IMPLANT
GOWN STRL REUS W/TWL LRG LVL3 (GOWN DISPOSABLE) ×2
GOWN STRL REUS W/TWL XL LVL3 (GOWN DISPOSABLE) ×2
GRAFT COLLAGEN VASCULAR 8X45 (Miscellaneous) ×2 IMPLANT
NDL ENTRY 21GA 7CM ECHOTIP (NEEDLE) IMPLANT
NDL HYPO 25X1 1.5 SAFETY (NEEDLE) IMPLANT
NEEDLE ENTRY 21GA 7CM ECHOTIP (NEEDLE) ×4 IMPLANT
NEEDLE HYPO 25X1 1.5 SAFETY (NEEDLE) IMPLANT
PACK ANGIOGRAPHY (CUSTOM PROCEDURE TRAY) ×2 IMPLANT
PACK BASIN MINOR ARMC (MISCELLANEOUS) ×4 IMPLANT
SET INTRO CAPELLA COAXIAL (SET/KITS/TRAYS/PACK) ×2 IMPLANT
SPONGE LAP 18X18 RF (DISPOSABLE) ×2 IMPLANT
SUT MNCRL AB 4-0 PS2 18 (SUTURE) ×2 IMPLANT
SUT MNCRL+ 5-0 UNDYED PC-3 (SUTURE) ×2 IMPLANT
SUT MONOCRYL 5-0 (SUTURE) ×2
SUT PROLENE 0 CT 1 30 (SUTURE) ×2 IMPLANT
SUT VIC AB 3-0 SH 27 (SUTURE) ×4
SUT VIC AB 3-0 SH 27X BRD (SUTURE) ×4 IMPLANT
SYR 10ML LL (SYRINGE) IMPLANT

## 2018-05-28 NOTE — Discharge Instructions (Signed)

## 2018-05-28 NOTE — Anesthesia Preprocedure Evaluation (Signed)
Anesthesia Evaluation  Patient identified by MRN, date of birth, ID band Patient awake    Reviewed: Allergy & Precautions, NPO status , Patient's Chart, lab work & pertinent test results  History of Anesthesia Complications Negative for: history of anesthetic complications  Airway Mallampati: II       Dental  (+) Edentulous Upper, Edentulous Lower   Pulmonary neg shortness of breath, asthma , neg sleep apnea, neg COPD, former smoker,           Cardiovascular hypertension, Pt. on medications and Pt. on home beta blockers + CAD  (-) Past MI and (-) CHF (-) dysrhythmias (-) Valvular Problems/Murmurs     Neuro/Psych Seizures - (none in 6 yrs), Well Controlled,     GI/Hepatic Neg liver ROS, GERD  Medicated and Controlled,  Endo/Other  neg diabetes  Renal/GU Dialysis and ESRFRenal disease     Musculoskeletal   Abdominal   Peds  Hematology   Anesthesia Other Findings   Reproductive/Obstetrics                             Anesthesia Physical Anesthesia Plan  ASA: IV  Anesthesia Plan: General   Post-op Pain Management:    Induction: Intravenous  PONV Risk Score and Plan: 2 and Dexamethasone and Ondansetron  Airway Management Planned: LMA  Additional Equipment:   Intra-op Plan:   Post-operative Plan:   Informed Consent: I have reviewed the patients History and Physical, chart, labs and discussed the procedure including the risks, benefits and alternatives for the proposed anesthesia with the patient or authorized representative who has indicated his/her understanding and acceptance.     Plan Discussed with:   Anesthesia Plan Comments:         Anesthesia Quick Evaluation

## 2018-05-28 NOTE — Anesthesia Post-op Follow-up Note (Signed)
Anesthesia QCDR form completed.        

## 2018-05-28 NOTE — Transfer of Care (Signed)
Immediate Anesthesia Transfer of Care Note  Patient: Jeremy Rose  Procedure(s) Performed: REVISON OF ARTERIOVENOUS FISTULA ( REMOVE ANEURYSM ) (Left Arm Upper) INSERTION OF DIALYSIS CATHETER ( TUNNELED CATH INSERT ) (Left Chest)  Patient Location: PACU  Anesthesia Type:General  Level of Consciousness: awake and alert   Airway & Oxygen Therapy: Patient Spontanous Breathing and Patient connected to face mask oxygen  Post-op Assessment: Report given to RN and Post -op Vital signs reviewed and stable  Post vital signs: Reviewed  Last Vitals:  Vitals Value Taken Time  BP 109/69 05/28/2018  3:06 PM  Temp 36 C 05/28/2018  3:03 PM  Pulse 72 05/28/2018  3:06 PM  Resp 15 05/28/2018  3:06 PM  SpO2 100 % 05/28/2018  3:06 PM  Vitals shown include unvalidated device data.  Last Pain:  Vitals:   05/28/18 0921  TempSrc: Oral  PainSc: 4          Complications: No apparent anesthesia complications

## 2018-05-28 NOTE — Anesthesia Procedure Notes (Signed)
Procedure Name: LMA Insertion Performed by: Jonna Clark, CRNA Pre-anesthesia Checklist: Patient identified, Patient being monitored, Timeout performed, Emergency Drugs available and Suction available Patient Re-evaluated:Patient Re-evaluated prior to induction Oxygen Delivery Method: Circle system utilized Preoxygenation: Pre-oxygenation with 100% oxygen Induction Type: IV induction Ventilation: Mask ventilation without difficulty LMA: LMA inserted LMA Size: 4.5 Tube type: Oral Number of attempts: 1 Placement Confirmation: positive ETCO2 and breath sounds checked- equal and bilateral Tube secured with: Tape Dental Injury: Teeth and Oropharynx as per pre-operative assessment

## 2018-05-28 NOTE — OR Nursing (Signed)
Discharge instructions discussed with pt and Richardson Landry. Pt and Richardson Landry voice understanding.

## 2018-05-28 NOTE — H&P (Signed)
[facility] History & Physical Update  The patient was interviewed and re-examined.  The patient's previous History and Physical has been reviewed and is unchanged.  There is no change in the plan of care. We plan to proceed with the scheduled procedure.  Hortencia Pilar, MD  05/28/2018, 9:48 AM

## 2018-05-28 NOTE — Op Note (Signed)
OPERATIVE NOTE   PROCEDURE: 1.  left brachial cephalic arteriovenous graft placement with a 7 mm Artegraft 2.  Resection of painful cephalic vein aneurysm  PRE-OPERATIVE DIAGNOSIS: End Stage Renal Disease  POST-OPERATIVE DIAGNOSIS: End Stage Renal Disease  SURGEON: Hortencia Pilar  ASSISTANT(S): Ms. 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 male who presents with end stage renal disease.  The patient is scheduled for left brachial axillary AV graft placement.  The patient is aware the risks include but are not limited to: bleeding, infection, steal syndrome, nerve damage, ischemic monomelic neuropathy, failure to mature, and need for additional procedures.  The patient is aware of the risks of the procedure and elects to proceed forward.  DESCRIPTION: After full informed written consent was obtained from the patient, the patient was brought back to the operating room and placed supine upon the operating table.  Prior to induction, the patient received IV antibiotics.   After obtaining adequate anesthesia, the patient was then prepped and draped in the standard fashion for a left arm access procedure.  Quarter percent Marcaine with epinephrine is then infiltrated into the soft tissues overlying the cephalic vein aneurysm from the antecubital fossa to the level of the mid deltoid.   A linear incision was then created along the length of the cephalic vein dissection was carried down through the skin and then the soft tissues to expose the wall of the vein.  The dissection is begun at the level of the antecubital fossa and then extended up to the deltoid level.  Arterial anastomosis of the old fistula was exposed controlling the brachial artery proximally and distally.  The aneurysmal vein is then dissected  circumferentially ligating and dividing tributaries as they are encountered with 3-0 Vicryl or 201 3-0 silk ties.  In the proximal upper arm where the pain has returned toward normal caliber is dissected circumferentially for a distance of approximately 15 mm.  A 7 mm x 47 cm Artegraft is then opened on the back table and prepped in the appropriate fashion.  He was then marked with a surgical marker.  Vascular clamp was applied to the cephalic vein proximally and Vesseloops were used to control the brachial artery.  2-0 silk ties used to ligate the cephalic aneurysm proximally distally the aneurysm is then transected proximally distally and passed off the field the specimen.  Hemostasis was then obtained along the incision and the incision is then reapproximated using a running 3-0 Vicryl closing the subcutaneous tissues.  The proximal and distal areas are left as a gap.  The Kelly-Wick tunneler was then delivered onto the field and passed subcutaneously more laterally to the linear incision.  The marked Artegraft is then pulled through the subcutaneous tunnel and approximated to both the arterial and venous ends.  Endograft to brachial venous anastomosis is then created using running 6-0 Prolene.  Flushing maneuvers were performed and the graft was pressurized.  Is tried for appropriate length and tension.  Is then flushed with heparinized saline.  It is then transected proximally and an end-to-end Artegraft to vein anastomosis is fashioned using running 6-0 Prolene.  Flushing maneuvers were performed and the graft is then unclamped.  There was good  thrill in the venous outflow, and there was 1+ palpable radial pulse.  At this point, I irrigated out  the remaining surgical wounds.  There was no further active bleeding.  The subcutaneous tissue was reapproximated with a running stitch of 3-0 Vicryl.  The skin was then reapproximated with a running subcuticular stitch of 4-0 Vicryl.  The skin was then  cleaned, dried, and reinforced with Dermabond.    The patient tolerated this procedure well.   COMPLICATIONS: None  CONDITION: Jeremy Rose [facility]  Office: [phone]   05/28/2018, 1:53 PM

## 2018-05-29 NOTE — Anesthesia Postprocedure Evaluation (Signed)
Anesthesia Post Note  Patient: Jeremy Rose  Procedure(s) Performed: REVISON OF ARTERIOVENOUS FISTULA ( REMOVE ANEURYSM ) (Left Arm Upper) INSERTION OF DIALYSIS CATHETER ( TUNNELED CATH INSERT ) (Left Chest)  Patient location during evaluation: PACU Anesthesia Type: General Level of consciousness: awake and alert Pain management: pain level controlled Vital Signs Assessment: post-procedure vital signs reviewed and stable Respiratory status: spontaneous breathing, nonlabored ventilation, respiratory function stable and patient connected to nasal cannula oxygen Cardiovascular status: blood pressure returned to baseline and stable Postop Assessment: no apparent nausea or vomiting Anesthetic complications: no     Last Vitals:  Vitals:   05/28/18 1552 05/28/18 1630  BP: 107/65 104/67  Pulse: 70 65  Resp: 16 16  Temp: (!) 36.3 C 36.4 C  SpO2: 100% 100%    Last Pain:  Vitals:   05/28/18 1630  TempSrc:   PainSc: 0-No pain                 Martha Clan

## 2018-05-30 ENCOUNTER — Encounter: Payer: Self-pay | Admitting: Vascular Surgery

## 2018-05-31 ENCOUNTER — Telehealth (INDEPENDENT_AMBULATORY_CARE_PROVIDER_SITE_OTHER): Payer: Self-pay

## 2018-05-31 ENCOUNTER — Encounter: Payer: Self-pay | Admitting: Vascular Surgery

## 2018-05-31 NOTE — OR Nursing (Signed)
Follow up post op call with patient revealed concerns of some swelling in patients operative arm. Patient felt there was more swelling than he had on Saturday. He denied a lot of pain quoting a 3/10. He stated he had been keeping arm elevated. I asked patient if it would be ok for me to contact his dialysis center today since he went Saturday and is going again today so they may possibly have some comparison to how much more swelling is present and if it was ok to contact Dr Nino Parsley office and let them know of his concerns. He agreed. I left message at Dr Nino Parsley office with patients concerns and asked that they follow up with him. Contacted dialysis center and provided above information.  05/31/18 Gwenlyn Saran RN [phone]

## 2018-05-31 NOTE — Telephone Encounter (Signed)
Sherry at [facility] called and stated that during her phone conversation with the patient this morning that he stated that he has swelling, but his pain level is okay right now. She is going to call the [facility] so that they can take a closer look at the patients arm today. It is the arm that he had the AV fistula revision done on Friday.

## 2018-06-01 LAB — SURGICAL PATHOLOGY

## 2018-06-07 ENCOUNTER — Ambulatory Visit (INDEPENDENT_AMBULATORY_CARE_PROVIDER_SITE_OTHER): Payer: Medicare PPO | Admitting: Vascular Surgery

## 2018-06-08 ENCOUNTER — Ambulatory Visit (INDEPENDENT_AMBULATORY_CARE_PROVIDER_SITE_OTHER): Payer: Medicare PPO | Admitting: Vascular Surgery

## 2018-06-08 ENCOUNTER — Encounter (INDEPENDENT_AMBULATORY_CARE_PROVIDER_SITE_OTHER): Payer: Self-pay | Admitting: Vascular Surgery

## 2018-06-08 VITALS — BP 115/68 | HR 65 | Resp 16 | Ht 68.0 in | Wt 159.4 lb

## 2018-06-08 DIAGNOSIS — N186 End stage renal disease: Secondary | ICD-10-CM

## 2018-06-08 DIAGNOSIS — T829XXS Unspecified complication of cardiac and vascular prosthetic device, implant and graft, sequela: Secondary | ICD-10-CM

## 2018-06-08 DIAGNOSIS — Z992 Dependence on renal dialysis: Secondary | ICD-10-CM

## 2018-06-08 NOTE — Progress Notes (Signed)
Subjective:    Patient ID: Jeremy Rose, male    DOB: [birth year], 62 y.o.   MRN: [id number] Chief Complaint  Patient presents with  . Follow-up    [facility] 1week   Patient presents for his first postoperative follow-up.  The patient is status post a revision of his left upper extremity dialysis access with ligation of his previous aneurysmal fistula May 28, 2018.  The patient's postoperative course has been unremarkable.  The patient denies any issues with his incision.  The patient's left IJ PermCath has been functioning well.  The patient denies any left upper extremity hand or arm pain.  Denies any ulceration of the left hand.  Patient denies any fever, nausea vomiting.  Review of Systems  Constitutional: Negative.   HENT: Negative.   Eyes: Negative.   Respiratory: Negative.   Cardiovascular: Negative.   Gastrointestinal: Negative.   Endocrine: Negative.   Genitourinary: Negative.   Musculoskeletal: Negative.   Skin: Negative.   Allergic/Immunologic: Negative.   Neurological: Negative.   Hematological: Negative.   Psychiatric/Behavioral: Negative.       Objective:   Physical Exam  Constitutional: He is oriented to person, place, and time. He appears well-developed and well-nourished. No distress.  HENT:  Head: Normocephalic and atraumatic.  Right Ear: External ear normal.  Left Ear: External ear normal.  Eyes: Pupils are equal, round, and reactive to light. Conjunctivae are normal.  Neck: Normal range of motion.  Cardiovascular: Normal rate, regular rhythm, normal heart sounds and intact distal pulses.  Pulses:      Radial pulses are 2+ on the right side, and 2+ on the left side.  Left upper extremity: Doing well.  Good bruit and thrill from Artegraft.  Pulmonary/Chest: Effort normal and breath sounds normal.  Musculoskeletal: Normal range of motion.  Neurological: He is alert and oriented to person, place, and time.  Skin: Skin is warm and dry. He is not  diaphoretic.  Psychiatric: He has a normal mood and affect. His behavior is normal. Judgment and thought content normal.  Vitals reviewed.  BP 115/68 (BP Location: Right Arm)   Pulse 65   Resp 16   Ht 5\' 8"  (1.727 m)   Wt 159 lb 6.4 oz (72.3 kg)   BMI 24.24 kg/m   Past Medical History:  Diagnosis Date  . Arthritis    hands  . Asthma    uses inhaler  . Coronary artery disease   . Dialysis patient [facility]) 2012   Monday, Wednesday, Friday  . GERD (gastroesophageal reflux disease)   . Gout of right hand   . Hypertension   . Renal disorder    stage V..on dialysis  . Seizures ([place]) 2019   last seizure 5 years ago. had 20 in one day   Social History   Socioeconomic History  . Marital status: Single    Spouse name: Not on file  . Number of children: Not on file  . Years of education: Not on file  . Highest education level: Not on file  Occupational History  . Not on file  Social Needs  . Financial resource strain: Not on file  . Food insecurity:    Worry: Not on file    Inability: Not on file  . Transportation needs:    Medical: Not on file    Non-medical: Not on file  Tobacco Use  . Smoking status: Former Smoker    Types: Cigarettes  . Smokeless tobacco: Former [employer]  date: 2010  . Tobacco comment: did not inhale!!  Substance and Sexual Activity  . Alcohol use: No  . Drug use: No  . Sexual activity: Not Currently  Lifestyle  . Physical activity:    Days per week: Not on file    Minutes per session: Not on file  . Stress: Not on file  Relationships  . Social connections:    Talks on phone: Not on file    Gets together: Not on file    Attends religious service: Not on file    Active member of club or organization: Not on file    Attends meetings of clubs or organizations: Not on file    Relationship status: Not on file  . Intimate partner violence:    Fear of current or ex partner: Not on file    Emotionally abused: Not on file    Physically abused:  Not on file    Forced sexual activity: Not on file  Other Topics Concern  . Not on file  Social History Narrative  . Not on file   Past Surgical History:  Procedure Laterality Date  . A/V FISTULAGRAM Left 02/24/2017   Procedure: A/V Fistulagram;  Surgeon: Katha Cabal, MD;  Location: [facility];  Service: Cardiovascular;  Laterality: Left;  . A/V SHUNT INTERVENTION N/A 02/24/2017   Procedure: A/V Shunt Intervention;  Surgeon: Katha Cabal, MD;  Location: [facility];  Service: Cardiovascular;  Laterality: N/A;  . AV FISTULA PLACEMENT Left   . AV FISTULA PLACEMENT Left 12/02/2017   Procedure: ARTERIOVENOUS (AV) FISTULA CREATION ( BRACHIAL CEPHALIC );  Surgeon: Katha Cabal, MD;  Location: [facility];  Service: Vascular;  Laterality: Left;  . CARDIAC CATHETERIZATION  2012   no stents  . INSERTION OF DIALYSIS CATHETER Left 05/28/2018   Procedure: INSERTION OF DIALYSIS CATHETER ( TUNNELED CATH INSERT );  Surgeon: Katha Cabal, MD;  Location: [facility];  Service: Vascular;  Laterality: Left;  . LIGATION OF ARTERIOVENOUS  FISTULA Left 12/02/2017   Procedure: LIGATION OF ARTERIOVENOUS  FISTULA ( REMOVAL LEFT WRIST FISTULA );  Surgeon: Katha Cabal, MD;  Location: [facility];  Service: Vascular;  Laterality: Left;  . PERIPHERAL VASCULAR CATHETERIZATION Left 06/03/2016   Procedure: A/V Shuntogram/Fistulagram;  Surgeon: Katha Cabal, MD;  Location: [facility];  Service: Cardiovascular;  Laterality: Left;  . PERIPHERAL VASCULAR CATHETERIZATION  06/03/2016   Procedure: Peripheral Vascular Balloon Angioplasty;  Surgeon: Katha Cabal, MD;  Location: [facility];  Service: Cardiovascular;;  . REVISON OF ARTERIOVENOUS FISTULA Left 05/28/2018   Procedure: REVISON OF ARTERIOVENOUS FISTULA ( REMOVE ANEURYSM );  Surgeon: Katha Cabal, MD;  Location: [facility];  Service: Vascular;  Laterality: Left;  . UPPER EXTREMITY VENOGRAPHY N/A 04/13/2018    Procedure: UPPER EXTREMITY VENOGRAPHY;  Surgeon: Katha Cabal, MD;  Location: [facility];  Service: Cardiovascular;  Laterality: N/A;   Family History  Problem Relation Age of Onset  . Heart disease Mother   . Kidney disease Paternal Uncle    Allergies  Allergen Reactions  . Codeine Nausea And Vomiting  . Doxycycline Nausea And Vomiting      Assessment & Plan:  Patient presents for his first postoperative follow-up.  The patient is status post a revision of his left upper extremity dialysis access with ligation of his previous aneurysmal fistula May 28, 2018.  The patient's postoperative course has been unremarkable.  The patient denies  any issues with his incision.  The patient's left IJ PermCath has been functioning well.  The patient denies any left upper extremity hand or arm pain.  Denies any ulceration of the left hand.  Patient denies any fever, nausea vomiting.  1. ESRD (end stage renal disease) on dialysis ([facility]) - Stable Patient presents for his first postoperative follow-up The patient's postoperative course has been unremarkable Patient's physical exam is unremarkable The patient is to continue using his left IJ PermCath until his fistula matures The patient understands this I bring the patient back in approximately 1 month and have him undergo an HDA  - VAS US DUPLEX DIALYSIS ACCESS (AVF,AVG); Future - VAS US DUPLEX DIALYSIS ACCESS (AVF,AVG); Future  2. Complication of arteriovenous dialysis fistula, sequela - Stable As above  Current Outpatient Medications on File Prior to Visit  Medication Sig Dispense Refill  . albuterol (PROVENTIL HFA;VENTOLIN HFA) 108 (90 BASE) MCG/ACT inhaler Inhale into the lungs every 6 (six) hours as needed for wheezing or shortness of breath.    . allopurinol (ZYLOPRIM) 100 MG tablet Take 200 mg by mouth daily.    Marland Kitchen aspirin EC 81 MG tablet Take 81 mg by mouth daily.    . beclomethasone (QVAR) 80 MCG/ACT inhaler Inhale 1  puff into the lungs as needed (for shortness of breath).    . carvedilol (COREG CR) 10 MG 24 hr capsule Take 10 mg by mouth daily.    . cinacalcet (SENSIPAR) 60 MG tablet Take 120 mg by mouth daily.     . cyanocobalamin 1000 MCG tablet Take 1,000 mcg daily by mouth.    . felodipine (PLENDIL) 2.5 MG 24 hr tablet Take 2.5 mg by mouth daily.     Marland Kitchen ibuprofen (ADVIL,MOTRIN) 200 MG tablet Take 200 mg by mouth daily as needed for headache or moderate pain.    . Lacosamide (VIMPAT) 150 MG TABS Take 150 mg by mouth 2 (two) times daily. Take an 3rd dose after dialysis on M-W-F    . levETIRAcetam (KEPPRA) 500 MG tablet Take 500 mg by mouth 2 (two) times daily. Take a 3rd dose of 500 mgs after dialysis only on dialysis days (M-W-F)    . lidocaine-prilocaine (EMLA) cream Apply 1 application topically daily as needed (port access).     Marland Kitchen omeprazole (PRILOSEC) 20 MG capsule Take 20 mg by mouth daily.    Marland Kitchen oxyCODONE-acetaminophen (PERCOCET) 5-325 MG tablet Take 1 tablet by mouth every 4 (four) hours as needed for severe pain. 40 tablet 0  . sevelamer carbonate (RENVELA) 800 MG tablet Take 800-2,400 mg by mouth See admin instructions. Take 2400 mg with each meal and 800 mg with each snack     No current facility-administered medications on file prior to visit.    There are no Patient Instructions on file for this visit. No follow-ups on file.  Chadd Tollison A Gayle Martinez, PA-C

## 2018-07-15 ENCOUNTER — Ambulatory Visit: Payer: Medicare PPO | Admitting: Podiatry

## 2018-08-05 ENCOUNTER — Encounter (INDEPENDENT_AMBULATORY_CARE_PROVIDER_SITE_OTHER): Payer: Self-pay | Admitting: Vascular Surgery

## 2018-08-05 ENCOUNTER — Ambulatory Visit (INDEPENDENT_AMBULATORY_CARE_PROVIDER_SITE_OTHER): Payer: Medicare PPO

## 2018-08-05 ENCOUNTER — Ambulatory Visit (INDEPENDENT_AMBULATORY_CARE_PROVIDER_SITE_OTHER): Payer: Medicare PPO | Admitting: Vascular Surgery

## 2018-08-05 VITALS — BP 145/78 | HR 70 | Resp 15 | Ht 63.0 in | Wt 162.0 lb

## 2018-08-05 DIAGNOSIS — Z992 Dependence on renal dialysis: Secondary | ICD-10-CM | POA: Diagnosis not present

## 2018-08-05 DIAGNOSIS — T829XXS Unspecified complication of cardiac and vascular prosthetic device, implant and graft, sequela: Secondary | ICD-10-CM

## 2018-08-05 DIAGNOSIS — N186 End stage renal disease: Secondary | ICD-10-CM

## 2018-08-05 NOTE — Progress Notes (Signed)
Subjective:    Patient ID: Jeremy Rose, male    DOB: December 01, 1955, 62 y.o.   MRN: [id number] Chief Complaint  Patient presents with  . Follow-up    1 month HDA follow up   The patient was last seen on June 08, 2018 for his first postoperative visit.  The patient is status post a left brachiocephalic AV fistula revision on May 28, 2018.  He presents today for his first HDA.  The patient is currently being maintained by a PermCath without issue.  The patient denies any issues with his left upper extremity surgical incision.  He notes that it is healed well.  The patient denies any left upper extremity pain or swelling.  The patient denies any left hand pain / ulceration.  The patient underwent a left upper extremity HDA which is notable for a total flow volume of 3357.  Outflow vein with PSVT of over 100 with the exception of the mid UA (513).  As per ultrasound interpretation that area stenotic either a retained valve and/or competing branch.  The patient denies any symptoms.  The patient denies any fever, nausea or vomiting.  Review of Systems  Constitutional: Negative.   HENT: Negative.   Eyes: Negative.   Respiratory: Negative.   Cardiovascular: Negative.   Gastrointestinal: Negative.   Endocrine: Negative.   Genitourinary:       ESRD  Musculoskeletal: Negative.   Skin: Negative.   Allergic/Immunologic: Negative.   Neurological: Negative.   Hematological: Negative.   Psychiatric/Behavioral: Negative.       Objective:   Physical Exam  Constitutional: He is oriented to person, place, and time. He appears well-developed and well-nourished. No distress.  HENT:  Head: Normocephalic and atraumatic.  Right Ear: External ear normal.  Left Ear: External ear normal.  Eyes: Pupils are equal, round, and reactive to light. Conjunctivae and EOM are normal.  Neck: Normal range of motion.  Cardiovascular: Normal rate, regular rhythm, normal heart sounds and intact distal pulses.    Pulses:      Radial pulses are 2+ on the right side, and 2+ on the left side.  Left upper extremity: Patient has healed well.  Artegraft is palpable.  There is a good bruit and thrill.  2+ radial pulse.  Hand is warm.  Good capillary refill. PermCath is in place.  No signs of infection noted.  Pulmonary/Chest: Effort normal and breath sounds normal.  Musculoskeletal: Normal range of motion.  Neurological: He is alert and oriented to person, place, and time.  Skin: Skin is warm and dry. He is not diaphoretic.  Psychiatric: He has a normal mood and affect. His behavior is normal. Judgment and thought content normal.  Vitals reviewed.  BP (!) 145/78 (BP Location: Right Arm, Patient Position: Sitting)   Pulse 70   Resp 15   Ht 5\' 3"  (1.6 m)   Wt 162 lb (73.5 kg)   BMI 28.70 kg/m   Past Medical History:  Diagnosis Date  . Arthritis    hands  . Asthma    uses inhaler  . Coronary artery disease   . Dialysis patient [facility]) 2012   Monday, Wednesday, Friday  . GERD (gastroesophageal reflux disease)   . Gout of right hand   . Hypertension   . Renal disorder    stage V..on dialysis  . Seizures ([place]) 2019   last seizure 5 years ago. had 20 in one day   Social History   Socioeconomic History  . Marital  status: Single    Spouse name: Not on file  . Number of children: Not on file  . Years of education: Not on file  . Highest education level: Not on file  Occupational History  . Not on file  Social Needs  . Financial resource strain: Not on file  . Food insecurity:    Worry: Not on file    Inability: Not on file  . Transportation needs:    Medical: Not on file    Non-medical: Not on file  Tobacco Use  . Smoking status: Former Smoker    Types: Cigarettes  . Smokeless tobacco: Former Systems developer    Quit date: 2010  . Tobacco comment: did not inhale!!  Substance and Sexual Activity  . Alcohol use: No  . Drug use: No  . Sexual activity: Not Currently  Lifestyle  . Physical  activity:    Days per week: Not on file    Minutes per session: Not on file  . Stress: Not on file  Relationships  . Social connections:    Talks on phone: Not on file    Gets together: Not on file    Attends religious service: Not on file    Active member of club or organization: Not on file    Attends meetings of clubs or organizations: Not on file    Relationship status: Not on file  . Intimate partner violence:    Fear of current or ex partner: Not on file    Emotionally abused: Not on file    Physically abused: Not on file    Forced sexual activity: Not on file  Other Topics Concern  . Not on file  Social History Narrative  . Not on file   Past Surgical History:  Procedure Laterality Date  . A/V FISTULAGRAM Left 02/24/2017   Procedure: A/V Fistulagram;  Surgeon: Katha Cabal, MD;  Location: [place] [facility];  Service: Cardiovascular;  Laterality: Left;  . A/V SHUNT INTERVENTION N/A 02/24/2017   Procedure: A/V Shunt Intervention;  Surgeon: Katha Cabal, MD;  Location: [place] [facility];  Service: Cardiovascular;  Laterality: N/A;  . AV FISTULA PLACEMENT Left   . AV FISTULA PLACEMENT Left 12/02/2017   Procedure: ARTERIOVENOUS (AV) FISTULA CREATION ( BRACHIAL CEPHALIC );  Surgeon: Katha Cabal, MD;  Location: [facility];  Service: Vascular;  Laterality: Left;  . CARDIAC CATHETERIZATION  2012   no stents  . INSERTION OF DIALYSIS CATHETER Left 05/28/2018   Procedure: INSERTION OF DIALYSIS CATHETER ( TUNNELED CATH INSERT );  Surgeon: Katha Cabal, MD;  Location: [facility];  Service: Vascular;  Laterality: Left;  . LIGATION OF ARTERIOVENOUS  FISTULA Left 12/02/2017   Procedure: LIGATION OF ARTERIOVENOUS  FISTULA ( REMOVAL LEFT WRIST FISTULA );  Surgeon: Katha Cabal, MD;  Location: [facility];  Service: Vascular;  Laterality: Left;  . PERIPHERAL VASCULAR CATHETERIZATION Left 06/03/2016   Procedure: A/V Shuntogram/Fistulagram;  Surgeon: Katha Cabal,  MD;  Location: [place] [facility];  Service: Cardiovascular;  Laterality: Left;  . PERIPHERAL VASCULAR CATHETERIZATION  06/03/2016   Procedure: Peripheral Vascular Balloon Angioplasty;  Surgeon: Katha Cabal, MD;  Location: [place] [facility];  Service: Cardiovascular;;  . REVISON OF ARTERIOVENOUS FISTULA Left 05/28/2018   Procedure: REVISON OF ARTERIOVENOUS FISTULA ( REMOVE ANEURYSM );  Surgeon: Katha Cabal, MD;  Location: [facility];  Service: Vascular;  Laterality: Left;  . UPPER EXTREMITY VENOGRAPHY N/A 04/13/2018   Procedure: UPPER EXTREMITY  VENOGRAPHY;  Surgeon: Katha Cabal, MD;  Location: [place] [facility];  Service: Cardiovascular;  Laterality: N/A;   Family History  Problem Relation Age of Onset  . Heart disease Mother   . Kidney disease Paternal Uncle    Allergies  Allergen Reactions  . Codeine Nausea And Vomiting  . Doxycycline Nausea And Vomiting      Assessment & Plan:  The patient was last seen on June 08, 2018 for his first postoperative visit.  The patient is status post a left brachiocephalic AV fistula revision on May 28, 2018.  He presents today for his first HDA.  The patient is currently being maintained by a PermCath without issue.  The patient denies any issues with his left upper extremity surgical incision.  He notes that it is healed well.  The patient denies any left upper extremity pain or swelling.  The patient denies any left hand pain / ulceration.  The patient underwent a left upper extremity HDA which is notable for a total flow volume of 3357.  Outflow vein with PSVT of over 100 with the exception of the mid UA (513).  As per ultrasound interpretation that area stenotic either a retained valve and/or competing branch.  The patient denies any symptoms.  The patient denies any fever, nausea or vomiting.  1. Complication of arteriovenous dialysis fistula, sequela - Stable Patient is status post a revision of his left upper extremity fistula  with an Artegraft on May 28, 2018 Patient presents today for his first post operative HDA There is one area of narrowing as per the sonographer interpretation notes possible valve however the patient was revised with an [place].   Patient with an adequate total flow volume I think is reasonable to start dialysis through the patient's revised access. If he is unable to dialyze adequately he may need to undergo a fistulogram and the patient understands that. He dialyzes appropriately the dialysis center will contact her office to have his PermCath removed.  Understands this as well. The patient is to follow-up every 6 months and undergo an HDA for surveillance.  - VAS US DUPLEX DIALYSIS ACCESS (AVF,AVG); Future  2. ESRD (end stage renal disease) on dialysis ([place]) - Stable As above  - VAS US DUPLEX DIALYSIS ACCESS (AVF,AVG); Future  Current Outpatient Medications on File Prior to Visit  Medication Sig Dispense Refill  . albuterol (PROVENTIL HFA;VENTOLIN HFA) 108 (90 BASE) MCG/ACT inhaler Inhale into the lungs every 6 (six) hours as needed for wheezing or shortness of breath.    . allopurinol (ZYLOPRIM) 100 MG tablet Take 200 mg by mouth daily.    Marland Kitchen aspirin EC 81 MG tablet Take 81 mg by mouth daily.    . beclomethasone (QVAR) 80 MCG/ACT inhaler Inhale 1 puff into the lungs as needed (for shortness of breath).    . carvedilol (COREG CR) 10 MG 24 hr capsule Take 10 mg by mouth daily.    . cinacalcet (SENSIPAR) 60 MG tablet Take 120 mg by mouth daily.     . cyanocobalamin 1000 MCG tablet Take 1,000 mcg daily by mouth.    . felodipine (PLENDIL) 2.5 MG 24 hr tablet Take 2.5 mg by mouth daily.     Marland Kitchen ibuprofen (ADVIL,MOTRIN) 200 MG tablet Take 200 mg by mouth daily as needed for headache or moderate pain.    . Lacosamide (VIMPAT) 150 MG TABS Take 150 mg by mouth 2 (two) times daily. Take an 3rd dose after dialysis on M-W-F    .  levETIRAcetam (KEPPRA) 500 MG tablet Take 500 mg by mouth 2 (two)  times daily. Take a 3rd dose of 500 mgs after dialysis only on dialysis days (M-W-F)    . lidocaine-prilocaine (EMLA) cream Apply 1 application topically daily as needed (port access).     Marland Kitchen omeprazole (PRILOSEC) 20 MG capsule Take 20 mg by mouth daily.    Marland Kitchen oxyCODONE-acetaminophen (PERCOCET) 5-325 MG tablet Take 1 tablet by mouth every 4 (four) hours as needed for severe pain. 40 tablet 0  . sevelamer carbonate (RENVELA) 800 MG tablet Take 800-2,400 mg by mouth See admin instructions. Take 2400 mg with each meal and 800 mg with each snack     No current facility-administered medications on file prior to visit.    There are no Patient Instructions on file for this visit. No follow-ups on file.  Dontee Jaso A Hema Lanza, PA-C

## 2018-08-18 ENCOUNTER — Encounter (INDEPENDENT_AMBULATORY_CARE_PROVIDER_SITE_OTHER): Payer: Self-pay

## 2018-08-20 ENCOUNTER — Other Ambulatory Visit (INDEPENDENT_AMBULATORY_CARE_PROVIDER_SITE_OTHER): Payer: Self-pay | Admitting: Nurse Practitioner

## 2018-08-23 MED ORDER — CEFAZOLIN SODIUM-DEXTROSE 1-4 GM/50ML-% IV SOLN: 1.0000 g | Freq: Once | INTRAVENOUS | Status: DC

## 2018-08-24 ENCOUNTER — Ambulatory Visit
Admission: RE | Admit: 2018-08-24 | Discharge: 2018-08-24 | Disposition: A | Discharge status: Home / Self Care | Payer: Medicare PPO | Source: Ambulatory Visit | Attending: Vascular Surgery | Admitting: Vascular Surgery

## 2018-08-24 ENCOUNTER — Encounter
Admission: RE | Disposition: A | Discharge status: Home / Self Care | Payer: Self-pay | Source: Ambulatory Visit | Attending: Vascular Surgery

## 2018-08-24 DIAGNOSIS — T829XXA Unspecified complication of cardiac and vascular prosthetic device, implant and graft, initial encounter: Secondary | ICD-10-CM | POA: Insufficient documentation

## 2018-08-24 DIAGNOSIS — N186 End stage renal disease: Secondary | ICD-10-CM

## 2018-08-24 DIAGNOSIS — Z992 Dependence on renal dialysis: Secondary | ICD-10-CM | POA: Diagnosis not present

## 2018-08-24 HISTORY — PX: DIALYSIS/PERMA CATHETER REMOVAL: CATH118289

## 2018-08-24 SURGERY — DIALYSIS/PERMA CATHETER REMOVAL
Anesthesia: LOCAL

## 2018-08-24 MED ORDER — SODIUM CHLORIDE 0.9 % IV SOLN: INTRAVENOUS | Status: DC

## 2018-08-24 MED ORDER — ONDANSETRON HCL 4 MG/2ML IJ SOLN: 4.0000 mg | Freq: Four times a day (QID) | INTRAMUSCULAR | Status: DC | PRN

## 2018-08-24 MED ORDER — LIDOCAINE-EPINEPHRINE (PF) 1 %-1:200000 IJ SOLN
INTRAMUSCULAR | Status: AC
  Filled 2018-08-24: qty 30

## 2018-08-24 MED ORDER — HYDROMORPHONE HCL 1 MG/ML IJ SOLN: 1.0000 mg | Freq: Once | INTRAMUSCULAR | Status: DC | PRN

## 2018-08-24 MED ORDER — LIDOCAINE-EPINEPHRINE (PF) 1 %-1:200000 IJ SOLN
INTRAMUSCULAR | Status: DC | PRN
Start: 2018-08-24 — End: 2018-08-24
  Administered 2018-08-24: 20 mL via INTRADERMAL

## 2018-08-24 SURGICAL SUPPLY — 7 items
BLADE SURG SZ11 CARB STEEL (BLADE) ×2 IMPLANT
DERMABOND ADVANCED (GAUZE/BANDAGES/DRESSINGS) ×1
DERMABOND ADVANCED .7 DNX12 (GAUZE/BANDAGES/DRESSINGS) ×1 IMPLANT
FORCEPS HALSTEAD CVD 5IN STRL (INSTRUMENTS) ×2 IMPLANT
PREP CHG 10.5 TEAL (MISCELLANEOUS) ×4 IMPLANT
SUT MNCRL AB 4-0 PS2 18 (SUTURE) ×2 IMPLANT
TRAY LACERAT/PLASTIC (MISCELLANEOUS) ×2 IMPLANT

## 2018-08-24 NOTE — Discharge Instructions (Signed)
Tunneled Catheter Removal, Care After °Refer to this sheet in the next few weeks. These instructions provide you with information about caring for yourself after your procedure. Your health care provider may also give you more specific instructions. Your treatment has been planned according to current medical practices, but problems sometimes occur. Call your health care provider if you have any problems or questions after your procedure. °What can I expect after the procedure? °After the procedure, it is common to have: °· Some mild redness, swelling, and pain around your catheter site. ° ° °Follow these instructions at home: °Incision care  °· Check your removal site  every day for signs of infection. Check for: °¨ More redness, swelling, or pain. °¨ More fluid or blood. °¨ Warmth. °¨ Pus or a bad smell. °· Follow instructions from your health care provider about how to take care of your removal site. Make sure you: °¨ Wash your hands with soap and water before you change your bandages (dressings). If soap and water are not available, use hand sanitizer. °Activity  °· Return to your normal activities as told by your health care provider. Ask your health care provider what activities are safe for you. °· Do not lift anything that is heavier than 10 lb (4.5 kg) for 3 weeks or as long as told by your health care provider. ° °Contact a health care provider if: °· You have more fluid or blood coming from your removal site °· You have more redness, swelling, or pain at your incisions or around the area where your catheter was removed °· Your removal site feel warm to the touch. °· You feel unusually weak. °· You feel nauseous.. °· Get help right away if °· You have swelling in your arm, shoulder, neck, or face. °· You develop chest pain. °· You have difficulty breathing. °· You feel dizzy or light-headed. °· You have pus or a bad smell coming from your removal site °· You have a fever. °· You develop bleeding from your  removal site, and your bleeding does not stop. °This information is not intended to replace advice given to you by your health care provider. Make sure you discuss any questions you have with your health care provider. °Document Released: 10/20/2012 Document Revised: 07/06/2016 Document Reviewed: 07/30/2015 °Elsevier Interactive Patient Education © 2017 [employer] ° °

## 2018-08-24 NOTE — H&P (Signed)
[facility] History & Physical Update  The patient was interviewed and re-examined.  The patient's previous History and Physical has been reviewed and is unchanged.  There is no change in the plan of care. We plan to proceed with the scheduled procedure.  Hortencia Pilar, MD  08/24/2018, 4:33 PM

## 2018-08-24 NOTE — Op Note (Signed)
  OPERATIVE NOTE   PROCEDURE: 1. Removal of a left IJ tunneled dialysis catheter  PRE-OPERATIVE DIAGNOSIS: Complication of dialysis catheter  POST-OPERATIVE DIAGNOSIS: Same  SURGEON: Hortencia Pilar  ANESTHESIA: Local anesthetic with 1% lidocaine with epinephrine   ESTIMATED BLOOD LOSS: Minimal   FINDING(S): 1. Catheter intact   SPECIMEN(S):  Catheter  INDICATIONS:   Jeremy Rose is a 62 y.o. male who presents with working left arm AV graft.  His catheter is no longer functioning and he does not require an indwelling tunneled catheter and therefore it is being removed.  Risks and benefits were reviewed all questions answered patient agrees to proceed..  DESCRIPTION: After obtaining full informed written consent, the patient was positioned supine. The left IJ catheter and surrounding area is prepped and draped in a sterile fashion. The cuff was localized by palpation and noted to be greater than 3 cm from the exit site. After appropriate timeout is called, 1% lidocaine with epinephrine is infiltrated into the surrounding tissues around the cuff. Small transverse incision is created with an 11 blade scalpel and the dissection was carried down to expose the cuff of the tunneled catheter.  The catheter is then freed from the surrounding attachments and adhesions. Once the catheter has been freed circumferentially it is transected just distal to the cuff and subsequently removed in 2 pieces. Light pressure was held at the base of the neck. A 4-0 Monocryl was used close the tunnel in the subcutaneous space. The 4-0 Monocryl Monocryl was then used to close the skin in a subcuticular stitch. Dermabond is applied.  Antibiotic ointment and a sterile dressing is applied to the exit site. Patient tolerated procedure well and there were no complications.  COMPLICATIONS: None  CONDITION: Unchanged  Hortencia Pilar. [facility] Office: [phone]  08/24/2018,4:59 PM

## 2018-08-25 ENCOUNTER — Encounter: Payer: Self-pay | Admitting: Vascular Surgery

## 2018-08-30 ENCOUNTER — Other Ambulatory Visit: Payer: Self-pay

## 2018-08-30 ENCOUNTER — Observation Stay (HOSPITAL_COMMUNITY)
Admission: EM | Admit: 2018-08-30 | Discharge: 2018-08-31 | Disposition: A | Discharge status: Home / Self Care | Payer: Medicare PPO | Source: Home / Self Care | Attending: Emergency Medicine | Admitting: Emergency Medicine

## 2018-08-30 ENCOUNTER — Emergency Department: Payer: Medicare PPO

## 2018-08-30 DIAGNOSIS — I2510 Atherosclerotic heart disease of native coronary artery without angina pectoris: Secondary | ICD-10-CM

## 2018-08-30 DIAGNOSIS — R55 Syncope and collapse: Secondary | ICD-10-CM | POA: Diagnosis present

## 2018-08-30 DIAGNOSIS — Z8249 Family history of ischemic heart disease and other diseases of the circulatory system: Secondary | ICD-10-CM

## 2018-08-30 DIAGNOSIS — N186 End stage renal disease: Secondary | ICD-10-CM

## 2018-08-30 DIAGNOSIS — M19042 Primary osteoarthritis, left hand: Secondary | ICD-10-CM | POA: Insufficient documentation

## 2018-08-30 DIAGNOSIS — D500 Iron deficiency anemia secondary to blood loss (chronic): Secondary | ICD-10-CM | POA: Diagnosis present

## 2018-08-30 DIAGNOSIS — K219 Gastro-esophageal reflux disease without esophagitis: Secondary | ICD-10-CM

## 2018-08-30 DIAGNOSIS — N2581 Secondary hyperparathyroidism of renal origin: Secondary | ICD-10-CM | POA: Diagnosis present

## 2018-08-30 DIAGNOSIS — Z79899 Other long term (current) drug therapy: Secondary | ICD-10-CM | POA: Diagnosis not present

## 2018-08-30 DIAGNOSIS — R112 Nausea with vomiting, unspecified: Secondary | ICD-10-CM | POA: Diagnosis present

## 2018-08-30 DIAGNOSIS — Z992 Dependence on renal dialysis: Secondary | ICD-10-CM | POA: Insufficient documentation

## 2018-08-30 DIAGNOSIS — K2980 Duodenitis without bleeding: Secondary | ICD-10-CM | POA: Diagnosis present

## 2018-08-30 DIAGNOSIS — I120 Hypertensive chronic kidney disease with stage 5 chronic kidney disease or end stage renal disease: Secondary | ICD-10-CM | POA: Diagnosis present

## 2018-08-30 DIAGNOSIS — D649 Anemia, unspecified: Secondary | ICD-10-CM | POA: Diagnosis not present

## 2018-08-30 DIAGNOSIS — Z87891 Personal history of nicotine dependence: Secondary | ICD-10-CM | POA: Insufficient documentation

## 2018-08-30 DIAGNOSIS — M109 Gout, unspecified: Secondary | ICD-10-CM | POA: Diagnosis present

## 2018-08-30 DIAGNOSIS — G40909 Epilepsy, unspecified, not intractable, without status epilepticus: Secondary | ICD-10-CM

## 2018-08-30 DIAGNOSIS — I10 Essential (primary) hypertension: Secondary | ICD-10-CM | POA: Diagnosis present

## 2018-08-30 DIAGNOSIS — K921 Melena: Secondary | ICD-10-CM | POA: Diagnosis present

## 2018-08-30 DIAGNOSIS — K3189 Other diseases of stomach and duodenum: Secondary | ICD-10-CM | POA: Diagnosis present

## 2018-08-30 DIAGNOSIS — D631 Anemia in chronic kidney disease: Secondary | ICD-10-CM | POA: Diagnosis present

## 2018-08-30 DIAGNOSIS — D509 Iron deficiency anemia, unspecified: Secondary | ICD-10-CM | POA: Diagnosis present

## 2018-08-30 DIAGNOSIS — Z7982 Long term (current) use of aspirin: Secondary | ICD-10-CM

## 2018-08-30 DIAGNOSIS — K449 Diaphragmatic hernia without obstruction or gangrene: Secondary | ICD-10-CM | POA: Insufficient documentation

## 2018-08-30 DIAGNOSIS — K2970 Gastritis, unspecified, without bleeding: Secondary | ICD-10-CM

## 2018-08-30 DIAGNOSIS — R3911 Hesitancy of micturition: Secondary | ICD-10-CM | POA: Diagnosis present

## 2018-08-30 DIAGNOSIS — D62 Acute posthemorrhagic anemia: Secondary | ICD-10-CM | POA: Insufficient documentation

## 2018-08-30 DIAGNOSIS — K922 Gastrointestinal hemorrhage, unspecified: Principal | ICD-10-CM | POA: Diagnosis present

## 2018-08-30 DIAGNOSIS — R339 Retention of urine, unspecified: Secondary | ICD-10-CM | POA: Diagnosis not present

## 2018-08-30 DIAGNOSIS — J45909 Unspecified asthma, uncomplicated: Secondary | ICD-10-CM | POA: Insufficient documentation

## 2018-08-30 DIAGNOSIS — R3915 Urgency of urination: Secondary | ICD-10-CM | POA: Diagnosis present

## 2018-08-30 DIAGNOSIS — Z885 Allergy status to narcotic agent status: Secondary | ICD-10-CM

## 2018-08-30 DIAGNOSIS — Z881 Allergy status to other antibiotic agents status: Secondary | ICD-10-CM

## 2018-08-30 DIAGNOSIS — K2900 Acute gastritis without bleeding: Secondary | ICD-10-CM

## 2018-08-30 DIAGNOSIS — M19041 Primary osteoarthritis, right hand: Secondary | ICD-10-CM

## 2018-08-30 DIAGNOSIS — Z7951 Long term (current) use of inhaled steroids: Secondary | ICD-10-CM

## 2018-08-30 LAB — BASIC METABOLIC PANEL
Anion gap: 12 (ref 5–15)
BUN: 28 mg/dL — ABNORMAL HIGH (ref 8–23)
CALCIUM: 8.2 mg/dL — AB (ref 8.9–10.3)
CHLORIDE: 94 mmol/L — AB (ref 98–111)
CO2: 28 mmol/L (ref 22–32)
Creatinine, Ser: 4.85 mg/dL — ABNORMAL HIGH (ref 0.61–1.24)
GFR calc Af Amer: 14 mL/min — ABNORMAL LOW (ref >60)
GFR calc non Af Amer: 12 mL/min — ABNORMAL LOW (ref >60)
Glucose, Bld: 109 mg/dL — ABNORMAL HIGH (ref 70–99)
Potassium: 3.2 mmol/L — ABNORMAL LOW (ref 3.5–5.1)
Sodium: 134 mmol/L — ABNORMAL LOW (ref 135–145)

## 2018-08-30 LAB — CBC
HCT: 22.8 % — ABNORMAL LOW (ref 39.0–52.0)
Hemoglobin: 7.6 g/dL — ABNORMAL LOW (ref 13.0–17.0)
MCH: 31.8 pg (ref 26.0–34.0)
MCHC: 33.3 g/dL (ref 30.0–36.0)
MCV: 95.4 fL (ref 80.0–100.0)
NRBC: 0 % (ref 0.0–0.2)
PLATELETS: 207 10*3/uL (ref 150–400)
RBC: 2.39 MIL/uL — AB (ref 4.22–5.81)
RDW: 13.4 % (ref 11.5–15.5)
WBC: 6.3 10*3/uL (ref 4.0–10.5)

## 2018-08-30 LAB — MRSA PCR SCREENING: MRSA BY PCR: NEGATIVE

## 2018-08-30 LAB — TROPONIN I: Troponin I: 0.03 ng/mL (ref <0.03)

## 2018-08-30 LAB — HEMOGLOBIN: Hemoglobin: 7.1 g/dL — ABNORMAL LOW (ref 13.0–17.0)

## 2018-08-30 MED ORDER — CINACALCET HCL 30 MG PO TABS
120.0000 mg | ORAL_TABLET | Freq: Every day | ORAL | Status: DC
  Filled 2018-08-30: qty 4

## 2018-08-30 MED ORDER — PANTOPRAZOLE SODIUM 40 MG PO TBEC
40.0000 mg | DELAYED_RELEASE_TABLET | Freq: Two times a day (BID) | ORAL | Status: DC
  Filled 2018-08-30: qty 1

## 2018-08-30 MED ORDER — LACOSAMIDE 50 MG PO TABS
150.0000 mg | ORAL_TABLET | Freq: Two times a day (BID) | ORAL | Status: DC
  Administered 2018-08-30 – 2018-08-31 (×2): 150 mg via ORAL
  Filled 2018-08-30 (×2): qty 3

## 2018-08-30 MED ORDER — ONDANSETRON HCL 4 MG/2ML IJ SOLN: 4.0000 mg | Freq: Four times a day (QID) | INTRAMUSCULAR | Status: DC | PRN

## 2018-08-30 MED ORDER — ACETAMINOPHEN 325 MG PO TABS: 650.0000 mg | ORAL_TABLET | Freq: Four times a day (QID) | ORAL | Status: DC | PRN

## 2018-08-30 MED ORDER — VITAMIN B-12 1000 MCG PO TABS
1000.0000 ug | ORAL_TABLET | Freq: Every day | ORAL | Status: DC
  Administered 2018-08-30 – 2018-08-31 (×2): 1000 ug via ORAL
  Filled 2018-08-30 (×2): qty 1

## 2018-08-30 MED ORDER — SEVELAMER CARBONATE 800 MG PO TABS: 2400.0000 mg | ORAL_TABLET | Freq: Three times a day (TID) | ORAL | Status: DC

## 2018-08-30 MED ORDER — ALLOPURINOL 100 MG PO TABS
200.0000 mg | ORAL_TABLET | Freq: Every day | ORAL | Status: DC
  Administered 2018-08-30 – 2018-08-31 (×2): 200 mg via ORAL
  Filled 2018-08-30 (×2): qty 2

## 2018-08-30 MED ORDER — ONDANSETRON HCL 4 MG PO TABS: 4.0000 mg | ORAL_TABLET | Freq: Four times a day (QID) | ORAL | Status: DC | PRN

## 2018-08-30 MED ORDER — LACOSAMIDE 50 MG PO TABS: 150.0000 mg | ORAL_TABLET | ORAL | Status: DC

## 2018-08-30 MED ORDER — FUROSEMIDE 10 MG/ML IJ SOLN
20.0000 mg | Freq: Once | INTRAMUSCULAR | Status: AC
  Administered 2018-08-31: 20 mg via INTRAVENOUS
  Filled 2018-08-30: qty 4

## 2018-08-30 MED ORDER — LEVETIRACETAM 500 MG PO TABS
500.0000 mg | ORAL_TABLET | ORAL | Status: DC
  Administered 2018-08-30: 500 mg via ORAL

## 2018-08-30 MED ORDER — ALBUTEROL SULFATE (2.5 MG/3ML) 0.083% IN NEBU
2.5000 mg | INHALATION_SOLUTION | RESPIRATORY_TRACT | Status: DC | PRN
  Filled 2018-08-30: qty 3

## 2018-08-30 MED ORDER — ALBUTEROL SULFATE (2.5 MG/3ML) 0.083% IN NEBU
2.5000 mg | INHALATION_SOLUTION | Freq: Once | RESPIRATORY_TRACT | Status: AC
  Administered 2018-08-30: 2.5 mg via RESPIRATORY_TRACT
  Filled 2018-08-30: qty 3

## 2018-08-30 MED ORDER — SODIUM CHLORIDE 0.9 % IV SOLN
INTRAVENOUS | Status: DC
  Administered 2018-08-30: 22:00:00 via INTRAVENOUS

## 2018-08-30 MED ORDER — BUDESONIDE 0.25 MG/2ML IN SUSP
0.2500 mg | Freq: Two times a day (BID) | RESPIRATORY_TRACT | Status: DC
  Administered 2018-08-31: 0.25 mg via RESPIRATORY_TRACT
  Filled 2018-08-30: qty 2

## 2018-08-30 MED ORDER — SODIUM CHLORIDE 0.9% IV SOLUTION
Freq: Once | INTRAVENOUS | Status: AC
  Administered 2018-08-30: via INTRAVENOUS

## 2018-08-30 MED ORDER — ACETAMINOPHEN 650 MG RE SUPP: 650.0000 mg | Freq: Four times a day (QID) | RECTAL | Status: DC | PRN

## 2018-08-30 MED ORDER — LEVETIRACETAM 500 MG PO TABS
500.0000 mg | ORAL_TABLET | Freq: Two times a day (BID) | ORAL | Status: DC
  Administered 2018-08-31: 500 mg via ORAL
  Filled 2018-08-30 (×2): qty 1

## 2018-08-30 NOTE — ED Provider Notes (Signed)
[facility] Emergency Department Provider Note ____________________________________________   First MD Initiated Contact with Patient 08/30/18 1757     (approximate)  I have reviewed the triage vital signs and the nursing notes.   HISTORY  Chief Complaint Weakness    HPI CHASEN MENDELL is a 62 y.o. male with PMH as noted below who presents with weakness, acute onset this evening while he was in a van riding home from dialysis, associated with lightheadedness and with some shortness of breath which she states is similar to his asthma symptoms.  Patient states he is feeling a little bit better now.  He states that the dialysis went normally and he was feeling fine earlier today.  He denies vomiting, headache, fever, or chest pain.  Past Medical History:  Diagnosis Date  . Arthritis    hands  . Asthma    uses inhaler  . Coronary artery disease   . Dialysis patient [facility]) 2012   Monday, Wednesday, Friday  . GERD (gastroesophageal reflux disease)   . Gout of right hand   . Hypertension   . Renal disorder    stage V..on dialysis  . Seizures ([place]) 2019   last seizure 5 years ago. had 20 in one day    Patient Active Problem List   Diagnosis Date Noted  . GIB (gastrointestinal bleeding) 08/30/2018  . Arm pain 04/29/2018  . Complication of AV dialysis fistula 10/02/2017  . Atypical chest pain 10/25/2014  . Coronary artery disease 10/25/2014  . Hypertension 10/25/2014  . ESRD (end stage renal disease) on dialysis ([place]) 10/25/2014  . Seizures ([place]) 10/25/2014    Past Surgical History:  Procedure Laterality Date  . A/V FISTULAGRAM Left 02/24/2017   Procedure: A/V Fistulagram;  Surgeon: Katha Cabal, MD;  Location: [place] [facility];  Service: Cardiovascular;  Laterality: Left;  . A/V SHUNT INTERVENTION N/A 02/24/2017   Procedure: A/V Shunt Intervention;  Surgeon: Katha Cabal, MD;  Location: [place] [facility];  Service:  Cardiovascular;  Laterality: N/A;  . AV FISTULA PLACEMENT Left   . AV FISTULA PLACEMENT Left 12/02/2017   Procedure: ARTERIOVENOUS (AV) FISTULA CREATION ( BRACHIAL CEPHALIC );  Surgeon: Katha Cabal, MD;  Location: [facility];  Service: Vascular;  Laterality: Left;  . CARDIAC CATHETERIZATION  2012   no stents  . DIALYSIS/PERMA CATHETER REMOVAL N/A 08/24/2018   Procedure: DIALYSIS/PERMA CATHETER REMOVAL;  Surgeon: Katha Cabal, MD;  Location: [place] [facility];  Service: Cardiovascular;  Laterality: N/A;  . INSERTION OF DIALYSIS CATHETER Left 05/28/2018   Procedure: INSERTION OF DIALYSIS CATHETER ( TUNNELED CATH INSERT );  Surgeon: Katha Cabal, MD;  Location: [facility];  Service: Vascular;  Laterality: Left;  . LIGATION OF ARTERIOVENOUS  FISTULA Left 12/02/2017   Procedure: LIGATION OF ARTERIOVENOUS  FISTULA ( REMOVAL LEFT WRIST FISTULA );  Surgeon: Katha Cabal, MD;  Location: [facility];  Service: Vascular;  Laterality: Left;  . PERIPHERAL VASCULAR CATHETERIZATION Left 06/03/2016   Procedure: A/V Shuntogram/Fistulagram;  Surgeon: Katha Cabal, MD;  Location: [place] [facility];  Service: Cardiovascular;  Laterality: Left;  . PERIPHERAL VASCULAR CATHETERIZATION  06/03/2016   Procedure: Peripheral Vascular Balloon Angioplasty;  Surgeon: Katha Cabal, MD;  Location: [place] [facility];  Service: Cardiovascular;;  . REVISON OF ARTERIOVENOUS FISTULA Left 05/28/2018   Procedure: REVISON OF ARTERIOVENOUS FISTULA ( REMOVE ANEURYSM );  Surgeon: Katha Cabal, MD;  Location: [facility];  Service: Vascular;  Laterality:  Left;  . UPPER EXTREMITY VENOGRAPHY N/A 04/13/2018   Procedure: UPPER EXTREMITY VENOGRAPHY;  Surgeon: Katha Cabal, MD;  Location: [place] [facility];  Service: Cardiovascular;  Laterality: N/A;    Prior to Admission medications   Medication Sig Start Date End Date Taking? Authorizing Provider  albuterol (PROVENTIL HFA;VENTOLIN HFA) 108 (90 BASE)  MCG/ACT inhaler Inhale into the lungs every 6 (six) hours as needed for wheezing or shortness of breath.   Yes [provider]  allopurinol (ZYLOPRIM) 100 MG tablet Take 200 mg by mouth daily.   Yes [provider]  aspirin EC 81 MG tablet Take 81 mg by mouth daily.   Yes [provider]  cinacalcet (SENSIPAR) 60 MG tablet Take 120 mg by mouth daily.    Yes [provider]  cyanocobalamin 1000 MCG tablet Take 1,000 mcg daily by mouth.   Yes [provider]  felodipine (PLENDIL) 2.5 MG 24 hr tablet Take 2.5 mg by mouth daily.    Yes [provider]  ibuprofen (ADVIL,MOTRIN) 200 MG tablet Take 200 mg by mouth daily as needed for headache or moderate pain.   Yes [provider]  Lacosamide (VIMPAT) 150 MG TABS Take 150 mg by mouth 2 (two) times daily. Take an 3rd dose after dialysis on M-W-F   Yes [provider]  levETIRAcetam (KEPPRA) 500 MG tablet Take 500 mg by mouth 2 (two) times daily. Take a 3rd dose of 500 mgs after dialysis only on dialysis days (M-W-F)   Yes [provider]  omeprazole (PRILOSEC) 20 MG capsule Take 20 mg by mouth daily.   Yes [provider]  sevelamer carbonate (RENVELA) 800 MG tablet Take 2,400 mg by mouth 3 (three) times daily with meals.    Yes [provider]  beclomethasone (QVAR) 80 MCG/ACT inhaler Inhale 1 puff into the lungs as needed (for shortness of breath).    [provider]  carvedilol (COREG CR) 10 MG 24 hr capsule Take 10 mg by mouth daily.    [provider]  oxyCODONE-acetaminophen (PERCOCET) 5-325 MG tablet Take 1 tablet by mouth every 4 (four) hours as needed for severe pain. Patient not taking: Reported on 08/30/2018 05/28/18 05/28/19  Schnier, Dolores Lory, MD    Allergies Codeine and Doxycycline  Family History  Problem Relation Age of Onset  . Heart disease Mother   . Kidney disease Paternal Uncle     Social History Social History     Tobacco Use  . Smoking status: Former Smoker    Types: Cigarettes  . Smokeless tobacco: Former Systems developer    Quit date: 2010  . Tobacco comment: did not inhale!!  Substance Use Topics  . Alcohol use: No  . Drug use: No    Review of Systems  Constitutional: No fever.  Positive for weakness. Eyes: No visual changes. ENT: No sore throat. Cardiovascular: Denies chest pain. Respiratory: Positive for mild shortness of breath. Gastrointestinal: No vomiting or diarrhea.  Genitourinary: Negative for dysuria.  Musculoskeletal: Negative for back pain. Skin: Negative for rash. Neurological: Negative for headache.   ____________________________________________   PHYSICAL EXAM:  VITAL SIGNS: ED Triage Vitals [08/30/18 1801]  Enc Vitals Group     BP 112/70     Pulse Rate 87     Resp 18     Temp 97.8 F (36.6 C)     Temp src      SpO2 98 %     Weight 162 lb 0.6 oz (73.5  kg)     Height      Head Circumference      Peak Flow      Pain Score 5     Pain Loc      Pain Edu?      Excl. in [place]?     Constitutional: Alert and oriented.  Relatively well appearing and in no acute distress. Eyes: Conjunctivae are normal.  Head: Atraumatic. Nose: No congestion/rhinnorhea. Mouth/Throat: Mucous membranes are slightly dry.   Neck: Normal range of motion.  Cardiovascular: Normal rate, regular rhythm. Grossly normal heart sounds.  Good peripheral circulation. Respiratory: Normal respiratory effort.  No retractions. Lungs CTAB. Gastrointestinal: Soft and nontender. No distention.  Genitourinary: No flank tenderness. Musculoskeletal: No lower extremity edema.  Extremities warm and well perfused.  Neurologic:  Normal speech and language.  Motor and sensory intact in all extremities.  Normal coordination.  No gross focal neurologic deficits are appreciated.  Skin:  Skin is warm and dry. No rash noted. Psychiatric: Mood and affect are normal. Speech and behavior are  normal.  ____________________________________________   LABS (all labs ordered are listed, but only abnormal results are displayed)  Labs Reviewed  BASIC METABOLIC PANEL - Abnormal; Notable for the following components:      Result Value   Sodium 134 (*)    Potassium 3.2 (*)    Chloride 94 (*)    Glucose, Bld 109 (*)    BUN 28 (*)    Creatinine, Ser 4.85 (*)    Calcium 8.2 (*)    GFR calc non Af Amer 12 (*)    GFR calc Af Amer 14 (*)    All other components within normal limits  CBC - Abnormal; Notable for the following components:   RBC 2.39 (*)    Hemoglobin 7.6 (*)    HCT 22.8 (*)    All other components within normal limits  TROPONIN I - Abnormal; Notable for the following components:   Troponin I 0.03 (*)    All other components within normal limits  HEMOGLOBIN - Abnormal; Notable for the following components:   Hemoglobin 7.1 (*)    All other components within normal limits  MRSA PCR SCREENING  HIV ANTIBODY (ROUTINE TESTING W REFLEX)  BASIC METABOLIC PANEL  HEMOGLOBIN  HEMOGLOBIN  CBG MONITORING, ED  TYPE AND SCREEN  PREPARE RBC (CROSSMATCH)   ____________________________________________  EKG  ED ECG REPORT I, Arta Silence, the attending physician, personally viewed and interpreted this ECG.  Date: 08/30/2018 EKG Time: 1806 Rate: 71 Rhythm: normal sinus rhythm QRS Axis: normal Intervals: normal ST/T Wave abnormalities: normal Narrative Interpretation: no evidence of acute ischemia  ____________________________________________  RADIOLOGY  CXR: No focal infiltrate or other acute findings  ____________________________________________   PROCEDURES  Procedure(s) performed: No  Procedures  Critical Care performed: No ____________________________________________   INITIAL IMPRESSION / ASSESSMENT AND PLAN / ED COURSE  Pertinent labs & imaging results that were available during my care of the patient were reviewed by me and considered in  my medical decision making (see chart for details).  62 year old male with PMH as noted above including ESRD on dialysis who presents with an episode of generalized weakness right after dialysis today when the patient was going home in a [employer].  He states he also feels somewhat short of breath but attributes this to his asthma.  He denies fevers, vomiting, headache, or focal neurologic symptoms.  I reviewed the past medical records in Epic; the patient recently had a IJ  dialysis catheter removed about a week ago, but has had no recent ED visits or hospitalizations.  On exam he is relatively well-appearing and his vital signs are normal.  The remainder of the exam is as described above.  Overall I suspect most likely vasovagal near syncope, mild hypovolemia related to the dialysis, or other relatively benign cause.  Given his age and comorbidities we will obtain basic labs, troponin, chest x-ray to further evaluate the shortness of breath, and reassess.  ----------------------------------------- 10:54 PM on 08/30/2018 -----------------------------------------  The patient was noted to be significantly anemic from his baseline.  On further history he reported dark tarry stools and DRE revealed guaiac positive dark stool.  I suspect that the anemia is the likely cause of the patient's symptoms and given that it is relatively acute he will need admission for further work-up and possible treatment.  The patient agrees with this plan.  I signed the patient out to the hospitalist Dr. Bridgett Larsson. ____________________________________________   FINAL CLINICAL IMPRESSION(S) / ED DIAGNOSES  Final diagnoses:  Anemia, unspecified type  Gastrointestinal hemorrhage, unspecified gastrointestinal hemorrhage type      NEW MEDICATIONS STARTED DURING THIS VISIT:  Current Discharge Medication List       Note:  This document was prepared using Dragon voice recognition software and may include unintentional  dictation errors.    Arta Silence, MD 08/30/18 2259

## 2018-08-30 NOTE — H&P (Addendum)
[facility] [place] NAME: Jeremy Rose    MR#:  [id number]  DATE OF BIRTH:  October 24, 1956  DATE OF ADMISSION:  08/30/2018  PRIMARY CARE PHYSICIAN: Freddy Finner, NP   REQUESTING/REFERRING PHYSICIAN: Dr. Arta Silence, MD  CHIEF COMPLAINT:   Chief Complaint  Patient presents with  . Weakness   Generalized weakness, dizziness and rectal bleeding. HISTORY OF PRESENT ILLNESS:  Jeremy Rose  is a 62 y.o. male with a known history of hypertension, ESRD on hemodialysis, CAD, asthma, seizure disorder and gout.  The patient presents the ED with above chief complaint.  He has had rectal bleeding for the past 2 days.  He also complains of dizziness, shortness of breath and generalized weakness.  He denies any other symptoms.  He is found decreased hemoglobin from 11.9 to7.6.  PAST MEDICAL HISTORY:   Past Medical History:  Diagnosis Date  . Arthritis    hands  . Asthma    uses inhaler  . Coronary artery disease   . Dialysis patient [facility]) 2012   Monday, Wednesday, Friday  . GERD (gastroesophageal reflux disease)   . Gout of right hand   . Hypertension   . Renal disorder    stage V..on dialysis  . Seizures ([place]) 2019   last seizure 5 years ago. had 20 in one day    PAST SURGICAL HISTORY:   Past Surgical History:  Procedure Laterality Date  . A/V FISTULAGRAM Left 02/24/2017   Procedure: A/V Fistulagram;  Surgeon: Katha Cabal, MD;  Location: [place] [facility];  Service: Cardiovascular;  Laterality: Left;  . A/V SHUNT INTERVENTION N/A 02/24/2017   Procedure: A/V Shunt Intervention;  Surgeon: Katha Cabal, MD;  Location: [place] [facility];  Service: Cardiovascular;  Laterality: N/A;  . AV FISTULA PLACEMENT Left   . AV FISTULA PLACEMENT Left 12/02/2017   Procedure: ARTERIOVENOUS (AV) FISTULA CREATION ( BRACHIAL CEPHALIC );  Surgeon: Katha Cabal, MD;  Location: [facility];  Service: Vascular;  Laterality:  Left;  . CARDIAC CATHETERIZATION  2012   no stents  . DIALYSIS/PERMA CATHETER REMOVAL N/A 08/24/2018   Procedure: DIALYSIS/PERMA CATHETER REMOVAL;  Surgeon: Katha Cabal, MD;  Location: [place] [facility];  Service: Cardiovascular;  Laterality: N/A;  . INSERTION OF DIALYSIS CATHETER Left 05/28/2018   Procedure: INSERTION OF DIALYSIS CATHETER ( TUNNELED CATH INSERT );  Surgeon: Katha Cabal, MD;  Location: [facility];  Service: Vascular;  Laterality: Left;  . LIGATION OF ARTERIOVENOUS  FISTULA Left 12/02/2017   Procedure: LIGATION OF ARTERIOVENOUS  FISTULA ( REMOVAL LEFT WRIST FISTULA );  Surgeon: Katha Cabal, MD;  Location: [facility];  Service: Vascular;  Laterality: Left;  . PERIPHERAL VASCULAR CATHETERIZATION Left 06/03/2016   Procedure: A/V Shuntogram/Fistulagram;  Surgeon: Katha Cabal, MD;  Location: [place] [facility];  Service: Cardiovascular;  Laterality: Left;  . PERIPHERAL VASCULAR CATHETERIZATION  06/03/2016   Procedure: Peripheral Vascular Balloon Angioplasty;  Surgeon: Katha Cabal, MD;  Location: [place] [facility];  Service: Cardiovascular;;  . REVISON OF ARTERIOVENOUS FISTULA Left 05/28/2018   Procedure: REVISON OF ARTERIOVENOUS FISTULA ( REMOVE ANEURYSM );  Surgeon: Katha Cabal, MD;  Location: [facility];  Service: Vascular;  Laterality: Left;  . UPPER EXTREMITY VENOGRAPHY N/A 04/13/2018   Procedure: UPPER EXTREMITY VENOGRAPHY;  Surgeon: Katha Cabal, MD;  Location: [place] [facility];  Service: Cardiovascular;  Laterality: N/A;    SOCIAL HISTORY:  Social History   Tobacco Use  . Smoking status: Former Smoker    Types: Cigarettes  . Smokeless tobacco: Former Systems developer    Quit date: 2010  . Tobacco comment: did not inhale!!  Substance Use Topics  . Alcohol use: No    FAMILY HISTORY:   Family History  Problem Relation Age of Onset  . Heart disease Mother   . Kidney disease Paternal Uncle     DRUG ALLERGIES:   Allergies    Allergen Reactions  . Codeine Nausea And Vomiting  . Doxycycline Nausea And Vomiting    REVIEW OF SYSTEMS:   Review of Systems  Constitutional: Positive for malaise/fatigue. Negative for chills and fever.  HENT: Negative for sore throat.   Eyes: Negative for blurred vision and double vision.  Respiratory: Positive for shortness of breath. Negative for cough, hemoptysis, wheezing and stridor.   Cardiovascular: Negative for chest pain, palpitations, orthopnea and leg swelling.  Gastrointestinal: Positive for blood in stool. Negative for abdominal pain, diarrhea, melena, nausea and vomiting.  Genitourinary: Negative for dysuria, flank pain and hematuria.  Musculoskeletal: Negative for back pain and joint pain.  Skin: Negative for rash.  Neurological: Positive for dizziness. Negative for sensory change, focal weakness, seizures, loss of consciousness, weakness and headaches.  Endo/Heme/Allergies: Negative for polydipsia.  Psychiatric/Behavioral: Negative for depression. The patient is not nervous/anxious.     MEDICATIONS AT HOME:   Prior to Admission medications   Medication Sig Start Date End Date Taking? Authorizing Provider  albuterol (PROVENTIL HFA;VENTOLIN HFA) 108 (90 BASE) MCG/ACT inhaler Inhale into the lungs every 6 (six) hours as needed for wheezing or shortness of breath.   Yes [provider]  allopurinol (ZYLOPRIM) 100 MG tablet Take 200 mg by mouth daily.   Yes [provider]  aspirin EC 81 MG tablet Take 81 mg by mouth daily.   Yes [provider]  cinacalcet (SENSIPAR) 60 MG tablet Take 120 mg by mouth daily.    Yes [provider]  cyanocobalamin 1000 MCG tablet Take 1,000 mcg daily by mouth.   Yes [provider]  felodipine (PLENDIL) 2.5 MG 24 hr tablet Take 2.5 mg by mouth daily.    Yes [provider]  ibuprofen (ADVIL,MOTRIN) 200 MG tablet Take 200 mg by mouth daily as needed for headache or moderate pain.    Yes [provider]  Lacosamide (VIMPAT) 150 MG TABS Take 150 mg by mouth 2 (two) times daily. Take an 3rd dose after dialysis on M-W-F   Yes [provider]  levETIRAcetam (KEPPRA) 500 MG tablet Take 500 mg by mouth 2 (two) times daily. Take a 3rd dose of 500 mgs after dialysis only on dialysis days (M-W-F)   Yes [provider]  omeprazole (PRILOSEC) 20 MG capsule Take 20 mg by mouth daily.   Yes [provider]  sevelamer carbonate (RENVELA) 800 MG tablet Take 2,400 mg by mouth 3 (three) times daily with meals.    Yes [provider]  beclomethasone (QVAR) 80 MCG/ACT inhaler Inhale 1 puff into the lungs as needed (for shortness of breath).    [provider]  carvedilol (COREG CR) 10 MG 24 hr capsule Take 10 mg by mouth daily.    [provider]  oxyCODONE-acetaminophen (PERCOCET) 5-325 MG tablet Take 1 tablet by mouth every 4 (four) hours as needed for severe pain. Patient not taking: Reported on 08/30/2018 05/28/18 05/28/19  Schnier, Dolores Lory, MD      VITAL SIGNS:  Blood pressure 112/70, pulse 87, temperature 97.8 F (36.6 C), resp. rate 18, weight 73.5 kg, SpO2 98 %.  PHYSICAL EXAMINATION:  Physical Exam  GENERAL:  62 y.o.-year-old patient lying in the bed with no acute distress.  EYES: Pupils equal, round, reactive to light and accommodation. No scleral icterus. Extraocular muscles intact.  HEENT: Head atraumatic, normocephalic. Oropharynx and nasopharynx clear.  NECK:  Supple, no jugular venous distention. No thyroid enlargement, no tenderness.  LUNGS: Normal breath sounds bilaterally, no wheezing, rales,rhonchi or crepitation. No use of accessory muscles of respiration.  CARDIOVASCULAR: S1, S2 normal. No murmurs, rubs, or gallops.  ABDOMEN: Soft, nontender, nondistended. Bowel sounds present. No organomegaly or mass.  EXTREMITIES: No pedal edema, cyanosis, or clubbing.  NEUROLOGIC: Cranial nerves II through XII are  intact. Muscle strength 5/5 in all extremities. Sensation intact. Gait not checked.  PSYCHIATRIC: The patient is alert and oriented x 3.  SKIN: No obvious rash, lesion, or ulcer.   LABORATORY PANEL:   CBC Recent Labs  Lab 08/30/18 1806  WBC 6.3  HGB 7.6*  HCT 22.8*  PLT 207   ------------------------------------------------------------------------------------------------------------------  Chemistries  Recent Labs  Lab 08/30/18 1806  NA 134*  K 3.2*  CL 94*  CO2 28  GLUCOSE 109*  BUN 28*  CREATININE 4.85*  CALCIUM 8.2*   ------------------------------------------------------------------------------------------------------------------  Cardiac Enzymes Recent Labs  Lab 08/30/18 1806  TROPONINI 0.03*   ------------------------------------------------------------------------------------------------------------------  RADIOLOGY:  Dg Chest Portable 1 View  Result Date: 08/30/2018 CLINICAL DATA:  Dialysis patient with acute weakness. EXAM: PORTABLE CHEST 1 VIEW COMPARISON:  10/25/2014 FINDINGS: Heart size is at the upper limits of normal. Chronic aortic atherosclerosis. There is chronic pulmonary scarring at the left base. Otherwise the lungs are clear. No edema or effusions. No significant bone finding. IMPRESSION: No active disease. Aortic atherosclerosis. Chronic left base pulmonary scarring. No edema or evidence of fluid overload. Electronically Signed   By: Nelson Chimes M.D.   On: 08/30/2018 19:00      IMPRESSION AND PLAN:   Lower GI bleeding. The patient will be admitted to medical floor. Follow-up hemoglobin, IV fluid support, n.p.o. except medication, GI consult.  Symptomatic anemia due to acute blood loss secondary to GI bleeding. PRBC transfusion, follow-up hemoglobin.  ESRD.  Nephrology consult for hemodialysis.  Hypertension.  Hold hypertension medication due to normal blood pressure and GI bleeding.  Seizure disorder.  Continue Keppra. All the  records are reviewed and case discussed with ED provider. Management plans discussed with the patient, family and they are in agreement.  CODE STATUS: Full code.  TOTAL TIME TAKING CARE OF THIS PATIENT: 38 minutes.    Demetrios Loll M.D on 08/30/2018 at 9:04 PM  Between 7am to 6pm - Pager - [phone]  After 6pm go to [email or web address] - Proofreader  [facility]  Office  [phone]  CC: Primary care physician; Freddy Finner, NP   Note: This dictation was prepared with Dragon dictation along with smaller phrase technology. Any transcriptional errors that result from this process are unin

## 2018-08-30 NOTE — ED Notes (Signed)
Patient transported to room 211 by this EDT via stretcher.

## 2018-08-30 NOTE — Progress Notes (Signed)
Advanced Care Plan.  Purpose of Encounter: CODE STATUS. Parties in Attendance: The patient in the me. Patient's Decisional Capacity: Yes. Medical Story: Jeremy Rose  is a 62 y.o. male with a known history of hypertension, ESRD on hemodialysis, CAD, asthma, seizure disorder and gout.  He is being admitted for GI bleeding and symptomatic anemia due to GI bleeding.  I discussed with patient about the patient's current condition, prognosis and CODE STATUS.  The patient want to be just resuscitated and intubated to get him back. Plan:  Code Status: Full code. Time spent discussing advance care planning: 18 minutes.

## 2018-08-30 NOTE — ED Triage Notes (Signed)
Pt was on the way home from dialysis on the bus and felt weak/fatigue. 0.9L removed. Pt A&Ox4.

## 2018-08-31 ENCOUNTER — Encounter: Payer: Self-pay | Admitting: Anesthesiology

## 2018-08-31 ENCOUNTER — Encounter
Admission: EM | Disposition: A | Discharge status: Home / Self Care | Payer: Self-pay | Source: Home / Self Care | Attending: Emergency Medicine

## 2018-08-31 ENCOUNTER — Inpatient Hospital Stay: Payer: Medicare PPO | Admitting: Anesthesiology

## 2018-08-31 DIAGNOSIS — K921 Melena: Secondary | ICD-10-CM | POA: Diagnosis not present

## 2018-08-31 DIAGNOSIS — K2900 Acute gastritis without bleeding: Secondary | ICD-10-CM | POA: Diagnosis not present

## 2018-08-31 DIAGNOSIS — K2980 Duodenitis without bleeding: Secondary | ICD-10-CM

## 2018-08-31 DIAGNOSIS — K922 Gastrointestinal hemorrhage, unspecified: Secondary | ICD-10-CM | POA: Diagnosis not present

## 2018-08-31 HISTORY — PX: ESOPHAGOGASTRODUODENOSCOPY (EGD) WITH PROPOFOL: SHX5813

## 2018-08-31 LAB — HEMOGLOBIN
HEMOGLOBIN: 7.7 g/dL — AB (ref 13.0–17.0)
Hemoglobin: 7.8 g/dL — ABNORMAL LOW (ref 13.0–17.0)

## 2018-08-31 LAB — BASIC METABOLIC PANEL
Anion gap: 12 (ref 5–15)
BUN: 43 mg/dL — AB (ref 8–23)
CO2: 27 mmol/L (ref 22–32)
CREATININE: 6.24 mg/dL — AB (ref 0.61–1.24)
Calcium: 8.5 mg/dL — ABNORMAL LOW (ref 8.9–10.3)
Chloride: 96 mmol/L — ABNORMAL LOW (ref 98–111)
GFR calc Af Amer: 10 mL/min — ABNORMAL LOW (ref >60)
GFR, EST NON AFRICAN AMERICAN: 9 mL/min — AB (ref >60)
Glucose, Bld: 88 mg/dL (ref 70–99)
Potassium: 3.8 mmol/L (ref 3.5–5.1)
SODIUM: 135 mmol/L (ref 135–145)

## 2018-08-31 SURGERY — ESOPHAGOGASTRODUODENOSCOPY (EGD) WITH PROPOFOL
Anesthesia: General

## 2018-08-31 MED ORDER — PANTOPRAZOLE SODIUM 40 MG PO TBEC: 40.0000 mg | DELAYED_RELEASE_TABLET | Freq: Two times a day (BID) | ORAL | 0 refills | Status: DC

## 2018-08-31 MED ORDER — PROPOFOL 10 MG/ML IV BOLUS
INTRAVENOUS | Status: DC | PRN
  Administered 2018-08-31: 70 mg via INTRAVENOUS

## 2018-08-31 MED ORDER — ACETAMINOPHEN 500 MG PO TABS: 500.0000 mg | ORAL_TABLET | Freq: Four times a day (QID) | ORAL | 0 refills | Status: AC | PRN

## 2018-08-31 MED ORDER — LIDOCAINE 2% (20 MG/ML) 5 ML SYRINGE
INTRAMUSCULAR | Status: DC | PRN
  Administered 2018-08-31: 100 mg via INTRAVENOUS

## 2018-08-31 MED ORDER — PROPOFOL 500 MG/50ML IV EMUL
INTRAVENOUS | Status: DC | PRN
  Administered 2018-08-31: 200 ug/kg/min via INTRAVENOUS

## 2018-08-31 NOTE — Op Note (Signed)
[facility] Patient Name: Jeremy Rose Procedure Date: 08/31/2018 12:18 PM MRN: [id number] Account #: [id number] Date of Birth: 10-03-1956 Admit Type: Inpatient Age: 62 Room: [facility] ENDO ROOM 4 Gender: Male Note Status: Finalized Procedure:            Upper GI endoscopy Indications:          Melena Providers:            Lucilla Lame MD, MD Referring MD:         Neomia Dear (Referring MD) Medicines:            Propofol per Anesthesia Complications:        No immediate complications. Procedure:            Pre-Anesthesia Assessment:                       - Prior to the procedure, a History and Physical was                        performed, and patient medications and allergies were                        reviewed. The patient's tolerance of previous                        anesthesia was also reviewed. The risks and benefits of                        the procedure and the sedation options and risks were                        discussed with the patient. All questions were                        answered, and informed consent was obtained. Prior                        Anticoagulants: The patient has taken no previous                        anticoagulant or antiplatelet agents. ASA Grade                        Assessment: II - A patient with mild systemic disease.                        After reviewing the risks and benefits, the patient was                        deemed in satisfactory condition to undergo the                        procedure.                       After obtaining informed consent, the endoscope was                        passed under direct vision. Throughout the procedure,  the patient's blood pressure, pulse, and oxygen                        saturations were monitored continuously. The Endoscope                        was introduced through the mouth, and advanced to the                        second  part of duodenum. The upper GI endoscopy was                        accomplished without difficulty. The patient tolerated                        the procedure well. Findings:      A small hiatal hernia was present.      Localized mild inflammation characterized by erythema was found in the       gastric antrum.      Localized moderate inflammation characterized by erythema was found in       the duodenal bulb. Impression:           - Small hiatal hernia.                       - Gastritis.                       - Duodenitis.                       - No specimens collected. Recommendation:       - Return patient to hospital ward for ongoing care.                       - Resume previous diet.                       - Continue present medications.                       - Use a proton pump inhibitor PO daily. Procedure Code(s):    --- Professional ---                       [phone], Esophagogastroduodenoscopy, flexible, transoral;                        diagnostic, including collection of specimen(s) by                        brushing or washing, when performed (separate procedure) Diagnosis Code(s):    --- Professional ---                       K92.1, Melena (includes Hematochezia)                       K29.80, Duodenitis without bleeding                       K29.70, Gastritis, unspecified, without bleeding CPT copyright 2018 American Medical Association. All rights reserved. The codes documented in this report are preliminary and upon  coder review may  be revised to meet current compliance requirements. Lucilla Lame MD, MD 08/31/2018 12:29:09 PM This report has been signed electronically. Number of Addenda: 0 Note Initiated On: 08/31/2018 12:18 PM      [facility]

## 2018-08-31 NOTE — Care Management (Signed)
Amanda Morris HD liaison notified of admission and discharge 

## 2018-08-31 NOTE — Progress Notes (Signed)
Patient discharge teaching given, including activity, diet, follow-up appoints, and medications. Patient verbalized understanding of all discharge instructions. IV access was d/c'd. Vitals are stable. Skin is intact except as charted in most recent assessments. Pt to be escorted out by volunteer, to be driven home by family.  Kenaz Olafson  

## 2018-08-31 NOTE — Anesthesia Post-op Follow-up Note (Signed)
Anesthesia QCDR form completed.        

## 2018-08-31 NOTE — Discharge Summary (Signed)
[place] [facility] [place] NAME: Jeremy Rose    MR#:  [id number]  DATE OF BIRTH:  04-Nov-1956  DATE OF ADMISSION:  08/30/2018 ADMITTING PHYSICIAN: Demetrios Loll, MD  DATE OF DISCHARGE: No discharge date for patient encounter.  PRIMARY CARE PHYSICIAN: Freddy Finner, NP    ADMISSION DIAGNOSIS:  Gastrointestinal hemorrhage, unspecified gastrointestinal hemorrhage type [K92.2] Anemia, unspecified type [D64.9]  DISCHARGE DIAGNOSIS:  Active Problems:   GIB (gastrointestinal bleeding)   Blood in stool   Duodenitis   Acute gastritis without hemorrhage   SECONDARY DIAGNOSIS:   Past Medical History:  Diagnosis Date  . Arthritis    hands  . Asthma    uses inhaler  . Coronary artery disease   . Dialysis patient [facility]) 2012   Monday, Wednesday, Friday  . GERD (gastroesophageal reflux disease)   . Gout of right hand   . Hypertension   . Renal disorder    stage V..on dialysis  . Seizures ([place]) 2019   last seizure 5 years ago. had 32 in one day    HOSPITAL COURSE:  *Lower GI bleeding Resolved Status post EGD-noted for gastritis/duodenitis, gastroenterology did see patient while in house-recommended PPI, patient to avoid NSAID medications going forward  *Acute Symptomatic anemia  Resolved status post blood transfusion due to acute blood loss secondary to GI bleeding.  *Chronic ESRD To resume outpatient hemodialysis on tomorrow Nephrology was consulted while in house   *Chronic benign essential hypertension  Held antihypertensive medication while in house   *Chronic seizure disorder  Stable on Keppra   DISCHARGE CONDITIONS:   stable  CONSULTS OBTAINED:  Treatment Team:  Lucilla Lame, MD Murlean Iba, MD  DRUG ALLERGIES:   Allergies  Allergen Reactions  . Codeine Nausea And Vomiting  . Doxycycline Nausea And Vomiting    DISCHARGE MEDICATIONS:   Allergies as of 08/31/2018      Reactions   Codeine Nausea  And Vomiting   Doxycycline Nausea And Vomiting      Medication List    STOP taking these medications   ibuprofen 200 MG tablet Commonly known as:  ADVIL,MOTRIN   omeprazole 20 MG capsule Commonly known as:  PRILOSEC     TAKE these medications   acetaminophen 500 MG tablet Commonly known as:  TYLENOL Take 1 tablet (500 mg total) by mouth every 6 (six) hours as needed for mild pain.   albuterol 108 (90 Base) MCG/ACT inhaler Commonly known as:  PROVENTIL HFA;VENTOLIN HFA Inhale into the lungs every 6 (six) hours as needed for wheezing or shortness of breath.   allopurinol 100 MG tablet Commonly known as:  ZYLOPRIM Take 200 mg by mouth daily.   aspirin EC 81 MG tablet Take 81 mg by mouth daily.   beclomethasone 80 MCG/ACT inhaler Commonly known as:  QVAR Inhale 1 puff into the lungs as needed (for shortness of breath).   carvedilol 10 MG 24 hr capsule Commonly known as:  COREG CR Take 10 mg by mouth daily.   cinacalcet 60 MG tablet Commonly known as:  SENSIPAR Take 120 mg by mouth daily.   cyanocobalamin 1000 MCG tablet Take 1,000 mcg daily by mouth.   felodipine 2.5 MG 24 hr tablet Commonly known as:  PLENDIL Take 2.5 mg by mouth daily.   levETIRAcetam 500 MG tablet Commonly known as:  KEPPRA Take 500 mg by mouth 2 (two) times daily. Take a 3rd dose of 500 mgs after dialysis only on dialysis days (M-W-F)  oxyCODONE-acetaminophen 5-325 MG tablet Commonly known as:  PERCOCET/ROXICET Take 1 tablet by mouth every 4 (four) hours as needed for severe pain.   pantoprazole 40 MG tablet Commonly known as:  PROTONIX Take 1 tablet (40 mg total) by mouth 2 (two) times daily before a meal.   sevelamer carbonate 800 MG tablet Commonly known as:  RENVELA Take 2,400 mg by mouth 3 (three) times daily with meals.   VIMPAT 150 MG Tabs Generic drug:  Lacosamide Take 150 mg by mouth 2 (two) times daily. Take an 3rd dose after dialysis on M-W-F        DISCHARGE  INSTRUCTIONS:  If you experience worsening of your admission symptoms, develop shortness of breath, life threatening emergency, suicidal or homicidal thoughts you must seek medical attention immediately by calling 911 or calling your MD immediately  if symptoms less severe.  You Must read complete instructions/literature along with all the possible adverse reactions/side effects for all the Medicines you take and that have been prescribed to you. Take any new Medicines after you have completely understood and accept all the possible adverse reactions/side effects.   Please note  You were cared for by a hospitalist during your hospital stay. If you have any questions about your discharge medications or the care you received while you were in the hospital after you are discharged, you can call the unit and asked to speak with the hospitalist on call if the hospitalist that took care of you is not available. Once you are discharged, your primary care physician will handle any further medical issues. Please note that NO REFILLS for any discharge medications will be authorized once you are discharged, as it is imperative that you return to your primary care physician (or establish a relationship with a primary care physician if you do not have one) for your aftercare needs so that they can reassess your need for medications and monitor your lab values.    Today   CHIEF COMPLAINT:   Chief Complaint  Patient presents with  . Weakness    HISTORY OF PRESENT ILLNESS:  62 y.o. male with a known history of hypertension, ESRD on hemodialysis, CAD, asthma, seizure disorder and gout.  The patient presents the ED with above chief complaint.  He has had rectal bleeding for the past 2 days.  He also complains of dizziness, shortness of breath and generalized weakness.  He denies any other symptoms.  He is found decreased hemoglobin from 11.9 to7.6.   VITAL SIGNS:  Blood pressure 105/60, pulse 75, temperature  (!) 97 F (36.1 C), temperature source Tympanic, resp. rate 14, height 5\' 8"  (1.727 m), weight 72.4 kg, SpO2 96 %.  I/O:    Intake/Output Summary (Last 24 hours) at 08/31/2018 1258 Last data filed at 08/31/2018 1117 Gross per 24 hour  Intake 1192.07 ml  Output -  Net 1192.07 ml    PHYSICAL EXAMINATION:  GENERAL:  62 y.o.-year-old patient lying in the bed with no acute distress.  EYES: Pupils equal, round, reactive to light and accommodation. No scleral icterus. Extraocular muscles intact.  HEENT: Head atraumatic, normocephalic. Oropharynx and nasopharynx clear.  NECK:  Supple, no jugular venous distention. No thyroid enlargement, no tenderness.  LUNGS: Normal breath sounds bilaterally, no wheezing, rales,rhonchi or crepitation. No use of accessory muscles of respiration.  CARDIOVASCULAR: S1, S2 normal. No murmurs, rubs, or gallops.  ABDOMEN: Soft, non-tender, non-distended. Bowel sounds present. No organomegaly or mass.  EXTREMITIES: No pedal edema, cyanosis, or clubbing.  NEUROLOGIC:  Cranial nerves II through XII are intact. Muscle strength 5/5 in all extremities. Sensation intact. Gait not checked.  PSYCHIATRIC: The patient is alert and oriented x 3.  SKIN: No obvious rash, lesion, or ulcer.   DATA REVIEW:   CBC Recent Labs  Lab 08/30/18 1806  08/31/18 0900  WBC 6.3  --   --   HGB 7.6*   < > 7.8*  HCT 22.8*  --   --   PLT 207  --   --    < > = values in this interval not displayed.    Chemistries  Recent Labs  Lab 08/31/18 0404  NA 135  K 3.8  CL 96*  CO2 27  GLUCOSE 88  BUN 43*  CREATININE 6.24*  CALCIUM 8.5*    Cardiac Enzymes Recent Labs  Lab 08/30/18 1806  TROPONINI 0.03*    Microbiology Results  Results for orders placed or performed during the hospital encounter of 08/30/18  MRSA PCR Screening     Status: None   Collection Time: 08/30/18  9:15 PM  Result Value Ref Range Status   MRSA by PCR NEGATIVE NEGATIVE Final    Comment:        The  GeneXpert MRSA Assay (FDA approved for NASAL specimens only), is one component of a comprehensive MRSA colonization surveillance program. It is not intended to diagnose MRSA infection nor to guide or monitor treatment for MRSA infections. Performed at [facility] [address] [place], [place] [address]     RADIOLOGY:  Dg Chest Portable 1 View  Result Date: 08/30/2018 CLINICAL DATA:  Dialysis patient with acute weakness. EXAM: PORTABLE CHEST 1 VIEW COMPARISON:  10/25/2014 FINDINGS: Heart size is at the upper limits of normal. Chronic aortic atherosclerosis. There is chronic pulmonary scarring at the left base. Otherwise the lungs are clear. No edema or effusions. No significant bone finding. IMPRESSION: No active disease. Aortic atherosclerosis. Chronic left base pulmonary scarring. No edema or evidence of fluid overload. Electronically Signed   By: Nelson Chimes M.D.   On: 08/30/2018 19:00    EKG:   Orders placed or performed during the hospital encounter of 08/30/18  . ED EKG  . ED EKG  . EKG 12-Lead  . EKG 12-Lead      Management plans discussed with the patient, family and they are in agreement.  CODE STATUS:     Code Status Orders  (From admission, onward)         Start     Ordered   08/30/18 2105  Full code  Continuous     08/30/18 2104        Code Status History    Date Active Date Inactive Code Status Order ID Comments User Context   10/25/2014 2358 10/26/2014 1328 Full Code [id number]  Theressa Millard, MD ED    Advance Directive Documentation     Most Recent Value  Type of Advance Directive  Healthcare Power of [place], Living will  Pre-existing out of facility DNR order (yellow form or pink MOST form)  -  "MOST" Form in Place?  -      TOTAL TIME TAKING CARE OF THIS PATIENT: 40 minutes.    Avel Peace Tameah Mihalko M.D on 08/31/2018 at 12:58 PM  Between 7am to 6pm - Pager - [phone]  After 6pm go to [email or web address] - password [email or web address]  [facility]  Office  [phone]  CC: Primary care physician; Freddy Finner, NP   Note: This  dictation was prepared with Dragon dictation along with smaller phrase technology. Any transcriptional errors that result from this process are unintentional.

## 2018-08-31 NOTE — Care Management Obs Status (Signed)
[place] NOTIFICATION   Patient Details  Name: Jeremy Rose MRN: [id number] Date of Birth: 05-29-1956   Medicare Observation Status Notification Given:  Yes    Beverly Sessions, RN 08/31/2018, 1:41 PM

## 2018-08-31 NOTE — Anesthesia Preprocedure Evaluation (Signed)
Anesthesia Evaluation  Patient identified by MRN, date of birth, ID band Patient awake    Reviewed: Allergy & Precautions, H&P , NPO status , Patient's Chart, lab work & pertinent test results  History of Anesthesia Complications Negative for: history of anesthetic complications  Airway Mallampati: III  TM Distance: >3 FB Neck ROM: full    Dental  (+) Edentulous Upper, Edentulous Lower   Pulmonary neg shortness of breath, asthma , former smoker,           Cardiovascular Exercise Tolerance: Good hypertension, (-) angina+ CAD  (-) Past MI and (-) DOE      Neuro/Psych Seizures -, Well Controlled,  negative psych ROS   GI/Hepatic Neg liver ROS, GERD  Medicated and Controlled,  Endo/Other  negative endocrine ROS  Renal/GU DialysisRenal disease  negative genitourinary   Musculoskeletal  (+) Arthritis ,   Abdominal   Peds  Hematology negative hematology ROS (+)   Anesthesia Other Findings Past Medical History: No date: Arthritis     Comment:  hands No date: Asthma     Comment:  uses inhaler No date: Coronary artery disease 2012: Dialysis patient [facility])     Comment:  Monday, Wednesday, Friday No date: GERD (gastroesophageal reflux disease) No date: Gout of right hand No date: Hypertension No date: Renal disorder     Comment:  stage V..on dialysis 2019: Seizures ([place])     Comment:  last seizure 5 years ago. had 20 in one day  Past Surgical History: 02/24/2017: A/V FISTULAGRAM; Left     Comment:  Procedure: A/V Fistulagram;  Surgeon: Katha Cabal,              MD;  Location: [place] [facility];  Service:               Cardiovascular;  Laterality: Left; 02/24/2017: A/V SHUNT INTERVENTION; N/A     Comment:  Procedure: A/V Shunt Intervention;  Surgeon: Katha Cabal, MD;  Location: [place] [facility];  Service:               Cardiovascular;  Laterality: N/A; No date: AV FISTULA PLACEMENT;  Left 12/02/2017: AV FISTULA PLACEMENT; Left     Comment:  Procedure: ARTERIOVENOUS (AV) FISTULA CREATION (               BRACHIAL CEPHALIC );  Surgeon: Katha Cabal, MD;                Location: [facility] ORS;  Service: Vascular;  Laterality:               Left; 2012: CARDIAC CATHETERIZATION     Comment:  no stents 08/24/2018: DIALYSIS/PERMA CATHETER REMOVAL; N/A     Comment:  Procedure: DIALYSIS/PERMA CATHETER REMOVAL;  Surgeon:               Katha Cabal, MD;  Location: [place] [facility];               Service: Cardiovascular;  Laterality: N/A; 05/28/2018: INSERTION OF DIALYSIS CATHETER; Left     Comment:  Procedure: INSERTION OF DIALYSIS CATHETER ( TUNNELED               CATH INSERT );  Surgeon: Katha Cabal, MD;                Location: [facility] ORS;  Service: Vascular;  Laterality:  Left; 12/02/2017: LIGATION OF ARTERIOVENOUS  FISTULA; Left     Comment:  Procedure: LIGATION OF ARTERIOVENOUS  FISTULA ( REMOVAL               LEFT WRIST FISTULA );  Surgeon: Katha Cabal, MD;                Location: [facility] ORS;  Service: Vascular;  Laterality:               Left; 06/03/2016: PERIPHERAL VASCULAR CATHETERIZATION; Left     Comment:  Procedure: A/V Shuntogram/Fistulagram;  Surgeon: Katha Cabal, MD;  Location: [place] [facility];  Service:              Cardiovascular;  Laterality: Left; 06/03/2016: PERIPHERAL VASCULAR CATHETERIZATION     Comment:  Procedure: Peripheral Vascular Balloon Angioplasty;                Surgeon: Katha Cabal, MD;  Location: [place]               CV LAB;  Service: Cardiovascular;; 05/28/2018: REVISON OF ARTERIOVENOUS FISTULA; Left     Comment:  Procedure: REVISON OF ARTERIOVENOUS FISTULA ( REMOVE               ANEURYSM );  Surgeon: Katha Cabal, MD;  Location:               [facility] ORS;  Service: Vascular;  Laterality: Left; 04/13/2018: UPPER EXTREMITY VENOGRAPHY; N/A     Comment:  Procedure: UPPER  EXTREMITY VENOGRAPHY;  Surgeon:               Katha Cabal, MD;  Location: [place] [facility];               Service: Cardiovascular;  Laterality: N/A;  BMI    Body Mass Index:  24.27 kg/m      Reproductive/Obstetrics negative OB ROS                             Anesthesia Physical Anesthesia Plan  ASA: IV  Anesthesia Plan: General   Post-op Pain Management:    Induction: Intravenous  PONV Risk Score and Plan: Propofol infusion and TIVA  Airway Management Planned: Natural Airway and Nasal Cannula  Additional Equipment:   Intra-op Plan:   Post-operative Plan:   Informed Consent: I have reviewed the patients History and Physical, chart, labs and discussed the procedure including the risks, benefits and alternatives for the proposed anesthesia with the patient or authorized representative who has indicated his/her understanding and acceptance.   Dental Advisory Given  Plan Discussed with: Anesthesiologist, CRNA and Surgeon  Anesthesia Plan Comments: (Patient consented for risks of anesthesia including but not limited to:  - adverse reactions to medications - risk of intubation if required - damage to teeth, lips or other oral mucosa - sore throat or hoarseness - Damage to heart, brain, lungs or loss of life  Patient voiced understanding.)        Anesthesia Quick Evaluation

## 2018-08-31 NOTE — Anesthesia Postprocedure Evaluation (Signed)
Anesthesia Post Note  Patient: HAO DION  Procedure(s) Performed: ESOPHAGOGASTRODUODENOSCOPY (EGD) WITH PROPOFOL (N/A )  Patient location during evaluation: Endoscopy Anesthesia Type: General Level of consciousness: awake and alert Pain management: pain level controlled Vital Signs Assessment: post-procedure vital signs reviewed and stable Respiratory status: spontaneous breathing, nonlabored ventilation, respiratory function stable and patient connected to nasal cannula oxygen Cardiovascular status: blood pressure returned to baseline and stable Postop Assessment: no apparent nausea or vomiting Anesthetic complications: no     Last Vitals:  Vitals:   08/31/18 1302 08/31/18 1326  BP: 133/67 124/75  Pulse: 72 65  Resp: (!) 21 12  Temp:  (!) 36.4 C  SpO2: 100% 100%    Last Pain:  Vitals:   08/31/18 1326  TempSrc: Oral  PainSc:                  Precious Haws Cristina Mattern

## 2018-08-31 NOTE — Care Management CC44 (Signed)
Condition Code 44 Documentation Completed  Patient Details  Name: Jeremy Rose MRN: [id number] Date of Birth: 06/21/1956   Condition Code 44 given:  Yes Patient signature on Condition Code 44 notice:  Yes Documentation of 2 MD's agreement:  Yes Code 44 added to claim:  Yes    Beverly Sessions, RN 08/31/2018, 1:41 PM

## 2018-08-31 NOTE — Transfer of Care (Signed)
Immediate Anesthesia Transfer of Care Note  Patient: Jeremy Rose  Procedure(s) Performed: ESOPHAGOGASTRODUODENOSCOPY (EGD) WITH PROPOFOL (N/A )  Patient Location: Endoscopy Unit  Anesthesia Type:General  Level of Consciousness: sedated  Airway & Oxygen Therapy: Patient connected to nasal cannula oxygen  Post-op Assessment: Post -op Vital signs reviewed and stable  Post vital signs: stable  Last Vitals:  Vitals Value Taken Time  BP    Temp 36.1 C 08/31/2018 12:31 PM  Pulse    Resp    SpO2      Last Pain:  Vitals:   08/31/18 1231  TempSrc: Tympanic  PainSc:          Complications: No apparent anesthesia complications

## 2018-09-01 ENCOUNTER — Encounter: Payer: Self-pay | Admitting: Emergency Medicine

## 2018-09-01 ENCOUNTER — Inpatient Hospital Stay
Admission: EM | Admit: 2018-09-01 | Discharge: 2018-09-08 | DRG: 377 | Disposition: A | Discharge status: Other Acute Inpatient Hospital | Payer: Medicare PPO | Attending: Internal Medicine | Admitting: Internal Medicine

## 2018-09-01 ENCOUNTER — Other Ambulatory Visit: Payer: Self-pay

## 2018-09-01 DIAGNOSIS — R55 Syncope and collapse: Secondary | ICD-10-CM

## 2018-09-01 DIAGNOSIS — K922 Gastrointestinal hemorrhage, unspecified: Secondary | ICD-10-CM | POA: Diagnosis not present

## 2018-09-01 DIAGNOSIS — R112 Nausea with vomiting, unspecified: Secondary | ICD-10-CM

## 2018-09-01 DIAGNOSIS — D649 Anemia, unspecified: Secondary | ICD-10-CM | POA: Diagnosis present

## 2018-09-01 LAB — LIPASE, BLOOD: LIPASE: 39 U/L (ref 11–51)

## 2018-09-01 LAB — TYPE AND SCREEN
ABO/RH(D): O NEG
Antibody Screen: NEGATIVE
UNIT DIVISION: 0
Unit division: 0
Unit division: 0

## 2018-09-01 LAB — CBC WITH DIFFERENTIAL/PLATELET
Abs Immature Granulocytes: 0.04 10*3/uL (ref 0.00–0.07)
BASOS ABS: 0 10*3/uL (ref 0.0–0.1)
BASOS PCT: 1 %
EOS PCT: 1 %
Eosinophils Absolute: 0.1 10*3/uL (ref 0.0–0.5)
HCT: 21.1 % — ABNORMAL LOW (ref 39.0–52.0)
Hemoglobin: 7.2 g/dL — ABNORMAL LOW (ref 13.0–17.0)
IMMATURE GRANULOCYTES: 1 %
Lymphocytes Relative: 14 %
Lymphs Abs: 0.8 10*3/uL (ref 0.7–4.0)
MCH: 31.9 pg (ref 26.0–34.0)
MCHC: 34.1 g/dL (ref 30.0–36.0)
MCV: 93.4 fL (ref 80.0–100.0)
MONOS PCT: 10 %
Monocytes Absolute: 0.6 10*3/uL (ref 0.1–1.0)
NEUTROS PCT: 73 %
NRBC: 0 % (ref 0.0–0.2)
Neutro Abs: 4.5 10*3/uL (ref 1.7–7.7)
PLATELETS: 184 10*3/uL (ref 150–400)
RBC: 2.26 MIL/uL — ABNORMAL LOW (ref 4.22–5.81)
RDW: 14.6 % (ref 11.5–15.5)
WBC: 6.1 10*3/uL (ref 4.0–10.5)

## 2018-09-01 LAB — BASIC METABOLIC PANEL
Anion gap: 17 — ABNORMAL HIGH (ref 5–15)
BUN: 45 mg/dL — AB (ref 8–23)
CALCIUM: 8 mg/dL — AB (ref 8.9–10.3)
CO2: 26 mmol/L (ref 22–32)
CREATININE: 5.01 mg/dL — AB (ref 0.61–1.24)
Chloride: 94 mmol/L — ABNORMAL LOW (ref 98–111)
GFR, EST AFRICAN AMERICAN: 13 mL/min — AB (ref >60)
GFR, EST NON AFRICAN AMERICAN: 11 mL/min — AB (ref >60)
Glucose, Bld: 122 mg/dL — ABNORMAL HIGH (ref 70–99)
Potassium: 3.4 mmol/L — ABNORMAL LOW (ref 3.5–5.1)
SODIUM: 137 mmol/L (ref 135–145)

## 2018-09-01 LAB — PREPARE RBC (CROSSMATCH)

## 2018-09-01 LAB — BPAM RBC
BLOOD PRODUCT EXPIRATION DATE: 201911052359
Blood Product Expiration Date: 201911052359
Blood Product Expiration Date: 201911052359
ISSUE DATE / TIME: 201910142346
UNIT TYPE AND RH: 9500
Unit Type and Rh: 9500
Unit Type and Rh: 9500

## 2018-09-01 LAB — HEPATIC FUNCTION PANEL
ALBUMIN: 3.5 g/dL (ref 3.5–5.0)
ALT: 10 U/L (ref 0–44)
AST: 13 U/L — ABNORMAL LOW (ref 15–41)
Alkaline Phosphatase: 87 U/L (ref 38–126)
Bilirubin, Direct: <0.1 mg/dL (ref 0.0–0.2)
TOTAL PROTEIN: 6.3 g/dL — AB (ref 6.5–8.1)
Total Bilirubin: 0.5 mg/dL (ref 0.3–1.2)

## 2018-09-01 LAB — HIV ANTIBODY (ROUTINE TESTING W REFLEX): HIV SCREEN 4TH GENERATION: NONREACTIVE

## 2018-09-01 MED ORDER — ONDANSETRON HCL 4 MG/2ML IJ SOLN: 4.0000 mg | Freq: Four times a day (QID) | INTRAMUSCULAR | Status: DC | PRN

## 2018-09-01 MED ORDER — ALBUTEROL SULFATE (2.5 MG/3ML) 0.083% IN NEBU: 2.5000 mg | INHALATION_SOLUTION | RESPIRATORY_TRACT | Status: DC | PRN

## 2018-09-01 MED ORDER — BISACODYL 5 MG PO TBEC: 5.0000 mg | DELAYED_RELEASE_TABLET | Freq: Every day | ORAL | Status: DC | PRN

## 2018-09-01 MED ORDER — SODIUM CHLORIDE 0.9 % IV SOLN: 10.0000 mL/h | Freq: Once | INTRAVENOUS | Status: DC

## 2018-09-01 MED ORDER — LACOSAMIDE 50 MG PO TABS
150.0000 mg | ORAL_TABLET | Freq: Two times a day (BID) | ORAL | Status: DC
  Administered 2018-09-01 – 2018-09-08 (×13): 150 mg via ORAL
  Filled 2018-09-01 (×14): qty 3

## 2018-09-01 MED ORDER — CINACALCET HCL 30 MG PO TABS
120.0000 mg | ORAL_TABLET | Freq: Every day | ORAL | Status: DC
  Administered 2018-09-02 – 2018-09-06 (×3): 120 mg via ORAL
  Filled 2018-09-01 (×8): qty 4

## 2018-09-01 MED ORDER — LEVETIRACETAM 500 MG PO TABS
500.0000 mg | ORAL_TABLET | Freq: Two times a day (BID) | ORAL | Status: DC
  Administered 2018-09-01 – 2018-09-08 (×13): 500 mg via ORAL
  Filled 2018-09-01 (×13): qty 1

## 2018-09-01 MED ORDER — ONDANSETRON HCL 4 MG PO TABS: 4.0000 mg | ORAL_TABLET | Freq: Four times a day (QID) | ORAL | Status: DC | PRN

## 2018-09-01 MED ORDER — SODIUM CHLORIDE 0.9 % IV SOLN: 250.0000 mL | INTRAVENOUS | Status: DC | PRN

## 2018-09-01 MED ORDER — LACOSAMIDE 50 MG PO TABS
150.0000 mg | ORAL_TABLET | ORAL | Status: DC
  Administered 2018-09-01 – 2018-09-08 (×3): 150 mg via ORAL
  Filled 2018-09-01 (×2): qty 3

## 2018-09-01 MED ORDER — SENNOSIDES-DOCUSATE SODIUM 8.6-50 MG PO TABS: 1.0000 | ORAL_TABLET | Freq: Every evening | ORAL | Status: DC | PRN

## 2018-09-01 MED ORDER — ACETAMINOPHEN 650 MG RE SUPP: 650.0000 mg | Freq: Four times a day (QID) | RECTAL | Status: DC | PRN

## 2018-09-01 MED ORDER — BUDESONIDE 0.25 MG/2ML IN SUSP: 0.2500 mg | RESPIRATORY_TRACT | Status: DC | PRN

## 2018-09-01 MED ORDER — BECLOMETHASONE DIPROPIONATE 80 MCG/ACT IN AERS: 1.0000 | INHALATION_SPRAY | RESPIRATORY_TRACT | Status: DC | PRN

## 2018-09-01 MED ORDER — VITAMIN B-12 1000 MCG PO TABS
1000.0000 ug | ORAL_TABLET | Freq: Every day | ORAL | Status: DC
  Administered 2018-09-02 – 2018-09-08 (×5): 1000 ug via ORAL
  Filled 2018-09-01 (×5): qty 1

## 2018-09-01 MED ORDER — SEVELAMER CARBONATE 800 MG PO TABS
2400.0000 mg | ORAL_TABLET | Freq: Three times a day (TID) | ORAL | Status: DC
  Administered 2018-09-02 – 2018-09-07 (×9): 2400 mg via ORAL
  Filled 2018-09-01 (×9): qty 3

## 2018-09-01 MED ORDER — ACETAMINOPHEN 325 MG PO TABS
650.0000 mg | ORAL_TABLET | Freq: Four times a day (QID) | ORAL | Status: DC | PRN
  Administered 2018-09-04 – 2018-09-05 (×4): 650 mg via ORAL
  Filled 2018-09-01 (×4): qty 2

## 2018-09-01 MED ORDER — SODIUM CHLORIDE 0.9% FLUSH: 3.0000 mL | INTRAVENOUS | Status: DC | PRN

## 2018-09-01 MED ORDER — ONDANSETRON HCL 4 MG/2ML IJ SOLN
4.0000 mg | Freq: Once | INTRAMUSCULAR | Status: AC
  Administered 2018-09-01: 4 mg via INTRAVENOUS
  Filled 2018-09-01: qty 2

## 2018-09-01 MED ORDER — PANTOPRAZOLE SODIUM 40 MG PO TBEC
40.0000 mg | DELAYED_RELEASE_TABLET | Freq: Two times a day (BID) | ORAL | Status: DC
  Administered 2018-09-01 – 2018-09-02 (×2): 40 mg via ORAL
  Filled 2018-09-01 (×2): qty 1

## 2018-09-01 MED ORDER — SODIUM CHLORIDE 0.9% FLUSH
3.0000 mL | Freq: Two times a day (BID) | INTRAVENOUS | Status: DC
  Administered 2018-09-01 – 2018-09-08 (×12): 3 mL via INTRAVENOUS

## 2018-09-01 MED ORDER — ALLOPURINOL 100 MG PO TABS
200.0000 mg | ORAL_TABLET | Freq: Every day | ORAL | Status: DC
  Administered 2018-09-02 – 2018-09-08 (×6): 200 mg via ORAL
  Filled 2018-09-01 (×6): qty 2

## 2018-09-01 MED ORDER — LEVETIRACETAM 500 MG PO TABS
500.0000 mg | ORAL_TABLET | ORAL | Status: DC
  Administered 2018-09-06 – 2018-09-08 (×2): 500 mg via ORAL
  Filled 2018-09-01 (×2): qty 1

## 2018-09-01 NOTE — ED Provider Notes (Signed)
[facility] Emergency Department Provider Note ____________________________________________   First MD Initiated Contact with Patient 09/01/18 1511     (approximate)  I have reviewed the triage vital signs and the nursing notes.   HISTORY  Chief Complaint Nausea and Emesis    HPI Jeremy Rose is a 62 y.o. male with PMH as noted below who presents with nausea and vomiting, acute onset while the patient was getting dialysis, and now improved.  The patient reports some resolved abdominal discomfort.  He states he felt lightheaded and like he might pass out but did not pass out.  The patient was just seen in the ED and admitted 2 days ago for an episode of near syncope when he was found to be significantly anemic.  He states he took the stomach medication that he was prescribed during admission last night and this morning and felt fine prior to dialysis.   Past Medical History:  Diagnosis Date  . Arthritis    hands  . Asthma    uses inhaler  . Coronary artery disease   . Dialysis patient [facility]) 2012   Monday, Wednesday, Friday  . GERD (gastroesophageal reflux disease)   . Gout of right hand   . Hypertension   . Renal disorder    stage V..on dialysis  . Seizures ([place]) 2019   last seizure 5 years ago. had 20 in one day    Patient Active Problem List   Diagnosis Date Noted  . Blood in stool   . Duodenitis   . Acute gastritis without hemorrhage   . GIB (gastrointestinal bleeding) 08/30/2018  . Arm pain 04/29/2018  . Complication of AV dialysis fistula 10/02/2017  . Atypical chest pain 10/25/2014  . Coronary artery disease 10/25/2014  . Hypertension 10/25/2014  . ESRD (end stage renal disease) on dialysis ([place]) 10/25/2014  . Seizures ([place]) 10/25/2014    Past Surgical History:  Procedure Laterality Date  . A/V FISTULAGRAM Left 02/24/2017   Procedure: A/V Fistulagram;  Surgeon: Katha Cabal, MD;  Location: [place] [facility];   Service: Cardiovascular;  Laterality: Left;  . A/V SHUNT INTERVENTION N/A 02/24/2017   Procedure: A/V Shunt Intervention;  Surgeon: Katha Cabal, MD;  Location: [place] [facility];  Service: Cardiovascular;  Laterality: N/A;  . AV FISTULA PLACEMENT Left   . AV FISTULA PLACEMENT Left 12/02/2017   Procedure: ARTERIOVENOUS (AV) FISTULA CREATION ( BRACHIAL CEPHALIC );  Surgeon: Katha Cabal, MD;  Location: [facility];  Service: Vascular;  Laterality: Left;  . CARDIAC CATHETERIZATION  2012   no stents  . DIALYSIS/PERMA CATHETER REMOVAL N/A 08/24/2018   Procedure: DIALYSIS/PERMA CATHETER REMOVAL;  Surgeon: Katha Cabal, MD;  Location: [place] [facility];  Service: Cardiovascular;  Laterality: N/A;  . ESOPHAGOGASTRODUODENOSCOPY (EGD) WITH PROPOFOL N/A 08/31/2018   Procedure: ESOPHAGOGASTRODUODENOSCOPY (EGD) WITH PROPOFOL;  Surgeon: Lucilla Lame, MD;  Location: [facility];  Service: Endoscopy;  Laterality: N/A;  . INSERTION OF DIALYSIS CATHETER Left 05/28/2018   Procedure: INSERTION OF DIALYSIS CATHETER ( TUNNELED CATH INSERT );  Surgeon: Katha Cabal, MD;  Location: [facility];  Service: Vascular;  Laterality: Left;  . LIGATION OF ARTERIOVENOUS  FISTULA Left 12/02/2017   Procedure: LIGATION OF ARTERIOVENOUS  FISTULA ( REMOVAL LEFT WRIST FISTULA );  Surgeon: Katha Cabal, MD;  Location: [facility];  Service: Vascular;  Laterality: Left;  . PERIPHERAL VASCULAR CATHETERIZATION Left 06/03/2016   Procedure: A/V Shuntogram/Fistulagram;  Surgeon: Katha Cabal, MD;  Location: [place] [facility];  Service: Cardiovascular;  Laterality: Left;  . PERIPHERAL VASCULAR CATHETERIZATION  06/03/2016   Procedure: Peripheral Vascular Balloon Angioplasty;  Surgeon: Katha Cabal, MD;  Location: [place] [facility];  Service: Cardiovascular;;  . REVISON OF ARTERIOVENOUS FISTULA Left 05/28/2018   Procedure: REVISON OF ARTERIOVENOUS FISTULA ( REMOVE ANEURYSM );  Surgeon: Katha Cabal,  MD;  Location: [facility];  Service: Vascular;  Laterality: Left;  . UPPER EXTREMITY VENOGRAPHY N/A 04/13/2018   Procedure: UPPER EXTREMITY VENOGRAPHY;  Surgeon: Katha Cabal, MD;  Location: [place] [facility];  Service: Cardiovascular;  Laterality: N/A;    Prior to Admission medications   Medication Sig Start Date End Date Taking? Authorizing Provider  acetaminophen (TYLENOL) 500 MG tablet Take 1 tablet (500 mg total) by mouth every 6 (six) hours as needed for mild pain. 08/31/18   Salary, Avel Peace, MD  albuterol (PROVENTIL HFA;VENTOLIN HFA) 108 (90 BASE) MCG/ACT inhaler Inhale into the lungs every 6 (six) hours as needed for wheezing or shortness of breath.    [provider]  allopurinol (ZYLOPRIM) 100 MG tablet Take 200 mg by mouth daily.    [provider]  aspirin EC 81 MG tablet Take 81 mg by mouth daily.    [provider]  beclomethasone (QVAR) 80 MCG/ACT inhaler Inhale 1 puff into the lungs as needed (for shortness of breath).    [provider]  carvedilol (COREG CR) 10 MG 24 hr capsule Take 10 mg by mouth daily.    [provider]  cinacalcet (SENSIPAR) 60 MG tablet Take 120 mg by mouth daily.     [provider]  cyanocobalamin 1000 MCG tablet Take 1,000 mcg daily by mouth.    [provider]  felodipine (PLENDIL) 2.5 MG 24 hr tablet Take 2.5 mg by mouth daily.     [provider]  Lacosamide (VIMPAT) 150 MG TABS Take 150 mg by mouth 2 (two) times daily. Take an 3rd dose after dialysis on M-W-F    [provider]  levETIRAcetam (KEPPRA) 500 MG tablet Take 500 mg by mouth 2 (two) times daily. Take a 3rd dose of 500 mgs after dialysis only on dialysis days (M-W-F)    [provider]  oxyCODONE-acetaminophen (PERCOCET) 5-325 MG tablet Take 1 tablet by mouth every 4 (four) hours as needed for severe pain. Patient not taking: Reported on 08/30/2018 05/28/18 05/28/19  Schnier, Dolores Lory, MD    pantoprazole (PROTONIX) 40 MG tablet Take 1 tablet (40 mg total) by mouth 2 (two) times daily. 08/31/18   Salary, Avel Peace, MD  sevelamer carbonate (RENVELA) 800 MG tablet Take 2,400 mg by mouth 3 (three) times daily with meals.     [provider]    Allergies Codeine and Doxycycline  Family History  Problem Relation Age of Onset  . Heart disease Mother   . Kidney disease Paternal Uncle     Social History Social History   Tobacco Use  . Smoking status: Former Smoker    Types: Cigarettes  . Smokeless tobacco: Former Systems developer    Quit date: 2010  . Tobacco comment: did not inhale!!  Substance Use Topics  . Alcohol use: No  . Drug use: No    Review of Systems  Constitutional: No fever. Eyes: No redness. ENT: No sore throat. Cardiovascular: Denies chest pain. Respiratory: Denies shortness of breath. Gastrointestinal: Positive for resolved vomiting.  Genitourinary: Negative for flank pain.  Musculoskeletal: Negative for back  pain. Skin: Negative for rash. Neurological: Negative for headache.   ____________________________________________   PHYSICAL EXAM:  VITAL SIGNS: ED Triage Vitals  Enc Vitals Group     BP 09/01/18 1459 105/64     Pulse Rate 09/01/18 1459 80     Resp 09/01/18 1459 18     Temp 09/01/18 1459 97.9 F (36.6 C)     Temp Source 09/01/18 1459 Oral     SpO2 09/01/18 1456 97 %     Weight 09/01/18 1500 166 lb 6.4 oz (75.5 kg)     Height 09/01/18 1500 5\' 8"  (1.727 m)     Head Circumference --      Peak Flow --      Pain Score 09/01/18 1500 4     Pain Loc --      Pain Edu? --      Excl. in [place]? --     Constitutional: Alert and oriented. Well appearing and in no acute distress. Eyes: Conjunctivae are normal.  Head: Atraumatic. Nose: No congestion/rhinnorhea. Mouth/Throat: Mucous membranes are slightly dry.   Neck: Normal range of motion.  Cardiovascular: Normal rate, regular rhythm. Grossly normal heart sounds.  Good peripheral  circulation. Respiratory: Normal respiratory effort.  No retractions. Lungs CTAB. Gastrointestinal: Soft and nontender. No distention.  Genitourinary: No flank tenderness. Musculoskeletal:  Extremities warm and well perfused.  Neurologic:  Normal speech and language. No gross focal neurologic deficits are appreciated.  Skin:  Skin is warm and dry. No rash noted. Psychiatric: Mood and affect are normal. Speech and behavior are normal.  ____________________________________________   LABS (all labs ordered are listed, but only abnormal results are displayed)  Labs Reviewed  BASIC METABOLIC PANEL - Abnormal; Notable for the following components:      Result Value   Potassium 3.4 (*)    Chloride 94 (*)    Glucose, Bld 122 (*)    BUN 45 (*)    Creatinine, Ser 5.01 (*)    Calcium 8.0 (*)    GFR calc non Af Amer 11 (*)    GFR calc Af Amer 13 (*)    Anion gap 17 (*)    All other components within normal limits  HEPATIC FUNCTION PANEL - Abnormal; Notable for the following components:   Total Protein 6.3 (*)    AST 13 (*)    All other components within normal limits  CBC WITH DIFFERENTIAL/PLATELET - Abnormal; Notable for the following components:   RBC 2.26 (*)    Hemoglobin 7.2 (*)    HCT 21.1 (*)    All other components within normal limits  LIPASE, BLOOD  PREPARE RBC (CROSSMATCH)   ____________________________________________  EKG  ED ECG REPORT I, Arta Silence, the attending physician, personally viewed and interpreted this ECG.  Date: 09/01/2018 EKG Time: 1533 Rate: 81 Rhythm: normal sinus rhythm QRS Axis: normal Intervals: normal ST/T Wave abnormalities: normal Narrative Interpretation: no evidence of acute ischemia  ____________________________________________  RADIOLOGY    ____________________________________________   PROCEDURES  Procedure(s) performed: No  Procedures  Critical Care performed:  No ____________________________________________   INITIAL IMPRESSION / ASSESSMENT AND PLAN / ED COURSE  Pertinent labs & imaging results that were available during my care of the patient were reviewed by me and considered in my medical decision making (see chart for details).  62 year old male with PMH as noted above including ESRD on dialysis presents with nausea and vomiting that occurred during dialysis.  I reviewed the past medical records in Epic; the patient  was seen in the ED 2 days ago (by me) with near syncope and was found to be significantly anemic.  He was admitted at that time.  Per the record, he received a blood transfusion and an EGD which revealed gastritis but no active bleeding.  On exam, the patient is relatively well-appearing and his vital signs are normal.  The abdomen is soft and nontender.  Overall the presentation is most consistent with gastritis versus gastroenteritis or other relatively benign etiology.  The patient denies any significant abdominal pain and does not feel weak or near syncopal at this time.  His EKG is normal.  We will give symptomatic treatment with Zofran, obtain labs, and reassess.  ----------------------------------------- 5:18 PM on 09/01/2018 -----------------------------------------  On further discussion with the patient he does not clearly remember the episode of vomiting.  He is somewhat of a poor historian but it appears that he may have in fact syncopized.  He continues to be significantly anemic with a hemoglobin of 7.2.  His blood pressure is also borderline low at this time.  I feel that he needs to be readmitted for additional transfusion and possible further work-up.  The patient agrees with this.  I signed the patient out to the hospitalist Dr. Bridgett Larsson. ____________________________________________   FINAL CLINICAL IMPRESSION(S) / ED DIAGNOSES  Final diagnoses:  Anemia, unspecified type  Non-intractable vomiting with nausea,  unspecified vomiting type  Syncope, unspecified syncope type      NEW MEDICATIONS STARTED DURING THIS VISIT:  New Prescriptions   No medications on file     Note:  This document was prepared using Dragon voice recognition software and may include unintentional dictation errors.   Arta Silence, MD 09/01/18 1719

## 2018-09-01 NOTE — ED Notes (Signed)
Pt taken to dialysis by Elmyra Ricks, EDT to have dialysis graft de-accessed by Lenna Sciara, Dialysis RN.

## 2018-09-01 NOTE — ED Triage Notes (Signed)
Pt presents to ED via [facility] with c/o N/V from [facility], seen Monday for the same. Per EMS pt was dx with gastritis, pt endorses that his stools have been darker but not tarry. Per EMS pt had hgb 7.8 on Monday. Pt presents w/ dialysis graft to L upper arm that is still accessed from dialysis.

## 2018-09-01 NOTE — H&P (Signed)
[place] [facility] [place] NAME: Jeremy Rose    MR#:  [id number]  DATE OF BIRTH:  05/21/1956  DATE OF ADMISSION:  09/01/2018  PRIMARY CARE PHYSICIAN: Freddy Finner, NP   REQUESTING/REFERRING PHYSICIAN: Arta Silence, MD  CHIEF COMPLAINT:   Chief Complaint  Patient presents with  . Nausea  . Emesis   Nausea and vomiting during hemodialysis today. HISTORY OF PRESENT ILLNESS:  Jeremy Rose  is a 62 y.o. male with a known history of multiple medical problems as below.  The patient was just discharged to home 2 days ago after treatment of anemia and GI bleeding.  He got blood transfusion.  Endoscopy showed gastritis.  He is sent to ED from hemodialysis center due to nausea and vomiting, and possible syncope episode.  The patient complains of lightheadedness but he does not know whether he passed out or not.  He does not know whether he has melena or bloody stool.  His blood pressure is low at 92/60 and the hemoglobin decreased to 7.2 in the ED today.  ED physician requested admission.  PAST MEDICAL HISTORY:   Past Medical History:  Diagnosis Date  . Arthritis    hands  . Asthma    uses inhaler  . Coronary artery disease   . Dialysis patient [facility]) 2012   Monday, Wednesday, Friday  . GERD (gastroesophageal reflux disease)   . Gout of right hand   . Hypertension   . Renal disorder    stage V..on dialysis  . Seizures ([place]) 2019   last seizure 5 years ago. had 20 in one day    PAST SURGICAL HISTORY:   Past Surgical History:  Procedure Laterality Date  . A/V FISTULAGRAM Left 02/24/2017   Procedure: A/V Fistulagram;  Surgeon: Katha Cabal, MD;  Location: [place] [facility];  Service: Cardiovascular;  Laterality: Left;  . A/V SHUNT INTERVENTION N/A 02/24/2017   Procedure: A/V Shunt Intervention;  Surgeon: Katha Cabal, MD;  Location: [place] [facility];  Service: Cardiovascular;  Laterality: N/A;  . AV FISTULA  PLACEMENT Left   . AV FISTULA PLACEMENT Left 12/02/2017   Procedure: ARTERIOVENOUS (AV) FISTULA CREATION ( BRACHIAL CEPHALIC );  Surgeon: Katha Cabal, MD;  Location: [facility];  Service: Vascular;  Laterality: Left;  . CARDIAC CATHETERIZATION  2012   no stents  . DIALYSIS/PERMA CATHETER REMOVAL N/A 08/24/2018   Procedure: DIALYSIS/PERMA CATHETER REMOVAL;  Surgeon: Katha Cabal, MD;  Location: [place] [facility];  Service: Cardiovascular;  Laterality: N/A;  . ESOPHAGOGASTRODUODENOSCOPY (EGD) WITH PROPOFOL N/A 08/31/2018   Procedure: ESOPHAGOGASTRODUODENOSCOPY (EGD) WITH PROPOFOL;  Surgeon: Lucilla Lame, MD;  Location: [facility];  Service: Endoscopy;  Laterality: N/A;  . INSERTION OF DIALYSIS CATHETER Left 05/28/2018   Procedure: INSERTION OF DIALYSIS CATHETER ( TUNNELED CATH INSERT );  Surgeon: Katha Cabal, MD;  Location: [facility];  Service: Vascular;  Laterality: Left;  . LIGATION OF ARTERIOVENOUS  FISTULA Left 12/02/2017   Procedure: LIGATION OF ARTERIOVENOUS  FISTULA ( REMOVAL LEFT WRIST FISTULA );  Surgeon: Katha Cabal, MD;  Location: [facility];  Service: Vascular;  Laterality: Left;  . PERIPHERAL VASCULAR CATHETERIZATION Left 06/03/2016   Procedure: A/V Shuntogram/Fistulagram;  Surgeon: Katha Cabal, MD;  Location: [place] [facility];  Service: Cardiovascular;  Laterality: Left;  . PERIPHERAL VASCULAR CATHETERIZATION  06/03/2016   Procedure: Peripheral Vascular Balloon Angioplasty;  Surgeon: Katha Cabal, MD;  Location: [place] [facility];  Service: Cardiovascular;;  . REVISON OF ARTERIOVENOUS FISTULA Left 05/28/2018   Procedure: REVISON OF ARTERIOVENOUS FISTULA ( REMOVE ANEURYSM );  Surgeon: Katha Cabal, MD;  Location: [facility];  Service: Vascular;  Laterality: Left;  . UPPER EXTREMITY VENOGRAPHY N/A 04/13/2018   Procedure: UPPER EXTREMITY VENOGRAPHY;  Surgeon: Katha Cabal, MD;  Location: [place] [facility];  Service: Cardiovascular;   Laterality: N/A;    SOCIAL HISTORY:   Social History   Tobacco Use  . Smoking status: Former Smoker    Types: Cigarettes  . Smokeless tobacco: Former Systems developer    Quit date: 2010  . Tobacco comment: did not inhale!!  Substance Use Topics  . Alcohol use: No    FAMILY HISTORY:   Family History  Problem Relation Age of Onset  . Heart disease Mother   . Kidney disease Paternal Uncle     DRUG ALLERGIES:   Allergies  Allergen Reactions  . Codeine Nausea And Vomiting  . Doxycycline Nausea And Vomiting    REVIEW OF SYSTEMS:   Review of Systems  Constitutional: Positive for malaise/fatigue. Negative for chills and fever.  HENT: Negative for sore throat.   Eyes: Negative for blurred vision and double vision.  Respiratory: Negative for cough, hemoptysis, shortness of breath, wheezing and stridor.   Cardiovascular: Negative for chest pain, palpitations, orthopnea and leg swelling.  Gastrointestinal: Negative for abdominal pain, blood in stool, diarrhea, melena, nausea and vomiting.  Genitourinary: Negative for dysuria, flank pain and hematuria.  Musculoskeletal: Negative for back pain and joint pain.  Skin: Negative for rash.  Neurological: Positive for dizziness. Negative for sensory change, focal weakness, seizures, loss of consciousness, weakness and headaches.  Endo/Heme/Allergies: Negative for polydipsia.  Psychiatric/Behavioral: Negative for depression. The patient is not nervous/anxious.     MEDICATIONS AT HOME:   Prior to Admission medications   Medication Sig Start Date End Date Taking? Authorizing Provider  acetaminophen (TYLENOL) 500 MG tablet Take 1 tablet (500 mg total) by mouth every 6 (six) hours as needed for mild pain. 08/31/18   Salary, Avel Peace, MD  albuterol (PROVENTIL HFA;VENTOLIN HFA) 108 (90 BASE) MCG/ACT inhaler Inhale into the lungs every 6 (six) hours as needed for wheezing or shortness of breath.    [provider]  allopurinol (ZYLOPRIM)  100 MG tablet Take 200 mg by mouth daily.    [provider]  aspirin EC 81 MG tablet Take 81 mg by mouth daily.    [provider]  beclomethasone (QVAR) 80 MCG/ACT inhaler Inhale 1 puff into the lungs as needed (for shortness of breath).    [provider]  carvedilol (COREG CR) 10 MG 24 hr capsule Take 10 mg by mouth daily.    [provider]  cinacalcet (SENSIPAR) 60 MG tablet Take 120 mg by mouth daily.     [provider]  cyanocobalamin 1000 MCG tablet Take 1,000 mcg daily by mouth.    [provider]  felodipine (PLENDIL) 2.5 MG 24 hr tablet Take 2.5 mg by mouth daily.     [provider]  Lacosamide (VIMPAT) 150 MG TABS Take 150 mg by mouth 2 (two) times daily. Take an 3rd dose after dialysis on M-W-F    [provider]  levETIRAcetam (KEPPRA) 500 MG tablet Take 500 mg by mouth 2 (two) times daily. Take a 3rd dose of 500 mgs after dialysis only on dialysis days (M-W-F)    [provider]  oxyCODONE-acetaminophen (PERCOCET) 5-325 MG tablet Take 1  tablet by mouth every 4 (four) hours as needed for severe pain. Patient not taking: Reported on 08/30/2018 05/28/18 05/28/19  Schnier, Dolores Lory, MD  pantoprazole (PROTONIX) 40 MG tablet Take 1 tablet (40 mg total) by mouth 2 (two) times daily. 08/31/18   Salary, Avel Peace, MD  sevelamer carbonate (RENVELA) 800 MG tablet Take 2,400 mg by mouth 3 (three) times daily with meals.     [provider]      VITAL SIGNS:  Blood pressure 92/60, pulse 80, temperature 97.9 F (36.6 C), temperature source Oral, resp. rate 16, height 5\' 8"  (1.727 m), weight 75.5 kg, SpO2 96 %.  PHYSICAL EXAMINATION:  Physical Exam  GENERAL:  62 y.o.-year-old patient lying in the bed with no acute distress.  EYES: Pupils equal, round, reactive to light and accommodation. No scleral icterus. Extraocular muscles intact.  HEENT: Head atraumatic, normocephalic. Oropharynx and nasopharynx  clear.  NECK:  Supple, no jugular venous distention. No thyroid enlargement, no tenderness.  LUNGS: Normal breath sounds bilaterally, no wheezing, rales,rhonchi or crepitation. No use of accessory muscles of respiration.  CARDIOVASCULAR: S1, S2 normal. No murmurs, rubs, or gallops.  ABDOMEN: Soft, nontender, nondistended. Bowel sounds present. No organomegaly or mass.  EXTREMITIES: No pedal edema, cyanosis, or clubbing.  NEUROLOGIC: Cranial nerves II through XII are intact. Muscle strength 5/5 in all extremities. Sensation intact. Gait not checked.  PSYCHIATRIC: The patient is alert and oriented x 3.  SKIN: No obvious rash, lesion, or ulcer.   LABORATORY PANEL:   CBC Recent Labs  Lab 09/01/18 1544  WBC 6.1  HGB 7.2*  HCT 21.1*  PLT 184   ------------------------------------------------------------------------------------------------------------------  Chemistries  Recent Labs  Lab 09/01/18 1544  NA 137  K 3.4*  CL 94*  CO2 26  GLUCOSE 122*  BUN 45*  CREATININE 5.01*  CALCIUM 8.0*  AST 13*  ALT 10  ALKPHOS 87  BILITOT 0.5   ------------------------------------------------------------------------------------------------------------------  Cardiac Enzymes Recent Labs  Lab 08/30/18 1806  TROPONINI 0.03*   ------------------------------------------------------------------------------------------------------------------  RADIOLOGY:  Dg Chest Portable 1 View  Result Date: 08/30/2018 CLINICAL DATA:  Dialysis patient with acute weakness. EXAM: PORTABLE CHEST 1 VIEW COMPARISON:  10/25/2014 FINDINGS: Heart size is at the upper limits of normal. Chronic aortic atherosclerosis. There is chronic pulmonary scarring at the left base. Otherwise the lungs are clear. No edema or effusions. No significant bone finding. IMPRESSION: No active disease. Aortic atherosclerosis. Chronic left base pulmonary scarring. No edema or evidence of fluid overload. Electronically Signed   By: Nelson Chimes M.D.   On: 08/30/2018 19:00      IMPRESSION AND PLAN:   Symptomatic anemia. The patient will be placed to observation. PRBC transfusion 1 unit for now.  Follow-up hemoglobin in a.m.  PRBC transfusion PRN.  Gastritis and duodenitis.  Continue Protonix p.o. twice daily.  Nausea vomiting.  Zofran as needed.  ESRD.  The patient finished hemodialysis today.  Hypertension.  Hold felodipine and Coreg due to low side blood pressure.  All the records are reviewed and case discussed with ED provider. Management plans discussed with the patient, family and they are in agreement.  CODE STATUS: Full code  TOTAL TIME TAKING CARE OF THIS PATIENT: 36 minutes.    Demetrios Loll M.D on 09/01/2018 at 5:34 PM  Between 7am to 6pm - Pager - [phone]  After 6pm go to [email or web address] - Proofreader  [employer] [facility] [place] [facility]  Office  [phone]  CC: Primary care physician; Freddy Finner, NP  Note: This dictation was prepared with Dragon dictation along with smaller phrase technology. Any transcriptional errors that result from this process are unin

## 2018-09-01 NOTE — Plan of Care (Signed)
Blood administration started.  Problem: Education: Goal: Knowledge of General Education information will improve Description Including pain rating scale, medication(s)/side effects and non-pharmacologic comfort measures Outcome: Progressing   Problem: Health Behavior/Discharge Planning: Goal: Ability to manage health-related needs will improve Outcome: Progressing   Problem: Clinical Measurements: Goal: Ability to maintain clinical measurements within normal limits will improve Outcome: Progressing Goal: Will remain free from infection Outcome: Progressing Goal: Diagnostic test results will improve Outcome: Progressing Goal: Respiratory complications will improve Outcome: Progressing Goal: Cardiovascular complication will be avoided Outcome: Progressing   Problem: Activity: Goal: Risk for activity intolerance will decrease Outcome: Progressing   Problem: Nutrition: Goal: Adequate nutrition will be maintained Outcome: Progressing   Problem: Coping: Goal: Level of anxiety will decrease Outcome: Progressing   Problem: Elimination: Goal: Will not experience complications related to bowel motility Outcome: Progressing Goal: Will not experience complications related to urinary retention Outcome: Progressing   Problem: Pain Managment: Goal: General experience of comfort will improve Outcome: Progressing   Problem: Safety: Goal: Ability to remain free from injury will improve Outcome: Progressing   Problem: Skin Integrity: Goal: Risk for impaired skin integrity will decrease Outcome: Progressing

## 2018-09-01 NOTE — ED Notes (Signed)
This RN called and notified by blood bank that blood was ready. Due to patient discharge another type and screen needed, this RN recollected type and screen and sent to blood bank.

## 2018-09-02 DIAGNOSIS — K922 Gastrointestinal hemorrhage, unspecified: Secondary | ICD-10-CM | POA: Diagnosis not present

## 2018-09-02 LAB — CBC
HEMATOCRIT: 20.4 % — AB (ref 39.0–52.0)
Hemoglobin: 7 g/dL — ABNORMAL LOW (ref 13.0–17.0)
MCH: 32 pg (ref 26.0–34.0)
MCHC: 34.3 g/dL (ref 30.0–36.0)
MCV: 93.2 fL (ref 80.0–100.0)
NRBC: 0 % (ref 0.0–0.2)
Platelets: 169 10*3/uL (ref 150–400)
RBC: 2.19 MIL/uL — AB (ref 4.22–5.81)
RDW: 16 % — ABNORMAL HIGH (ref 11.5–15.5)
WBC: 8.1 10*3/uL (ref 4.0–10.5)

## 2018-09-02 LAB — HEMOGLOBIN: Hemoglobin: 6.6 g/dL — ABNORMAL LOW (ref 13.0–17.0)

## 2018-09-02 MED ORDER — PANTOPRAZOLE SODIUM 40 MG IV SOLR
40.0000 mg | Freq: Two times a day (BID) | INTRAVENOUS | Status: DC
  Administered 2018-09-02 – 2018-09-04 (×6): 40 mg via INTRAVENOUS
  Filled 2018-09-02 (×6): qty 40

## 2018-09-02 NOTE — Progress Notes (Signed)
[facility] NAME: Jeremy Rose    MR#:  [id number]  DATE OF BIRTH:  12-11-1955  SUBJECTIVE: Patient is seen at bedside, admitted for symptomatic anemia, received 1 unit of transfusion yesterday, hemoglobin before transfusion 7.2, because of his symptoms of weakness received 1 unit of blood transfusion, hemoglobin decreased to 6.6, repeat hemoglobin obtained again and it is at 7.  Patient denies any complaints.  CHIEF COMPLAINT:   Chief Complaint  Patient presents with  . Nausea  . Emesis    REVIEW OF SYSTEMS:    Review of Systems  Constitutional: Negative for chills and fever.  HENT: Negative for hearing loss.   Eyes: Negative for blurred vision, double vision and photophobia.  Respiratory: Negative for cough, hemoptysis and shortness of breath.   Cardiovascular: Negative for palpitations, orthopnea and leg swelling.  Gastrointestinal: Negative for abdominal pain, diarrhea and vomiting.  Genitourinary: Negative for dysuria and urgency.  Musculoskeletal: Negative for myalgias and neck pain.  Skin: Negative for rash.  Neurological: Negative for dizziness, focal weakness, seizures, weakness and headaches.  Psychiatric/Behavioral: Negative for memory loss. The patient does not have insomnia.     Nutrition:  Tolerating Diet: Tolerating PT:      DRUG ALLERGIES:   Allergies  Allergen Reactions  . Codeine Nausea And Vomiting  . Doxycycline Nausea And Vomiting    VITALS:  Blood pressure 120/68, pulse 80, temperature 98.5 F (36.9 C), temperature source Oral, resp. rate 16, height 5\' 8"  (1.727 m), weight 75.5 kg, SpO2 96 %.  PHYSICAL EXAMINATION:   Physical Exam  GENERAL:  62 y.o.-year-old patient lying in the bed with no acute distress.  EYES: Pupils equal, round, reactive to light and accommodation. No scleral icterus. Extraocular muscles intact.  HEENT: Head atraumatic, normocephalic. Oropharynx and  nasopharynx clear.  NECK:  Supple, no jugular venous distention. No thyroid enlargement, no tenderness.  LUNGS: Normal breath sounds bilaterally, no wheezing, rales,rhonchi or crepitation. No use of accessory muscles of respiration.  CARDIOVASCULAR: S1, S2 normal. No murmurs, rubs, or gallops.  ABDOMEN: Soft, nontender, nondistended. Bowel sounds present. No organomegaly or mass.  EXTREMITIES: No pedal edema, cyanosis, or clubbing.  NEUROLOGIC: Cranial nerves II through XII are intact. Muscle strength 5/5 in all extremities. Sensation intact. Gait not checked.  PSYCHIATRIC: The patient is alert and oriented x 3.  SKIN: No obvious rash, lesion, or ulcer.    LABORATORY PANEL:   CBC Recent Labs  Lab 09/02/18 1020  WBC 8.1  HGB 7.0*  HCT 20.4*  PLT 169   ------------------------------------------------------------------------------------------------------------------  Chemistries  Recent Labs  Lab 09/01/18 1544  NA 137  K 3.4*  CL 94*  CO2 26  GLUCOSE 122*  BUN 45*  CREATININE 5.01*  CALCIUM 8.0*  AST 13*  ALT 10  ALKPHOS 87  BILITOT 0.5   ------------------------------------------------------------------------------------------------------------------  Cardiac Enzymes Recent Labs  Lab 08/30/18 1806  TROPONINI 0.03*   ------------------------------------------------------------------------------------------------------------------  RADIOLOGY:  No results found.   ASSESSMENT AND PLAN:   Active Problems:   Anemia   #1. symptomatic anemia, status post EGD 2 days ago showed gastritis, duodenitis, discharged home with Protonix but patient felt weak and passed out at dialysis yesterday, since hemoglobin at the time of discharge 2 days ago was 7.8 but now 7.2 yesterday received 1 unit of packed RBC because of symptoms, he feels better but black stool still present so we are continuing IV Protonix every 12, monitor closely, possible laparotomy with  hemoglobin 6.2 this  morning again came up to 7 by repeat test test which is not off his baseline.  Monitor hemodynamic status, consult gastroenterology if patient continues to drop hemoglobin.  ESRD: Hemodialysis on Monday, Wednesday, Friday.  Syncope likely vasovagal with nausea, transient hypotension in the ER yesterday, check already vitals, hold BP medicines as BP still soft 50% time spent in counseling, coordination of care  All the records are reviewed and case discussed with Care Management/Social Workerr. Management plans discussed with the patient, family and they are in agreement.  CODE STATUS: Full code  TOTAL TIME TAKING CARE OF THIS PATIENT: 40 minutes.   POSSIBLE D/C IN 1-2 DAYS, DEPENDING ON CLINICAL CONDITION.   Epifanio Lesches M.D on 09/02/2018 at 11:42 AM  Between 7am to 6pm - Pager - [phone]  After 6pm go to [email or web address] - password [email or web address] [facility]  [facility]  Office  [phone]  CC: Primary care physician; Freddy Finner, NP

## 2018-09-02 NOTE — Care Management (Signed)
Amanda Morris dialysis liaison notified of admission.    

## 2018-09-02 NOTE — Progress Notes (Signed)
Called Dr. Marcille Blanco regarding patient's Hgb - 6.6.  Orders will be placed for GI consult and other appropriate orders.  Christene Slates

## 2018-09-02 NOTE — Progress Notes (Signed)
MD notified that pt.'s hgb is 6.6. VSS. Pt is asymptomatic at this time. New orders to be placed by MD.   Jeremy Rose

## 2018-09-02 NOTE — Care Management Obs Status (Signed)
[facility] NOTIFICATION   Patient Details  Name: MICHOLAS DRUMWRIGHT MRN: [id number] Date of Birth: Mar 10, 1956   Medicare Observation Status Notification Given:  Yes    Beverly Sessions, RN 09/02/2018, 2:33 PM

## 2018-09-02 NOTE — Progress Notes (Signed)
Verbal orders to place orthostatic vital signs once  and d/c telemetry at this time.   Ryota Treece [employer]

## 2018-09-03 DIAGNOSIS — N186 End stage renal disease: Secondary | ICD-10-CM | POA: Diagnosis present

## 2018-09-03 DIAGNOSIS — R3911 Hesitancy of micturition: Secondary | ICD-10-CM | POA: Diagnosis present

## 2018-09-03 DIAGNOSIS — D500 Iron deficiency anemia secondary to blood loss (chronic): Secondary | ICD-10-CM | POA: Diagnosis not present

## 2018-09-03 DIAGNOSIS — Z79899 Other long term (current) drug therapy: Secondary | ICD-10-CM | POA: Diagnosis not present

## 2018-09-03 DIAGNOSIS — M109 Gout, unspecified: Secondary | ICD-10-CM | POA: Diagnosis present

## 2018-09-03 DIAGNOSIS — G40909 Epilepsy, unspecified, not intractable, without status epilepticus: Secondary | ICD-10-CM | POA: Diagnosis present

## 2018-09-03 DIAGNOSIS — K922 Gastrointestinal hemorrhage, unspecified: Secondary | ICD-10-CM | POA: Diagnosis not present

## 2018-09-03 DIAGNOSIS — K3189 Other diseases of stomach and duodenum: Secondary | ICD-10-CM | POA: Diagnosis not present

## 2018-09-03 DIAGNOSIS — I10 Essential (primary) hypertension: Secondary | ICD-10-CM | POA: Diagnosis present

## 2018-09-03 DIAGNOSIS — K219 Gastro-esophageal reflux disease without esophagitis: Secondary | ICD-10-CM | POA: Diagnosis present

## 2018-09-03 DIAGNOSIS — Z992 Dependence on renal dialysis: Secondary | ICD-10-CM | POA: Diagnosis not present

## 2018-09-03 DIAGNOSIS — D509 Iron deficiency anemia, unspecified: Secondary | ICD-10-CM | POA: Diagnosis present

## 2018-09-03 DIAGNOSIS — R339 Retention of urine, unspecified: Secondary | ICD-10-CM | POA: Diagnosis not present

## 2018-09-03 DIAGNOSIS — N2581 Secondary hyperparathyroidism of renal origin: Secondary | ICD-10-CM | POA: Diagnosis present

## 2018-09-03 DIAGNOSIS — K449 Diaphragmatic hernia without obstruction or gangrene: Secondary | ICD-10-CM | POA: Diagnosis present

## 2018-09-03 DIAGNOSIS — R3915 Urgency of urination: Secondary | ICD-10-CM | POA: Diagnosis present

## 2018-09-03 DIAGNOSIS — K2970 Gastritis, unspecified, without bleeding: Secondary | ICD-10-CM | POA: Diagnosis present

## 2018-09-03 DIAGNOSIS — K921 Melena: Secondary | ICD-10-CM | POA: Diagnosis not present

## 2018-09-03 DIAGNOSIS — D631 Anemia in chronic kidney disease: Secondary | ICD-10-CM | POA: Diagnosis present

## 2018-09-03 DIAGNOSIS — R55 Syncope and collapse: Secondary | ICD-10-CM | POA: Diagnosis present

## 2018-09-03 DIAGNOSIS — I2510 Atherosclerotic heart disease of native coronary artery without angina pectoris: Secondary | ICD-10-CM | POA: Diagnosis present

## 2018-09-03 DIAGNOSIS — D649 Anemia, unspecified: Secondary | ICD-10-CM | POA: Diagnosis not present

## 2018-09-03 DIAGNOSIS — D62 Acute posthemorrhagic anemia: Secondary | ICD-10-CM | POA: Diagnosis not present

## 2018-09-03 DIAGNOSIS — R112 Nausea with vomiting, unspecified: Secondary | ICD-10-CM | POA: Diagnosis present

## 2018-09-03 DIAGNOSIS — I120 Hypertensive chronic kidney disease with stage 5 chronic kidney disease or end stage renal disease: Secondary | ICD-10-CM | POA: Diagnosis present

## 2018-09-03 DIAGNOSIS — K2980 Duodenitis without bleeding: Secondary | ICD-10-CM | POA: Diagnosis present

## 2018-09-03 LAB — CBC
HCT: 17.5 % — ABNORMAL LOW (ref 39.0–52.0)
HEMATOCRIT: 22.4 % — AB (ref 39.0–52.0)
HEMOGLOBIN: 5.8 g/dL — AB (ref 13.0–17.0)
Hemoglobin: 7.9 g/dL — ABNORMAL LOW (ref 13.0–17.0)
MCH: 31.4 pg (ref 26.0–34.0)
MCH: 31.7 pg (ref 26.0–34.0)
MCHC: 33.1 g/dL (ref 30.0–36.0)
MCHC: 35.3 g/dL (ref 30.0–36.0)
MCV: 94.6 fL (ref 80.0–100.0)
MCV: 90 fL (ref 80.0–100.0)
PLATELETS: 167 10*3/uL (ref 150–400)
Platelets: 162 10*3/uL (ref 150–400)
RBC: 1.85 MIL/uL — AB (ref 4.22–5.81)
RBC: 2.49 MIL/uL — ABNORMAL LOW (ref 4.22–5.81)
RDW: 15.9 % — ABNORMAL HIGH (ref 11.5–15.5)
RDW: 15.2 % (ref 11.5–15.5)
WBC: 5.8 10*3/uL (ref 4.0–10.5)
WBC: 5.2 10*3/uL (ref 4.0–10.5)
nRBC: 0 % (ref 0.0–0.2)
nRBC: 0 % (ref 0.0–0.2)

## 2018-09-03 LAB — HEMOGLOBIN AND HEMATOCRIT, BLOOD
HCT: 18.3 % — ABNORMAL LOW (ref 39.0–52.0)
Hemoglobin: 6.1 g/dL — ABNORMAL LOW (ref 13.0–17.0)

## 2018-09-03 MED ORDER — SODIUM CHLORIDE 0.9% IV SOLUTION: Freq: Once | INTRAVENOUS | Status: DC

## 2018-09-03 MED ORDER — CHLORHEXIDINE GLUCONATE CLOTH 2 % EX PADS
6.0000 | MEDICATED_PAD | Freq: Every day | CUTANEOUS | Status: DC
  Administered 2018-09-03 – 2018-09-04 (×2): 6 via TOPICAL

## 2018-09-03 MED ORDER — EPOETIN ALFA 10000 UNIT/ML IJ SOLN
10000.0000 [IU] | Freq: Once | INTRAMUSCULAR | Status: AC
  Administered 2018-09-03: 10000 [IU] via INTRAVENOUS

## 2018-09-03 NOTE — Progress Notes (Signed)
HD Treatment Complete    09/03/18 1930  Vital Signs  Pulse Rate 100  Resp 19  BP 121/62  BP Location Right Arm  BP Method Automatic  Patient Position (if appropriate) Lying  Oxygen Therapy  SpO2 98 %  O2 Device Room Air  During Hemodialysis Assessment  Blood Flow Rate (mL/min) 375 mL/min  Arterial Pressure (mmHg) -160 mmHg  Venous Pressure (mmHg) 310 mmHg  Transmembrane Pressure (mmHg) 70 mmHg  Ultrafiltration Rate (mL/min) 630 mL/min  Dialysate Flow Rate (mL/min) 800 ml/min  Conductivity: Machine  13.6  HD Safety Checks Performed Yes  Intra-Hemodialysis Comments Progressing as prescribed;Tx completed (UF 1178)

## 2018-09-03 NOTE — Progress Notes (Signed)
Post HD Treatment  Pt tolerated treatment well. His Net UF goal was 0. His BVP was 72.6.     09/03/18 1945  Hand-Off documentation  Report given to (Full Name) Cyndra Numbers, RN  Report received from (Full Name) Stephannie Peters, RN  Vital Signs  Pulse Rate (!) 101  Pulse Rate Source Monitor  Resp 20  BP 122/65  BP Location Right Arm  BP Method Automatic  Patient Position (if appropriate) Lying  Oxygen Therapy  SpO2 98 %  O2 Device Room Air  Pain Assessment  Pain Scale 0-10  Pain Score 0  Dialysis Weight  Weight 73.6 kg  Type of Weight Post-Dialysis  Post-Hemodialysis Assessment  Rinseback Volume (mL) 250 mL  KECN 289 V  Dialyzer Clearance Heavily streaked  Duration of HD Treatment -hour(s) 3.5 hour(s)  Hemodialysis Intake (mL) 1200 mL  UF Total -Machine (mL) 1200 mL  Net UF (mL) 0 mL  Tolerated HD Treatment Yes  Post-Hemodialysis Comments Pt tolerated treatment well  AVG/AVF Arterial Site Held (minutes) 15 minutes  AVG/AVF Venous Site Held (minutes) 10 minutes

## 2018-09-03 NOTE — Consult Note (Signed)
Date: 09/03/2018                  Patient Name:  Jeremy Rose  MRN: [id number]  DOB: 1956/08/29  Age / Sex: 62 y.o., male         PCP: Freddy Finner, NP                 Service Requesting Consult: IM/ Epifanio Lesches, MD                 Reason for Consult: ESRD            History of Present Illness: Patient is a 62 y.o. male with medical problems of end-stage renal disease on hemodialysis for 4 to 5 years, coronary disease, who was admitted to [facility] on 09/01/2018 .  He presented to the emergency room with nausea and vomiting of acute onset.  He felt lightheaded after his dialysis on Wednesday  He was recently admitted few days ago for evaluation and management of melena and anemia.   He underwent upper endoscopy during previous admission which showed localized mild inflammation characterized by erythema in the gastric antrum and localized moderate inflammation in the duodenal bulb.  Medical therapy with PPI was recommended. This admission, he is noted to have progressively decreasing hemoglobin on serial measurements.      Medications: Outpatient medications: Medications Prior to Admission  Medication Sig Dispense Refill Last Dose  . acetaminophen (TYLENOL) 500 MG tablet Take 1 tablet (500 mg total) by mouth every 6 (six) hours as needed for mild pain. 180 tablet 0 PRN at PRN  . albuterol (PROVENTIL HFA;VENTOLIN HFA) 108 (90 BASE) MCG/ACT inhaler Inhale into the lungs every 6 (six) hours as needed for wheezing or shortness of breath.   PRN at PRN  . allopurinol (ZYLOPRIM) 100 MG tablet Take 200 mg by mouth daily.   09/01/2018 at Unknown time  . aspirin EC 81 MG tablet Take 81 mg by mouth daily.   09/01/2018 at Unknown time  . beclomethasone (QVAR) 80 MCG/ACT inhaler Inhale 1 puff into the lungs as needed (for shortness of breath).   PRN at PRN  . carvedilol (COREG CR) 10 MG 24 hr capsule Take 10 mg by mouth daily.   Past Month at Unknown time  . cinacalcet (SENSIPAR) 60  MG tablet Take 120 mg by mouth daily.    09/01/2018 at Unknown time  . cyanocobalamin 1000 MCG tablet Take 1,000 mcg daily by mouth.   09/01/2018 at Unknown time  . felodipine (PLENDIL) 2.5 MG 24 hr tablet Take 2.5 mg by mouth daily.    08/31/2018 at Unknown time  . Lacosamide (VIMPAT) 150 MG TABS Take 150 mg by mouth 2 (two) times daily. Take an 3rd dose after dialysis on M-W-F   09/01/2018 at Unknown time  . levETIRAcetam (KEPPRA) 500 MG tablet Take 500 mg by mouth 2 (two) times daily. Take a 3rd dose of 500 mgs after dialysis only on dialysis days (M-W-F)   09/01/2018 at Unknown time  . pantoprazole (PROTONIX) 40 MG tablet Take 1 tablet (40 mg total) by mouth 2 (two) times daily. 120 tablet 0 09/01/2018 at Unknown time  . sevelamer carbonate (RENVELA) 800 MG tablet Take 2,400 mg by mouth 3 (three) times daily with meals.    09/01/2018 at Unknown time  . oxyCODONE-acetaminophen (PERCOCET) 5-325 MG tablet Take 1 tablet by mouth every 4 (four) hours as needed for severe pain. (Patient not taking: Reported on 08/30/2018) 40  tablet 0 Not Taking at Unknown time    Current medications: Current Facility-Administered Medications  Medication Dose Route Frequency Provider Last Rate Last Dose  . 0.9 %  sodium chloride infusion (Manually program via Guardrails IV Fluids)   Intravenous Once Epifanio Lesches, MD      . 0.9 %  sodium chloride infusion  10 mL/hr Intravenous Once Arta Silence, MD      . 0.9 %  sodium chloride infusion  250 mL Intravenous PRN Demetrios Loll, MD      . acetaminophen (TYLENOL) tablet 650 mg  650 mg Oral Q6H PRN Demetrios Loll, MD       Or  . acetaminophen (TYLENOL) suppository 650 mg  650 mg Rectal Q6H PRN Demetrios Loll, MD      . albuterol (PROVENTIL) (2.5 MG/3ML) 0.083% nebulizer solution 2.5 mg  2.5 mg Nebulization Q2H PRN Demetrios Loll, MD      . allopurinol (ZYLOPRIM) tablet 200 mg  200 mg Oral Daily Demetrios Loll, MD   200 mg at 09/03/18 0913  . bisacodyl (DULCOLAX) EC tablet 5  mg  5 mg Oral Daily PRN Demetrios Loll, MD      . budesonide (PULMICORT) nebulizer solution 0.25 mg  0.25 mg Nebulization PRN Demetrios Loll, MD      . Chlorhexidine Gluconate Cloth 2 % PADS 6 each  6 each Topical Q0600 Murlean Iba, MD   6 each at 09/03/18 1121  . cinacalcet (SENSIPAR) tablet 120 mg  120 mg Oral Q breakfast Demetrios Loll, MD   120 mg at 09/03/18 0914  . lacosamide (VIMPAT) tablet 150 mg  150 mg Oral BID Demetrios Loll, MD   150 mg at 09/03/18 0913  . lacosamide (VIMPAT) tablet 150 mg  150 mg Oral Q M,W,F-HD Demetrios Loll, MD   150 mg at 09/01/18 1943  . levETIRAcetam (KEPPRA) tablet 500 mg  500 mg Oral BID Demetrios Loll, MD   500 mg at 09/03/18 0913  . levETIRAcetam (KEPPRA) tablet 500 mg  500 mg Oral Q M,W,F-HD Demetrios Loll, MD      . ondansetron [facility]) tablet 4 mg  4 mg Oral Q6H PRN Demetrios Loll, MD       Or  . ondansetron [facility]) injection 4 mg  4 mg Intravenous Q6H PRN Demetrios Loll, MD      . pantoprazole (PROTONIX) injection 40 mg  40 mg Intravenous Q12H Epifanio Lesches, MD   40 mg at 09/03/18 0914  . senna-docusate (Senokot-S) tablet 1 tablet  1 tablet Oral QHS PRN Demetrios Loll, MD      . sevelamer carbonate (RENVELA) tablet 2,400 mg  2,400 mg Oral TID WC Demetrios Loll, MD   2,400 mg at 09/03/18 1121  . sodium chloride flush (NS) 0.9 % injection 3 mL  3 mL Intravenous Q12H Demetrios Loll, MD   3 mL at 09/03/18 0912  . sodium chloride flush (NS) 0.9 % injection 3 mL  3 mL Intravenous PRN Demetrios Loll, MD      . vitamin B-12 (CYANOCOBALAMIN) tablet 1,000 mcg  1,000 mcg Oral Daily Demetrios Loll, MD   Stopped at 09/03/18 [phone]      Allergies: Allergies  Allergen Reactions  . Codeine Nausea And Vomiting  . Doxycycline Nausea And Vomiting      Past Medical History: Past Medical History:  Diagnosis Date  . Arthritis    hands  . Asthma    uses inhaler  . Coronary artery disease   . Dialysis patient [facility]) 2012  Monday, Wednesday, Friday  . GERD (gastroesophageal reflux disease)   . Gout of  right hand   . Hypertension   . Renal disorder    stage V..on dialysis  . Seizures ([place]) 2019   last seizure 5 years ago. had 44 in one day     Past Surgical History: Past Surgical History:  Procedure Laterality Date  . A/V FISTULAGRAM Left 02/24/2017   Procedure: A/V Fistulagram;  Surgeon: Katha Cabal, MD;  Location: [place] [facility];  Service: Cardiovascular;  Laterality: Left;  . A/V SHUNT INTERVENTION N/A 02/24/2017   Procedure: A/V Shunt Intervention;  Surgeon: Katha Cabal, MD;  Location: [place] [facility];  Service: Cardiovascular;  Laterality: N/A;  . AV FISTULA PLACEMENT Left   . AV FISTULA PLACEMENT Left 12/02/2017   Procedure: ARTERIOVENOUS (AV) FISTULA CREATION ( BRACHIAL CEPHALIC );  Surgeon: Katha Cabal, MD;  Location: [facility] ORS;  Service: Vascular;  Laterality: Left;  . CARDIAC CATHETERIZATION  2012   no stents  . DIALYSIS/PERMA CATHETER REMOVAL N/A 08/24/2018   Procedure: DIALYSIS/PERMA CATHETER REMOVAL;  Surgeon: Katha Cabal, MD;  Location: [place] [facility];  Service: Cardiovascular;  Laterality: N/A;  . ESOPHAGOGASTRODUODENOSCOPY (EGD) WITH PROPOFOL N/A 08/31/2018   Procedure: ESOPHAGOGASTRODUODENOSCOPY (EGD) WITH PROPOFOL;  Surgeon: Lucilla Lame, MD;  Location: [facility] ENDOSCOPY;  Service: Endoscopy;  Laterality: N/A;  . INSERTION OF DIALYSIS CATHETER Left 05/28/2018   Procedure: INSERTION OF DIALYSIS CATHETER ( TUNNELED CATH INSERT );  Surgeon: Katha Cabal, MD;  Location: [facility] OR[facility];  Service: Vascular;  Laterality: Left;  . LIGATION OF ARTERIOVENOUS  FISTULA Left 12/02/2017   Procedure: LIGATION OF ARTERIOVENOUS  FISTULA ( REMOVAL LEFT WRIST FISTULA );  Surgeon: Katha Cabal, MD;  Location: [facility] OR[facility];  Service: Vascular;  Laterality: Left;  . PERIPHERAL VASCULAR CATHETERIZATION Left 06/03/2016   Procedure: A/V Shuntogram/Fistulagram;  Surgeon: Katha Cabal, MD;  Location: [place] [facility];  Service: Cardiovascular;   Laterality: Left;  . PERIPHERAL VASCULAR CATHETERIZATION  06/03/2016   Procedure: Peripheral Vascular Balloon Angioplasty;  Surgeon: Katha Cabal, MD;  Location: [place] [facility];  Service: Cardiovascular;;  . REVISON OF ARTERIOVENOUS FISTULA Left 05/28/2018   Procedure: REVISON OF ARTERIOVENOUS FISTULA ( REMOVE ANEURYSM );  Surgeon: Katha Cabal, MD;  Location: [facility] OR[facility];  Service: Vascular;  Laterality: Left;  . UPPER EXTREMITY VENOGRAPHY N/A 04/13/2018   Procedure: UPPER EXTREMITY VENOGRAPHY;  Surgeon: Katha Cabal, MD;  Location: [place] [facility];  Service: Cardiovascular;  Laterality: N/A;     Family History: Family History  Problem Relation Age of Onset  . Heart disease Mother   . Kidney disease Paternal Uncle      Social History: Social History   Socioeconomic History  . Marital status: Single    Spouse name: Not on file  . Number of children: Not on file  . Years of education: Not on file  . Highest education level: Not on file  Occupational History  . Not on file  Social Needs  . Financial resource strain: Not on file  . Food insecurity:    Worry: Not on file    Inability: Not on file  . Transportation needs:    Medical: Not on file    Non-medical: Not on file  Tobacco Use  . Smoking status: Former Smoker    Types: Cigarettes  . Smokeless tobacco: Former Systems developer    Quit date: 2010  . Tobacco comment: did not inhale!!  Substance and Sexual Activity  . Alcohol use: No  . Drug use: No  . Sexual activity: Not Currently  Lifestyle  . Physical activity:    Days per week: Not on file    Minutes per session: Not on file  . Stress: Not on file  Relationships  . Social connections:    Talks on phone: Not on file    Gets together: Not on file    Attends religious service: Not on file    Active member of club or organization: Not on file    Attends meetings of clubs or organizations: Not on file    Relationship status: Not on file  . Intimate  partner violence:    Fear of current or ex partner: Not on file    Emotionally abused: Not on file    Physically abused: Not on file    Forced sexual activity: Not on file  Other Topics Concern  . Not on file  Social History Narrative  . Not on file     Review of Systems: Gen: No fevers or chills HEENT: No vision or hearing problems CV: No chest pain or shortness of breath at present Resp: No cough or sputum production GI: Vomiting at the time of admission.  No abdominal pain GU : No complaints MS: No complaints Derm:  No complaints Psych: No complaints Heme: No complaints Neuro: No complaints Endocrine no complaints  Vital Signs: Blood pressure 116/62, pulse 95, temperature 98.4 F (36.9 C), temperature source Oral, resp. rate (!) 23, height 5\' 8"  (1.727 m), weight 75.5 kg, SpO2 100 %.   Intake/Output Summary (Last 24 hours) at 09/03/2018 1739 Last data filed at 09/03/2018 1730 Gross per 24 hour  Intake 870 ml  Output 0 ml  Net 870 ml    Weight trends: [employer]   09/01/18 1500  Weight: 75.5 kg    Physical Exam: General:  No acute distress, laying in the bed  HEENT  anicteric, moist oral mucous membranes  Neck:  Supple  Lungs:  Normal breathing effort, clear to auscultation  Heart::  No rub or gallop  Abdomen:  Soft, nontender  Extremities:  No pitting edema  Neurologic:  Alert, oriented  Skin:  No acute rashes  Access:  AV fistula    Lab results: Basic Metabolic Panel: Recent Labs  Lab 08/30/18 1806 08/31/18 0404 09/01/18 1544  NA 134* 135 137  K 3.2* 3.8 3.4*  CL 94* 96* 94*  CO2 28 27 26   GLUCOSE 109* 88 122*  BUN 28* 43* 45*  CREATININE 4.85* 6.24* 5.01*  CALCIUM 8.2* 8.5* 8.0*    Liver Function Tests: Recent Labs  Lab 09/01/18 1544  AST 13*  ALT 10  ALKPHOS 87  BILITOT 0.5  PROT 6.3*  ALBUMIN 3.5   Recent Labs  Lab 09/01/18 1544  LIPASE 39   No results for input(s): AMMONIA in the last 168 hours.  CBC: Recent  Labs  Lab 09/01/18 1544  09/02/18 1020 09/03/18 1024 09/03/18 1316  WBC 6.1  --  8.1 5.8  --   NEUTROABS 4.5  --   --   --   --   HGB 7.2*   < > 7.0* 5.8* 6.1*  HCT 21.1*  --  20.4* 17.5* 18.3*  MCV 93.4  --  93.2 94.6  --   PLT 184  --  169 162  --    < > = values in this interval not displayed.    Cardiac Enzymes:  Recent Labs  Lab 08/30/18 1806  TROPONINI 0.03*    BNP: Invalid input(s): POCBNP  CBG: No results for input(s): GLUCAP in the last 168 hours.  Microbiology: Recent Results (from the past 720 hour(s))  MRSA PCR Screening     Status: None   Collection Time: 08/30/18  9:15 PM  Result Value Ref Range Status   MRSA by PCR NEGATIVE NEGATIVE Final    Comment:        The GeneXpert MRSA Assay (FDA approved for NASAL specimens only), is one component of a comprehensive MRSA colonization surveillance program. It is not intended to diagnose MRSA infection nor to guide or monitor treatment for MRSA infections. Performed at [facility], [address] [place], [place] [address]      Coagulation Studies: No results for input(s): LABPROT, INR in the last 72 hours.  Urinalysis: No results for input(s): COLORURINE, LABSPEC, PHURINE, GLUCOSEU, HGBUR, BILIRUBINUR, KETONESUR, PROTEINUR, UROBILINOGEN, NITRITE, LEUKOCYTESUR in the last 72 hours.  Invalid input(s): APPERANCEUR      Imaging:  No results found.   Assessment & Plan: Pt is a 62 y.o. Caucasian  male with end-stage renal disease on dialysis for for 5 years, coronary disease, history of seizures, was admitted on 09/01/2018.   [facility]/MWF  1.  Anemia of chronic kidney disease and GI blood loss 2.  End-stage renal disease  3.  Secondary hyperparathyroidism  Plan: Work-up for anemia is in progress Hemodialysis to be scheduled today.  No heparin during dialysis Epogen with hemodialysis Hold binders until GI work-up is complete Blood transfusion to be given  during dialysis.  Discussed with internal medicine team and dialysis nurse. We will follow    LOS: 0 Lacey Wallman Candiss Norse 10/18/20195:39 PM  [place], [place]  Note: This note was prepared with Dragon dictation. Any transcription errors are unintentional

## 2018-09-03 NOTE — Progress Notes (Signed)
This note also relates to the following rows which could not be included: Pulse Rate - Cannot attach notes to unvalidated device data Resp - Cannot attach notes to unvalidated device data  Hd started  

## 2018-09-03 NOTE — Progress Notes (Signed)
[facility] [place] NAME: Jeremy Rose    MR#:  [id number]  DATE OF BIRTH:  12-26-1955  Seen at bedside, patient denies any black stools since admission, received 1 unit of transfusion on admission, patient hemoglobin dropped to 5.8 today, hemoglobin 7.2 on admission, received 1 unit of packed RBC transfusion, today hemoglobin 5.8.patient denies any complaints  CHIEF COMPLAINT:   Chief Complaint  Patient presents with  . Nausea  . Emesis    REVIEW OF SYSTEMS:    Review of Systems  Constitutional: Negative for chills and fever.  HENT: Negative for hearing loss.   Eyes: Negative for blurred vision, double vision and photophobia.  Respiratory: Negative for cough, hemoptysis and shortness of breath.   Cardiovascular: Negative for palpitations, orthopnea and leg swelling.  Gastrointestinal: Negative for abdominal pain, diarrhea and vomiting.  Genitourinary: Negative for dysuria and urgency.  Musculoskeletal: Negative for myalgias and neck pain.  Skin: Negative for rash.  Neurological: Negative for dizziness, focal weakness, seizures, weakness and headaches.  Psychiatric/Behavioral: Negative for memory loss. The patient does not have insomnia.   Appears pale  Nutrition:  Tolerating Diet: Tolerating PT:      DRUG ALLERGIES:   Allergies  Allergen Reactions  . Codeine Nausea And Vomiting  . Doxycycline Nausea And Vomiting    VITALS:  Blood pressure 133/80, pulse 77, temperature 98.5 F (36.9 C), temperature source Oral, resp. rate 20, height 5\' 8"  (1.727 m), weight 75.5 kg, SpO2 98 %.  PHYSICAL EXAMINATION:   Physical Exam  GENERAL:  62 y.o.-year-old patient lying in the bed with no acute distress.  Appears pale EYES: Pupils equal, round, reactive to light  . No scleral icterus. Extraocular muscles intact.  HEENT: Head atraumatic, normocephalic. Oropharynx and nasopharynx clear.  NECK:  Supple, no jugular venous  distention. No thyroid enlargement, no tenderness.  LUNGS: Normal breath sounds bilaterally, no wheezing, rales,rhonchi or crepitation. No use of accessory muscles of respiration.  CARDIOVASCULAR: S1, S2 normal. No murmurs, rubs, or gallops.  ABDOMEN: Soft, nontender, nondistended. Bowel sounds present. No organomegaly or mass.  EXTREMITIES: No pedal edema, cyanosis, or clubbing.  NEUROLOGIC: Cranial nerves II through XII are intact. Muscle strength 5/5 in all extremities. Sensation intact. Gait not checked.  PSYCHIATRIC: The patient is alert and oriented x 3.  SKIN: No obvious rash, lesion, or ulcer.    LABORATORY PANEL:   CBC Recent Labs  Lab 09/03/18 1024  WBC 5.8  HGB 5.8*  HCT 17.5*  PLT 162   ------------------------------------------------------------------------------------------------------------------  Chemistries  Recent Labs  Lab 09/01/18 1544  NA 137  K 3.4*  CL 94*  CO2 26  GLUCOSE 122*  BUN 45*  CREATININE 5.01*  CALCIUM 8.0*  AST 13*  ALT 10  ALKPHOS 87  BILITOT 0.5   ------------------------------------------------------------------------------------------------------------------  Cardiac Enzymes Recent Labs  Lab 08/30/18 1806  TROPONINI 0.03*   ------------------------------------------------------------------------------------------------------------------  RADIOLOGY:  No results found.   ASSESSMENT AND PLAN:   Active Problems:   Anemia   #1. symptomatic anemia, status post EGD 2 days ago showed gastritis, duodenitis, discharged home with Protonix but patient felt weak and passed out at dialysis y since hemoglobin at the time of discharge 2 days ago was 62.8 but now 7.2 yesterday received 1 unit of packed RBC because of symptoms, hemoglobin today is 5.8, transfuse 2 unit of packed RBC with dialysis today, consult GI, patient may need colonoscopy, spoke with Dr. Lucilla Lame from gastroenterology he will  see the patient.   ESRD:  Hemodialysis on Monday, Wednesday, Friday. Blood transfusion with hemodialysis today.  Syncope due to symptomatic anemia, status post blood transfusion,  Patient is inpatient stay because of symptomatic anemia with continued drop in hemoglobin requiring blood transfusions, GI evaluation again.  50% time spent in counseling, coordination of care  All the records are reviewed and case discussed with Care Management/Social Workerr. Management plans discussed with the patient, family and they are in agreement.  CODE STATUS: Full code  TOTAL TIME TAKING CARE OF THIS PATIENT: 40 minutes.   POSSIBLE D/C IN 1-2 DAYS, DEPENDING ON CLINICAL CONDITION.   Epifanio Lesches M.D on 09/03/2018 at 10:51 AM  Between 7am to 6pm - Pager - [phone]  After 6pm go to [email or web address] - password [email or web address] [facility]  [facility]  Office  [phone]  CC: Primary care physician; Freddy Finner, NP

## 2018-09-03 NOTE — Progress Notes (Signed)
2 units of blood given to dialysis nurse to be transfused during dialysis treatment.   Marene Gilliam [employer]

## 2018-09-03 NOTE — Progress Notes (Signed)
CBC stat ordered just to make sure, if patient hemoglobin is stable patient can be discharged home, can go for dialysis as an outpatient.  If not we will consult gastroenterology, nephrology.  I spoke with Dr. Murlean Iba.

## 2018-09-03 NOTE — Progress Notes (Signed)
Post HD Assessment   09/03/18 1950  Neurological  Level of Consciousness Alert  Orientation Level Oriented X4  Respiratory  Respiratory Pattern Regular;Unlabored;Symmetrical  Chest Assessment Chest expansion symmetrical  Bilateral Breath Sounds Clear  Cardiac  Pulse Regular  Heart Sounds S1, S2  Jugular Venous Distention (JVD) Yes  ECG Monitor Yes  Cardiac Rhythm NSR  Antiarrhythmic device No  Vascular  R Radial Pulse +2  L Radial Pulse +2  Integumentary  Integumentary (WDL) WDL  Skin Color Pale  Skin Condition Dry  Skin Integrity Other (Comment) (callus on great toes bilateral )  Musculoskeletal  Musculoskeletal (WDL) X  Generalized Weakness Yes  Gastrointestinal  Bowel Sounds Assessment Active  GU Assessment  Genitourinary (WDL) X  Genitourinary Symptoms Oliguria (HD pt)  Psychosocial  Psychosocial (WDL) WDL

## 2018-09-03 NOTE — Care Management (Signed)
Patient admitted from home with anemia.  Patient lives at home alone.  Patient relies on neighbor and sister for transportation to run errands. Patient utilizes [facility] for transportation to appointments and HD.  Elvera Bicker dialysis liaison aware of admission.  PCP Elsworth Soho.  Patient denies issues obtaining medications.  Patient states that at baseline he does not use any medical equipment.  No home health services.  Per nursing staff patient is mobile.  After hgb stabilizes nursing staff to ambulate.

## 2018-09-04 DIAGNOSIS — D62 Acute posthemorrhagic anemia: Secondary | ICD-10-CM

## 2018-09-04 DIAGNOSIS — K921 Melena: Secondary | ICD-10-CM

## 2018-09-04 LAB — PREPARE RBC (CROSSMATCH)

## 2018-09-04 LAB — HEMOGLOBIN: Hemoglobin: 7.2 g/dL — ABNORMAL LOW (ref 13.0–17.0)

## 2018-09-04 MED ORDER — PEG 3350-KCL-NA BICARB-NACL 420 G PO SOLR
4000.0000 mL | Freq: Once | ORAL | Status: AC
  Administered 2018-09-04: 4000 mL via ORAL
  Filled 2018-09-04: qty 4000

## 2018-09-04 MED ORDER — BISACODYL 5 MG PO TBEC
10.0000 mg | DELAYED_RELEASE_TABLET | Freq: Once | ORAL | Status: AC
  Administered 2018-09-04: 10 mg via ORAL
  Filled 2018-09-04: qty 2

## 2018-09-04 MED ORDER — ACETAMINOPHEN 325 MG PO TABS
650.0000 mg | ORAL_TABLET | Freq: Once | ORAL | Status: AC
  Administered 2018-09-04: 650 mg via ORAL
  Filled 2018-09-04: qty 2

## 2018-09-04 MED ORDER — SODIUM CHLORIDE 0.9% IV SOLUTION
Freq: Once | INTRAVENOUS | Status: AC
  Administered 2018-09-04: 18:00:00 via INTRAVENOUS

## 2018-09-04 MED ORDER — TAMSULOSIN HCL 0.4 MG PO CAPS
0.4000 mg | ORAL_CAPSULE | Freq: Every day | ORAL | Status: DC
  Administered 2018-09-04 – 2018-09-08 (×5): 0.4 mg via ORAL
  Filled 2018-09-04 (×5): qty 1

## 2018-09-04 NOTE — Progress Notes (Signed)
Go Lytely started.

## 2018-09-04 NOTE — Consult Note (Signed)
[name], MD [address] [place], [place], [place], [address] [address], [place], [place], [place], [address] Phone: [phone]  Fax: [phone]  Consultation  Referring Provider:     Dr. Bridgett Larsson Primary Care Physician:  Freddy Finner, NP Reason for Consultation:     Anemia  Date of Admission:  09/01/2018 Date of Consultation:  09/04/2018         HPI:   Jeremy Rose is a 62 y.o. male recently discharged on August 31, 2018, when he presented with GI bleed, and melena.  He underwent EGD on that admission with Dr. Allen Norris on October 15 no reported a small hiatal hernia, gastritis and duodenitis.  No biopsies done.  PPI was recommended.  He was readmitted from his dialysis center due lightheadedness, anemia, and melena.  Patient reports dark black stool for the last 2 to 3 days.  Denies any abdominal pain.  Denies any nausea vomiting.  No hematemesis.  No hematochezia.  He is on aspirin daily at home, denies any other NSAID use.  Besides EGD mentioned above, he had a colonoscopy 2010 for screening, fair prep, extent of exam cecum, and the exam was otherwise normal.  Past Medical History:  Diagnosis Date  . Arthritis    hands  . Asthma    uses inhaler  . Coronary artery disease   . Dialysis patient [facility]) 2012   Monday, Wednesday, Friday  . GERD (gastroesophageal reflux disease)   . Gout of right hand   . Hypertension   . Renal disorder    stage V..on dialysis  . Seizures ([place]) 2019   last seizure 5 years ago. had 20 in one day    Past Surgical History:  Procedure Laterality Date  . A/V FISTULAGRAM Left 02/24/2017   Procedure: A/V Fistulagram;  Surgeon: Katha Cabal, MD;  Location: [facility];  Service: Cardiovascular;  Laterality: Left;  . A/V SHUNT INTERVENTION N/A 02/24/2017   Procedure: A/V Shunt Intervention;  Surgeon: Katha Cabal, MD;  Location: [place] [facility];  Service: Cardiovascular;  Laterality: N/A;  . AV FISTULA  PLACEMENT Left   . AV FISTULA PLACEMENT Left 12/02/2017   Procedure: ARTERIOVENOUS (AV) FISTULA CREATION ( BRACHIAL CEPHALIC );  Surgeon: Katha Cabal, MD;  Location: [facility];  Service: Vascular;  Laterality: Left;  . CARDIAC CATHETERIZATION  2012   no stents  . DIALYSIS/PERMA CATHETER REMOVAL N/A 08/24/2018   Procedure: DIALYSIS/PERMA CATHETER REMOVAL;  Surgeon: Katha Cabal, MD;  Location: [facility];  Service: Cardiovascular;  Laterality: N/A;  . ESOPHAGOGASTRODUODENOSCOPY (EGD) WITH PROPOFOL N/A 08/31/2018   Procedure: ESOPHAGOGASTRODUODENOSCOPY (EGD) WITH PROPOFOL;  Surgeon: Lucilla Lame, MD;  Location: [facility];  Service: Endoscopy;  Laterality: N/A;  . INSERTION OF DIALYSIS CATHETER Left 05/28/2018   Procedure: INSERTION OF DIALYSIS CATHETER ( TUNNELED CATH INSERT );  Surgeon: Katha Cabal, MD;  Location: [facility];  Service: Vascular;  Laterality: Left;  . LIGATION OF ARTERIOVENOUS  FISTULA Left 12/02/2017   Procedure: LIGATION OF ARTERIOVENOUS  FISTULA ( REMOVAL LEFT WRIST FISTULA );  Surgeon: Katha Cabal, MD;  Location: [facility];  Service: Vascular;  Laterality: Left;  . PERIPHERAL VASCULAR CATHETERIZATION Left 06/03/2016   Procedure: A/V Shuntogram/Fistulagram;  Surgeon: Katha Cabal, MD;  Location: [facility];  Service: Cardiovascular;  Laterality: Left;  . PERIPHERAL VASCULAR CATHETERIZATION  06/03/2016   Procedure: Peripheral Vascular Balloon Angioplasty;  Surgeon: Katha Cabal, MD;  Location:  [facility];  Service: Cardiovascular;;  . REVISON OF ARTERIOVENOUS FISTULA Left 05/28/2018   Procedure: REVISON OF ARTERIOVENOUS FISTULA ( REMOVE ANEURYSM );  Surgeon: Katha Cabal, MD;  Location: [facility];  Service: Vascular;  Laterality: Left;  . UPPER EXTREMITY VENOGRAPHY N/A 04/13/2018   Procedure: UPPER EXTREMITY VENOGRAPHY;  Surgeon: Katha Cabal, MD;  Location: [facility];  Service: Cardiovascular;   Laterality: N/A;    Prior to Admission medications   Medication Sig Start Date End Date Taking? Authorizing Provider  acetaminophen (TYLENOL) 500 MG tablet Take 1 tablet (500 mg total) by mouth every 6 (six) hours as needed for mild pain. 08/31/18  Yes Salary, Avel Peace, MD  albuterol (PROVENTIL HFA;VENTOLIN HFA) 108 (90 BASE) MCG/ACT inhaler Inhale into the lungs every 6 (six) hours as needed for wheezing or shortness of breath.   Yes [provider]  allopurinol (ZYLOPRIM) 100 MG tablet Take 200 mg by mouth daily.   Yes [provider]  aspirin EC 81 MG tablet Take 81 mg by mouth daily.   Yes [provider]  beclomethasone (QVAR) 80 MCG/ACT inhaler Inhale 1 puff into the lungs as needed (for shortness of breath).   Yes [provider]  carvedilol (COREG CR) 10 MG 24 hr capsule Take 10 mg by mouth daily.   Yes [provider]  cinacalcet (SENSIPAR) 60 MG tablet Take 120 mg by mouth daily.    Yes [provider]  cyanocobalamin 1000 MCG tablet Take 1,000 mcg daily by mouth.   Yes [provider]  felodipine (PLENDIL) 2.5 MG 24 hr tablet Take 2.5 mg by mouth daily.    Yes [provider]  Lacosamide (VIMPAT) 150 MG TABS Take 150 mg by mouth 2 (two) times daily. Take an 3rd dose after dialysis on M-W-F   Yes [provider]  levETIRAcetam (KEPPRA) 500 MG tablet Take 500 mg by mouth 2 (two) times daily. Take a 3rd dose of 500 mgs after dialysis only on dialysis days (M-W-F)   Yes [provider]  pantoprazole (PROTONIX) 40 MG tablet Take 1 tablet (40 mg total) by mouth 2 (two) times daily. 08/31/18  Yes Salary, Avel Peace, MD  sevelamer carbonate (RENVELA) 800 MG tablet Take 2,400 mg by mouth 3 (three) times daily with meals.    Yes [provider]  oxyCODONE-acetaminophen (PERCOCET) 5-325 MG tablet Take 1 tablet by mouth every 4 (four) hours as needed for severe pain. Patient not taking: Reported on  08/30/2018 05/28/18 05/28/19  Schnier, Dolores Lory, MD    Family History  Problem Relation Age of Onset  . Heart disease Mother   . Kidney disease Paternal Uncle      Social History   Tobacco Use  . Smoking status: Former Smoker    Types: Cigarettes  . Smokeless tobacco: Former Systems developer    Quit date: 2010  . Tobacco comment: did not inhale!!  Substance Use Topics  . Alcohol use: No  . Drug use: No    Allergies as of 09/01/2018 - Review Complete 09/01/2018  Allergen Reaction Noted  . Codeine Nausea And Vomiting 10/25/2014  . Doxycycline Nausea And Vomiting 04/19/2013    Review of Systems:    All systems reviewed and negative except where noted in HPI.   Physical Exam:  Vital signs in last 24 hours: Vitals:   09/03/18 1930 09/03/18 1945 09/03/18 2020 09/04/18 0448  BP: 121/62 122/65 120/77 113/72  Pulse: 100 (!) 101 99  81  Resp: 19 20 16 20   Temp:   98.2 F (36.8 C) 98.2 F (36.8 C)  TempSrc:   Oral Oral  SpO2: 98% 98% 97% 96%  Weight:  73.6 kg    Height:       Last BM Date: 09/03/18 General:   Pleasant, cooperative in NAD Head:  Normocephalic and atraumatic. Eyes:   No icterus.   Conjunctiva pink. PERRLA. Ears:  Normal auditory acuity. Neck:  Supple; no masses or thyroidomegaly Lungs: Respirations even and unlabored. Lungs clear to auscultation bilaterally.   No wheezes, crackles, or rhonchi.  Abdomen:  Soft, nondistended, nontender. Normal bowel sounds. No appreciable masses or hepatomegaly.  No rebound or guarding.  Neurologic:  Alert and oriented x3;  grossly normal neurologically. Skin:  Intact without significant lesions or rashes. Cervical Nodes:  No significant cervical adenopathy. Psych:  Alert and cooperative. Normal affect.  LAB RESULTS: Recent Labs    09/02/18 1020 09/03/18 1024 09/03/18 1316 09/03/18 2058  WBC 8.1 5.8  --  5.2  HGB 7.0* 5.8* 6.1* 7.9*  HCT 20.4* 17.5* 18.3* 22.4*  PLT 169 162  --  167   BMET Recent Labs    09/01/18 1544    NA 137  K 3.4*  CL 94*  CO2 26  GLUCOSE 122*  BUN 45*  CREATININE 5.01*  CALCIUM 8.0*   LFT Recent Labs    09/01/18 1544  PROT 6.3*  ALBUMIN 3.5  AST 13*  ALT 10  ALKPHOS 87  BILITOT 0.5  BILIDIR <0.1  IBILI NOT CALCULATED   PT/INR No results for input(s): LABPROT, INR in the last 72 hours.  STUDIES: No results found.    Impression / Plan:   SAKIB NOGUEZ is a 62 y.o. y/o male with recent EGD 4 days ago for melena and anemia, showing duodenitis, gastritis, but no specific source of GI bleeding, readmitted with anemia and melena  patient is hemodynamically stable at this time Patient has not had a colonoscopy in 9 years and EGD did not show any source of GI bleeding, or source of his melena Therefore, colonoscopy is indicated to evaluate for source of anemia.  Right-sided colon Lesions can also cause melena. However, will also repeat EGD with his colonoscopy to rule out to look for lesions However, given his hemodynamic stability, and recent EGD, upper GI source is less likely at this time  If EGD and colonoscopy are negative, patient will need small bowel capsule on this admission  PPI IV twice daily  Continue serial CBCs and transfuse PRN Avoid NSAIDs Maintain 2 large-bore IV lines Please page GI with any acute hemodynamic changes, or signs of active GI bleeding  Patient was placed on solid food diet by primary team, and had a bag of chips this morning.  Therefore, EGD unable to be done today due to solid food intake.  However, if he has active GI bleeding, acute hemodynamic changes, please page GI, as EGD with intubation may need to be considered at that time  I have discussed alternative options, risks & benefits,  which include, but are not limited to, bleeding, infection, perforation,respiratory complication & drug reaction.  The patient agrees with this plan & written consent will be obtained.     Thank you for involving me in the care of this patient.       LOS: 1 day   Virgel Manifold, MD  09/04/2018, 8:40 AM

## 2018-09-04 NOTE — Progress Notes (Signed)
[facility]  ROUNDING NOTE   Subjective:   Hemodialysis treatment yesterday. Tolerated treatment well. No UF.   Complains of urinary urgency.   Objective:  Vital signs in last 24 hours:  Temp:  [97.7 F (36.5 C)-98.5 F (36.9 C)] 97.7 F (36.5 C) (10/19 1158) Pulse Rate:  [78-102] 78 (10/19 1158) Resp:  [15-25] 20 (10/19 0448) BP: (113-139)/(60-87) 124/87 (10/19 1158) SpO2:  [96 %-100 %] 99 % (10/19 1158) Weight:  [73.6 kg] 73.6 kg (10/18 1945)  Weight change:  Filed Weights   09/01/18 1500 09/03/18 1945  Weight: 75.5 kg 73.6 kg    Intake/Output: I/O last 3 completed shifts: In: 940 [P.O.:240; Blood:700] Out: 0    Intake/Output this shift:  No intake/output data recorded.  Physical Exam: General: NAD,   Head: Normocephalic, atraumatic. Moist oral mucosal membranes  Eyes: Anicteric, PERRL  Neck: Supple, trachea midline  Lungs:  Clear to auscultation  Heart: Regular rate and rhythm  Abdomen:  Soft, nontender,   Extremities: no peripheral edema.  Neurologic: Nonfocal, moving all four extremities  Skin: No lesions  Access: Left AVF    Basic Metabolic Panel: Recent Labs  Lab 08/30/18 1806 08/31/18 0404 09/01/18 1544  NA 134* 135 137  K 3.2* 3.8 3.4*  CL 94* 96* 94*  CO2 28 27 26   GLUCOSE 109* 88 122*  BUN 28* 43* 45*  CREATININE 4.85* 6.24* 5.01*  CALCIUM 8.2* 8.5* 8.0*    Liver Function Tests: Recent Labs  Lab 09/01/18 1544  AST 13*  ALT 10  ALKPHOS 87  BILITOT 0.5  PROT 6.3*  ALBUMIN 3.5   Recent Labs  Lab 09/01/18 1544  LIPASE 39   No results for input(s): AMMONIA in the last 168 hours.  CBC: Recent Labs  Lab 08/30/18 1806  09/01/18 1544  09/02/18 1020 09/03/18 1024 09/03/18 1316 09/03/18 2058 09/04/18 1104  WBC 6.3  --  6.1  --  8.1 5.8  --  5.2  --   NEUTROABS  --   --  4.5  --   --   --   --   --   --   HGB 7.6*   < > 7.2*   < > 7.0* 5.8* 6.1* 7.9* 7.2*  HCT 22.8*  --  21.1*  --  20.4* 17.5* 18.3* 22.4*  --    MCV 95.4  --  93.4  --  93.2 94.6  --  90.0  --   PLT 207  --  184  --  169 162  --  167  --    < > = values in this interval not displayed.    Cardiac Enzymes: Recent Labs  Lab 08/30/18 1806  TROPONINI 0.03*    BNP: Invalid input(s): POCBNP  CBG: No results for input(s): GLUCAP in the last 168 hours.  Microbiology: Results for orders placed or performed during the hospital encounter of 08/30/18  MRSA PCR Screening     Status: None   Collection Time: 08/30/18  9:15 PM  Result Value Ref Range Status   MRSA by PCR NEGATIVE NEGATIVE Final    Comment:        The GeneXpert MRSA Assay (FDA approved for NASAL specimens only), is one component of a comprehensive MRSA colonization surveillance program. It is not intended to diagnose MRSA infection nor to guide or monitor treatment for MRSA infections. Performed at [facility], [address] [place], [place] [address]     Coagulation Studies: No results for  input(s): LABPROT, INR in the last 72 hours.  Urinalysis: No results for input(s): COLORURINE, LABSPEC, PHURINE, GLUCOSEU, HGBUR, BILIRUBINUR, KETONESUR, PROTEINUR, UROBILINOGEN, NITRITE, LEUKOCYTESUR in the last 72 hours.  Invalid input(s): APPERANCEUR    Imaging: No results found.   Medications:   . sodium chloride    . sodium chloride     . sodium chloride   Intravenous Once  . allopurinol  200 mg Oral Daily  . bisacodyl  10 mg Oral Once  . Chlorhexidine Gluconate Cloth  6 each Topical Q0600  . cinacalcet  120 mg Oral Q breakfast  . lacosamide  150 mg Oral BID  . lacosamide  150 mg Oral Q M,W,F-HD  . levETIRAcetam  500 mg Oral BID  . levETIRAcetam  500 mg Oral Q M,W,F-HD  . pantoprazole (PROTONIX) IV  40 mg Intravenous Q12H  . polyethylene glycol-electrolytes  4,000 mL Oral Once  . sevelamer carbonate  2,400 mg Oral TID WC  . sodium chloride flush  3 mL Intravenous Q12H  . cyanocobalamin  1,000 mcg Oral Daily   sodium chloride,  acetaminophen **OR** acetaminophen, albuterol, bisacodyl, budesonide (PULMICORT) nebulizer solution, ondansetron **OR** ondansetron (ZOFRAN) IV, senna-docusate, sodium chloride flush  Assessment/ Plan:  [name] is a 62 y.o. white male with end-stage renal disease on dialysis for for 5 years, coronary disease, history of seizures, was admitted on 09/01/2018 for Syncope, unspecified syncope type [R55] Anemia, unspecified type [D64.9] Non-intractable vomiting with nausea, unspecified vomiting type [R11.2]  [facility]/MWF  1. End Stage Renal Disease: tolerated hemodialysis treatment yesterday.  Continue MWF schedule.  UF of 0  2. Anemia of chronic kidney disease and GI blood loss: with melana for last few days.  2 units PRBC on admission Appreciate GI input.   3. Secondary Hyperparathyroidism:  Restart sevelamer with meals.  - cinacalcet   4. Hypertension: blood pressure at goal.   5. Urinary Hesitancy/Urgency - Trial of tamsulosin.    LOS: 1 Kveon Casanas 10/19/201912:06 PM

## 2018-09-04 NOTE — Progress Notes (Addendum)
[facility] [place] NAME: Jeremy Rose    MR#:  [id number]  DATE OF BIRTH:  04/06/1956  Seen at bedside, patient denies any black stools since admission, received 1 unit   CHIEF COMPLAINT:   Chief Complaint  Patient presents with  . Nausea  . Emesis   Melena per patient.  Hemoglobin increased to 7.9 after PRBC transfusion but decreased to 7.2. REVIEW OF SYSTEMS:    Review of Systems  Constitutional: Positive for malaise/fatigue. Negative for chills and fever.  HENT: Negative for hearing loss.   Eyes: Negative for blurred vision, double vision and photophobia.  Respiratory: Negative for cough, hemoptysis and shortness of breath.   Cardiovascular: Negative for palpitations, orthopnea and leg swelling.  Gastrointestinal: Positive for melena. Negative for abdominal pain, blood in stool, diarrhea, nausea and vomiting.  Genitourinary: Negative for dysuria and urgency.  Musculoskeletal: Negative for myalgias and neck pain.  Skin: Negative for rash.  Neurological: Negative for dizziness, focal weakness, seizures, weakness and headaches.  Psychiatric/Behavioral: Negative for memory loss. The patient does not have insomnia.   Appears pale  Nutrition:  Tolerating Diet: Tolerating PT:  DRUG ALLERGIES:   Allergies  Allergen Reactions  . Codeine Nausea And Vomiting  . Doxycycline Nausea And Vomiting    VITALS:  Blood pressure 124/87, pulse 78, temperature 97.7 F (36.5 C), temperature source Oral, resp. rate 20, height 5\' 8"  (1.727 m), weight 73.6 kg, SpO2 99 %.  PHYSICAL EXAMINATION:   Physical Exam  GENERAL:  62 y.o.-year-old patient lying in the bed with no acute distress.  Appears pale EYES: Pupils equal, round, reactive to light  . No scleral icterus. Extraocular muscles intact.  HEENT: Head atraumatic, normocephalic. Oropharynx and nasopharynx clear.  NECK:  Supple, no jugular venous distention. No thyroid  enlargement, no tenderness.  LUNGS: Normal breath sounds bilaterally, no wheezing, rales,rhonchi or crepitation. No use of accessory muscles of respiration.  CARDIOVASCULAR: S1, S2 normal. No murmurs, rubs, or gallops.  ABDOMEN: Soft, nontender, nondistended. Bowel sounds present. No organomegaly or mass.  EXTREMITIES: No pedal edema, cyanosis, or clubbing.  NEUROLOGIC: Cranial nerves II through XII are intact. Muscle strength 5/5 in all extremities. Sensation intact. Gait not checked.  PSYCHIATRIC: The patient is alert and oriented x 3.  SKIN: No obvious rash, lesion, or ulcer.    LABORATORY PANEL:   CBC Recent Labs  Lab 09/03/18 2058 09/04/18 1104  WBC 5.2  --   HGB 7.9* 7.2*  HCT 22.4*  --   PLT 167  --    ------------------------------------------------------------------------------------------------------------------  Chemistries  Recent Labs  Lab 09/01/18 1544  NA 137  K 3.4*  CL 94*  CO2 26  GLUCOSE 122*  BUN 45*  CREATININE 5.01*  CALCIUM 8.0*  AST 13*  ALT 10  ALKPHOS 87  BILITOT 0.5   ------------------------------------------------------------------------------------------------------------------  Cardiac Enzymes Recent Labs  Lab 08/30/18 1806  TROPONINI 0.03*   ------------------------------------------------------------------------------------------------------------------  RADIOLOGY:  No results found.   ASSESSMENT AND PLAN:   Active Problems:   Anemia   #1. Symptomatic anemia due to GIB, status post EGD showing gastritis, duodenitis, discharged home with Protonix but patient felt weak and passed out at dialysis, hemoglobin down to 7.2 , s/p 1 unit of packed RBC because of symptoms, hemoglobin down to 5.8, transfused 2 unit of packed RBC with dialysis yesterday, up to 7.9 but down to 7,2 today.  We will give 1 more unit PRBC transfusion. EGD and colonoscopy tomorrow  per Dr. Bonna Gains.  ESRD: Hemodialysis on Monday, Wednesday,  Friday.  Syncope due to symptomatic anemia, status post blood transfusion,  Patient is inpatient stay because of symptomatic anemia with continued drop in hemoglobin requiring blood transfusions, GI evaluation again.  I discussed with Dr. Juleen China. 50% time spent in counseling, coordination of care  All the records are reviewed and case discussed with Care Management/Social Workerr. Management plans discussed with the patient, family and they are in agreement.  CODE STATUS: Full code  TOTAL TIME TAKING CARE OF THIS PATIENT: 37 minutes.   POSSIBLE D/C IN 2 DAYS, DEPENDING ON CLINICAL CONDITION.   Demetrios Loll M.D on 09/04/2018 at 3:34 PM  Between 7am to 6pm - Pager - [phone]  After 6pm go to [email or web address] - password [email or web address] [facility]  [place]  Office  [phone]  CC: Primary care physician; Freddy Finner, NP

## 2018-09-05 ENCOUNTER — Inpatient Hospital Stay: Payer: Medicare PPO | Admitting: Anesthesiology

## 2018-09-05 ENCOUNTER — Encounter: Payer: Self-pay | Admitting: Anesthesiology

## 2018-09-05 ENCOUNTER — Encounter
Admission: EM | Disposition: A | Discharge status: Other Acute Inpatient Hospital | Payer: Self-pay | Source: Home / Self Care | Attending: Internal Medicine

## 2018-09-05 ENCOUNTER — Inpatient Hospital Stay: Payer: Medicare PPO

## 2018-09-05 DIAGNOSIS — K3189 Other diseases of stomach and duodenum: Secondary | ICD-10-CM

## 2018-09-05 DIAGNOSIS — D500 Iron deficiency anemia secondary to blood loss (chronic): Secondary | ICD-10-CM

## 2018-09-05 DIAGNOSIS — K922 Gastrointestinal hemorrhage, unspecified: Principal | ICD-10-CM

## 2018-09-05 HISTORY — PX: COLONOSCOPY: SHX5424

## 2018-09-05 HISTORY — PX: ESOPHAGOGASTRODUODENOSCOPY: SHX5428

## 2018-09-05 LAB — BPAM RBC
BLOOD PRODUCT EXPIRATION DATE: 201910232359
BLOOD PRODUCT EXPIRATION DATE: 201911052359
Blood Product Expiration Date: 201911112359
Blood Product Expiration Date: 201911122359
ISSUE DATE / TIME: 201910181657
ISSUE DATE / TIME: 201910191803
ISSUE DATE / TIME: 201910161840
ISSUE DATE / TIME: 201910181749
UNIT TYPE AND RH: 9500
UNIT TYPE AND RH: 9500
Unit Type and Rh: 9500
Unit Type and Rh: 9500

## 2018-09-05 LAB — TYPE AND SCREEN
ABO/RH(D): O NEG
ANTIBODY SCREEN: NEGATIVE
UNIT DIVISION: 0
UNIT DIVISION: 0
UNIT DIVISION: 0
Unit division: 0

## 2018-09-05 LAB — HEMOGLOBIN
HEMOGLOBIN: 7.4 g/dL — AB (ref 13.0–17.0)
Hemoglobin: 7.5 g/dL — ABNORMAL LOW (ref 13.0–17.0)

## 2018-09-05 LAB — PREPARE RBC (CROSSMATCH)

## 2018-09-05 SURGERY — EGD (ESOPHAGOGASTRODUODENOSCOPY)
Anesthesia: General | Laterality: Left

## 2018-09-05 MED ORDER — SODIUM CHLORIDE 0.9 % IV SOLN
INTRAVENOUS | Status: DC
  Administered 2018-09-05: 09:00:00 via INTRAVENOUS

## 2018-09-05 MED ORDER — PROPOFOL 500 MG/50ML IV EMUL
INTRAVENOUS | Status: AC
  Filled 2018-09-05: qty 50

## 2018-09-05 MED ORDER — PROPOFOL 10 MG/ML IV BOLUS
INTRAVENOUS | Status: DC | PRN
  Administered 2018-09-05: 30 mg via INTRAVENOUS

## 2018-09-05 MED ORDER — PHENYLEPHRINE HCL 10 MG/ML IJ SOLN
INTRAMUSCULAR | Status: DC | PRN
  Administered 2018-09-05: 100 ug via INTRAVENOUS

## 2018-09-05 MED ORDER — PANTOPRAZOLE SODIUM 40 MG PO TBEC
40.0000 mg | DELAYED_RELEASE_TABLET | Freq: Every day | ORAL | Status: DC
  Administered 2018-09-06 – 2018-09-08 (×3): 40 mg via ORAL
  Filled 2018-09-05 (×3): qty 1

## 2018-09-05 MED ORDER — PROPOFOL 500 MG/50ML IV EMUL
INTRAVENOUS | Status: DC | PRN
  Administered 2018-09-05: 100 ug/kg/min via INTRAVENOUS

## 2018-09-05 MED ORDER — LIDOCAINE HCL 1 % IJ SOLN
INTRAMUSCULAR | Status: DC | PRN
  Administered 2018-09-05: 40 mg via INTRADERMAL

## 2018-09-05 MED ORDER — TECHNETIUM TC 99M-LABELED RED BLOOD CELLS IV KIT
20.0000 | PACK | Freq: Once | INTRAVENOUS | Status: AC | PRN
  Administered 2018-09-05: 22.868 via INTRAVENOUS

## 2018-09-05 NOTE — Anesthesia Postprocedure Evaluation (Signed)
Anesthesia Post Note  Patient: Jeremy Rose  Procedure(s) Performed: ESOPHAGOGASTRODUODENOSCOPY (EGD) (Left ) COLONOSCOPY (Left )  Patient location during evaluation: PACU Anesthesia Type: General Level of consciousness: awake and alert Pain management: pain level controlled Vital Signs Assessment: post-procedure vital signs reviewed and stable Respiratory status: spontaneous breathing, nonlabored ventilation, respiratory function stable and patient connected to nasal cannula oxygen Cardiovascular status: blood pressure returned to baseline and stable Postop Assessment: no apparent nausea or vomiting Anesthetic complications: no     Last Vitals:  Vitals:   09/05/18 1031 09/05/18 1046  BP: 132/71 109/61  Pulse: 70 79  Resp: 16 18  Temp:  (!) 36.4 C  SpO2: 100% 96%    Last Pain:  Vitals:   09/05/18 1046  TempSrc: Oral  PainSc:                  Durenda Hurt

## 2018-09-05 NOTE — Progress Notes (Signed)
[facility] [place] NAME: Jeremy Rose    MR#:  [id number]  DATE OF BIRTH:  Apr 17, 1956  Seen at bedside, patient denies any black stools since admission, received 1 unit   CHIEF COMPLAINT:   Chief Complaint  Patient presents with  . Nausea  . Emesis   No Melena per patient.  Hemoglobin 7.4, repeated Hb 7.5. REVIEW OF SYSTEMS:    Review of Systems  Constitutional: Positive for malaise/fatigue. Negative for chills and fever.  HENT: Negative for hearing loss.   Eyes: Negative for blurred vision, double vision and photophobia.  Respiratory: Negative for cough, hemoptysis and shortness of breath.   Cardiovascular: Negative for palpitations, orthopnea and leg swelling.  Gastrointestinal: Negative for abdominal pain, blood in stool, diarrhea, melena, nausea and vomiting.  Genitourinary: Negative for dysuria and urgency.  Musculoskeletal: Negative for myalgias and neck pain.  Skin: Negative for rash.  Neurological: Negative for dizziness, focal weakness, seizures, weakness and headaches.  Psychiatric/Behavioral: Negative for memory loss. The patient does not have insomnia.   Appears pale  Nutrition:  Tolerating Diet: Tolerating PT:  DRUG ALLERGIES:   Allergies  Allergen Reactions  . Codeine Nausea And Vomiting  . Doxycycline Nausea And Vomiting    VITALS:  Blood pressure 121/67, pulse 72, temperature (!) 97.3 F (36.3 C), temperature source Oral, resp. rate 14, height 5\' 8"  (1.727 m), weight 73.6 kg, SpO2 98 %.  PHYSICAL EXAMINATION:   Physical Exam  GENERAL:  62 y.o.-year-old patient lying in the bed with no acute distress.  Appears pale EYES: Pupils equal, round, reactive to light  . No scleral icterus. Extraocular muscles intact.  HEENT: Head atraumatic, normocephalic. Oropharynx and nasopharynx clear.  NECK:  Supple, no jugular venous distention. No thyroid enlargement, no tenderness.  LUNGS: Normal breath  sounds bilaterally, no wheezing, rales,rhonchi or crepitation. No use of accessory muscles of respiration.  CARDIOVASCULAR: S1, S2 normal. No murmurs, rubs, or gallops.  ABDOMEN: Soft, nontender, nondistended. Bowel sounds present. No organomegaly or mass.  EXTREMITIES: No pedal edema, cyanosis, or clubbing.  NEUROLOGIC: Cranial nerves II through XII are intact. Muscle strength 5/5 in all extremities. Sensation intact. Gait not checked.  PSYCHIATRIC: The patient is alert and oriented x 3.  SKIN: No obvious rash, lesion, or ulcer.    LABORATORY PANEL:   CBC Recent Labs  Lab 09/03/18 2058  09/05/18 1119  WBC 5.2  --   --   HGB 7.9*   < > 7.5*  HCT 22.4*  --   --   PLT 167  --   --    < > = values in this interval not displayed.   ------------------------------------------------------------------------------------------------------------------  Chemistries  Recent Labs  Lab 09/01/18 1544  NA 137  K 3.4*  CL 94*  CO2 26  GLUCOSE 122*  BUN 45*  CREATININE 5.01*  CALCIUM 8.0*  AST 13*  ALT 10  ALKPHOS 87  BILITOT 0.5   ------------------------------------------------------------------------------------------------------------------  Cardiac Enzymes Recent Labs  Lab 08/30/18 1806  TROPONINI 0.03*   ------------------------------------------------------------------------------------------------------------------  RADIOLOGY:  No results found.   ASSESSMENT AND PLAN:   Active Problems:   Anemia   #1. Symptomatic anemia due to GIB, status post EGD showing gastritis, duodenitis, discharged home with Protonix but patient felt weak and passed out at dialysis, hemoglobin down to 7.2 , s/p 1 unit of packed RBC because of symptoms, hemoglobin down to 5.8, transfused 2 unit of packed RBC with dialysis, up to 7.9  but down to 7.2,  given 1 more unit PRBC transfusion.  Hb 7.5. Per Dr. Bonna Gains, EGD: Erythematous mucosa in the antrum. Erythematous duodenopathy. Perform an  H. pylori serology. Change PPI to PO once daily for 30 days. Colonoscopy: Blood clots in colon suggest anemia and bleeding to                        be likely from the more proximal colon than the extent                        of the exam. That is, the cecum or proximal ascending                        colon, or from the small bowel.  Bleeding scan per Dr. Bonna Gains.  ESRD: Hemodialysis on Monday, Wednesday, Friday.  Syncope due to symptomatic anemia, status post blood transfusion,  Patient is inpatient stay because of symptomatic anemia with continued drop in hemoglobin requiring blood transfusions, GI evaluation again.  I discussed with Dr. Juleen China and Dr. Bonna Gains . 50% time spent in counseling, coordination of care All the records are reviewed and case discussed with Care Management/Social Workerr. Management plans discussed with the patient, family and they are in agreement.  CODE STATUS: Full code  TOTAL TIME TAKING CARE OF THIS PATIENT: 33 minutes.   POSSIBLE D/C IN 2 DAYS, DEPENDING ON CLINICAL CONDITION.   Demetrios Loll M.D on 09/05/2018 at 2:56 PM  Between 7am to 6pm - Pager - [phone]  After 6pm go to [email or web address] - password [email or web address] [facility]  [place] [facility]  Office  [phone]  CC: Primary care physician; Freddy Finner, NP

## 2018-09-05 NOTE — Op Note (Signed)
[facility] Gastroenterology Patient Name: Jeremy Rose Procedure Date: 09/05/2018 9:03 AM MRN: [id number] Account #: [id number] Date of Birth: 05/19/1956 Admit Type: Inpatient Age: 62 Room: [facility] ENDO ROOM 4 Gender: Male Note Status: Finalized Procedure:            Upper GI endoscopy Indications:          Iron deficiency anemia secondary to chronic blood loss Providers:            Byran Bilotti B. Bonna Gains MD, MD Referring MD:         Neomia Dear (Referring MD) Medicines:            Monitored Anesthesia Care Complications:        No immediate complications. Procedure:            Pre-Anesthesia Assessment:                       - Prior to the procedure, a History and Physical was                        performed, and patient medications, allergies and                        sensitivities were reviewed. The patient's tolerance of                        previous anesthesia was reviewed.                       - The risks and benefits of the procedure and the                        sedation options and risks were discussed with the                        patient. All questions were answered and informed                        consent was obtained.                       - Patient identification and proposed procedure were                        verified prior to the procedure by the physician, the                        nurse, the anesthesiologist, the anesthetist and the                        technician. The procedure was verified in the procedure                        room.                       - ASA Grade Assessment: III - A patient with severe                        systemic disease.  After obtaining informed consent, the endoscope was                        passed under direct vision. Throughout the procedure,                        the patient's blood pressure, pulse, and oxygen                        saturations were monitored  continuously. The Endoscope                        was introduced through the mouth, and advanced to the                        second part of duodenum. The upper GI endoscopy was                        accomplished with ease. The patient tolerated the                        procedure well. Findings:      The examined esophagus was normal.      Patchy mildly erythematous mucosa without bleeding was found in the       gastric antrum. Biopsies were not done due to patient's anemia.      Patchy mildly erythematous mucosa without active bleeding and with no       stigmata of bleeding was found in the duodenal bulb.      The second portion of the duodenum was normal. Impression:           - Normal esophagus.                       - Erythematous mucosa in the antrum.                       - Erythematous duodenopathy.                       - Normal second portion of the duodenum.                       - No specimens collected. Recommendation:       - Perform an H. pylori serology today.                       - Change PPI to PO once daily for 30 days                       - Continue Serial CBCs and transfuse PRN                       - Patient has a contact number available for                        emergencies. The signs and symptoms of potential                        delayed complications were discussed with the patient.  Return to normal activities tomorrow. Written discharge                        instructions were provided to the patient.                       - The findings and recommendations were discussed with                        the patient. Procedure Code(s):    --- Professional ---                       [phone], Esophagogastroduodenoscopy, flexible, transoral;                        diagnostic, including collection of specimen(s) by                        brushing or washing, when performed (separate procedure) Diagnosis Code(s):    --- Professional ---                        K31.89, Other diseases of stomach and duodenum                       D50.0, Iron deficiency anemia secondary to blood loss                        (chronic) CPT copyright 2018 American Medical Association. All rights reserved. The codes documented in this report are preliminary and upon coder review may  be revised to meet current compliance requirements.  [name], MD Margretta Sidle B. Bonna Gains MD, MD 09/05/2018 9:22:30 AM This report has been signed electronically. Number of Addenda: 0 Note Initiated On: 09/05/2018 9:03 AM      [facility]

## 2018-09-05 NOTE — Op Note (Signed)
[facility] Gastroenterology Patient Name: Jeremy Rose Procedure Date: 09/05/2018 9:03 AM MRN: [id number] Account #: [id number] Date of Birth: February 01, 1956 Admit Type: Inpatient Age: 62 Room: [facility] ENDO ROOM 4 Gender: Male Note Status: Finalized Procedure:            Colonoscopy Indications:          Iron deficiency anemia Providers:            Kurtis Anastasia B. Bonna Gains MD, MD Referring MD:         Neomia Dear (Referring MD) Medicines:            Monitored Anesthesia Care Complications:        No immediate complications. Procedure:            Pre-Anesthesia Assessment:                       - Prior to the procedure, a History and Physical was                        performed, and patient medications, allergies and                        sensitivities were reviewed. The patient's tolerance of                        previous anesthesia was reviewed.                       - The risks and benefits of the procedure and the                        sedation options and risks were discussed with the                        patient. All questions were answered and informed                        consent was obtained.                       - Patient identification and proposed procedure were                        verified prior to the procedure by the physician, the                        nurse, the anesthesiologist, the anesthetist and the                        technician. The procedure was verified in the                        pre-procedure area in the procedure room in the                        endoscopy suite.                       - Prophylactic Antibiotics: The patient does not  require prophylactic antibiotics.                       - ASA Grade Assessment: III - A patient with severe                        systemic disease.                       - After reviewing the risks and benefits, the patient                        was deemed in  satisfactory condition to undergo the                        procedure.                       - Monitored anesthesia care was determined to be                        medically necessary for this procedure based on review                        of the patient's medical history, medications, and                        prior anesthesia history.                       - The anesthesia plan was to use monitored anesthesia                        care (MAC).                       After obtaining informed consent, the colonoscope was                        passed under direct vision. Throughout the procedure,                        the patient's blood pressure, pulse, and oxygen                        saturations were monitored continuously. The                        Colonoscope was introduced through the anus and                        advanced to the the ascending colon. The colonoscopy                        was performed with ease. The patient tolerated the                        procedure well. The quality of the bowel preparation                        was fair. Findings:      The perianal and digital rectal examinations were normal.  2 blood clots was found in the ascending colon. There were no bleeding       lesions seen until the extent of the exam in the ascending colon.      The colon (entire examined portion) revealed significantly excessive       looping. Advancing the scope required changing the patient's position,       using manual pressure, withdrawing and reinserting the scope and       straightening and shortening the scope to obtain bowel loop reduction.       The patient was changed from left lateral position, to supine, to right       lateral position, and could not be placed prone as it was resulting in       hypoxia. The scope could not be advanced past the ascending colon       despite above maneuvers. The procedure took longer than normal, more       than 1hr 45  mins. The patient tolerated the procedure well without       complications.      The retroflexed view of the distal rectum and anal verge was normal and       showed no anal or rectal abnormalities. Impression:           - Preparation of the colon was fair.                       - Blood in the ascending colon.                       - There was significant looping of the colon.                       - No specimens collected.                       - Blood clots in colon suggest anemia and bleeding to                        be likely from the more proximal colon than the extent                        of the exam. That is, the cecum or proximal ascending                        colon, or from the small bowel. Recommendation:       - Do a GI bleeding (tagged RBC) scan today.                       - Continue Serial CBCs and transfuse PRN                       - Consult vascular surgery as source of bleeding unable                        to identified but likely proximal colon or the small                        bowel.                       - Avoid  NSAIDs except Aspirin if medically indicated                       - The findings and recommendations were discussed with                        the patient.                       - Return patient to hospital ward for ongoing care. Procedure Code(s):    --- Professional ---                       [phone], 58, Colonoscopy, flexible; diagnostic, including                        collection of specimen(s) by brushing or washing, when                        performed (separate procedure) Diagnosis Code(s):    --- Professional ---                       K92.2, Gastrointestinal hemorrhage, unspecified                       D50.9, Iron deficiency anemia, unspecified CPT copyright 2018 American Medical Association. All rights reserved. The codes documented in this report are preliminary and upon coder review may  be revised to meet current compliance  requirements.  [name], MD Margretta Sidle B. Bonna Gains MD, MD 09/05/2018 10:17:25 AM This report has been signed electronically. Number of Addenda: 0 Note Initiated On: 09/05/2018 9:03 AM Scope Withdrawal Time: 0 hours 1 minute 45 seconds  Total Procedure Duration: 0 hours 40 minutes 3 seconds  Estimated Blood Loss: Estimated blood loss: none.      [facility]

## 2018-09-05 NOTE — Anesthesia Post-op Follow-up Note (Signed)
Anesthesia QCDR form completed.        

## 2018-09-05 NOTE — Anesthesia Preprocedure Evaluation (Addendum)
Anesthesia Evaluation  Patient identified by MRN, date of birth, ID band Patient awake    Reviewed: Allergy & Precautions, H&P , NPO status , Patient's Chart, lab work & pertinent test results  Airway Mallampati: II  TM Distance: >3 FB Neck ROM: full    Dental  (+) Edentulous Lower, Edentulous Upper   Pulmonary asthma , neg sleep apnea, former smoker,    breath sounds clear to auscultation       Cardiovascular hypertension, (-) angina+ CAD  (-) Cardiac Stents and (-) CHF (-) dysrhythmias  Rhythm:regular Rate:Normal     Neuro/Psych Seizures - (pt is not sure if he's ever had a seizure), Well Controlled,  negative neurological ROS  negative psych ROS   GI/Hepatic Neg liver ROS, GERD  ,  Endo/Other  negative endocrine ROS  Renal/GU ESRF and DialysisRenal disease (last dialysis 10/18)  negative genitourinary   Musculoskeletal   Abdominal   Peds  Hematology  (+) Blood dyscrasia, anemia ,   Anesthesia Other Findings Past Medical History: No date: Arthritis     Comment:  hands No date: Asthma     Comment:  uses inhaler No date: Coronary artery disease 2012: Dialysis patient [facility])     Comment:  Monday, Wednesday, Friday No date: GERD (gastroesophageal reflux disease) No date: Gout of right hand No date: Hypertension No date: Renal disorder     Comment:  stage V..on dialysis 2019: Seizures ([place])     Comment:  last seizure 5 years ago. had 20 in one day   Reproductive/Obstetrics negative OB ROS                            Anesthesia Physical Anesthesia Plan  ASA: IV  Anesthesia Plan: General   Post-op Pain Management:    Induction:   PONV Risk Score and Plan: Propofol infusion  Airway Management Planned: Natural Airway and Nasal Cannula  Additional Equipment:   Intra-op Plan:   Post-operative Plan:   Informed Consent: I have reviewed the patients History and Physical, chart,  labs and discussed the procedure including the risks, benefits and alternatives for the proposed anesthesia with the patient or authorized representative who has indicated his/her understanding and acceptance.   Dental Advisory Given  Plan Discussed with: Anesthesiologist, CRNA and Surgeon  Anesthesia Plan Comments:         Anesthesia Quick Evaluation

## 2018-09-05 NOTE — OR Nursing (Signed)
Reached the ascending colon, unable to reach cecum after multiple repositioning of pt. From left sideo back and stomach.

## 2018-09-05 NOTE — Progress Notes (Signed)
[facility]  ROUNDING NOTE   Subjective:   Endoscopy this morning. Unable to locate source bleeding. Bleeding scan for later today.   In and Out catheterization 1000 mL.   Objective:  Vital signs in last 24 hours:  Temp:  [97.3 F (36.3 C)-98.1 F (36.7 C)] 97.3 F (36.3 C) (10/20 1224) Pulse Rate:  [70-90] 72 (10/20 1224) Resp:  [14-20] 14 (10/20 1224) BP: (109-161)/(61-86) 120/70 (10/20 1224) SpO2:  [94 %-100 %] 98 % (10/20 1224)  Weight change:  Filed Weights   09/01/18 1500 09/03/18 1945  Weight: 75.5 kg 73.6 kg    Intake/Output: I/O last 3 completed shifts: In: 552.3 [P.O.:240; I.V.:2.3; Blood:310] Out: 200 [Urine:200]   Intake/Output this shift:  Total I/O In: -  Out: 1000 [Urine:1000]  Physical Exam: General: NAD,   Head: Normocephalic, atraumatic. Moist oral mucosal membranes  Eyes: Anicteric, PERRL  Neck: Supple, trachea midline  Lungs:  Clear to auscultation  Heart: Regular rate and rhythm  Abdomen:  Soft, nontender,   Extremities: no peripheral edema.  Neurologic: Nonfocal, moving all four extremities  Skin: No lesions  Access: Left AVF    Basic Metabolic Panel: Recent Labs  Lab 08/30/18 1806 08/31/18 0404 09/01/18 1544  NA 134* 135 137  K 3.2* 3.8 3.4*  CL 94* 96* 94*  CO2 28 27 26   GLUCOSE 109* 88 122*  BUN 28* 43* 45*  CREATININE 4.85* 6.24* 5.01*  CALCIUM 8.2* 8.5* 8.0*    Liver Function Tests: Recent Labs  Lab 09/01/18 1544  AST 13*  ALT 10  ALKPHOS 87  BILITOT 0.5  PROT 6.3*  ALBUMIN 3.5   Recent Labs  Lab 09/01/18 1544  LIPASE 39   No results for input(s): AMMONIA in the last 168 hours.  CBC: Recent Labs  Lab 08/30/18 1806  09/01/18 1544  09/02/18 1020 09/03/18 1024 09/03/18 1316 09/03/18 2058 09/04/18 1104 09/05/18 0456 09/05/18 1119  WBC 6.3  --  6.1  --  8.1 5.8  --  5.2  --   --   --   NEUTROABS  --   --  4.5  --   --   --   --   --   --   --   --   HGB 7.6*   < > 7.2*   < > 7.0* 5.8*  6.1* 7.9* 7.2* 7.4* 7.5*  HCT 22.8*  --  21.1*  --  20.4* 17.5* 18.3* 22.4*  --   --   --   MCV 95.4  --  93.4  --  93.2 94.6  --  90.0  --   --   --   PLT 207  --  184  --  169 162  --  167  --   --   --    < > = values in this interval not displayed.    Cardiac Enzymes: Recent Labs  Lab 08/30/18 1806  TROPONINI 0.03*    BNP: Invalid input(s): POCBNP  CBG: No results for input(s): GLUCAP in the last 168 hours.  Microbiology: Results for orders placed or performed during the hospital encounter of 08/30/18  MRSA PCR Screening     Status: None   Collection Time: 08/30/18  9:15 PM  Result Value Ref Range Status   MRSA by PCR NEGATIVE NEGATIVE Final    Comment:        The GeneXpert MRSA Assay (FDA approved for NASAL specimens only), is one component of a comprehensive MRSA colonization  surveillance program. It is not intended to diagnose MRSA infection nor to guide or monitor treatment for MRSA infections. Performed at [facility], [address] [place], [place] [address]     Coagulation Studies: No results for input(s): LABPROT, INR in the last 72 hours.  Urinalysis: No results for input(s): COLORURINE, LABSPEC, PHURINE, GLUCOSEU, HGBUR, BILIRUBINUR, KETONESUR, PROTEINUR, UROBILINOGEN, NITRITE, LEUKOCYTESUR in the last 72 hours.  Invalid input(s): APPERANCEUR    Imaging: No results found.   Medications:   . sodium chloride    . sodium chloride     . sodium chloride   Intravenous Once  . allopurinol  200 mg Oral Daily  . Chlorhexidine Gluconate Cloth  6 each Topical Q0600  . cinacalcet  120 mg Oral Q breakfast  . lacosamide  150 mg Oral BID  . lacosamide  150 mg Oral Q M,W,F-HD  . levETIRAcetam  500 mg Oral BID  . levETIRAcetam  500 mg Oral Q M,W,F-HD  . pantoprazole (PROTONIX) IV  40 mg Intravenous Q12H  . sevelamer carbonate  2,400 mg Oral TID WC  . sodium chloride flush  3 mL Intravenous Q12H  . tamsulosin  0.4 mg Oral QPC supper  .  cyanocobalamin  1,000 mcg Oral Daily   sodium chloride, acetaminophen **OR** acetaminophen, albuterol, bisacodyl, budesonide (PULMICORT) nebulizer solution, ondansetron **OR** ondansetron (ZOFRAN) IV, senna-docusate, sodium chloride flush  Assessment/ Plan:  [name] is a 62 y.o. white male with end-stage renal disease on dialysis for for 5 years, coronary disease, history of seizures, was admitted on 09/01/2018 for Syncope, unspecified syncope type [R55] Anemia, unspecified type [D64.9] Non-intractable vomiting with nausea, unspecified vomiting type [R11.2]  [facility]/MWF  1. End Stage Renal Disease:  Continue MWF schedule.   2. Anemia of chronic kidney disease and GI blood loss: with melana for last few days.  2 units PRBC on admission Appreciate GI input.  - Bleeding scan for today.   3. Secondary Hyperparathyroidism:  - Restart sevelamer with meals.  - cinacalcet   4. Hypertension: blood pressure at goal.   5. Urinary Retention - in and out catheterization - Trial of tamsulosin.    LOS: 2 Jeremy Rose 10/20/20191:56 PM

## 2018-09-05 NOTE — Progress Notes (Signed)
Report given to floor RN by Marijean Niemann RN., endo RN.

## 2018-09-05 NOTE — OR Nursing (Signed)
Pt transferred to PACU.

## 2018-09-05 NOTE — Transfer of Care (Signed)
Immediate Anesthesia Transfer of Care Note  Patient: Jeremy Rose  Procedure(s) Performed: ESOPHAGOGASTRODUODENOSCOPY (EGD) (Left ) COLONOSCOPY (Left )  Patient Location: PACU  Anesthesia Type:General  Level of Consciousness: awake  Airway & Oxygen Therapy: Patient Spontanous Breathing and Patient connected to face mask oxygen  Post-op Assessment: Report given to RN and Post -op Vital signs reviewed and stable  Post vital signs: Reviewed and stable  Last Vitals:  Vitals Value Taken Time  BP    Temp    Pulse    Resp    SpO2      Last Pain:  Vitals:   09/05/18 0428  TempSrc:   PainSc: 0-No pain         Complications: No apparent anesthesia complications

## 2018-09-06 ENCOUNTER — Encounter: Payer: Self-pay | Admitting: Gastroenterology

## 2018-09-06 DIAGNOSIS — D649 Anemia, unspecified: Secondary | ICD-10-CM

## 2018-09-06 LAB — BASIC METABOLIC PANEL
ANION GAP: 15 (ref 5–15)
BUN: 77 mg/dL — ABNORMAL HIGH (ref 8–23)
CO2: 25 mmol/L (ref 22–32)
Calcium: 9.3 mg/dL (ref 8.9–10.3)
Chloride: 95 mmol/L — ABNORMAL LOW (ref 98–111)
Creatinine, Ser: 10.33 mg/dL — ABNORMAL HIGH (ref 0.61–1.24)
GFR, EST AFRICAN AMERICAN: 5 mL/min — AB (ref >60)
GFR, EST NON AFRICAN AMERICAN: 5 mL/min — AB (ref >60)
GLUCOSE: 86 mg/dL (ref 70–99)
POTASSIUM: 4.5 mmol/L (ref 3.5–5.1)
SODIUM: 135 mmol/L (ref 135–145)

## 2018-09-06 LAB — VITAMIN B12: Vitamin B-12: 691 pg/mL (ref 180–914)

## 2018-09-06 LAB — FERRITIN: Ferritin: 512 ng/mL — ABNORMAL HIGH (ref 24–336)

## 2018-09-06 LAB — IRON AND TIBC
IRON: 23 ug/dL — AB (ref 45–182)
Saturation Ratios: 12 % — ABNORMAL LOW (ref 17.9–39.5)
TIBC: 189 ug/dL — ABNORMAL LOW (ref 250–450)
UIBC: 166 ug/dL

## 2018-09-06 LAB — FOLATE: Folate: 11.5 ng/mL (ref >5.9)

## 2018-09-06 LAB — HEMOGLOBIN: HEMOGLOBIN: 7 g/dL — AB (ref 13.0–17.0)

## 2018-09-06 LAB — PREPARE RBC (CROSSMATCH)

## 2018-09-06 MED ORDER — SODIUM CHLORIDE 0.9% IV SOLUTION: Freq: Once | INTRAVENOUS | Status: DC

## 2018-09-06 MED ORDER — ACETAMINOPHEN 325 MG PO TABS
650.0000 mg | ORAL_TABLET | Freq: Once | ORAL | Status: AC
  Administered 2018-09-06: 650 mg via ORAL
  Filled 2018-09-06: qty 2

## 2018-09-06 NOTE — Progress Notes (Signed)
HD tx end    09/06/18 1342  Vital Signs  Pulse Rate 91  Pulse Rate Source Monitor  Resp 17  BP 128/77  BP Location Right Arm  BP Method Automatic  Patient Position (if appropriate) Lying  Oxygen Therapy  SpO2 97 %  O2 Device Room Air  During Hemodialysis Assessment  Dialysis Fluid Bolus Normal Saline  Bolus Amount (mL) 250 mL  Intra-Hemodialysis Comments Tx completed

## 2018-09-06 NOTE — Progress Notes (Signed)
HD tx start    09/06/18 1007  Vital Signs  Pulse Rate 83  Pulse Rate Source Monitor  Resp 15  BP 132/67  BP Location Right Arm  BP Method Automatic  Patient Position (if appropriate) Lying  Oxygen Therapy  SpO2 97 %  O2 Device Room Air  During Hemodialysis Assessment  Blood Flow Rate (mL/min) 400 mL/min  Arterial Pressure (mmHg) -110 mmHg  Venous Pressure (mmHg) 190 mmHg  Transmembrane Pressure (mmHg) 60 mmHg  Ultrafiltration Rate (mL/min) 570 mL/min  Dialysate Flow Rate (mL/min) 600 ml/min  Conductivity: Machine  14.1  HD Safety Checks Performed Yes  Dialysis Fluid Bolus Normal Saline  Bolus Amount (mL) 250 mL  Intra-Hemodialysis Comments Tx initiated  Fistula / Graft Left Upper arm Arteriovenous vein graft  Placement Date/Time: 05/28/18 1333   Placed prior to admission: No  Orientation: Left  Access Location: Upper arm  Access Type: Arteriovenous vein graft  Status Accessed  Needle Size 15

## 2018-09-06 NOTE — Care Management Important Message (Signed)
Copy of signed IM left in patient's room (out for procedure). 

## 2018-09-06 NOTE — Progress Notes (Signed)
Post HD assessment    09/06/18 1348  Neurological  Level of Consciousness Alert  Orientation Level Oriented X4  Respiratory  Respiratory Pattern Regular;Unlabored  Chest Assessment Chest expansion symmetrical  Cardiac  Pulse Regular  ECG Monitor Yes  Cardiac Rhythm NSR  Vascular  R Radial Pulse +2  L Radial Pulse +2  Edema Generalized  Integumentary  Integumentary (WDL) X  Skin Color Appropriate for ethnicity  Musculoskeletal  Musculoskeletal (WDL) X  Generalized Weakness Yes  Assistive Device None  GU Assessment  Genitourinary (WDL) X  Genitourinary Symptoms  (HD)  Psychosocial  Psychosocial (WDL) WDL

## 2018-09-06 NOTE — Progress Notes (Signed)
Pre HD assessment    09/06/18 1001  Neurological  Level of Consciousness Alert  Orientation Level Oriented X4  Respiratory  Respiratory Pattern Regular;Unlabored  Chest Assessment Chest expansion symmetrical  Cardiac  Pulse Regular  ECG Monitor Yes  Cardiac Rhythm NSR  Vascular  R Radial Pulse +2  L Radial Pulse +2  Edema Generalized  Integumentary  Integumentary (WDL) X  Skin Color Appropriate for ethnicity  Musculoskeletal  Musculoskeletal (WDL) X  Generalized Weakness Yes  Assistive Device None  GU Assessment  Genitourinary (WDL) X  Genitourinary Symptoms  (HD)  Psychosocial  Psychosocial (WDL) WDL

## 2018-09-06 NOTE — Progress Notes (Addendum)
[place] [facility] [place] NAME: Jeremy Rose    MR#:  [id number]  DATE OF BIRTH:  1956/07/25  Seen at bedside, patient denies any black stools since admission, received 1 unit   CHIEF COMPLAINT:   Chief Complaint  Patient presents with  . Nausea  . Emesis   The patient had melena during preparation for bleeding scan yesterday.  But no melena or bloody stool today.  Hemoglobin decreased to 7.0. REVIEW OF SYSTEMS:    Review of Systems  Constitutional: Negative for chills, fever and malaise/fatigue.  HENT: Negative for hearing loss.   Eyes: Negative for blurred vision, double vision and photophobia.  Respiratory: Negative for cough, hemoptysis and shortness of breath.   Cardiovascular: Negative for palpitations, orthopnea and leg swelling.  Gastrointestinal: Negative for abdominal pain, blood in stool, diarrhea, melena, nausea and vomiting.  Genitourinary: Negative for dysuria and urgency.  Musculoskeletal: Negative for myalgias and neck pain.  Skin: Negative for rash.  Neurological: Negative for dizziness, focal weakness, seizures, weakness and headaches.  Psychiatric/Behavioral: Negative for memory loss. The patient does not have insomnia.   Appears pale  Nutrition:  Tolerating Diet: Tolerating PT:  DRUG ALLERGIES:   Allergies  Allergen Reactions  . Codeine Nausea And Vomiting  . Doxycycline Nausea And Vomiting    VITALS:  Blood pressure 112/72, pulse 85, temperature 97.9 F (36.6 C), temperature source Oral, resp. rate 14, height 5\' 8"  (1.727 m), weight 75 kg, SpO2 100 %.  PHYSICAL EXAMINATION:   Physical Exam  GENERAL:  62 y.o.-year-old patient lying in the bed with no acute distress.  Appears pale EYES: Pupils equal, round, reactive to light  . No scleral icterus. Extraocular muscles intact.  HEENT: Head atraumatic, normocephalic. Oropharynx and nasopharynx clear.  NECK:  Supple, no jugular venous distention. No  thyroid enlargement, no tenderness.  LUNGS: Normal breath sounds bilaterally, no wheezing, rales,rhonchi or crepitation. No use of accessory muscles of respiration.  CARDIOVASCULAR: S1, S2 normal. No murmurs, rubs, or gallops.  ABDOMEN: Soft, nontender, nondistended. Bowel sounds present. No organomegaly or mass.  EXTREMITIES: No pedal edema, cyanosis, or clubbing.  NEUROLOGIC: Cranial nerves II through XII are intact. Muscle strength 5/5 in all extremities. Sensation intact. Gait not checked.  PSYCHIATRIC: The patient is alert and oriented x 3.  SKIN: No obvious rash, lesion, or ulcer.    LABORATORY PANEL:   CBC Recent Labs  Lab 09/03/18 2058  09/06/18 0540  WBC 5.2  --   --   HGB 7.9*   < > 7.0*  HCT 22.4*  --   --   PLT 167  --   --    < > = values in this interval not displayed.   ------------------------------------------------------------------------------------------------------------------  Chemistries  Recent Labs  Lab 09/01/18 1544 09/06/18 0540  NA 137 135  K 3.4* 4.5  CL 94* 95*  CO2 26 25  GLUCOSE 122* 86  BUN 45* 77*  CREATININE 5.01* 10.33*  CALCIUM 8.0* 9.3  AST 13*  --   ALT 10  --   ALKPHOS 87  --   BILITOT 0.5  --    ------------------------------------------------------------------------------------------------------------------  Cardiac Enzymes Recent Labs  Lab 08/30/18 1806  TROPONINI 0.03*   ------------------------------------------------------------------------------------------------------------------  RADIOLOGY:  Nm Gi Blood Loss  Result Date: 09/05/2018 CLINICAL DATA:  Active GI bleeding. EXAM: NUCLEAR MEDICINE GASTROINTESTINAL BLEEDING SCAN TECHNIQUE: Sequential abdominal images were obtained for 120 minutes following intravenous administration of Tc-31m labeled red blood cells. RADIOPHARMACEUTICALS:  22.9 mCi Tc-63m pertechnetate in-vitro labeled red cells. COMPARISON:  None. FINDINGS: Physiologic distribution of activity is seen.  Prominent activity also noted in the area the genitals. No abnormal radiopharmaceutical activity seen within the small or large bowel during the exam. IMPRESSION: Negative for active GI bleeding. Electronically Signed   By: Earle Gell M.D.   On: 09/05/2018 18:52     ASSESSMENT AND PLAN:   Active Problems:   Anemia   #1. Symptomatic anemia due to GIB, status post EGD showing gastritis, duodenitis, discharged home with Protonix but patient felt weak and passed out at dialysis, hemoglobin down to 7.2 , s/p 1 unit of packed RBC because of symptoms, hemoglobin down to 5.8, transfused 2 unit of packed RBC with dialysis, up to 7.9 but down to 7.2,  given 1 more unit PRBC transfusion.  Hb up to 7.5, but down to 7.0 today. Per Dr. Bonna Gains, EGD: Erythematous mucosa in the antrum. Erythematous duodenopathy. Perform an H. pylori serology. Change PPI to PO once daily for 30 days. Colonoscopy: Blood clots in colon suggest anemia and bleeding to                        be likely from the more proximal colon than the extent                        of the exam. That is, the cecum or proximal ascending                        colon, or from the small bowel.  Bleeding scan: No source of bleeding.  ESRD: Hemodialysis on Monday, Wednesday, Friday.  Urinary retention.  Continue Flomax, cath in and out.  Syncope due to symptomatic anemia, status post blood transfusion,  I discussed with Dr. Holley Raring and Dr. Vicente Males. . 50% time spent in counseling, coordination of care All the records are reviewed and case discussed with Care Management/Social Workerr. Management plans discussed with the patient, family and they are in agreement.  CODE STATUS: Full code  TOTAL TIME TAKING CARE OF THIS PATIENT: 28 minutes.   POSSIBLE D/C IN 2 DAYS, DEPENDING ON CLINICAL CONDITION.   Demetrios Loll M.D on 09/06/2018 at 2:53 PM  Between 7am to 6pm - Pager - [phone]  After 6pm go to [email or web address] - password [email or web address]  [facility]  [place]  Office  [phone]  CC: Primary care physician; Freddy Finner, NP

## 2018-09-06 NOTE — Progress Notes (Signed)
Jeremy Rose , MD [address] [place], [place], [place], [address] [address], [place], [place], [place], [address] Phone: [phone]  Fax: [phone]   Jeremy Rose is being followed for melena  Subjective: No bowel movements today .   Objective: Vital signs in last 24 hours: Vitals:   09/05/18 1459 09/05/18 1519 09/05/18 1937 09/06/18 0515  BP: 132/66 138/69 (!) 158/57 (!) 142/40  Pulse:   (!) 109 97  Resp:   16 (!) 24  Temp:   99.1 F (37.3 C) 98.9 F (37.2 C)  TempSrc:   Oral Oral  SpO2:   97% 97%  Weight:      Height:       Weight change:   Intake/Output Summary (Last 24 hours) at 09/06/2018 0945 Last data filed at 09/05/2018 1155 Gross per 24 hour  Intake -  Output 1000 ml  Net -1000 ml     Exam: Heart:: Regular rate and rhythm, S1S2 present or without murmur or extra heart sounds Lungs: normal, clear to auscultation and clear to auscultation and percussion Abdomen: soft, nontender, normal bowel sounds   Lab Results: @LABTEST2 @ Micro Results: Recent Results (from the past 240 hour(s))  MRSA PCR Screening     Status: None   Collection Time: 08/30/18  9:15 PM  Result Value Ref Range Status   MRSA by PCR NEGATIVE NEGATIVE Final    Comment:        The GeneXpert MRSA Assay (FDA approved for NASAL specimens only), is one component of a comprehensive MRSA colonization surveillance program. It is not intended to diagnose MRSA infection nor to guide or monitor treatment for MRSA infections. Performed at [facility], [address] [place], [place] [address]    Studies/Results: Nm Gi Blood Loss  Result Date: 09/05/2018 CLINICAL DATA:  Active GI bleeding. EXAM: NUCLEAR MEDICINE GASTROINTESTINAL BLEEDING SCAN TECHNIQUE: Sequential abdominal images were obtained for 120 minutes following intravenous administration of Tc-46m labeled red blood cells. RADIOPHARMACEUTICALS:  22.9 mCi Tc-72m pertechnetate in-vitro labeled red  cells. COMPARISON:  None. FINDINGS: Physiologic distribution of activity is seen. Prominent activity also noted in the area the genitals. No abnormal radiopharmaceutical activity seen within the small or large bowel during the exam. IMPRESSION: Negative for active GI bleeding. Electronically Signed   By: Jeremy Rose M.D.   On: 09/05/2018 18:52   Medications: I have reviewed the patient's current medications. Scheduled Meds: . sodium chloride   Intravenous Once  . sodium chloride   Intravenous Once  . allopurinol  200 mg Oral Daily  . Chlorhexidine Gluconate Cloth  6 each Topical Q0600  . cinacalcet  120 mg Oral Q breakfast  . lacosamide  150 mg Oral BID  . lacosamide  150 mg Oral Q M,W,F-HD  . levETIRAcetam  500 mg Oral BID  . levETIRAcetam  500 mg Oral Q M,W,F-HD  . pantoprazole  40 mg Oral Daily  . sevelamer carbonate  2,400 mg Oral TID WC  . sodium chloride flush  3 mL Intravenous Q12H  . tamsulosin  0.4 mg Oral QPC supper  . cyanocobalamin  1,000 mcg Oral Daily   Continuous Infusions: . sodium chloride    . sodium chloride     PRN Meds:.sodium chloride, acetaminophen **OR** acetaminophen, albuterol, bisacodyl, budesonide (PULMICORT) nebulizer solution, ondansetron **OR** ondansetron (ZOFRAN) IV, senna-docusate, sodium chloride flush Iron/TIBC/Ferritin/ %Sat No results found for: IRON, TIBC, FERRITIN, IRONPCTSAT   Assessment: Active Problems:   Anemia  Jeremy Rose 62 y.o.  male admitted on 09/01/2018 due to melena and anemia.  Previously evaluated by Dr. Allen Norris in October and underwent an EGD at that point of time. Seen by Dr. Bonna Gains over the weekend.  No gross abnormality seen on upper endoscopy on 09/05/2018 which would explain the degree of anemia.  She followed up with a colonoscopy but could not reach the cecum due to significant tortuosity.  Bright red blood clots were noted in the ascending colon.  This was followed up by a tagged RBC scan which showed no active  bleeding.  Hemoglobin is dropped further to 7 g.  Plan: 1.  With further drop in hemoglobin overnight there is possibility that the bleeding is intermittent and hence the tagged RBC scan was negative.  I would suggest that we proceed with capsule study of the small bowel tomorrow morning  to rule out a small bowel bleed.  And based on the results we can discuss or decide upon a Meckel scan if needed.  2.  Since he has CKD she would be at a high probability for small bowel AVMs. He could also have a coexisting iron deficiency anemia and since iron studies never been checked I would suggest checking iron studies, B12, folate.   Risks, benefits, alternatives of Givens capsule discussed with patient to include but not limited to the rare risk of Given's capsule becoming lodged in the GI tract requiring surgical removal.  The patient agrees with this plan & consent will be obtained.    LOS: 3 days   Jeremy Bellows, MD 09/06/2018, 9:45 AM

## 2018-09-06 NOTE — Progress Notes (Signed)
Start 1 unit blood administration   09/06/18 1158  Vital Signs  Temp 98.1 F (36.7 C)  Pulse Rate 89  Resp 17  BP 122/77  Oxygen Therapy  SpO2 96 %

## 2018-09-06 NOTE — Progress Notes (Signed)
Pre HD assessment    09/06/18 1000  Vital Signs  Temp 98 F (36.7 C)  Temp Source Oral  Pulse Rate 86  Pulse Rate Source Monitor  Resp (!) 21  BP 125/83  BP Location Right Arm  BP Method Automatic  Patient Position (if appropriate) Lying  Oxygen Therapy  SpO2 97 %  O2 Device Room Air  Pain Assessment  Pain Scale 0-10  Pain Score 0  Dialysis Weight  Weight 76.4 kg  Type of Weight Pre-Dialysis  Time-Out for Hemodialysis  What Procedure? HD  Pt Identifiers(min of two) First/Last Name;MRN/Account#  Correct Site? Yes  Correct Side? Yes  Correct Procedure? Yes  Consents Verified? Yes  Rad Studies Available? N/A  Safety Precautions Reviewed? Yes  Engineer, civil (consulting) Number  ([id number])  Station Number 3  UF/Alarm Test Passed  Conductivity: Meter 13.9  Conductivity: Machine  14.1  pH 7.4  Reverse Osmosis main  Normal Saline Lot Number [id number]  Dialyzer Lot Number [id number]  Disposable Set Lot Number [id number]  Machine Temperature 98.6 F (37 C)  Musician and Audible Yes  Blood Lines Intact and Secured Yes  Pre Treatment Patient Checks  Vascular access used during treatment Graft  Hepatitis B Surface Antigen Results Negative  Date Hepatitis B Surface Antigen Drawn 08/18/18  Hepatitis B Surface Antibody  (<10)  Date Hepatitis B Surface Antibody Drawn 07/21/18  Hemodialysis Consent Verified Yes  Hemodialysis Standing Orders Initiated Yes  ECG (Telemetry) Monitor On Yes  Prime Ordered Normal Saline  Length of  DialysisTreatment -hour(s) 3.5 Hour(s)  Dialyzer Elisio 17H NR  Dialysate 2K, 2.5 Ca  Dialysis Anticoagulant None  Dialysate Flow Ordered 600  Blood Flow Rate Ordered 400 mL/min  Ultrafiltration Goal 1.5 Liters  Dialysis Blood Pressure Support Ordered Normal Saline  Education / Care Plan  Dialysis Education Provided Yes  Documented Education in Care Plan Yes  Fistula / Graft Left Upper arm Arteriovenous vein graft  Placement Date/Time: 05/28/18 1333    Placed prior to admission: No  Orientation: Left  Access Location: Upper arm  Access Type: Arteriovenous vein graft  Site Condition No complications  Fistula / Graft Assessment Present;Thrill;Bruit  Drainage Description None

## 2018-09-06 NOTE — Progress Notes (Signed)
[facility]  ROUNDING NOTE   Subjective:  Vision completed hemodialysis today. He will be due for capsule endoscopy tomorrow. Feeling well at the moment.   Objective:  Vital signs in last 24 hours:  Temp:  [97.8 F (36.6 C)-99.1 F (37.3 C)] 97.9 F (36.6 C) (10/21 1438) Pulse Rate:  [79-109] 85 (10/21 1438) Resp:  [14-24] 14 (10/21 1438) BP: (112-158)/(40-83) 112/72 (10/21 1438) SpO2:  [94 %-100 %] 100 % (10/21 1438) Weight:  [75 kg-76.4 kg] 75 kg (10/21 1349)  Weight change:  Filed Weights   09/03/18 1945 09/06/18 1000 09/06/18 1349  Weight: 73.6 kg 76.4 kg 75 kg    Intake/Output: I/O last 3 completed shifts: In: 310 [Blood:310] Out: 1200 [Urine:1200]   Intake/Output this shift:  Total I/O In: 350 [Blood:350] Out: 1558 [Other:1558]  Physical Exam: General: NAD  Head: Normocephalic, atraumatic. Moist oral mucosal membranes  Eyes: Anicteric  Neck: Supple, trachea midline  Lungs:  Clear to auscultation  Heart: Regular rate and rhythm  Abdomen:  Soft, nontender  Extremities: no peripheral edema.  Neurologic: Nonfocal, moving all four extremities  Skin: No lesions  Access: Left AVF    Basic Metabolic Panel: Recent Labs  Lab 08/30/18 1806 08/31/18 0404 09/01/18 1544 09/06/18 0540  NA 134* 135 137 135  K 3.2* 3.8 3.4* 4.5  CL 94* 96* 94* 95*  CO2 28 27 26 25   GLUCOSE 109* 88 122* 86  BUN 28* 43* 45* 77*  CREATININE 4.85* 6.24* 5.01* 10.33*  CALCIUM 8.2* 8.5* 8.0* 9.3    Liver Function Tests: Recent Labs  Lab 09/01/18 1544  AST 13*  ALT 10  ALKPHOS 87  BILITOT 0.5  PROT 6.3*  ALBUMIN 3.5   Recent Labs  Lab 09/01/18 1544  LIPASE 39   No results for input(s): AMMONIA in the last 168 hours.  CBC: Recent Labs  Lab 08/30/18 1806  09/01/18 1544  09/02/18 1020 09/03/18 1024 09/03/18 1316 09/03/18 2058 09/04/18 1104 09/05/18 0456 09/05/18 1119 09/06/18 0540  WBC 6.3  --  6.1  --  8.1 5.8  --  5.2  --   --   --   --    NEUTROABS  --   --  4.5  --   --   --   --   --   --   --   --   --   HGB 7.6*   < > 7.2*   < > 7.0* 5.8* 6.1* 7.9* 7.2* 7.4* 7.5* 7.0*  HCT 22.8*  --  21.1*  --  20.4* 17.5* 18.3* 22.4*  --   --   --   --   MCV 95.4  --  93.4  --  93.2 94.6  --  90.0  --   --   --   --   PLT 207  --  184  --  169 162  --  167  --   --   --   --    < > = values in this interval not displayed.    Cardiac Enzymes: Recent Labs  Lab 08/30/18 1806  TROPONINI 0.03*    BNP: Invalid input(s): POCBNP  CBG: No results for input(s): GLUCAP in the last 168 hours.  Microbiology: Results for orders placed or performed during the hospital encounter of 08/30/18  MRSA PCR Screening     Status: None   Collection Time: 08/30/18  9:15 PM  Result Value Ref Range Status   MRSA by PCR NEGATIVE  NEGATIVE Final    Comment:        The GeneXpert MRSA Assay (FDA approved for NASAL specimens only), is one component of a comprehensive MRSA colonization surveillance program. It is not intended to diagnose MRSA infection nor to guide or monitor treatment for MRSA infections. Performed at [facility], [address] [place], [place] [address]     Coagulation Studies: No results for input(s): LABPROT, INR in the last 72 hours.  Urinalysis: No results for input(s): COLORURINE, LABSPEC, PHURINE, GLUCOSEU, HGBUR, BILIRUBINUR, KETONESUR, PROTEINUR, UROBILINOGEN, NITRITE, LEUKOCYTESUR in the last 72 hours.  Invalid input(s): APPERANCEUR    Imaging: Nm Gi Blood Loss  Result Date: 09/05/2018 CLINICAL DATA:  Active GI bleeding. EXAM: NUCLEAR MEDICINE GASTROINTESTINAL BLEEDING SCAN TECHNIQUE: Sequential abdominal images were obtained for 120 minutes following intravenous administration of Tc-64m labeled red blood cells. RADIOPHARMACEUTICALS:  22.9 mCi Tc-35m pertechnetate in-vitro labeled red cells. COMPARISON:  None. FINDINGS: Physiologic distribution of activity is seen. Prominent activity also noted in the  area the genitals. No abnormal radiopharmaceutical activity seen within the small or large bowel during the exam. IMPRESSION: Negative for active GI bleeding. Electronically Signed   By: Earle Gell M.D.   On: 09/05/2018 18:52     Medications:   . sodium chloride    . sodium chloride     . sodium chloride   Intravenous Once  . sodium chloride   Intravenous Once  . allopurinol  200 mg Oral Daily  . Chlorhexidine Gluconate Cloth  6 each Topical Q0600  . cinacalcet  120 mg Oral Q breakfast  . lacosamide  150 mg Oral BID  . lacosamide  150 mg Oral Q M,W,F-HD  . levETIRAcetam  500 mg Oral BID  . levETIRAcetam  500 mg Oral Q M,W,F-HD  . pantoprazole  40 mg Oral Daily  . sevelamer carbonate  2,400 mg Oral TID WC  . sodium chloride flush  3 mL Intravenous Q12H  . tamsulosin  0.4 mg Oral QPC supper  . cyanocobalamin  1,000 mcg Oral Daily   sodium chloride, acetaminophen **OR** acetaminophen, albuterol, bisacodyl, budesonide (PULMICORT) nebulizer solution, ondansetron **OR** ondansetron (ZOFRAN) IV, senna-docusate, sodium chloride flush  Assessment/ Plan:  [name] is a 62 y.o. white male with end-stage renal disease on dialysis for for 5 years, coronary disease, history of seizures, was admitted on 09/01/2018 for Syncope, unspecified syncope type [R55] Anemia, unspecified type [D64.9] Non-intractable vomiting with nausea, unspecified vomiting type [R11.2]  [facility]/MWF  1. End Stage Renal Disease:  Patient completed hemodialysis today.  We will plan for dialysis again on Wednesday if still here..   2. Anemia of chronic kidney disease and GI blood loss: with melana for last few days.  2 units PRBC on admission Capsule endoscopy tomorrow.  3. Secondary Hyperparathyroidism:  -We will maintain the patient on Renvela and Sensipar at this time.  4. Hypertension: Currently off of antihypertensives.  5. Urinary Retention - in and out  catheterization - Trial of tamsulosin.    LOS: 3 Jeremy Rose 10/21/20195:52 PM

## 2018-09-06 NOTE — Progress Notes (Signed)
Blood administration end    09/06/18 1245  Vital Signs  Temp 97.8 F (36.6 C)  Temp Source Oral  Pulse Rate 86  Pulse Rate Source Monitor  Resp 18  BP 118/66  BP Location Right Arm  BP Method Automatic  Patient Position (if appropriate) Lying  Oxygen Therapy  SpO2 97 %  O2 Device Room Air  During Hemodialysis Assessment  Blood Flow Rate (mL/min) 400 mL/min  Arterial Pressure (mmHg) -160 mmHg  Venous Pressure (mmHg) 210 mmHg  Transmembrane Pressure (mmHg) 70 mmHg  Ultrafiltration Rate (mL/min) 810 mL/min  Dialysate Flow Rate (mL/min) 600 ml/min  Conductivity: Machine  15.1  HD Safety Checks Performed Yes  Intra-Hemodialysis Comments Progressing as prescribed (7409)

## 2018-09-06 NOTE — Progress Notes (Signed)
Post HD assessment. Pt tolerated tx well without c/o or complication.Pt received 1 unit of blood during HD tx. Net UF 1558, goal met.    09/06/18 1349  Vital Signs  Temp 97.8 F (36.6 C)  Temp Source Oral  Pulse Rate 83  Pulse Rate Source Monitor  Resp 17  BP 121/75  BP Location Right Arm  BP Method Automatic  Patient Position (if appropriate) Lying  Oxygen Therapy  SpO2 99 %  O2 Device Room Air  Dialysis Weight  Weight 75 kg  Type of Weight Post-Dialysis  Post-Hemodialysis Assessment  Rinseback Volume (mL) 250 mL  KECN 80 V  Dialyzer Clearance Lightly streaked  Duration of HD Treatment -hour(s) 3.5 hour(s)  Hemodialysis Intake (mL) 850 mL  UF Total -Machine (mL) 2408 mL  Net UF (mL) 1558 mL  Tolerated HD Treatment Yes  AVG/AVF Arterial Site Held (minutes) 10 minutes  AVG/AVF Venous Site Held (minutes) 10 minutes  Education / Care Plan  Dialysis Education Provided Yes  Documented Education in Care Plan Yes  Fistula / Graft Left Upper arm Arteriovenous vein graft  Placement Date/Time: 05/28/18 1333   Placed prior to admission: No  Orientation: Left  Access Location: Upper arm  Access Type: Arteriovenous vein graft  Site Condition No complications  Fistula / Graft Assessment Present;Thrill;Bruit  Status Deaccessed  Drainage Description None

## 2018-09-07 ENCOUNTER — Encounter
Admission: EM | Disposition: A | Discharge status: Other Acute Inpatient Hospital | Payer: Self-pay | Source: Home / Self Care | Attending: Internal Medicine

## 2018-09-07 DIAGNOSIS — K921 Melena: Secondary | ICD-10-CM

## 2018-09-07 HISTORY — PX: GIVENS CAPSULE STUDY: SHX5432

## 2018-09-07 LAB — CBC
HEMATOCRIT: 20.6 % — AB (ref 39.0–52.0)
Hemoglobin: 6.9 g/dL — ABNORMAL LOW (ref 13.0–17.0)
MCH: 31.5 pg (ref 26.0–34.0)
MCHC: 33.5 g/dL (ref 30.0–36.0)
MCV: 94.1 fL (ref 80.0–100.0)
Platelets: 171 10*3/uL (ref 150–400)
RBC: 2.19 MIL/uL — ABNORMAL LOW (ref 4.22–5.81)
RDW: 15.4 % (ref 11.5–15.5)
WBC: 4.9 10*3/uL (ref 4.0–10.5)
nRBC: 0 % (ref 0.0–0.2)

## 2018-09-07 LAB — HEMOGLOBIN AND HEMATOCRIT, BLOOD
HCT: 23.4 % — ABNORMAL LOW (ref 39.0–52.0)
Hemoglobin: 8 g/dL — ABNORMAL LOW (ref 13.0–17.0)

## 2018-09-07 LAB — PREPARE RBC (CROSSMATCH)

## 2018-09-07 LAB — HEMOGLOBIN: HEMOGLOBIN: 7.2 g/dL — AB (ref 13.0–17.0)

## 2018-09-07 SURGERY — IMAGING PROCEDURE, GI TRACT, INTRALUMINAL, VIA CAPSULE

## 2018-09-07 MED ORDER — HEPARIN SODIUM (PORCINE) 1000 UNIT/ML DIALYSIS
1000.0000 [IU] | INTRAMUSCULAR | Status: DC | PRN
  Filled 2018-09-07: qty 1

## 2018-09-07 MED ORDER — SODIUM CHLORIDE 0.9 % IV SOLN: 100.0000 mL | INTRAVENOUS | Status: DC | PRN

## 2018-09-07 MED ORDER — LIDOCAINE HCL (PF) 1 % IJ SOLN
5.0000 mL | INTRAMUSCULAR | Status: DC | PRN
  Filled 2018-09-07: qty 5

## 2018-09-07 MED ORDER — LIDOCAINE-PRILOCAINE 2.5-2.5 % EX CREA
1.0000 "application " | TOPICAL_CREAM | CUTANEOUS | Status: DC | PRN
  Filled 2018-09-07: qty 5

## 2018-09-07 MED ORDER — SODIUM CHLORIDE 0.9% IV SOLUTION
Freq: Once | INTRAVENOUS | Status: AC
  Administered 2018-09-07: 14:00:00 via INTRAVENOUS

## 2018-09-07 MED ORDER — ALTEPLASE 2 MG IJ SOLR: 2.0000 mg | Freq: Once | INTRAMUSCULAR | Status: DC | PRN

## 2018-09-07 MED ORDER — ACETAMINOPHEN 325 MG PO TABS
650.0000 mg | ORAL_TABLET | Freq: Once | ORAL | Status: AC
  Administered 2018-09-07: 650 mg via ORAL
  Filled 2018-09-07: qty 2

## 2018-09-07 MED ORDER — PENTAFLUOROPROP-TETRAFLUOROETH EX AERO
1.0000 "application " | INHALATION_SPRAY | CUTANEOUS | Status: DC | PRN
  Filled 2018-09-07: qty 30

## 2018-09-07 MED ORDER — CHLORHEXIDINE GLUCONATE CLOTH 2 % EX PADS: 6.0000 | MEDICATED_PAD | Freq: Every day | CUTANEOUS | Status: DC

## 2018-09-07 NOTE — Progress Notes (Signed)
Givens capsule study complete

## 2018-09-07 NOTE — Progress Notes (Signed)
[facility]  ROUNDING NOTE   Subjective:  Patient underwent capsule endoscopy today. No hematemesis or bloody stools. Tolerated dialysis well yesterday.   Objective:  Vital signs in last 24 hours:  Temp:  [97.8 F (36.6 C)-98.6 F (37 C)] 98.3 F (36.8 C) (10/22 0725) Pulse Rate:  [79-92] 80 (10/22 0725) Resp:  [14-19] 14 (10/22 0725) BP: (112-134)/(66-78) 133/77 (10/22 0725) SpO2:  [95 %-100 %] 96 % (10/22 0725) Weight:  [75 kg] 75 kg (10/21 1349)  Weight change:  Filed Weights   09/03/18 1945 09/06/18 1000 09/06/18 1349  Weight: 73.6 kg 76.4 kg 75 kg    Intake/Output: I/O last 3 completed shifts: In: 950 [P.O.:600; Blood:350] Out: 2133 [Urine:575; Other:1558]   Intake/Output this shift:  No intake/output data recorded.  Physical Exam: General: NAD  Head: Normocephalic, atraumatic. Moist oral mucosal membranes  Eyes: Anicteric  Neck: Supple, trachea midline  Lungs:  Clear to auscultation  Heart: Regular rate and rhythm  Abdomen:  Soft, nontender  Extremities: no peripheral edema.  Neurologic: Nonfocal, moving all four extremities  Skin: No lesions  Access: Left AVF    Basic Metabolic Panel: Recent Labs  Lab 09/01/18 1544 09/06/18 0540  NA 137 135  K 3.4* 4.5  CL 94* 95*  CO2 26 25  GLUCOSE 122* 86  BUN 45* 77*  CREATININE 5.01* 10.33*  CALCIUM 8.0* 9.3    Liver Function Tests: Recent Labs  Lab 09/01/18 1544  AST 13*  ALT 10  ALKPHOS 87  BILITOT 0.5  PROT 6.3*  ALBUMIN 3.5   Recent Labs  Lab 09/01/18 1544  LIPASE 39   No results for input(s): AMMONIA in the last 168 hours.  CBC: Recent Labs  Lab 09/01/18 1544  09/02/18 1020 09/03/18 1024 09/03/18 1316 09/03/18 2058 09/04/18 1104 09/05/18 0456 09/05/18 1119 09/06/18 0540 09/07/18 0622  WBC 6.1  --  8.1 5.8  --  5.2  --   --   --   --   --   NEUTROABS 4.5  --   --   --   --   --   --   --   --   --   --   HGB 7.2*   < > 7.0* 5.8* 6.1* 7.9* 7.2* 7.4* 7.5* 7.0*  7.2*  HCT 21.1*  --  20.4* 17.5* 18.3* 22.4*  --   --   --   --   --   MCV 93.4  --  93.2 94.6  --  90.0  --   --   --   --   --   PLT 184  --  169 162  --  167  --   --   --   --   --    < > = values in this interval not displayed.    Cardiac Enzymes: No results for input(s): CKTOTAL, CKMB, CKMBINDEX, TROPONINI in the last 168 hours.  BNP: Invalid input(s): POCBNP  CBG: No results for input(s): GLUCAP in the last 168 hours.  Microbiology: Results for orders placed or performed during the hospital encounter of 08/30/18  MRSA PCR Screening     Status: None   Collection Time: 08/30/18  9:15 PM  Result Value Ref Range Status   MRSA by PCR NEGATIVE NEGATIVE Final    Comment:        The GeneXpert MRSA Assay (FDA approved for NASAL specimens only), is one component of a comprehensive MRSA colonization surveillance program. It is not  intended to diagnose MRSA infection nor to guide or monitor treatment for MRSA infections. Performed at [facility], [address] [place], [place] [address]     Coagulation Studies: No results for input(s): LABPROT, INR in the last 72 hours.  Urinalysis: No results for input(s): COLORURINE, LABSPEC, PHURINE, GLUCOSEU, HGBUR, BILIRUBINUR, KETONESUR, PROTEINUR, UROBILINOGEN, NITRITE, LEUKOCYTESUR in the last 72 hours.  Invalid input(s): APPERANCEUR    Imaging: Nm Gi Blood Loss  Result Date: 09/05/2018 CLINICAL DATA:  Active GI bleeding. EXAM: NUCLEAR MEDICINE GASTROINTESTINAL BLEEDING SCAN TECHNIQUE: Sequential abdominal images were obtained for 120 minutes following intravenous administration of Tc-15m labeled red blood cells. RADIOPHARMACEUTICALS:  22.9 mCi Tc-6m pertechnetate in-vitro labeled red cells. COMPARISON:  None. FINDINGS: Physiologic distribution of activity is seen. Prominent activity also noted in the area the genitals. No abnormal radiopharmaceutical activity seen within the small or large bowel during the exam.  IMPRESSION: Negative for active GI bleeding. Electronically Signed   By: Earle Gell M.D.   On: 09/05/2018 18:52     Medications:   . sodium chloride    . sodium chloride     . sodium chloride   Intravenous Once  . sodium chloride   Intravenous Once  . sodium chloride   Intravenous Once  . acetaminophen  650 mg Oral Once  . allopurinol  200 mg Oral Daily  . Chlorhexidine Gluconate Cloth  6 each Topical Q0600  . cinacalcet  120 mg Oral Q breakfast  . lacosamide  150 mg Oral BID  . lacosamide  150 mg Oral Q M,W,F-HD  . levETIRAcetam  500 mg Oral BID  . levETIRAcetam  500 mg Oral Q M,W,F-HD  . pantoprazole  40 mg Oral Daily  . sevelamer carbonate  2,400 mg Oral TID WC  . sodium chloride flush  3 mL Intravenous Q12H  . tamsulosin  0.4 mg Oral QPC supper  . cyanocobalamin  1,000 mcg Oral Daily   sodium chloride, acetaminophen **OR** acetaminophen, albuterol, bisacodyl, budesonide (PULMICORT) nebulizer solution, ondansetron **OR** ondansetron (ZOFRAN) IV, senna-docusate, sodium chloride flush  Assessment/ Plan:  [name] is a 62 y.o. white male with end-stage renal disease on dialysis for for 5 years, coronary disease, history of seizures, was admitted on 09/01/2018 for Syncope, unspecified syncope type [R55] Anemia, unspecified type [D64.9] Non-intractable vomiting with nausea, unspecified vomiting type [R11.2]  [facility]/MWF  1. End Stage Renal Disease:  Patient completed hemodialysis yesterday.  No acute indication for dialysis today.  We will plan for dialysis again tomorrow.  2. Anemia of chronic kidney disease and GI blood loss: with melana for last few days.  2 units PRBC on admission Capsule endoscopy today.  3. Secondary Hyperparathyroidism:  -recheck serum phosphorus with dialysis  Otherwise continueRenvela and Sensipar.  4. Hypertension: Currently off of antihypertensives.  5. Urinary Retention - in and out catheterization -  Trial of tamsulosin.    LOS: 4 Carlene Bickley 10/22/201912:17 PM

## 2018-09-07 NOTE — Progress Notes (Signed)
Jeremy Rose , MD [address], [place], [place], [place], [address] 3940 [address], [place], [place], [place], [address] Phone: [phone]  Fax: [phone]   Jeremy Rose is being followed for GI bleed  Subjective: No  Bowel movements today, no complaints    Objective: Vital signs in last 24 hours: Vitals:   09/06/18 1438 09/06/18 1937 09/07/18 0412 09/07/18 0725  BP: 112/72 134/69 115/72 133/77  Pulse: 85 92 85 80  Resp: 14 16 16 14   Temp: 97.9 F (36.6 C) 98.2 F (36.8 C) 98.6 F (37 C) 98.3 F (36.8 C)  TempSrc: Oral Oral Oral Oral  SpO2: 100% 98% 95% 96%  Weight:      Height:       Weight change:   Intake/Output Summary (Last 24 hours) at 09/07/2018 3825 Last data filed at 09/07/2018 0505 Gross per 24 hour  Intake 950 ml  Output 2133 ml  Net -1183 ml     Exam: Heart:: Regular rate and rhythm, S1S2 present or without murmur or extra heart sounds Lungs: normal, clear to auscultation and clear to auscultation and percussion Abdomen: soft, nontender, normal bowel sounds   Lab Results: @LABTEST2 @ Micro Results: Recent Results (from the past 240 hour(s))  MRSA PCR Screening     Status: None   Collection Time: 08/30/18  9:15 PM  Result Value Ref Range Status   MRSA by PCR NEGATIVE NEGATIVE Final    Comment:        The GeneXpert MRSA Assay (FDA approved for NASAL specimens only), is one component of a comprehensive MRSA colonization surveillance program. It is not intended to diagnose MRSA infection nor to guide or monitor treatment for MRSA infections. Performed at [facility], [address] [place], [place] [address]    Studies/Results: Nm Gi Blood Loss  Result Date: 09/05/2018 CLINICAL DATA:  Active GI bleeding. EXAM: NUCLEAR MEDICINE GASTROINTESTINAL BLEEDING SCAN TECHNIQUE: Sequential abdominal images were obtained for 120 minutes following intravenous administration of Tc-42m labeled red blood cells.  RADIOPHARMACEUTICALS:  22.9 mCi Tc-91m pertechnetate in-vitro labeled red cells. COMPARISON:  None. FINDINGS: Physiologic distribution of activity is seen. Prominent activity also noted in the area the genitals. No abnormal radiopharmaceutical activity seen within the small or large bowel during the exam. IMPRESSION: Negative for active GI bleeding. Electronically Signed   By: Earle Gell M.D.   On: 09/05/2018 18:52   Medications: I have reviewed the patient's current medications. Scheduled Meds: . sodium chloride   Intravenous Once  . sodium chloride   Intravenous Once  . allopurinol  200 mg Oral Daily  . Chlorhexidine Gluconate Cloth  6 each Topical Q0600  . cinacalcet  120 mg Oral Q breakfast  . lacosamide  150 mg Oral BID  . lacosamide  150 mg Oral Q M,W,F-HD  . levETIRAcetam  500 mg Oral BID  . levETIRAcetam  500 mg Oral Q M,W,F-HD  . pantoprazole  40 mg Oral Daily  . sevelamer carbonate  2,400 mg Oral TID WC  . sodium chloride flush  3 mL Intravenous Q12H  . tamsulosin  0.4 mg Oral QPC supper  . cyanocobalamin  1,000 mcg Oral Daily   Continuous Infusions: . sodium chloride    . sodium chloride     PRN Meds:.sodium chloride, acetaminophen **OR** acetaminophen, albuterol, bisacodyl, budesonide (PULMICORT) nebulizer solution, ondansetron **OR** ondansetron (ZOFRAN) IV, senna-docusate, sodium chloride flush  CBC Latest Ref Rng & Units 09/07/2018 09/06/2018 09/05/2018  WBC 4.0 - 10.5 K/uL - - -  Hemoglobin 13.0 - 17.0 g/dL 7.2(L) 7.0(L) 7.5(L)  Hematocrit 39.0 - 52.0 % - - -  Platelets 150 - 400 K/uL - - -   CBC Latest Ref Rng & Units 09/07/2018 09/06/2018 09/05/2018  WBC 4.0 - 10.5 K/uL - - -  Hemoglobin 13.0 - 17.0 g/dL 7.2(L) 7.0(L) 7.5(L)  Hematocrit 39.0 - 52.0 % - - -  Platelets 150 - 400 K/uL - - -    Assessment: Active Problems:   Anemia  Jeremy Rose 62 y.o. male admitted on 09/01/2018 due to melena and anemia.  Previously evaluated by Dr. Allen Norris in October and  underwent an EGD at that point of time. Seen by Dr. Bonna Gains over the weekend.  No gross abnormality seen on upper endoscopy on 09/05/2018 which would explain the degree of anemia.  She followed up with a colonoscopy but could not reach the cecum due to significant tortuosity.  Bright red blood clots were noted in the ascending colon.  This was followed up by a tagged RBC scan which showed no active bleeding.  Hemoglobin is dropped further to 7 g.  Plan: 1.  Capsule study being performed- will read the recorder tomorrow afternoon  2. Monitor CBC and transfuse as needed 3. Iron low : suggest dose of IV iron, ferritin may be inappropriately normal if taken from a sample post transfusion.     LOS: 4 days   Jeremy Bellows, MD 09/07/2018, 8:39 AM

## 2018-09-07 NOTE — Progress Notes (Signed)
[facility] [place] NAME: Jeremy Rose    MR#:  [id number]  DATE OF BIRTH:  December 18, 1955  Seen at bedside, patient denies any black stools since admission, received 1 unit   CHIEF COMPLAINT:   Chief Complaint  Patient presents with  . Nausea  . Emesis   The patient has no melena today.  Hemoglobin 7.2 after 1 unit PRBC. REVIEW OF SYSTEMS:    Review of Systems  Constitutional: Negative for chills, fever and malaise/fatigue.  HENT: Negative for hearing loss.   Eyes: Negative for blurred vision, double vision and photophobia.  Respiratory: Negative for cough, hemoptysis and shortness of breath.   Cardiovascular: Negative for palpitations, orthopnea and leg swelling.  Gastrointestinal: Negative for abdominal pain, blood in stool, diarrhea, melena, nausea and vomiting.  Genitourinary: Negative for dysuria and urgency.  Musculoskeletal: Negative for myalgias and neck pain.  Skin: Negative for rash.  Neurological: Negative for dizziness, focal weakness, seizures, weakness and headaches.  Psychiatric/Behavioral: Negative for memory loss. The patient does not have insomnia.   Appears pale  Nutrition:  Tolerating Diet: Tolerating PT:  DRUG ALLERGIES:   Allergies  Allergen Reactions  . Codeine Nausea And Vomiting  . Doxycycline Nausea And Vomiting    VITALS:  Blood pressure 133/77, pulse 72, temperature 98.3 F (36.8 C), temperature source Oral, resp. rate 14, height 5\' 8"  (1.727 m), weight 75 kg, SpO2 97 %.  PHYSICAL EXAMINATION:   Physical Exam  GENERAL:  62 y.o.-year-old patient lying in the bed with no acute distress.  Appears pale EYES: Pupils equal, round, reactive to light  . No scleral icterus. Extraocular muscles intact.  HEENT: Head atraumatic, normocephalic. Oropharynx and nasopharynx clear.  NECK:  Supple, no jugular venous distention. No thyroid enlargement, no tenderness.  LUNGS: Normal breath sounds  bilaterally, no wheezing, rales,rhonchi or crepitation. No use of accessory muscles of respiration.  CARDIOVASCULAR: S1, S2 normal. No murmurs, rubs, or gallops.  ABDOMEN: Soft, nontender, nondistended. Bowel sounds present. No organomegaly or mass.  EXTREMITIES: No pedal edema, cyanosis, or clubbing.  NEUROLOGIC: Cranial nerves II through XII are intact. Muscle strength 5/5 in all extremities. Sensation intact. Gait not checked.  PSYCHIATRIC: The patient is alert and oriented x 3.  SKIN: No obvious rash, lesion, or ulcer.    LABORATORY PANEL:   CBC Recent Labs  Lab 09/03/18 2058  09/07/18 0622  WBC 5.2  --   --   HGB 7.9*   < > 7.2*  HCT 22.4*  --   --   PLT 167  --   --    < > = values in this interval not displayed.   ------------------------------------------------------------------------------------------------------------------  Chemistries  Recent Labs  Lab 09/01/18 1544 09/06/18 0540  NA 137 135  K 3.4* 4.5  CL 94* 95*  CO2 26 25  GLUCOSE 122* 86  BUN 45* 77*  CREATININE 5.01* 10.33*  CALCIUM 8.0* 9.3  AST 13*  --   ALT 10  --   ALKPHOS 87  --   BILITOT 0.5  --    ------------------------------------------------------------------------------------------------------------------  Cardiac Enzymes No results for input(s): TROPONINI in the last 168 hours. ------------------------------------------------------------------------------------------------------------------  RADIOLOGY:  No results found.   ASSESSMENT AND PLAN:   Active Problems:   Anemia   #1. Symptomatic anemia due to GIB, status post EGD showing gastritis, duodenitis, discharged home with Protonix but patient felt weak and passed out at dialysis, hemoglobin down to 7.2 , s/p 5 unit  of packed RBC due to decreased hemoglobin. Hb 7.2. Need 1 more units PRBC. EGD: Erythematous mucosa in the antrum. Erythematous duodenopathy. Perform an H. pylori serology. Change PPI to PO once daily for 30  days. Colonoscopy: Blood clots in colon suggest anemia and bleeding to                        be likely from the more proximal colon than the extent                        of the exam. That is, the cecum or proximal ascending                        colon, or from the small bowel.  Bleeding scan: No source of bleeding. Capsule started today per Dr. Vicente Males.  ESRD: Hemodialysis on Monday, Wednesday, Friday.  Urinary retention.  Continue Flomax, cath in and out.  Syncope due to symptomatic anemia, status post blood transfusion,  I discussed with Dr. Vicente Males. . 50% time spent in counseling, coordination of care All the records are reviewed and case discussed with Care Management/Social Workerr. Management plans discussed with the patient, family and they are in agreement.  CODE STATUS: Full code  TOTAL TIME TAKING CARE OF THIS PATIENT: 27 minutes.   POSSIBLE D/C IN 2 DAYS, DEPENDING ON CLINICAL CONDITION.   Demetrios Loll M.D on 09/07/2018 at 4:39 PM  Between 7am to 6pm - Pager - [phone]  After 6pm go to [email or web address] - password [email or web address] [facility]  [facility]  Office  [phone]  CC: Primary care physician; Freddy Finner, NP

## 2018-09-08 ENCOUNTER — Encounter: Payer: Self-pay | Admitting: Gastroenterology

## 2018-09-08 ENCOUNTER — Inpatient Hospital Stay: Payer: Medicare PPO

## 2018-09-08 DIAGNOSIS — N186 End stage renal disease: Secondary | ICD-10-CM | POA: Insufficient documentation

## 2018-09-08 DIAGNOSIS — J45909 Unspecified asthma, uncomplicated: Secondary | ICD-10-CM | POA: Insufficient documentation

## 2018-09-08 DIAGNOSIS — K922 Gastrointestinal hemorrhage, unspecified: Secondary | ICD-10-CM | POA: Insufficient documentation

## 2018-09-08 DIAGNOSIS — D62 Acute posthemorrhagic anemia: Secondary | ICD-10-CM | POA: Insufficient documentation

## 2018-09-08 LAB — RENAL FUNCTION PANEL
ALBUMIN: 2.5 g/dL — AB (ref 3.5–5.0)
Anion gap: 9 (ref 5–15)
BUN: 50 mg/dL — AB (ref 8–23)
CALCIUM: 8.9 mg/dL (ref 8.9–10.3)
CO2: 30 mmol/L (ref 22–32)
Chloride: 96 mmol/L — ABNORMAL LOW (ref 98–111)
Creatinine, Ser: 8.75 mg/dL — ABNORMAL HIGH (ref 0.61–1.24)
GFR calc Af Amer: 7 mL/min — ABNORMAL LOW (ref >60)
GFR calc non Af Amer: 6 mL/min — ABNORMAL LOW (ref >60)
Glucose, Bld: 124 mg/dL — ABNORMAL HIGH (ref 70–99)
PHOSPHORUS: 4.7 mg/dL — AB (ref 2.5–4.6)
POTASSIUM: 4.4 mmol/L (ref 3.5–5.1)
SODIUM: 135 mmol/L (ref 135–145)

## 2018-09-08 LAB — PREPARE RBC (CROSSMATCH)

## 2018-09-08 LAB — HEMOGLOBIN: Hemoglobin: 6.7 g/dL — ABNORMAL LOW (ref 13.0–17.0)

## 2018-09-08 MED ORDER — ASCORBIC ACID 500 MG/ML IJ SOLN: 500.0000 mg | Freq: Two times a day (BID) | INTRAMUSCULAR | Status: DC

## 2018-09-08 MED ORDER — BOOST / RESOURCE BREEZE PO LIQD CUSTOM: 1.0000 | Freq: Three times a day (TID) | ORAL | Status: DC

## 2018-09-08 MED ORDER — VITAMIN C 500 MG PO TABS: 500.0000 mg | ORAL_TABLET | Freq: Two times a day (BID) | ORAL | Status: DC

## 2018-09-08 MED ORDER — RENA-VITE PO TABS: 1.0000 | ORAL_TABLET | Freq: Every day | ORAL | Status: DC

## 2018-09-08 MED ORDER — DEXTROSE 5 % IV SOLN
Freq: Two times a day (BID) | INTRAVENOUS | Status: DC
  Filled 2018-09-08 (×2): qty 1

## 2018-09-08 MED ORDER — TECHNETIUM TC 99M-LABELED RED BLOOD CELLS IV KIT
20.0000 | PACK | Freq: Once | INTRAVENOUS | Status: AC | PRN
  Administered 2018-09-08: 22.52 via INTRAVENOUS
  Filled 2018-09-08: qty 20

## 2018-09-08 MED ORDER — EPOETIN ALFA 10000 UNIT/ML IJ SOLN
10000.0000 [IU] | Freq: Once | INTRAMUSCULAR | Status: AC
  Administered 2018-09-08: 10000 [IU] via INTRAVENOUS

## 2018-09-08 MED ORDER — TAMSULOSIN HCL 0.4 MG PO CAPS: 0.4000 mg | ORAL_CAPSULE | Freq: Every day | ORAL | Status: DC

## 2018-09-08 MED ORDER — ACETAMINOPHEN 325 MG PO TABS
650.0000 mg | ORAL_TABLET | Freq: Once | ORAL | Status: AC
  Administered 2018-09-08: 650 mg via ORAL
  Filled 2018-09-08: qty 2

## 2018-09-08 MED ORDER — SODIUM CHLORIDE 0.9% IV SOLUTION: Freq: Once | INTRAVENOUS | Status: DC

## 2018-09-08 MED ORDER — FUROSEMIDE 10 MG/ML IJ SOLN
20.0000 mg | Freq: Once | INTRAMUSCULAR | Status: AC
  Administered 2018-09-08: 20 mg via INTRAVENOUS
  Filled 2018-09-08: qty 4

## 2018-09-08 NOTE — Discharge Summary (Addendum)
[place] [facility] [place] NAME: Jeremy Rose    MR#:  [id number]  DATE OF BIRTH:  Jan 22, 1956  DATE OF ADMISSION:  09/01/2018   ADMITTING PHYSICIAN: Demetrios Loll, MD  DATE OF DISCHARGE: 09/08/2018 PRIMARY CARE PHYSICIAN: Freddy Finner, NP   ADMISSION DIAGNOSIS:  Syncope, unspecified syncope type [R55] Anemia, unspecified type [D64.9] Non-intractable vomiting with nausea, unspecified vomiting type [R11.2] DISCHARGE DIAGNOSIS:  Active Problems:   Anemia  SECONDARY DIAGNOSIS:   Past Medical History:  Diagnosis Date  . Arthritis    hands  . Asthma    uses inhaler  . Coronary artery disease   . Dialysis patient [facility]) 2012   Monday, Wednesday, Friday  . GERD (gastroesophageal reflux disease)   . Gout of right hand   . Hypertension   . Renal disorder    stage V..on dialysis  . Seizures ([place]) 2019   last seizure 5 years ago. had 20 in one day   HOSPITAL COURSE:   #1. Symptomatic anemia due to GIB, status post EGD showing gastritis, duodenitis, discharged home with Protonix but patient felt weak and passed out at dialysis, hemoglobin down to 7.2 , s/p 5 unit of packed RBC due to decreased hemoglobin. Hb 7.2. Need 1 more units PRBC. EGD: Erythematous mucosa in the antrum. Erythematous duodenopathy. Perform an H. pylori serology. Change PPI to PO once daily for 30 days. Colonoscopy: Blood clots in colon suggest anemia and bleeding to  be likely from the more proximal colon than the extent  of the exam. That is, the cecum or proximal ascending  colon, or from the small bowel.  Bleeding scan: No source of bleeding. Capsule studies show active bleeding per Dr. Vicente Males. Hemoglobin decreased to 6.7.  I ordered 2 unit PRBC transfusion during hemodialysis. Per Dr. Vicente Males, the patient need to be transferred to [facility] or [facility] for enteroscopy. I discussed with [facility] GI physician  and the hospitalist.  Per [facility] GI physician, they cannot do enteroscopy in [facility].  I called [facility] transfer center and discussed with hospitalist Dr. Thayer Jew, who will discuss with GI physician but no bed available at this time. Bleeding and Meckel's nuclear scan for Dr. Vicente Males.  ESRD: Hemodialysis on Monday, Wednesday, Friday.  Analysis today.  Urinary retention.  Continue Flomax, cath in and out prn.  Syncope due to symptomatic anemia, status post blood transfusion,  I discussed with Dr. Vicente Males. DISCHARGE CONDITIONS:  Guarded, bed is offered, transfer to [facility] hospital. CONSULTS OBTAINED:  Treatment Team:  Lucilla Lame, MD Jonathon Bellows, MD DRUG ALLERGIES:   Allergies  Allergen Reactions  . Codeine Nausea And Vomiting  . Doxycycline Nausea And Vomiting   DISCHARGE MEDICATIONS:   Allergies as of 09/08/2018      Reactions   Codeine Nausea And Vomiting   Doxycycline Nausea And Vomiting      Medication List    STOP taking these medications   aspirin EC 81 MG tablet   carvedilol 10 MG 24 hr capsule Commonly known as:  COREG CR   felodipine 2.5 MG 24 hr tablet Commonly known as:  PLENDIL     TAKE these medications   acetaminophen 500 MG tablet Commonly known as:  TYLENOL Take 1 tablet (500 mg total) by mouth every 6 (six) hours as needed for mild pain.   albuterol 108 (90 Base) MCG/ACT inhaler Commonly known as:  PROVENTIL HFA;VENTOLIN HFA Inhale into the lungs every 6 (six) hours as needed for wheezing or  shortness of breath.   allopurinol 100 MG tablet Commonly known as:  ZYLOPRIM Take 200 mg by mouth daily.   beclomethasone 80 MCG/ACT inhaler Commonly known as:  QVAR Inhale 1 puff into the lungs as needed (for shortness of breath).   cinacalcet 60 MG tablet Commonly known as:  SENSIPAR Take 120 mg by mouth daily.   cyanocobalamin 1000 MCG tablet Take 1,000 mcg daily by mouth.   levETIRAcetam 500 MG tablet Commonly known as:  KEPPRA Take 500 mg by mouth 2  (two) times daily. Take a 3rd dose of 500 mgs after dialysis only on dialysis days (M-W-F)   oxyCODONE-acetaminophen 5-325 MG tablet Commonly known as:  PERCOCET/ROXICET Take 1 tablet by mouth every 4 (four) hours as needed for severe pain.   pantoprazole 40 MG tablet Commonly known as:  PROTONIX Take 1 tablet (40 mg total) by mouth 2 (two) times daily.   sevelamer carbonate 800 MG tablet Commonly known as:  RENVELA Take 2,400 mg by mouth 3 (three) times daily with meals.   tamsulosin 0.4 MG Caps capsule Commonly known as:  FLOMAX Take 1 capsule (0.4 mg total) by mouth daily after supper.   VIMPAT 150 MG Tabs Generic drug:  Lacosamide Take 150 mg by mouth 2 (two) times daily. Take an 3rd dose after dialysis on M-W-F        DISCHARGE INSTRUCTIONS:  See AVS.  If you experience worsening of your admission symptoms, develop shortness of breath, life threatening emergency, suicidal or homicidal thoughts you must seek medical attention immediately by calling 911 or calling your MD immediately  if symptoms less severe.  You Must read complete instructions/literature along with all the possible adverse reactions/side effects for all the Medicines you take and that have been prescribed to you. Take any new Medicines after you have completely understood and accpet all the possible adverse reactions/side effects.   Please note  You were cared for by a hospitalist during your hospital stay. If you have any questions about your discharge medications or the care you received while you were in the hospital after you are discharged, you can call the unit and asked to speak with the hospitalist on call if the hospitalist that took care of you is not available. Once you are discharged, your primary care physician will handle any further medical issues. Please note that NO REFILLS for any discharge medications will be authorized once you are discharged, as it is imperative that you return to your  primary care physician (or establish a relationship with a primary care physician if you do not have one) for your aftercare needs so that they can reassess your need for medications and monitor your lab values.    On the day of Discharge:  VITAL SIGNS:  Blood pressure (!) 105/58, pulse 82, temperature 98.2 F (36.8 C), temperature source Oral, resp. rate 16, height 5\' 8"  (1.727 m), weight 74.4 kg, SpO2 98 %. PHYSICAL EXAMINATION:  GENERAL:  62 y.o.-year-old patient lying in the bed with no acute distress.  EYES: Pupils equal, round, reactive to light and accommodation. No scleral icterus. Extraocular muscles intact.  HEENT: Head atraumatic, normocephalic. Oropharynx and nasopharynx clear.  NECK:  Supple, no jugular venous distention. No thyroid enlargement, no tenderness.  LUNGS: Normal breath sounds bilaterally, no wheezing, rales,rhonchi or crepitation. No use of accessory muscles of respiration.  CARDIOVASCULAR: S1, S2 normal. No murmurs, rubs, or gallops.  ABDOMEN: Soft, non-tender, non-distended. Bowel sounds present. No organomegaly or mass.  EXTREMITIES:  No pedal edema, cyanosis, or clubbing.  NEUROLOGIC: Cranial nerves II through XII are intact. Muscle strength 5/5 in all extremities. Sensation intact. Gait not checked.  PSYCHIATRIC: The patient is alert and oriented x 3.  SKIN: No obvious rash, lesion, or ulcer.  DATA REVIEW:   CBC Recent Labs  Lab 09/07/18 2329 09/08/18 0257  WBC 4.9  --   HGB 6.9* 6.7*  HCT 20.6*  --   PLT 171  --     Chemistries  Recent Labs  Lab 09/07/18 2329  NA 135  K 4.4  CL 96*  CO2 30  GLUCOSE 124*  BUN 50*  CREATININE 8.75*  CALCIUM 8.9     Microbiology Results  Results for orders placed or performed during the hospital encounter of 08/30/18  MRSA PCR Screening     Status: None   Collection Time: 08/30/18  9:15 PM  Result Value Ref Range Status   MRSA by PCR NEGATIVE NEGATIVE Final    Comment:        The GeneXpert MRSA Assay  (FDA approved for NASAL specimens only), is one component of a comprehensive MRSA colonization surveillance program. It is not intended to diagnose MRSA infection nor to guide or monitor treatment for MRSA infections. Performed at [facility], [address], [place], [place] [address]     RADIOLOGY:  No results found.   Management plans discussed with the patient, family and they are in agreement.  CODE STATUS: Full Code   TOTAL TIME TAKING CARE OF THIS PATIENT: 36 minutes.    Demetrios Loll M.D on 09/08/2018 at 5:05 PM  Between 7am to 6pm - Pager - [phone]  After 6pm go to [email or web address] - Proofreader  [facility]  Office  [phone]  CC: Primary care physician; Freddy Finner, NP   Note: This dictation was prepared with Dragon dictation along with smaller phrase technology. Any transcriptional errors that result from this process are unintentional.

## 2018-09-08 NOTE — Progress Notes (Signed)
Report called to Merleen Nicely, Therapist, sports at [facility]. Sister updated of transfer. [facility] called for transport.

## 2018-09-08 NOTE — Progress Notes (Signed)
[name] NAME: Jeremy Rose    MR#:  [id number]  DATE OF BIRTH:  07-02-1956  Seen at bedside, patient denies any black stools since admission, received 1 unit   CHIEF COMPLAINT:   Chief Complaint  Patient presents with  . Nausea  . Emesis   The patient still has melena today.  Capsule study show active bleeding.  Hemoglobin decreased to 6.7. REVIEW OF SYSTEMS:    Review of Systems  Constitutional: Positive for malaise/fatigue. Negative for chills and fever.  HENT: Negative for hearing loss.   Eyes: Negative for blurred vision, double vision and photophobia.  Respiratory: Negative for cough, hemoptysis and shortness of breath.   Cardiovascular: Negative for palpitations, orthopnea and leg swelling.  Gastrointestinal: Positive for blood in stool. Negative for abdominal pain, diarrhea, melena, nausea and vomiting.  Genitourinary: Negative for dysuria and urgency.  Musculoskeletal: Negative for myalgias and neck pain.  Skin: Negative for rash.  Neurological: Negative for dizziness, focal weakness, seizures, weakness and headaches.  Psychiatric/Behavioral: Negative for memory loss. The patient does not have insomnia.   Appears pale  Nutrition:  Tolerating Diet: Tolerating PT:  DRUG ALLERGIES:   Allergies  Allergen Reactions  . Codeine Nausea And Vomiting  . Doxycycline Nausea And Vomiting    VITALS:  Blood pressure (!) 105/58, pulse 82, temperature 98.2 F (36.8 C), temperature source Oral, resp. rate 16, height 5\' 8"  (1.727 m), weight 76 kg, SpO2 98 %.  PHYSICAL EXAMINATION:   Physical Exam  GENERAL:  63 y.o.-year-old patient lying in the bed with no acute distress.  Appears pale EYES: Pupils equal, round, reactive to light  . No scleral icterus. Extraocular muscles intact.  HEENT: Head atraumatic, normocephalic. Oropharynx and nasopharynx clear.  NECK:  Supple, no jugular venous distention. No  thyroid enlargement, no tenderness.  LUNGS: Normal breath sounds bilaterally, no wheezing, rales,rhonchi or crepitation. No use of accessory muscles of respiration.  CARDIOVASCULAR: S1, S2 normal. No murmurs, rubs, or gallops.  ABDOMEN: Soft, nontender, nondistended. Bowel sounds present. No organomegaly or mass.  EXTREMITIES: No pedal edema, cyanosis, or clubbing.  NEUROLOGIC: Cranial nerves II through XII are intact. Muscle strength 5/5 in all extremities. Sensation intact. Gait not checked.  PSYCHIATRIC: The patient is alert and oriented x 3.  SKIN: No obvious rash, lesion, or ulcer.    LABORATORY PANEL:   CBC Recent Labs  Lab 09/07/18 2329 09/08/18 0257  WBC 4.9  --   HGB 6.9* 6.7*  HCT 20.6*  --   PLT 171  --    ------------------------------------------------------------------------------------------------------------------  Chemistries  Recent Labs  Lab 09/01/18 1544  09/07/18 2329  NA 137   < > 135  K 3.4*   < > 4.4  CL 94*   < > 96*  CO2 26   < > 30  GLUCOSE 122*   < > 124*  BUN 45*   < > 50*  CREATININE 5.01*   < > 8.75*  CALCIUM 8.0*   < > 8.9  AST 13*  --   --   ALT 10  --   --   ALKPHOS 87  --   --   BILITOT 0.5  --   --    < > = values in this interval not displayed.   ------------------------------------------------------------------------------------------------------------------  Cardiac Enzymes No results for input(s): TROPONINI in the last 168 hours. ------------------------------------------------------------------------------------------------------------------  RADIOLOGY:  No results found.   ASSESSMENT AND PLAN:  Active Problems:   Anemia   #1. Symptomatic anemia due to GIB, status post EGD showing gastritis, duodenitis, discharged home with Protonix but patient felt weak and passed out at dialysis, hemoglobin down to 7.2 , s/p 5 unit of packed RBC due to decreased hemoglobin. Hb 7.2. Need 1 more units PRBC. EGD: Erythematous mucosa  in the antrum. Erythematous duodenopathy. Perform an H. pylori serology. Change PPI to PO once daily for 30 days. Colonoscopy: Blood clots in colon suggest anemia and bleeding to                        be likely from the more proximal colon than the extent                        of the exam. That is, the cecum or proximal ascending                        colon, or from the small bowel.  Bleeding scan: No source of bleeding. Capsule studies show active bleeding per Dr. Vicente Males. Hemoglobin decreased to 6.7.  I ordered 2 unit PRBC transfusion during hemodialysis. Per Dr. Vicente Males, the patient need to be transferred to [facility] or [facility] for enteroscopy. I discussed with [facility] GI physician and the hospitalist.  Per [facility] GI physician, they cannot do enteroscopy in [facility].  I called [facility] transfer center and discussed with hospitalist Dr. Thayer Jew, who will discuss with GI physician but no bed available at this time. Meckel's nuclear scan for Dr. Vicente Males.  ESRD: Hemodialysis on Monday, Wednesday, Friday.  Analysis today.  Urinary retention.  Continue Flomax, cath in and out prn.  Syncope due to symptomatic anemia, status post blood transfusion,  I discussed with Dr. Vicente Males. . 50% time spent in counseling, coordination of care All the records are reviewed and case discussed with Care Management/Social Workerr. Management plans discussed with the patient, family and they are in agreement.  CODE STATUS: Full code  TOTAL TIME TAKING CARE OF THIS PATIENT: 48 minutes.   POSSIBLE D/C IN 2 DAYS, DEPENDING ON CLINICAL CONDITION.   Demetrios Loll M.D on 09/08/2018 at 3:00 PM  Between 7am to 6pm - Pager - [phone]  After 6pm go to [email or web address] - password [email or web address] [facility]  [place] [facility]  Office  [phone]  CC: Primary care physician; Freddy Finner, NP

## 2018-09-08 NOTE — Progress Notes (Signed)
Initial Nutrition Assessment  DOCUMENTATION CODES:   Not applicable  INTERVENTION:   Boost Breeze po TID, each supplement provides 250 kcal and 9 grams of protein  Rena-vite daily  Vitamin C 500mg  IV BID x 5 days followed by 500mg  po BID   NUTRITION DIAGNOSIS:   Increased nutrient needs related to chronic illness(ESRD on HD) as evidenced by increased estimated needs.  GOAL:   Patient will meet greater than or equal to 90% of their needs  MONITOR:   PO intake, Supplement acceptance, Labs, Weight trends, Skin, I & O's  REASON FOR ASSESSMENT:   NPO/Clear Liquid Diet    ASSESSMENT:   62 y.o. white male with end-stage renal disease on dialysis for for 5 years, coronary disease, history of seizures, was admitted on 09/01/2018 for syncope, anemia and GIB   Unable to see pt today as pt in HD and then having GI procedure at time of RD visits. Pt currently on clear liquid diet; documented to be eating 100% of meals. Per chart, pt appears weight stable pta. Pt at high risk for scurvy r/t chronic HD. Low ascorbic acid can exacerbate GI bleeding. Spoke to MD, will send off ascorbic acid labs and initiate IV vitamin C regimen. RD will obtain nutrition related history and exam at follow up.   Medications reviewed and include: allopurinol, cinacalcet, protonix, renvela, B12, dulcolax, zofran, senokot   Labs reviewed: Cl 96(L), BUN 50(H), creat 8.75(H), P 4.7(H) Iron 23(L), TIBC 189(L), ferritin 512(H), folate 11.5- 10/21 B12- 691- 10/21 Hgb 6.7(L) Hct 20.6(L)- 10/22  Diet Order:   Diet Order            Diet clear liquid Room service appropriate? Yes; Fluid consistency: Thin  Diet effective now             EDUCATION NEEDS:   Education needs have been addressed  Skin:  Skin Assessment: Reviewed RN Assessment  Last BM:  10/23- type 7  Height:   Ht Readings from Last 1 Encounters:  09/01/18 5\' 8"  (1.727 m)    Weight:   Wt Readings from Last 1 Encounters:  09/08/18  76 kg    Ideal Body Weight:  70 kg  BMI:  Body mass index is 25.48 kg/m.  Estimated Nutritional Needs:   Kcal:  2000-2300kcal/day   Protein:  98-113g/day   Fluid:  UOP + 1L  Koleen Distance MS, RD, LDN Pager #- [phone] Office#- [phone] After Hours Pager: [phone]

## 2018-09-08 NOTE — Progress Notes (Signed)
Post HD Assessment    09/08/18 1400  Neurological  Level of Consciousness Alert  Orientation Level Oriented X4  Respiratory  Respiratory Pattern Regular;Unlabored;Symmetrical  Chest Assessment Chest expansion symmetrical  Bilateral Breath Sounds Clear  Cardiac  Pulse Regular  Heart Sounds S1, S2  Jugular Venous Distention (JVD) No  ECG Monitor Yes  Cardiac Rhythm NSR  Antiarrhythmic device No  Vascular  R Radial Pulse +2  L Radial Pulse +2  R Dorsalis Pedis Pulse +1  L Dorsalis Pedis Pulse +1  Generalized Edema None  Integumentary  Integumentary (WDL) WDL  Skin Color Appropriate for ethnicity  Skin Condition Dry  Skin Integrity Intact  Musculoskeletal  Musculoskeletal (WDL) WDL  Generalized Weakness Yes  GU Assessment  Genitourinary (WDL) X (HD pt)  Psychosocial  Psychosocial (WDL) WDL

## 2018-09-08 NOTE — Progress Notes (Signed)
HD Treatment Initiated    09/08/18 0955  Vital Signs  Pulse Rate 88  Pulse Rate Source Monitor  Resp 14  Oxygen Therapy  SpO2 100 %  O2 Device Room Air  During Hemodialysis Assessment  Blood Flow Rate (mL/min) 400 mL/min  Arterial Pressure (mmHg) -90 mmHg  Venous Pressure (mmHg) 190 mmHg  Transmembrane Pressure (mmHg) 60 mmHg  Ultrafiltration Rate (mL/min) 570 mL/min  Dialysate Flow Rate (mL/min) 800 ml/min  Conductivity: Machine  13.6  HD Safety Checks Performed Yes  Dialysis Fluid Bolus Normal Saline  Bolus Amount (mL) 250 mL  Intra-Hemodialysis Comments Tx initiated

## 2018-09-08 NOTE — Care Management Important Message (Signed)
Copy of signed IM left in patient's room (out for procedure). 

## 2018-09-08 NOTE — Progress Notes (Signed)
Bed available at [facility].

## 2018-09-08 NOTE — Progress Notes (Signed)
HD Treatment Complete    09/08/18 1336  Vital Signs  Pulse Rate 87  Pulse Rate Source Monitor  Resp 16  BP 107/65  BP Location Right Arm  BP Method Automatic  Patient Position (if appropriate) Lying  Oxygen Therapy  SpO2 100 %  O2 Device Room Air  During Hemodialysis Assessment  Intra-Hemodialysis Comments Progressing as prescribed;Tx completed (UF 2753)  Fistula / Graft Left Upper arm Arteriovenous vein graft  Placement Date/Time: 05/28/18 1333   Placed prior to admission: No  Orientation: Left  Access Location: Upper arm  Access Type: Arteriovenous vein graft  Status Deaccessed

## 2018-09-08 NOTE — Progress Notes (Addendum)
Capsule study interpreted Active bleeding seen in distal small bowel . Distal to the area of bleeding, limited visualization due to large qty of blood  1. Transfer to [facility] or [facility] for deep enteroscopy- retrograde 2. While awaiting transfer obtain tagged RBC scan and if positive consult vascular surgery for embolization  3. If has profuse active bleeding consult vascular or general surgery for intervention.  Plan discussed with Dr Bridgett Larsson   Dr Jonathon Bellows MD,MRCP [facility]) Gastroenterology/Hepatology Pager: [phone]

## 2018-09-08 NOTE — Progress Notes (Signed)
[facility]  ROUNDING NOTE   Subjective:   Seen and examined on hemodialysis. Tolerating treatment well. UF goal of even.   2 units PRBC    HEMODIALYSIS FLOWSHEET:  Blood Flow Rate (mL/min): 400 mL/min Arterial Pressure (mmHg): -200 mmHg Venous Pressure (mmHg): 270 mmHg Transmembrane Pressure (mmHg): 60 mmHg Ultrafiltration Rate (mL/min): 1030 mL/min Dialysate Flow Rate (mL/min): 800 ml/min Conductivity: Machine : 13.5 Conductivity: Machine : 13.5 Dialysis Fluid Bolus: Normal Saline Bolus Amount (mL): 250 mL    Objective:  Vital signs in last 24 hours:  Temp:  [97.6 F (36.4 C)-98.3 F (36.8 C)] 98.2 F (36.8 C) (10/23 1345) Pulse Rate:  [75-96] 82 (10/23 1345) Resp:  [13-26] 16 (10/23 1345) BP: (94-137)/(54-93) 105/58 (10/23 1345) SpO2:  [96 %-100 %] 98 % (10/23 1345) Weight:  [74.4 kg-76 kg] 74.4 kg (10/23 1345)  Weight change:  Filed Weights   09/06/18 1349 09/08/18 0945 09/08/18 1345  Weight: 75 kg 76 kg 74.4 kg    Intake/Output: I/O last 3 completed shifts: In: 895.1 [P.O.:480; I.V.:28.5; Blood:386.6] Out: 250 [Urine:250]   Intake/Output this shift:  Total I/O In: 760 [Blood:760] Out: 1500 [Other:1500]  Physical Exam: General: NAD  Head: Normocephalic, atraumatic. Moist oral mucosal membranes  Eyes: Anicteric  Neck: Supple, trachea midline  Lungs:  Clear to auscultation  Heart: Regular rate and rhythm  Abdomen:  Soft, nontender  Extremities: no peripheral edema.  Neurologic: Nonfocal, moving all four extremities  Skin: No lesions  Access: Left AVF    Basic Metabolic Panel: Recent Labs  Lab 09/06/18 0540 09/07/18 2329  NA 135 135  K 4.5 4.4  CL 95* 96*  CO2 25 30  GLUCOSE 86 124*  BUN 77* 50*  CREATININE 10.33* 8.75*  CALCIUM 9.3 8.9  PHOS  --  4.7*    Liver Function Tests: Recent Labs  Lab 09/07/18 2329  ALBUMIN 2.5*   No results for input(s): LIPASE, AMYLASE in the last 168 hours. No results for input(s):  AMMONIA in the last 168 hours.  CBC: Recent Labs  Lab 09/02/18 1020 09/03/18 1024 09/03/18 1316 09/03/18 2058  09/06/18 0540 09/07/18 0622 09/07/18 1838 09/07/18 2329 09/08/18 0257  WBC 8.1 5.8  --  5.2  --   --   --   --  4.9  --   HGB 7.0* 5.8* 6.1* 7.9*   < > 7.0* 7.2* 8.0* 6.9* 6.7*  HCT 20.4* 17.5* 18.3* 22.4*  --   --   --  23.4* 20.6*  --   MCV 93.2 94.6  --  90.0  --   --   --   --  94.1  --   PLT 169 162  --  167  --   --   --   --  171  --    < > = values in this interval not displayed.    Cardiac Enzymes: No results for input(s): CKTOTAL, CKMB, CKMBINDEX, TROPONINI in the last 168 hours.  BNP: Invalid input(s): POCBNP  CBG: No results for input(s): GLUCAP in the last 168 hours.  Microbiology: Results for orders placed or performed during the hospital encounter of 08/30/18  MRSA PCR Screening     Status: None   Collection Time: 08/30/18  9:15 PM  Result Value Ref Range Status   MRSA by PCR NEGATIVE NEGATIVE Final    Comment:        The GeneXpert MRSA Assay (FDA approved for NASAL specimens only), is one component of a comprehensive  MRSA colonization surveillance program. It is not intended to diagnose MRSA infection nor to guide or monitor treatment for MRSA infections. Performed at [facility], [address] [place], [place] [address]     Coagulation Studies: No results for input(s): LABPROT, INR in the last 72 hours.  Urinalysis: No results for input(s): COLORURINE, LABSPEC, PHURINE, GLUCOSEU, HGBUR, BILIRUBINUR, KETONESUR, PROTEINUR, UROBILINOGEN, NITRITE, LEUKOCYTESUR in the last 72 hours.  Invalid input(s): APPERANCEUR    Imaging: No results found.   Medications:   . sodium chloride    . sodium chloride    . sodium chloride    . sodium chloride    . small volume/piggyback Museum/gallery curator     . sodium chloride   Intravenous Once  . sodium chloride   Intravenous Once  . sodium chloride   Intravenous Once  . allopurinol  200  mg Oral Daily  . Chlorhexidine Gluconate Cloth  6 each Topical Q0600  . Chlorhexidine Gluconate Cloth  6 each Topical Q0600  . cinacalcet  120 mg Oral Q breakfast  . feeding supplement  1 Container Oral TID BM  . lacosamide  150 mg Oral BID  . lacosamide  150 mg Oral Q M,W,F-HD  . levETIRAcetam  500 mg Oral BID  . levETIRAcetam  500 mg Oral Q M,W,F-HD  . multivitamin  1 tablet Oral QHS  . pantoprazole  40 mg Oral Daily  . sevelamer carbonate  2,400 mg Oral TID WC  . sodium chloride flush  3 mL Intravenous Q12H  . tamsulosin  0.4 mg Oral QPC supper  . cyanocobalamin  1,000 mcg Oral Daily  . [START ON 09/13/2018] vitamin C  500 mg Oral BID   sodium chloride, sodium chloride, sodium chloride, acetaminophen **OR** acetaminophen, albuterol, alteplase, bisacodyl, budesonide (PULMICORT) nebulizer solution, heparin, lidocaine (PF), lidocaine-prilocaine, ondansetron **OR** ondansetron (ZOFRAN) IV, pentafluoroprop-tetrafluoroeth, senna-docusate, sodium chloride flush  Assessment/ Plan:  [name] is a 62 y.o. white male with end-stage renal disease on dialysis for for 5 years, coronary disease, history of seizures, was admitted on 09/01/2018 for Syncope, unspecified syncope type [R55] Anemia, unspecified type [D64.9] Non-intractable vomiting with nausea, unspecified vomiting type [R11.2]  [facility]/MWF  1. End Stage Renal Disease:  Patient completed hemodialysis yesterday.  No acute indication for dialysis today.  We will plan for dialysis again tomorrow.  2. Anemia of chronic kidney disease and GI blood loss: with melana for last few days.  2 units PRBC today with treatment - transfer to tertiary care center per GI.   3. Secondary Hyperparathyroidism:  - continue Renvela and Sensipar.  4. Hypertension: Currently off of antihypertensives.  5. Urinary Retention - in and out catheterization - Trial of tamsulosin.    LOS: 5 Jeremy Rose 10/23/20195:27 PM

## 2018-09-08 NOTE — Progress Notes (Signed)
Post HD Treatment  Pt tolerated treatment well. His net UF was 1500.His BVP was 77.9. Pt received Epogen and two units of blood this treatment. All blood returned back to patient during rinseback. Report called to primary nurse Geanna. No complaints noted.     09/08/18 1345  Vital Signs  Temp 98.2 F (36.8 C)  Temp Source Oral  Pulse Rate 82  Pulse Rate Source Monitor  Resp 16  BP (!) 105/58  BP Location Right Arm  BP Method Automatic  Patient Position (if appropriate) Lying  Oxygen Therapy  SpO2 98 %  O2 Device Room Air  Pain Assessment  Pain Scale 0-10  Pain Score 0  Post-Hemodialysis Assessment  Rinseback Volume (mL) 250 mL  KECN 281 V  Dialyzer Clearance Lightly streaked  Duration of HD Treatment -hour(s) 3.5 hour(s)  Hemodialysis Intake (mL) 1260 mL  UF Total -Machine (mL) 2760 mL  Net UF (mL) 1500 mL  Tolerated HD Treatment Yes  Post-Hemodialysis Comments Pt tolerated well  AVG/AVF Arterial Site Held (minutes) 15 minutes  AVG/AVF Venous Site Held (minutes) 10 minutes  Fistula / Graft Left Upper arm Arteriovenous vein graft  Placement Date/Time: 05/28/18 1333   Placed prior to admission: No  Orientation: Left  Access Location: Upper arm  Access Type: Arteriovenous vein graft  Site Condition No complications  Fistula / Graft Assessment Present;Thrill;Bruit  Drainage Description None

## 2018-09-08 NOTE — Progress Notes (Signed)
Pre HD Treatment    09/08/18 0945  Vital Signs  Temp 98.3 F (36.8 C)  Temp Source Oral  Pulse Rate 87  Pulse Rate Source Monitor  Resp 14  BP 122/67  BP Location Right Arm  BP Method Automatic  Patient Position (if appropriate) Lying  Oxygen Therapy  SpO2 100 %  O2 Device Room Air  Pain Assessment  Pain Scale 0-10  Pain Score 0  POSS Scale ([place] Opioid Sedation Scale)  POSS *See Group Information* 1-Acceptable,Awake and alert  Dialysis Weight  Weight 76 kg  Type of Weight Pre-Dialysis  Time-Out for Hemodialysis  What Procedure? HD  Pt Identifiers(min of two) First/Last Name;MRN/Account#;Pt's DOB(use if MRN/Acct# not available  Correct Site? Yes  Correct Side? Yes  Correct Procedure? Yes  Consents Verified? Yes  Rad Studies Available? N/A  Safety Precautions Reviewed? Yes  Engineer, civil (consulting) Number  ([id number])  Station Number 4  UF/Alarm Test Passed  Conductivity: Meter 14  Conductivity: Machine  13.8  pH 7.4  Reverse Osmosis Main  Normal Saline Lot Number [id number]  Dialyzer Lot Number [id number]  Disposable Set Lot Number [id number]  Machine Temperature 98.6 F (37 C)  Musician and Audible Yes  Blood Lines Intact and Secured Yes  Pre Treatment Patient Checks  Vascular access used during treatment Graft  Hepatitis B Surface Antigen Results Negative  Date Hepatitis B Surface Antigen Drawn 08/18/18  Hepatitis B Surface Antibody  (<10)  Date Hepatitis B Surface Antibody Drawn 07/21/18  Hemodialysis Consent Verified Yes  Hemodialysis Standing Orders Initiated Yes  ECG (Telemetry) Monitor On Yes  Prime Ordered Normal Saline  Length of  DialysisTreatment -hour(s) 3.5 Hour(s)  Dialyzer Elisio 17H NR  Dialysate 2K, 2.5 Ca  Dialysis Anticoagulant None  Dialysate Flow Ordered 600  Blood Flow Rate Ordered 400 mL/min  Ultrafiltration Goal 1.5 Liters  Blood Products Ordered Packed Red Blood Cells  Dialysis Blood Pressure Support Ordered Normal Saline   Education / Care Plan  Dialysis Education Provided Yes  Documented Education in Care Plan Yes  Fistula / Graft Left Upper arm Arteriovenous vein graft  Placement Date/Time: 05/28/18 1333   Placed prior to admission: No  Orientation: Left  Access Location: Upper arm  Access Type: Arteriovenous vein graft  Site Condition No complications  Fistula / Graft Assessment Present;Thrill;Bruit  Drainage Description None

## 2018-09-08 NOTE — Progress Notes (Signed)
Pre HD Assessment    09/08/18 0950  Neurological  Level of Consciousness Alert  Orientation Level Oriented X4  Respiratory  Respiratory Pattern Regular;Unlabored;Symmetrical  Chest Assessment Chest expansion symmetrical  Bilateral Breath Sounds Clear  Cardiac  Pulse Regular  Heart Sounds S1, S2  Jugular Venous Distention (JVD) No  ECG Monitor Yes  Cardiac Rhythm NSR  Antiarrhythmic device No  Vascular  R Radial Pulse +2  L Radial Pulse +2  R Dorsalis Pedis Pulse +1  L Dorsalis Pedis Pulse +1  Generalized Edema None  Integumentary  Integumentary (WDL) WDL  Skin Color Appropriate for ethnicity  Skin Condition Dry  Skin Integrity Intact  Musculoskeletal  Musculoskeletal (WDL) WDL  Generalized Weakness Yes  Assistive Device None  GU Assessment  Genitourinary (WDL) X (HD pt)  Psychosocial  Psychosocial (WDL) WDL

## 2018-09-08 NOTE — Progress Notes (Signed)
Called Dr. Marcille Blanco regarding patient's Hgb- 6.7.  Appropriate orders will be placed with day shift hospitalist on call.  Christene Slates  09/08/2018 6:52 AM

## 2018-09-09 LAB — BPAM RBC
BLOOD PRODUCT EXPIRATION DATE: 201911092359
BLOOD PRODUCT EXPIRATION DATE: 201911222359
Blood Product Expiration Date: 201911232359
Blood Product Expiration Date: 201911232359
ISSUE DATE / TIME: 201910231037
ISSUE DATE / TIME: 201910211148
ISSUE DATE / TIME: 201910221407
ISSUE DATE / TIME: 201910231140
UNIT TYPE AND RH: 9500
UNIT TYPE AND RH: 9500
UNIT TYPE AND RH: 9500
Unit Type and Rh: 9500

## 2018-09-09 LAB — TYPE AND SCREEN
ABO/RH(D): O NEG
ANTIBODY SCREEN: NEGATIVE
UNIT DIVISION: 0
UNIT DIVISION: 0
Unit division: 0
Unit division: 0

## 2018-09-10 ENCOUNTER — Encounter: Payer: Self-pay | Admitting: Dietician

## 2018-09-10 LAB — MISC LABCORP TEST (SEND OUT)

## 2018-09-10 LAB — VITAMIN C: Vitamin C: <0.1 mg/dL — ABNORMAL LOW (ref 0.2–2.0)

## 2018-09-10 MED ORDER — CINACALCET HCL 30 MG PO TABS
120.00 | ORAL_TABLET | ORAL | Status: DC
Start: 2018-09-14 — End: 2018-09-10

## 2018-09-10 MED ORDER — GENERIC EXTERNAL MEDICATION
1000.00 | Status: DC
Start: 2018-09-14 — End: 2018-09-10

## 2018-09-10 MED ORDER — PANTOPRAZOLE SODIUM 40 MG PO TBEC
40.00 | DELAYED_RELEASE_TABLET | ORAL | Status: DC
Start: 2018-09-10 — End: 2018-09-10

## 2018-09-10 MED ORDER — ACETAMINOPHEN 325 MG PO TABS
650.00 | ORAL_TABLET | ORAL | Status: DC
Start: ? — End: 2018-09-10

## 2018-09-10 MED ORDER — GENERIC EXTERNAL MEDICATION
150.00 | Status: DC
Start: ? — End: 2018-09-10

## 2018-09-10 MED ORDER — LEVETIRACETAM 500 MG PO TABS
500.00 | ORAL_TABLET | ORAL | Status: DC
Start: ? — End: 2018-09-10

## 2018-09-10 MED ORDER — GENERIC EXTERNAL MEDICATION
750.00 | Status: DC
Start: ? — End: 2018-09-10

## 2018-09-10 MED ORDER — EPOETIN ALFA 2000 UNIT/ML IJ SOLN
50.00 | INTRAMUSCULAR | Status: DC
Start: ? — End: 2018-09-10

## 2018-09-10 MED ORDER — GLUCAGON HCL RDNA (DIAGNOSTIC) 1 MG IJ SOLR
1.00 | INTRAMUSCULAR | Status: DC
Start: ? — End: 2018-09-10

## 2018-09-10 MED ORDER — GENERIC EXTERNAL MEDICATION
Status: DC
Start: ? — End: 2018-09-10

## 2018-09-10 MED ORDER — GENERIC EXTERNAL MEDICATION
3.38 | Status: DC
Start: 2018-09-13 — End: 2018-09-10

## 2018-09-10 MED ORDER — ALLOPURINOL 100 MG PO TABS
200.00 | ORAL_TABLET | ORAL | Status: DC
Start: 2018-09-14 — End: 2018-09-10

## 2018-09-10 MED ORDER — GENERIC EXTERNAL MEDICATION
150.00 | Status: DC
Start: 2018-09-13 — End: 2018-09-10

## 2018-09-10 MED ORDER — DEXTROSE 50 % IV SOLN
12.50 | INTRAVENOUS | Status: DC
Start: ? — End: 2018-09-10

## 2018-09-10 MED ORDER — LEVETIRACETAM 500 MG PO TABS
500.00 | ORAL_TABLET | ORAL | Status: DC
Start: 2018-09-13 — End: 2018-09-10

## 2018-09-10 MED ORDER — SEVELAMER CARBONATE 800 MG PO TABS
2400.00 | ORAL_TABLET | ORAL | Status: DC
Start: 2018-09-14 — End: 2018-09-10

## 2018-09-10 NOTE — Progress Notes (Unsigned)
Nutrition Brief Note   Pt's ascorbic acid lab returned low today. Dietitian at [facility] notified; recommend supplementation with 500mg  IV BID x 5 days followed by 500mg  po BID x 30 days.  Koleen Distance MS, RD, LDN Pager #- [phone] Office#- [phone] After Hours Pager: [phone]

## 2018-09-14 ENCOUNTER — Other Ambulatory Visit: Payer: Self-pay

## 2018-09-14 ENCOUNTER — Encounter: Payer: Self-pay | Admitting: Emergency Medicine

## 2018-09-14 ENCOUNTER — Emergency Department
Admission: EM | Admit: 2018-09-14 | Discharge: 2018-09-14 | Disposition: A | Discharge status: Home / Self Care | Payer: Medicare PPO | Attending: Emergency Medicine | Admitting: Emergency Medicine

## 2018-09-14 DIAGNOSIS — Z87891 Personal history of nicotine dependence: Secondary | ICD-10-CM | POA: Diagnosis not present

## 2018-09-14 DIAGNOSIS — J45909 Unspecified asthma, uncomplicated: Secondary | ICD-10-CM | POA: Diagnosis not present

## 2018-09-14 DIAGNOSIS — I120 Hypertensive chronic kidney disease with stage 5 chronic kidney disease or end stage renal disease: Secondary | ICD-10-CM | POA: Insufficient documentation

## 2018-09-14 DIAGNOSIS — R338 Other retention of urine: Secondary | ICD-10-CM | POA: Diagnosis not present

## 2018-09-14 DIAGNOSIS — N186 End stage renal disease: Secondary | ICD-10-CM | POA: Insufficient documentation

## 2018-09-14 DIAGNOSIS — I2510 Atherosclerotic heart disease of native coronary artery without angina pectoris: Secondary | ICD-10-CM | POA: Diagnosis not present

## 2018-09-14 DIAGNOSIS — R109 Unspecified abdominal pain: Secondary | ICD-10-CM | POA: Diagnosis not present

## 2018-09-14 DIAGNOSIS — Z992 Dependence on renal dialysis: Secondary | ICD-10-CM | POA: Insufficient documentation

## 2018-09-14 LAB — URINALYSIS, COMPLETE (UACMP) WITH MICROSCOPIC
BILIRUBIN URINE: NEGATIVE
Bacteria, UA: NONE SEEN
GLUCOSE, UA: 150 mg/dL — AB
Ketones, ur: NEGATIVE mg/dL
LEUKOCYTES UA: NEGATIVE
Nitrite: NEGATIVE
PROTEIN: NEGATIVE mg/dL
Specific Gravity, Urine: 1.005 (ref 1.005–1.030)
Squamous Epithelial / LPF: NONE SEEN (ref 0–5)
pH: 9 — ABNORMAL HIGH (ref 5.0–8.0)

## 2018-09-14 MED ORDER — EPOETIN ALFA-EPBX 4000 UNIT/ML IJ SOLN
50.00 | INTRAMUSCULAR | Status: DC
Start: ? — End: 2018-09-14

## 2018-09-14 MED ORDER — PANTOPRAZOLE SODIUM 40 MG PO TBEC
40.00 | DELAYED_RELEASE_TABLET | ORAL | Status: DC
Start: 2018-09-14 — End: 2018-09-14

## 2018-09-14 MED ORDER — VITAMIN C 500 MG PO TABS
500.00 | ORAL_TABLET | ORAL | Status: DC
Start: 2018-09-14 — End: 2018-09-14

## 2018-09-14 NOTE — ED Notes (Signed)
Pt alert and oriented X4, active, cooperative, pt in NAD. RR even and unlabored, color WNL.  Pt informed to return if any life threatening symptoms occur.  Discharge and followup instructions reviewed. Ambulates safely. Instructed on catheter use and followup. Instructed on importance of daily cleaning.

## 2018-09-14 NOTE — ED Provider Notes (Signed)
[facility] Emergency Department Provider Note       Time seen: ----------------------------------------- 5:55 PM on 09/14/2018 -----------------------------------------   I have reviewed the triage vital signs and the nursing notes.  HISTORY   Chief Complaint Urinary Retention    HPI Jeremy Rose is a 62 y.o. male with a history of coronary artery disease, dialysis, GERD, hypertension, seizures who presents to the ED for worsening abdominal pain since this morning.  Patient is scheduled for dialysis treatment tomorrow.  He reports drinking water all day in hopes of making himself void he has not produced any urine.  On arrival here he had almost a liter in his bladder.  Past Medical History:  Diagnosis Date  . Arthritis    hands  . Asthma    uses inhaler  . Coronary artery disease   . Dialysis patient [facility]) 2012   Monday, Wednesday, Friday  . GERD (gastroesophageal reflux disease)   . Gout of right hand   . Hypertension   . Renal disorder    stage V..on dialysis  . Seizures ([place]) 2019   last seizure 5 years ago. had 20 in one day    Patient Active Problem List   Diagnosis Date Noted  . Anemia 09/01/2018  . Blood in stool   . Duodenitis   . Acute gastritis without hemorrhage   . GIB (gastrointestinal bleeding) 08/30/2018  . Arm pain 04/29/2018  . Complication of AV dialysis fistula 10/02/2017  . Atypical chest pain 10/25/2014  . Coronary artery disease 10/25/2014  . Hypertension 10/25/2014  . ESRD (end stage renal disease) on dialysis ([place]) 10/25/2014  . Seizures ([place]) 10/25/2014    Past Surgical History:  Procedure Laterality Date  . A/V FISTULAGRAM Left 02/24/2017   Procedure: A/V Fistulagram;  Surgeon: Katha Cabal, MD;  Location: [place] [facility];  Service: Cardiovascular;  Laterality: Left;  . A/V SHUNT INTERVENTION N/A 02/24/2017   Procedure: A/V Shunt Intervention;  Surgeon: Katha Cabal, MD;  Location: [place] [facility];  Service: Cardiovascular;  Laterality: N/A;  . AV FISTULA PLACEMENT Left   . AV FISTULA PLACEMENT Left 12/02/2017   Procedure: ARTERIOVENOUS (AV) FISTULA CREATION ( BRACHIAL CEPHALIC );  Surgeon: Katha Cabal, MD;  Location: [facility] ORS;  Service: Vascular;  Laterality: Left;  . CARDIAC CATHETERIZATION  2012   no stents  . COLONOSCOPY Left 09/05/2018   Procedure: COLONOSCOPY;  Surgeon: Virgel Manifold, MD;  Location: [facility] ENDOSCOPY;  Service: Endoscopy;  Laterality: Left;  . DIALYSIS/PERMA CATHETER REMOVAL N/A 08/24/2018   Procedure: DIALYSIS/PERMA CATHETER REMOVAL;  Surgeon: Katha Cabal, MD;  Location: [place] [facility];  Service: Cardiovascular;  Laterality: N/A;  . ESOPHAGOGASTRODUODENOSCOPY Left 09/05/2018   Procedure: ESOPHAGOGASTRODUODENOSCOPY (EGD);  Surgeon: Virgel Manifold, MD;  Location: [facility] ENDOSCOPY;  Service: Endoscopy;  Laterality: Left;  . ESOPHAGOGASTRODUODENOSCOPY (EGD) WITH PROPOFOL N/A 08/31/2018   Procedure: ESOPHAGOGASTRODUODENOSCOPY (EGD) WITH PROPOFOL;  Surgeon: Lucilla Lame, MD;  Location: [facility] ENDOSCOPY;  Service: Endoscopy;  Laterality: N/A;  . GIVENS CAPSULE STUDY N/A 09/07/2018   Procedure: GIVENS CAPSULE STUDY;  Surgeon: Jonathon Bellows, MD;  Location: [facility] ENDOSCOPY;  Service: Gastroenterology;  Laterality: N/A;  . INSERTION OF DIALYSIS CATHETER Left 05/28/2018   Procedure: INSERTION OF DIALYSIS CATHETER ( TUNNELED CATH INSERT );  Surgeon: Katha Cabal, MD;  Location: [facility] ORS;  Service: Vascular;  Laterality: Left;  . LIGATION OF ARTERIOVENOUS  FISTULA Left 12/02/2017   Procedure: LIGATION OF ARTERIOVENOUS  FISTULA (  REMOVAL LEFT WRIST FISTULA );  Surgeon: Katha Cabal, MD;  Location: [facility] ORS;  Service: Vascular;  Laterality: Left;  . PERIPHERAL VASCULAR CATHETERIZATION Left 06/03/2016   Procedure: A/V Shuntogram/Fistulagram;  Surgeon: Katha Cabal, MD;  Location: [place] [facility];  Service: Cardiovascular;   Laterality: Left;  . PERIPHERAL VASCULAR CATHETERIZATION  06/03/2016   Procedure: Peripheral Vascular Balloon Angioplasty;  Surgeon: Katha Cabal, MD;  Location: [place] [facility];  Service: Cardiovascular;;  . REVISON OF ARTERIOVENOUS FISTULA Left 05/28/2018   Procedure: REVISON OF ARTERIOVENOUS FISTULA ( REMOVE ANEURYSM );  Surgeon: Katha Cabal, MD;  Location: [facility] ORS;  Service: Vascular;  Laterality: Left;  . UPPER EXTREMITY VENOGRAPHY N/A 04/13/2018   Procedure: UPPER EXTREMITY VENOGRAPHY;  Surgeon: Katha Cabal, MD;  Location: [place] [facility];  Service: Cardiovascular;  Laterality: N/A;    Allergies Codeine and Doxycycline  Social History Social History   Tobacco Use  . Smoking status: Former Smoker    Types: Cigarettes  . Smokeless tobacco: Former Systems developer    Quit date: 2010  . Tobacco comment: did not inhale!!  Substance Use Topics  . Alcohol use: No  . Drug use: No   Review of Systems Constitutional: Negative for fever. Cardiovascular: Negative for chest pain. Respiratory: Negative for shortness of breath. Gastrointestinal: Negative for abdominal pain, vomiting and diarrhea. Genitourinary: Positive for oliguria Musculoskeletal: Negative for back pain. Skin: Negative for rash. Neurological: Negative for headaches, focal weakness or numbness.  All systems negative/normal/unremarkable except as stated in the HPI  ____________________________________________   PHYSICAL EXAM:  VITAL SIGNS: ED Triage Vitals  Enc Vitals Group     BP 09/14/18 1628 (!) 148/81     Pulse Rate 09/14/18 1628 96     Resp 09/14/18 1628 16     Temp 09/14/18 1628 98.3 F (36.8 C)     Temp Source 09/14/18 1628 Oral     SpO2 09/14/18 1628 98 %     Weight 09/14/18 1629 165 lb 5.5 oz (75 kg)     Height 09/14/18 1629 5\' 8"  (1.727 m)     Head Circumference --      Peak Flow --      Pain Score 09/14/18 1628 4     Pain Loc --      Pain Edu? --      Excl. in [place]? --     Constitutional: Alert and oriented.  Tonically ill-appearing, no distress Eyes: Conjunctivae are normal. Normal extraocular movements. Cardiovascular: Normal rate, regular rhythm. No murmurs, rubs, or gallops.  AV fistula in left arm Respiratory: Normal respiratory effort without tachypnea nor retractions. Breath sounds are clear and equal bilaterally. No wheezes/rales/rhonchi. Gastrointestinal: Soft and nontender. Normal bowel sounds Musculoskeletal: Nontender with normal range of motion in extremities. No lower extremity tenderness nor edema. Neurologic:  Normal speech and language. No gross focal neurologic deficits are appreciated.  Skin:  Skin is warm, dry and intact. No rash noted. Psychiatric: Mood and affect are normal. Speech and behavior are normal.  ____________________________________________  ED COURSE:  As part of my medical decision making, I reviewed the following data within the [place] History obtained from family if available, nursing notes, old chart and ekg, as well as notes from prior ED visits. Patient presented for urinary retention, Foley catheter was placed prior to my evaluation of the patient.   Procedures ____________________________________________   LABS (pertinent positives/negatives)  Labs Reviewed  URINALYSIS, COMPLETE (UACMP) WITH MICROSCOPIC -  Abnormal; Notable for the following components:      Result Value   Color, Urine COLORLESS (*)    APPearance CLEAR (*)    pH 9.0 (*)    Glucose, UA 150 (*)    Hgb urine dipstick SMALL (*)    All other components within normal limits   ___________________________________________  DIFFERENTIAL DIAGNOSIS   Acute urinary retention, BPH, UTI  FINAL ASSESSMENT AND PLAN  Acute urinary retention   Plan: The patient had presented for urinary retention.  He tolerated this well, we changed him to a leg bag.  Urine was not indicative of infection.   Laurence Aly,  MD   Note: This note was generated in part or whole with voice recognition software. Voice recognition is usually quite accurate but there are transcription errors that can and very often do occur. I apologize for any typographical errors that were not detected and corrected.     Earleen Newport, MD 09/14/18 Johnnye Lana

## 2018-09-14 NOTE — ED Triage Notes (Signed)
Patient reports that he has had very little urine out put x2 days. Reports worsening abdominal pain since this morning. Patient is dialysis patient and scheduled for treatment tomorrow. Reports he has been drinking water all day with hopes of making himself void but has not produced any urine. Denies swelling in legs and feet.

## 2018-09-14 NOTE — ED Notes (Signed)
FIRST NURSE NOTE:  Pt reports difficulty producing urine, pt is on dialysis, states he has not been able to urinate much lately. Pt declined wheelchair.

## 2018-09-14 NOTE — ED Notes (Signed)
Bladder scan performed showing 946ml of urine in bladder.

## 2018-09-17 ENCOUNTER — Ambulatory Visit (INDEPENDENT_AMBULATORY_CARE_PROVIDER_SITE_OTHER): Payer: Medicare PPO | Admitting: Urology

## 2018-09-17 ENCOUNTER — Encounter: Payer: Self-pay | Admitting: Urology

## 2018-09-17 VITALS — BP 142/75 | HR 76 | Ht 68.0 in | Wt 174.0 lb

## 2018-09-17 DIAGNOSIS — R339 Retention of urine, unspecified: Secondary | ICD-10-CM | POA: Diagnosis not present

## 2018-09-17 NOTE — Progress Notes (Signed)
09/17/2018 11:08 AM   Jeremy Rose 1956-03-22 [id number]  Referring provider: Freddy Finner, NP [place], [place] [address]  Chief Complaint  Patient presents with  . Urinary Retention    New Patient    HPI: 62 year old male who presents for follow-up of urinary retention.  He has been on hemodialysis for the past 4-5 years.  He was admitted to [facility] and October 2019 with GI bleeding.  He was seen by nephrology during that hospitalization and was started on tamsulosin for urinary hesitancy and urgency.  An in and out catheterization was performed with 1000 mL of urine obtained.  He underwent periodic intermittent catheterization during that hospitalization.  He was discharged on 10/23 and presented to the ED on 10/29 complaining of lower abdominal pain.  He was catheterized for approximately 1000 mL and his Foley catheter was left indwelling. He remains on tamsulosin.  PMH: Past Medical History:  Diagnosis Date  . Arthritis    hands  . Asthma    uses inhaler  . Coronary artery disease   . Dialysis patient [facility]) 2012   Monday, Wednesday, Friday  . GERD (gastroesophageal reflux disease)   . Gout of right hand   . Hypertension   . Renal disorder    stage V..on dialysis  . Seizures ([place]) 2019   last seizure 5 years ago. had 20 in one day    Surgical History: Past Surgical History:  Procedure Laterality Date  . A/V FISTULAGRAM Left 02/24/2017   Procedure: A/V Fistulagram;  Surgeon: Katha Cabal, MD;  Location: [place] [facility];  Service: Cardiovascular;  Laterality: Left;  . A/V SHUNT INTERVENTION N/A 02/24/2017   Procedure: A/V Shunt Intervention;  Surgeon: Katha Cabal, MD;  Location: [place] [facility];  Service: Cardiovascular;  Laterality: N/A;  . AV FISTULA PLACEMENT Left   . AV FISTULA PLACEMENT Left 12/02/2017   Procedure: ARTERIOVENOUS (AV) FISTULA CREATION ( BRACHIAL CEPHALIC );  Surgeon: Katha Cabal, MD;  Location:  [facility] ORS;  Service: Vascular;  Laterality: Left;  . CARDIAC CATHETERIZATION  2012   no stents  . COLONOSCOPY Left 09/05/2018   Procedure: COLONOSCOPY;  Surgeon: Virgel Manifold, MD;  Location: [facility] ENDOSCOPY;  Service: Endoscopy;  Laterality: Left;  . DIALYSIS/PERMA CATHETER REMOVAL N/A 08/24/2018   Procedure: DIALYSIS/PERMA CATHETER REMOVAL;  Surgeon: Katha Cabal, MD;  Location: [place] [facility];  Service: Cardiovascular;  Laterality: N/A;  . ESOPHAGOGASTRODUODENOSCOPY Left 09/05/2018   Procedure: ESOPHAGOGASTRODUODENOSCOPY (EGD);  Surgeon: Virgel Manifold, MD;  Location: [facility] ENDOSCOPY;  Service: Endoscopy;  Laterality: Left;  . ESOPHAGOGASTRODUODENOSCOPY (EGD) WITH PROPOFOL N/A 08/31/2018   Procedure: ESOPHAGOGASTRODUODENOSCOPY (EGD) WITH PROPOFOL;  Surgeon: Lucilla Lame, MD;  Location: [facility] ENDOSCOPY;  Service: Endoscopy;  Laterality: N/A;  . GIVENS CAPSULE STUDY N/A 09/07/2018   Procedure: GIVENS CAPSULE STUDY;  Surgeon: Jonathon Bellows, MD;  Location: [facility] ENDOSCOPY;  Service: Gastroenterology;  Laterality: N/A;  . INSERTION OF DIALYSIS CATHETER Left 05/28/2018   Procedure: INSERTION OF DIALYSIS CATHETER ( TUNNELED CATH INSERT );  Surgeon: Katha Cabal, MD;  Location: [facility] ORS;  Service: Vascular;  Laterality: Left;  . LIGATION OF ARTERIOVENOUS  FISTULA Left 12/02/2017   Procedure: LIGATION OF ARTERIOVENOUS  FISTULA ( REMOVAL LEFT WRIST FISTULA );  Surgeon: Katha Cabal, MD;  Location: [facility] ORS;  Service: Vascular;  Laterality: Left;  . PERIPHERAL VASCULAR CATHETERIZATION Left 06/03/2016   Procedure: A/V Shuntogram/Fistulagram;  Surgeon: Katha Cabal, MD;  Location:  [place] [facility];  Service: Cardiovascular;  Laterality: Left;  . PERIPHERAL VASCULAR CATHETERIZATION  06/03/2016   Procedure: Peripheral Vascular Balloon Angioplasty;  Surgeon: Katha Cabal, MD;  Location: [place] [facility];  Service: Cardiovascular;;  . REVISON OF ARTERIOVENOUS FISTULA  Left 05/28/2018   Procedure: REVISON OF ARTERIOVENOUS FISTULA ( REMOVE ANEURYSM );  Surgeon: Katha Cabal, MD;  Location: [facility] ORS;  Service: Vascular;  Laterality: Left;  . UPPER EXTREMITY VENOGRAPHY N/A 04/13/2018   Procedure: UPPER EXTREMITY VENOGRAPHY;  Surgeon: Katha Cabal, MD;  Location: [place] [facility];  Service: Cardiovascular;  Laterality: N/A;    Home Medications:  Allergies as of 09/17/2018      Reactions   Codeine Nausea And Vomiting   Doxycycline Nausea And Vomiting      Medication List        Accurate as of 09/17/18 11:08 AM. Always use your most recent med list.          acetaminophen 500 MG tablet Commonly known as:  TYLENOL Take 1 tablet (500 mg total) by mouth every 6 (six) hours as needed for mild pain.   albuterol 108 (90 Base) MCG/ACT inhaler Commonly known as:  PROVENTIL HFA;VENTOLIN HFA Inhale into the lungs every 6 (six) hours as needed for wheezing or shortness of breath.   allopurinol 100 MG tablet Commonly known as:  ZYLOPRIM Take 200 mg by mouth daily.   beclomethasone 80 MCG/ACT inhaler Commonly known as:  QVAR Inhale 1 puff into the lungs as needed (for shortness of breath).   cinacalcet 60 MG tablet Commonly known as:  SENSIPAR Take 120 mg by mouth daily.   cyanocobalamin 1000 MCG tablet Take 1,000 mcg daily by mouth.   levETIRAcetam 500 MG tablet Commonly known as:  KEPPRA Take 500 mg by mouth 2 (two) times daily. Take a 3rd dose of 500 mgs after dialysis only on dialysis days (M-W-F)   oxyCODONE-acetaminophen 5-325 MG tablet Commonly known as:  PERCOCET/ROXICET Take 1 tablet by mouth every 4 (four) hours as needed for severe pain.   pantoprazole 40 MG tablet Commonly known as:  PROTONIX Take 1 tablet (40 mg total) by mouth 2 (two) times daily.   sevelamer carbonate 800 MG tablet Commonly known as:  RENVELA Take 2,400 mg by mouth 3 (three) times daily with meals.   tamsulosin 0.4 MG Caps capsule Commonly known  as:  FLOMAX Take 1 capsule (0.4 mg total) by mouth daily after supper.   VIMPAT 150 MG Tabs Generic drug:  Lacosamide Take 150 mg by mouth 2 (two) times daily. Take an 3rd dose after dialysis on M-W-F       Allergies:  Allergies  Allergen Reactions  . Codeine Nausea And Vomiting  . Doxycycline Nausea And Vomiting    Family History: Family History  Problem Relation Age of Onset  . Heart disease Mother   . Kidney disease Paternal Uncle     Social History:  reports that he has quit smoking. His smoking use included cigarettes. He quit smokeless tobacco use about 9 years ago. He reports that he does not drink alcohol or use drugs.  ROS: UROLOGY Frequent Urination?: No Hard to postpone urination?: No Burning/pain with urination?: No Get up at night to urinate?: No Leakage of urine?: No Urine stream starts and stops?: Yes Trouble starting stream?: No Do you have to strain to urinate?: Yes Blood in urine?: No Urinary tract infection?: No Sexually transmitted disease?: No Injury to kidneys or bladder?:  No Painful intercourse?: No Weak stream?: No Erection problems?: No Penile pain?: No  Gastrointestinal Nausea?: No Vomiting?: No Indigestion/heartburn?: Yes Diarrhea?: No Constipation?: No  Constitutional Fever: No Night sweats?: No Weight loss?: Yes Fatigue?: No  Skin Skin rash/lesions?: No Itching?: No  Eyes Blurred vision?: No Double vision?: No  Ears/Nose/Throat Sore throat?: No Sinus problems?: No  Hematologic/Lymphatic Swollen glands?: No Easy bruising?: No  Cardiovascular Leg swelling?: No Chest pain?: No  Respiratory Cough?: No Shortness of breath?: No  Endocrine Excessive thirst?: No  Musculoskeletal Back pain?: No Joint pain?: No  Neurological Headaches?: No Dizziness?: No  Psychologic Depression?: No Anxiety?: No  Physical Exam: BP (!) 142/75   Pulse 76   Ht 5\' 8"  (1.727 m)   Wt 174 lb (78.9 kg)   BMI 26.46 kg/m     Constitutional:  Alert and oriented, No acute distress. HEENT: [place] AT, moist mucus membranes.  Trachea midline, no masses. Cardiovascular: No clubbing, cyanosis, or edema. Respiratory: Normal respiratory effort, no increased work of breathing. GI: Abdomen is soft, nontender, nondistended, no abdominal masses GU: No CVA tenderness.  Foley catheter in place draining light pink urine.  Prostate 35 g, smooth without nodules Lymph: No cervical or inguinal lymphadenopathy. Skin: No rashes, bruises or suspicious lesions. Neurologic: Grossly intact, no focal deficits, moving all 4 extremities. Psychiatric: Normal mood and affect.   Assessment & Plan:   62 year old male with significant urinary retention.  This may be chronic.  I recommended an indwelling Foley catheter for 7-10 days.  Continue tamsulosin.  Follow-up for catheter removal/voiding trial.   Abbie Sons, MD  [facility] [address], [place], [place] [address] [phone]

## 2018-09-18 DIAGNOSIS — E8770 Fluid overload, unspecified: Secondary | ICD-10-CM | POA: Insufficient documentation

## 2018-09-20 ENCOUNTER — Telehealth: Payer: Self-pay | Admitting: Urology

## 2018-09-20 MED ORDER — TAMSULOSIN HCL 0.4 MG PO CAPS: 0.4000 mg | ORAL_CAPSULE | Freq: Every day | ORAL | 0 refills | Status: DC

## 2018-09-20 NOTE — Addendum Note (Signed)
Addended by: John Giovanni C on: 09/20/2018 02:05 PM   Modules accepted: Orders

## 2018-09-20 NOTE — Telephone Encounter (Signed)
Paulette calling on the behalf of pt, stating that the ER never sent in the Rx for Tamsulosin, would like to know if [facility] would send this Rx in for pt since [facility] stated that the pt should continue flomax at his last appt. Pt has nurse visit tomorrow however, they would like to get Rx as soon as they can. Please advise Paulette at [phone]

## 2018-09-20 NOTE — Telephone Encounter (Signed)
Rx was sent  

## 2018-09-21 ENCOUNTER — Ambulatory Visit: Payer: Medicare PPO

## 2018-09-22 ENCOUNTER — Telehealth: Payer: Self-pay

## 2018-09-22 NOTE — Telephone Encounter (Signed)
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

## 2018-09-22 NOTE — Telephone Encounter (Signed)
Called pt to inform him of lab results and Dr. Georgeann Oppenheim suggestion for pt to begin taking Vitamin C 300mg  daily for 4 weeks and his instructions for pt to repeat labs after. Unable to speak with pt but was able to leave a message with pt friend, Jeanne Ivan, to have pt contact our office.

## 2018-09-23 ENCOUNTER — Ambulatory Visit: Payer: Medicare PPO | Admitting: Gastroenterology

## 2018-09-23 NOTE — Telephone Encounter (Signed)
Jeremy Rose returned call and results of labs were given. She will get Vitamin C to start taking. Pt has a scheduled follow up with our office on 10/21/18.

## 2018-09-24 ENCOUNTER — Emergency Department
Admission: EM | Admit: 2018-09-24 | Discharge: 2018-09-24 | Disposition: A | Discharge status: Home / Self Care | Payer: Medicare PPO | Attending: Emergency Medicine | Admitting: Emergency Medicine

## 2018-09-24 ENCOUNTER — Other Ambulatory Visit: Payer: Self-pay

## 2018-09-24 ENCOUNTER — Encounter: Payer: Self-pay | Admitting: Emergency Medicine

## 2018-09-24 DIAGNOSIS — I2510 Atherosclerotic heart disease of native coronary artery without angina pectoris: Secondary | ICD-10-CM | POA: Insufficient documentation

## 2018-09-24 DIAGNOSIS — N186 End stage renal disease: Secondary | ICD-10-CM | POA: Diagnosis not present

## 2018-09-24 DIAGNOSIS — I120 Hypertensive chronic kidney disease with stage 5 chronic kidney disease or end stage renal disease: Secondary | ICD-10-CM | POA: Insufficient documentation

## 2018-09-24 DIAGNOSIS — Z452 Encounter for adjustment and management of vascular access device: Secondary | ICD-10-CM | POA: Diagnosis present

## 2018-09-24 DIAGNOSIS — J45909 Unspecified asthma, uncomplicated: Secondary | ICD-10-CM | POA: Insufficient documentation

## 2018-09-24 DIAGNOSIS — Y69 Unspecified misadventure during surgical and medical care: Secondary | ICD-10-CM | POA: Diagnosis not present

## 2018-09-24 DIAGNOSIS — T82898A Other specified complication of vascular prosthetic devices, implants and grafts, initial encounter: Secondary | ICD-10-CM | POA: Insufficient documentation

## 2018-09-24 DIAGNOSIS — Z87891 Personal history of nicotine dependence: Secondary | ICD-10-CM | POA: Diagnosis not present

## 2018-09-24 DIAGNOSIS — Z992 Dependence on renal dialysis: Secondary | ICD-10-CM | POA: Diagnosis not present

## 2018-09-24 LAB — COMPREHENSIVE METABOLIC PANEL
ALK PHOS: 81 U/L (ref 38–126)
ALT: 11 U/L (ref 0–44)
AST: 17 U/L (ref 15–41)
Albumin: 3.5 g/dL (ref 3.5–5.0)
Anion gap: 16 — ABNORMAL HIGH (ref 5–15)
BUN: 49 mg/dL — ABNORMAL HIGH (ref 8–23)
CALCIUM: 8.9 mg/dL (ref 8.9–10.3)
CO2: 30 mmol/L (ref 22–32)
Chloride: 97 mmol/L — ABNORMAL LOW (ref 98–111)
Creatinine, Ser: 11.37 mg/dL — ABNORMAL HIGH (ref 0.61–1.24)
GFR calc non Af Amer: 4 mL/min — ABNORMAL LOW (ref >60)
GFR, EST AFRICAN AMERICAN: 5 mL/min — AB (ref >60)
Glucose, Bld: 61 mg/dL — ABNORMAL LOW (ref 70–99)
Potassium: 5.1 mmol/L (ref 3.5–5.1)
SODIUM: 143 mmol/L (ref 135–145)
Total Bilirubin: 0.7 mg/dL (ref 0.3–1.2)
Total Protein: 6.9 g/dL (ref 6.5–8.1)

## 2018-09-24 LAB — CBC WITH DIFFERENTIAL/PLATELET
Abs Immature Granulocytes: 0.02 10*3/uL (ref 0.00–0.07)
BASOS PCT: 1 %
Basophils Absolute: 0.1 10*3/uL (ref 0.0–0.1)
EOS PCT: 3 %
Eosinophils Absolute: 0.2 10*3/uL (ref 0.0–0.5)
HCT: 29.8 % — ABNORMAL LOW (ref 39.0–52.0)
HEMOGLOBIN: 9.6 g/dL — AB (ref 13.0–17.0)
Immature Granulocytes: 0 %
Lymphocytes Relative: 19 %
Lymphs Abs: 1.2 10*3/uL (ref 0.7–4.0)
MCH: 31.3 pg (ref 26.0–34.0)
MCHC: 32.2 g/dL (ref 30.0–36.0)
MCV: 97.1 fL (ref 80.0–100.0)
MONO ABS: 0.8 10*3/uL (ref 0.1–1.0)
Monocytes Relative: 12 %
Neutro Abs: 4.1 10*3/uL (ref 1.7–7.7)
Neutrophils Relative %: 65 %
PLATELETS: 242 10*3/uL (ref 150–400)
RBC: 3.07 MIL/uL — AB (ref 4.22–5.81)
RDW: 15 % (ref 11.5–15.5)
WBC: 6.4 10*3/uL (ref 4.0–10.5)
nRBC: 0 % (ref 0.0–0.2)

## 2018-09-24 MED ORDER — SODIUM ZIRCONIUM CYCLOSILICATE 10 G PO PACK
10.0000 g | PACK | Freq: Once | ORAL | Status: AC
  Administered 2018-09-24: 10 g via ORAL
  Filled 2018-09-24: qty 1

## 2018-09-24 MED ORDER — SODIUM ZIRCONIUM CYCLOSILICATE 10 G PO PACK: 10.0000 g | PACK | Freq: Every day | ORAL | 0 refills | Status: AC

## 2018-09-24 NOTE — ED Provider Notes (Signed)
[facility] Emergency Department Provider Note       Time seen: ----------------------------------------- 4:18 PM on 09/24/2018 -----------------------------------------   I have reviewed the triage vital signs and the nursing notes.  HISTORY   Chief Complaint Vascular Access Problem    HPI Jeremy Rose is a 62 y.o. male with a history of end-stage renal disease on dialysis, GERD, hypertension, seizures who presents to the ED for vascular access problem.  Patient states he went to dialysis today and they were unable to access his left AV fistula because it was clogged.  His last dialysis was on Wednesday.  He denies feeling ill, denies recent illness or other complaints.  He has had some left arm pain and swelling.  Past Medical History:  Diagnosis Date  . Arthritis    hands  . Asthma    uses inhaler  . Coronary artery disease   . Dialysis patient [facility]) 2012   Monday, Wednesday, Friday  . GERD (gastroesophageal reflux disease)   . Gout of right hand   . Hypertension   . Renal disorder    stage V..on dialysis  . Seizures ([place]) 2019   last seizure 5 years ago. had 20 in one day    Patient Active Problem List   Diagnosis Date Noted  . Urinary retention 09/17/2018  . Anemia 09/01/2018  . Blood in stool   . Duodenitis   . Acute gastritis without hemorrhage   . GIB (gastrointestinal bleeding) 08/30/2018  . Arm pain 04/29/2018  . Complication of AV dialysis fistula 10/02/2017  . Atypical chest pain 10/25/2014  . Coronary artery disease 10/25/2014  . Hypertension 10/25/2014  . ESRD (end stage renal disease) on dialysis ([place]) 10/25/2014  . Seizures ([place]) 10/25/2014    Past Surgical History:  Procedure Laterality Date  . A/V FISTULAGRAM Left 02/24/2017   Procedure: A/V Fistulagram;  Surgeon: Katha Cabal, MD;  Location: [place] [facility];  Service: Cardiovascular;  Laterality: Left;  . A/V SHUNT INTERVENTION N/A 02/24/2017   Procedure: A/V Shunt Intervention;  Surgeon: Katha Cabal, MD;  Location: [place] [facility];  Service: Cardiovascular;  Laterality: N/A;  . AV FISTULA PLACEMENT Left   . AV FISTULA PLACEMENT Left 12/02/2017   Procedure: ARTERIOVENOUS (AV) FISTULA CREATION ( BRACHIAL CEPHALIC );  Surgeon: Katha Cabal, MD;  Location: [facility] ORS;  Service: Vascular;  Laterality: Left;  . CARDIAC CATHETERIZATION  2012   no stents  . COLONOSCOPY Left 09/05/2018   Procedure: COLONOSCOPY;  Surgeon: Virgel Manifold, MD;  Location: [facility] ENDOSCOPY;  Service: Endoscopy;  Laterality: Left;  . DIALYSIS/PERMA CATHETER REMOVAL N/A 08/24/2018   Procedure: DIALYSIS/PERMA CATHETER REMOVAL;  Surgeon: Katha Cabal, MD;  Location: [place] [facility];  Service: Cardiovascular;  Laterality: N/A;  . ESOPHAGOGASTRODUODENOSCOPY Left 09/05/2018   Procedure: ESOPHAGOGASTRODUODENOSCOPY (EGD);  Surgeon: Virgel Manifold, MD;  Location: [facility] ENDOSCOPY;  Service: Endoscopy;  Laterality: Left;  . ESOPHAGOGASTRODUODENOSCOPY (EGD) WITH PROPOFOL N/A 08/31/2018   Procedure: ESOPHAGOGASTRODUODENOSCOPY (EGD) WITH PROPOFOL;  Surgeon: Lucilla Lame, MD;  Location: [facility] ENDOSCOPY;  Service: Endoscopy;  Laterality: N/A;  . GIVENS CAPSULE STUDY N/A 09/07/2018   Procedure: GIVENS CAPSULE STUDY;  Surgeon: Jonathon Bellows, MD;  Location: [facility] ENDOSCOPY;  Service: Gastroenterology;  Laterality: N/A;  . INSERTION OF DIALYSIS CATHETER Left 05/28/2018   Procedure: INSERTION OF DIALYSIS CATHETER ( TUNNELED CATH INSERT );  Surgeon: Katha Cabal, MD;  Location: [facility] ORS;  Service: Vascular;  Laterality: Left;  . LIGATION  OF ARTERIOVENOUS  FISTULA Left 12/02/2017   Procedure: LIGATION OF ARTERIOVENOUS  FISTULA ( REMOVAL LEFT WRIST FISTULA );  Surgeon: Katha Cabal, MD;  Location: [facility] ORS;  Service: Vascular;  Laterality: Left;  . PERIPHERAL VASCULAR CATHETERIZATION Left 06/03/2016   Procedure: A/V Shuntogram/Fistulagram;  Surgeon:  Katha Cabal, MD;  Location: [place] [facility];  Service: Cardiovascular;  Laterality: Left;  . PERIPHERAL VASCULAR CATHETERIZATION  06/03/2016   Procedure: Peripheral Vascular Balloon Angioplasty;  Surgeon: Katha Cabal, MD;  Location: [place] [facility];  Service: Cardiovascular;;  . REVISON OF ARTERIOVENOUS FISTULA Left 05/28/2018   Procedure: REVISON OF ARTERIOVENOUS FISTULA ( REMOVE ANEURYSM );  Surgeon: Katha Cabal, MD;  Location: [facility] ORS;  Service: Vascular;  Laterality: Left;  . UPPER EXTREMITY VENOGRAPHY N/A 04/13/2018   Procedure: UPPER EXTREMITY VENOGRAPHY;  Surgeon: Katha Cabal, MD;  Location: [place] [facility];  Service: Cardiovascular;  Laterality: N/A;    Allergies Codeine and Doxycycline  Social History Social History   Tobacco Use  . Smoking status: Former Smoker    Types: Cigarettes  . Smokeless tobacco: Former Systems developer    Quit date: 2010  . Tobacco comment: did not inhale!!  Substance Use Topics  . Alcohol use: No  . Drug use: No   Review of Systems Constitutional: Negative for fever. Cardiovascular: Negative for chest pain. Respiratory: Negative for shortness of breath. Gastrointestinal: Negative for abdominal pain, vomiting and diarrhea. Musculoskeletal: Positive left arm pain and swelling Skin: Negative for rash. Neurological: Negative for headaches, focal weakness or numbness.  All systems negative/normal/unremarkable except as stated in the HPI  ____________________________________________   PHYSICAL EXAM:  VITAL SIGNS: ED Triage Vitals  Enc Vitals Group     BP 09/24/18 1430 (!) 156/100     Pulse Rate 09/24/18 1430 67     Resp 09/24/18 1430 18     Temp 09/24/18 1430 98 F (36.7 C)     Temp Source 09/24/18 1430 Oral     SpO2 09/24/18 1430 100 %     Weight 09/24/18 1428 164 lb (74.4 kg)     Height 09/24/18 1428 5\' 8"  (1.727 m)     Head Circumference --      Peak Flow --      Pain Score 09/24/18 1428 0     Pain Loc  --      Pain Edu? --      Excl. in [place]? --    Constitutional: Alert and oriented. Well appearing and in no distress. Eyes: Conjunctivae are normal. Normal extraocular movements. ENT   Head: Normocephalic and atraumatic.   Nose: No congestion/rhinnorhea.   Mouth/Throat: Mucous membranes are moist.   Neck: No stridor. Cardiovascular: Normal rate, regular rhythm. No murmurs, rubs, or gallops.  Left AV fistula without thrill or bruit, no focal tenderness Respiratory: Normal respiratory effort without tachypnea nor retractions. Breath sounds are clear and equal bilaterally. No wheezes/rales/rhonchi. Gastrointestinal: Soft and nontender. Normal bowel sounds Musculoskeletal: Abnormal left AV fistula with no thrill or bruit Neurologic:  Normal speech and language. No gross focal neurologic deficits are appreciated.  Skin:  Skin is warm, dry and intact. No rash noted. Psychiatric: Mood and affect are normal. Speech and behavior are normal.  ____________________________________________  EKG: Interpreted by me.  Sinus rhythm rate is 63 bpm, normal PR interval, normal QRS, normal QT  ____________________________________________  ED COURSE:  As part of my medical decision making, I reviewed the following data within the electronic medical  record:  History obtained from family if available, nursing notes, old chart and ekg, as well as notes from prior ED visits. Patient presented for AV fistula dysfunction, we will assess with labs and imaging as indicated at this time.   Procedures ____________________________________________   LABS (pertinent positives/negatives)  Labs Reviewed  CBC WITH DIFFERENTIAL/PLATELET - Abnormal; Notable for the following components:      Result Value   RBC 3.07 (*)    Hemoglobin 9.6 (*)    HCT 29.8 (*)    All other components within normal limits  COMPREHENSIVE METABOLIC PANEL - Abnormal; Notable for the following components:   Chloride 97 (*)     Glucose, Bld 61 (*)    BUN 49 (*)    Creatinine, Ser 11.37 (*)    GFR calc non Af Amer 4 (*)    GFR calc Af Amer 5 (*)    Anion gap 16 (*)    All other components within normal limits   ____________________________________________  DIFFERENTIAL DIAGNOSIS   AV fistula clot, aneurysm, electrolyte abnormality, hyperkalemia  FINAL ASSESSMENT AND PLAN  AV fistula dysfunction   Plan: The patient had presented for clotted left AV fistula. Patient's labs not reveal any acute process, he has known chronic kidney disease.  I discussed with nephrology on-call who recommended Lokelma daily until he can follow-up to have his fistula declotted on Monday.   Laurence Aly, MD   Note: This note was generated in part or whole with voice recognition software. Voice recognition is usually quite accurate but there are transcription errors that can and very often do occur. I apologize for any typographical errors that were not detected and corrected.     Earleen Newport, MD 09/24/18 1655

## 2018-09-24 NOTE — ED Notes (Signed)
Pharmacy to send medication.

## 2018-09-24 NOTE — ED Triage Notes (Signed)
Pt to ED via POV from [facility] for Vascular access problem. Pt states that he went this morning to have his dialysis and he was not able to do it because his access is clogged. Pt last had dialysis on Wednesday. Pt is in NAD at this time.

## 2018-09-25 ENCOUNTER — Other Ambulatory Visit: Payer: Self-pay

## 2018-09-25 ENCOUNTER — Emergency Department
Admission: EM | Admit: 2018-09-25 | Discharge: 2018-09-25 | Disposition: A | Discharge status: Home / Self Care | Payer: Medicare PPO | Attending: Emergency Medicine | Admitting: Emergency Medicine

## 2018-09-25 DIAGNOSIS — Z79899 Other long term (current) drug therapy: Secondary | ICD-10-CM | POA: Diagnosis not present

## 2018-09-25 DIAGNOSIS — I2510 Atherosclerotic heart disease of native coronary artery without angina pectoris: Secondary | ICD-10-CM | POA: Insufficient documentation

## 2018-09-25 DIAGNOSIS — N185 Chronic kidney disease, stage 5: Secondary | ICD-10-CM | POA: Diagnosis not present

## 2018-09-25 DIAGNOSIS — I120 Hypertensive chronic kidney disease with stage 5 chronic kidney disease or end stage renal disease: Secondary | ICD-10-CM | POA: Diagnosis not present

## 2018-09-25 DIAGNOSIS — Z992 Dependence on renal dialysis: Secondary | ICD-10-CM | POA: Diagnosis not present

## 2018-09-25 DIAGNOSIS — Y829 Unspecified medical devices associated with adverse incidents: Secondary | ICD-10-CM | POA: Diagnosis not present

## 2018-09-25 DIAGNOSIS — T8249XA Other complication of vascular dialysis catheter, initial encounter: Secondary | ICD-10-CM | POA: Insufficient documentation

## 2018-09-25 DIAGNOSIS — J45909 Unspecified asthma, uncomplicated: Secondary | ICD-10-CM | POA: Insufficient documentation

## 2018-09-25 DIAGNOSIS — Z87891 Personal history of nicotine dependence: Secondary | ICD-10-CM | POA: Insufficient documentation

## 2018-09-25 DIAGNOSIS — T82898D Other specified complication of vascular prosthetic devices, implants and grafts, subsequent encounter: Secondary | ICD-10-CM

## 2018-09-25 MED ORDER — PATIROMER SORBITEX CALCIUM 8.4 G PO PACK
8.4000 g | PACK | Freq: Once | ORAL | Status: AC
  Administered 2018-09-25: 8.4 g via ORAL
  Filled 2018-09-25: qty 1

## 2018-09-25 NOTE — ED Notes (Signed)
Pt brought back to exam room via wheelchair, states that he started hurting in his head and just not feeling well, pt was seen this week and has a follow up appointment for Monday regarding his dialysis catheter, pt is a Monday, Wednesday, Friday dialysis patient and due to his fistula clotting off he has only been able to get treatment on Wednesday. Pt was also prescribed a medication on the 8th to prevent his potassium from getting too high, but the local pharmacies have been unable to fill it due to not having any instock.

## 2018-09-25 NOTE — ED Notes (Signed)
Spoke to Dr. Corky Downs about pt presentation, he stated no labs until MD sees pt. Informed pt and pt verbalized understanding.

## 2018-09-25 NOTE — ED Provider Notes (Signed)
[facility] Emergency Department Provider Note   ____________________________________________    I have reviewed the triage vital signs and the nursing notes.   HISTORY  Chief Complaint Fistula problem    HPI Jeremy Rose is a 62 y.o. male who presents with complaints of fistula problem.  Patient reports he was seen here yesterday and was found to have a clotted fistula, given a prescription for his potassium levels but was unable to get it filled.  He feels well and has no complaints.  No palpitations no shortness of breath.  No nausea or vomiting.   Past Medical History:  Diagnosis Date  . Arthritis    hands  . Asthma    uses inhaler  . Coronary artery disease   . Dialysis patient [facility]) 2012   Monday, Wednesday, Friday  . GERD (gastroesophageal reflux disease)   . Gout of right hand   . Hypertension   . Renal disorder    stage V..on dialysis  . Seizures ([place]) 2019   last seizure 5 years ago. had 20 in one day    Patient Active Problem List   Diagnosis Date Noted  . Urinary retention 09/17/2018  . Anemia 09/01/2018  . Blood in stool   . Duodenitis   . Acute gastritis without hemorrhage   . GIB (gastrointestinal bleeding) 08/30/2018  . Arm pain 04/29/2018  . Complication of AV dialysis fistula 10/02/2017  . Atypical chest pain 10/25/2014  . Coronary artery disease 10/25/2014  . Hypertension 10/25/2014  . ESRD (end stage renal disease) on dialysis ([place]) 10/25/2014  . Seizures ([place]) 10/25/2014    Past Surgical History:  Procedure Laterality Date  . A/V FISTULAGRAM Left 02/24/2017   Procedure: A/V Fistulagram;  Surgeon: Katha Cabal, MD;  Location: [place] CV LAB;  Service: Cardiovascular;  Laterality: Left;  . A/V SHUNT INTERVENTION N/A 02/24/2017   Procedure: A/V Shunt Intervention;  Surgeon: Katha Cabal, MD;  Location: [place] CV LAB;  Service: Cardiovascular;  Laterality: N/A;  . AV FISTULA PLACEMENT  Left   . AV FISTULA PLACEMENT Left 12/02/2017   Procedure: ARTERIOVENOUS (AV) FISTULA CREATION ( BRACHIAL CEPHALIC );  Surgeon: Katha Cabal, MD;  Location: [facility] ORS;  Service: Vascular;  Laterality: Left;  . CARDIAC CATHETERIZATION  2012   no stents  . COLONOSCOPY Left 09/05/2018   Procedure: COLONOSCOPY;  Surgeon: Virgel Manifold, MD;  Location: [facility] ENDOSCOPY;  Service: Endoscopy;  Laterality: Left;  . DIALYSIS/PERMA CATHETER REMOVAL N/A 08/24/2018   Procedure: DIALYSIS/PERMA CATHETER REMOVAL;  Surgeon: Katha Cabal, MD;  Location: [place] CV LAB;  Service: Cardiovascular;  Laterality: N/A;  . ESOPHAGOGASTRODUODENOSCOPY Left 09/05/2018   Procedure: ESOPHAGOGASTRODUODENOSCOPY (EGD);  Surgeon: Virgel Manifold, MD;  Location: [facility] ENDOSCOPY;  Service: Endoscopy;  Laterality: Left;  . ESOPHAGOGASTRODUODENOSCOPY (EGD) WITH PROPOFOL N/A 08/31/2018   Procedure: ESOPHAGOGASTRODUODENOSCOPY (EGD) WITH PROPOFOL;  Surgeon: Lucilla Lame, MD;  Location: [facility] ENDOSCOPY;  Service: Endoscopy;  Laterality: N/A;  . GIVENS CAPSULE STUDY N/A 09/07/2018   Procedure: GIVENS CAPSULE STUDY;  Surgeon: Jonathon Bellows, MD;  Location: [facility] ENDOSCOPY;  Service: Gastroenterology;  Laterality: N/A;  . INSERTION OF DIALYSIS CATHETER Left 05/28/2018   Procedure: INSERTION OF DIALYSIS CATHETER ( TUNNELED CATH INSERT );  Surgeon: Katha Cabal, MD;  Location: [facility] ORS;  Service: Vascular;  Laterality: Left;  . LIGATION OF ARTERIOVENOUS  FISTULA Left 12/02/2017   Procedure: LIGATION OF ARTERIOVENOUS  FISTULA ( REMOVAL LEFT WRIST FISTULA );  Surgeon: Katha Cabal, MD;  Location: [facility] ORS;  Service: Vascular;  Laterality: Left;  . PERIPHERAL VASCULAR CATHETERIZATION Left 06/03/2016   Procedure: A/V Shuntogram/Fistulagram;  Surgeon: Katha Cabal, MD;  Location: [place] CV LAB;  Service: Cardiovascular;  Laterality: Left;  . PERIPHERAL VASCULAR CATHETERIZATION  06/03/2016   Procedure:  Peripheral Vascular Balloon Angioplasty;  Surgeon: Katha Cabal, MD;  Location: [place] CV LAB;  Service: Cardiovascular;;  . REVISON OF ARTERIOVENOUS FISTULA Left 05/28/2018   Procedure: REVISON OF ARTERIOVENOUS FISTULA ( REMOVE ANEURYSM );  Surgeon: Katha Cabal, MD;  Location: [facility] ORS;  Service: Vascular;  Laterality: Left;  . UPPER EXTREMITY VENOGRAPHY N/A 04/13/2018   Procedure: UPPER EXTREMITY VENOGRAPHY;  Surgeon: Katha Cabal, MD;  Location: [place] CV LAB;  Service: Cardiovascular;  Laterality: N/A;    Prior to Admission medications   Medication Sig Start Date End Date Taking? Authorizing Provider  acetaminophen (TYLENOL) 500 MG tablet Take 1 tablet (500 mg total) by mouth every 6 (six) hours as needed for mild pain. 08/31/18   Salary, Avel Peace, MD  albuterol (PROVENTIL HFA;VENTOLIN HFA) 108 (90 BASE) MCG/ACT inhaler Inhale into the lungs every 6 (six) hours as needed for wheezing or shortness of breath.    [provider]  allopurinol (ZYLOPRIM) 100 MG tablet Take 200 mg by mouth daily.    [provider]  beclomethasone (QVAR) 80 MCG/ACT inhaler Inhale 1 puff into the lungs as needed (for shortness of breath).    [provider]  cinacalcet (SENSIPAR) 60 MG tablet Take 120 mg by mouth daily.     [provider]  cyanocobalamin 1000 MCG tablet Take 1,000 mcg daily by mouth.    [provider]  Lacosamide (VIMPAT) 150 MG TABS Take 150 mg by mouth 2 (two) times daily. Take an 3rd dose after dialysis on M-W-F    [provider]  levETIRAcetam (KEPPRA) 500 MG tablet Take 500 mg by mouth 2 (two) times daily. Take a 3rd dose of 500 mgs after dialysis only on dialysis days (M-W-F)    [provider]  oxyCODONE-acetaminophen (PERCOCET) 5-325 MG tablet Take 1 tablet by mouth every 4 (four) hours as needed for severe pain. 05/28/18 05/28/19  Schnier, Dolores Lory, MD  pantoprazole (PROTONIX) 40 MG tablet Take 1  tablet (40 mg total) by mouth 2 (two) times daily. 08/31/18   Salary, Avel Peace, MD  sevelamer carbonate (RENVELA) 800 MG tablet Take 2,400 mg by mouth 3 (three) times daily with meals.     [provider]  sodium zirconium cyclosilicate (LOKELMA) 10 g PACK packet Take 10 g by mouth daily for 4 days. 09/24/18 09/28/18  Earleen Newport, MD  tamsulosin (FLOMAX) 0.4 MG CAPS capsule Take 1 capsule (0.4 mg total) by mouth daily. 09/20/18   Stoioff, Ronda Fairly, MD     Allergies Codeine and Doxycycline  Family History  Problem Relation Age of Onset  . Heart disease Mother   . Kidney disease Paternal Uncle     Social History Social History   Tobacco Use  . Smoking status: Former Smoker    Types: Cigarettes  . Smokeless tobacco: Former Systems developer    Quit date: 2010  . Tobacco comment: did not inhale!!  Substance Use Topics  . Alcohol use: No  . Drug use: No    Review of Systems  Constitutional: No fever/chills   Cardiovascular: Denies chest pain. Respiratory: Denies shortness of breath. Gastrointestinal: No abdominal pain.  No nausea, no vomiting.      Neurological: Negative for headaches   ____________________________________________   PHYSICAL EXAM:  VITAL SIGNS: ED Triage Vitals  Enc Vitals Group     BP 09/25/18 1711 (!) 186/117     Pulse Rate 09/25/18 1711 83     Resp 09/25/18 1711 16     Temp 09/25/18 1711 98.7 F (37.1 C)     Temp Source 09/25/18 1711 Oral     SpO2 09/25/18 1711 99 %     Weight 09/25/18 1712 74 kg (163 lb 2.3 oz)     Height 09/25/18 1712 1.727 m (5\' 8" )     Head Circumference --      Peak Flow --      Pain Score 09/25/18 1712 0     Pain Loc --      Pain Edu? --      Excl. in [place]? --     Constitutional: Alert and oriented.  Eyes: Conjunctivae are normal.   Nose: No congestion/rhinnorhea. Mouth/Throat: Mucous membranes are moist.    Cardiovascular: Normal rate, regular rhythm. Grossly normal heart sounds.  Good peripheral  circulation.  Gastrointestinal: Soft and nontender. No distention.    Musculoskeletal: Fistula with minimal thrill.  Warm and well perfused distal extremity Neurologic:  Normal speech and language. No gross focal neurologic deficits are appreciated.  Skin:  Skin is warm, dry and intact. No rash noted. Psychiatric: Mood and affect are normal. Speech and behavior are normal.  ____________________________________________   LABS (all labs ordered are listed, but only abnormal results are displayed)  Labs Reviewed - No data to display ____________________________________________  EKG  None ____________________________________________  RADIOLOGY  None ____________________________________________   PROCEDURES  Procedure(s) performed: No  Procedures   Critical Care performed: No ____________________________________________   INITIAL IMPRESSION / ASSESSMENT AND PLAN / ED COURSE  Pertinent labs & imaging results that were available during my care of the patient were reviewed by me and considered in my medical decision making (see chart for details).  Patient here only because he was unable to get [place], he feels well, we will give him a dose here.  He knows to call Dr. Nino Parsley office on Monday morning    ____________________________________________   FINAL CLINICAL IMPRESSION(S) / ED DIAGNOSES  Final diagnoses:  Arteriovenous fistula occlusion, subsequent encounter        Note:  This document was prepared using Dragon voice recognition software and may include unintentional dictation errors.    Lavonia Drafts, MD 09/25/18 [phone]

## 2018-09-25 NOTE — ED Triage Notes (Signed)
Pt states he was here last night and received medication for potassium, pt doesn't know if it was high or low. L arm fistula, states "its clogged up." last dialysis treatment Wednesday. So missed one treatment on Friday. States was given prescriptions but couldn't get them filled d/t money/insurance reasons. When asked why he is here today he states "they told me to come back if I didn't feel better."   states HA came on about 35 minutes ago and that is why he is here. Denies pain though. States "weird feeling." denies it being a migraine. No facial droop. No arm drift. No speech or vision changes. No weakness noted. Neuro exam clear for this RN.   A&O, ambulatory.

## 2018-09-28 ENCOUNTER — Encounter: Payer: Self-pay | Admitting: Gastroenterology

## 2018-09-28 ENCOUNTER — Ambulatory Visit: Payer: Medicare PPO

## 2018-09-29 ENCOUNTER — Encounter: Payer: Self-pay | Admitting: Emergency Medicine

## 2018-09-29 ENCOUNTER — Emergency Department
Admission: EM | Admit: 2018-09-29 | Discharge: 2018-09-29 | Disposition: A | Discharge status: Home / Self Care | Payer: Medicare PPO | Attending: Emergency Medicine | Admitting: Emergency Medicine

## 2018-09-29 DIAGNOSIS — I2510 Atherosclerotic heart disease of native coronary artery without angina pectoris: Secondary | ICD-10-CM | POA: Diagnosis not present

## 2018-09-29 DIAGNOSIS — Z992 Dependence on renal dialysis: Secondary | ICD-10-CM | POA: Diagnosis not present

## 2018-09-29 DIAGNOSIS — I120 Hypertensive chronic kidney disease with stage 5 chronic kidney disease or end stage renal disease: Secondary | ICD-10-CM | POA: Insufficient documentation

## 2018-09-29 DIAGNOSIS — J45909 Unspecified asthma, uncomplicated: Secondary | ICD-10-CM | POA: Diagnosis not present

## 2018-09-29 DIAGNOSIS — Z87891 Personal history of nicotine dependence: Secondary | ICD-10-CM | POA: Diagnosis not present

## 2018-09-29 DIAGNOSIS — Z0000 Encounter for general adult medical examination without abnormal findings: Secondary | ICD-10-CM | POA: Diagnosis not present

## 2018-09-29 DIAGNOSIS — N186 End stage renal disease: Secondary | ICD-10-CM | POA: Insufficient documentation

## 2018-09-29 DIAGNOSIS — Z79899 Other long term (current) drug therapy: Secondary | ICD-10-CM | POA: Diagnosis not present

## 2018-09-29 DIAGNOSIS — Z139 Encounter for screening, unspecified: Secondary | ICD-10-CM

## 2018-09-29 DIAGNOSIS — R339 Retention of urine, unspecified: Secondary | ICD-10-CM | POA: Diagnosis present

## 2018-09-29 LAB — CBC WITH DIFFERENTIAL/PLATELET
ABS IMMATURE GRANULOCYTES: 0.08 10*3/uL — AB (ref 0.00–0.07)
BASOS ABS: 0.1 10*3/uL (ref 0.0–0.1)
BASOS PCT: 1 %
EOS ABS: 0.3 10*3/uL (ref 0.0–0.5)
Eosinophils Relative: 3 %
HCT: 26.6 % — ABNORMAL LOW (ref 39.0–52.0)
Hemoglobin: 8.6 g/dL — ABNORMAL LOW (ref 13.0–17.0)
IMMATURE GRANULOCYTES: 1 %
Lymphocytes Relative: 8 %
Lymphs Abs: 0.7 10*3/uL (ref 0.7–4.0)
MCH: 30.9 pg (ref 26.0–34.0)
MCHC: 32.3 g/dL (ref 30.0–36.0)
MCV: 95.7 fL (ref 80.0–100.0)
MONOS PCT: 16 %
Monocytes Absolute: 1.5 10*3/uL — ABNORMAL HIGH (ref 0.1–1.0)
NEUTROS ABS: 6.5 10*3/uL (ref 1.7–7.7)
NEUTROS PCT: 71 %
NRBC: 0 % (ref 0.0–0.2)
PLATELETS: 229 10*3/uL (ref 150–400)
RBC: 2.78 MIL/uL — ABNORMAL LOW (ref 4.22–5.81)
RDW: 14.5 % (ref 11.5–15.5)
WBC: 9.1 10*3/uL (ref 4.0–10.5)

## 2018-09-29 LAB — BASIC METABOLIC PANEL
ANION GAP: 12 (ref 5–15)
BUN: 35 mg/dL — ABNORMAL HIGH (ref 8–23)
CALCIUM: 8.5 mg/dL — AB (ref 8.9–10.3)
CO2: 31 mmol/L (ref 22–32)
Chloride: 96 mmol/L — ABNORMAL LOW (ref 98–111)
Creatinine, Ser: 10.81 mg/dL — ABNORMAL HIGH (ref 0.61–1.24)
GFR, EST AFRICAN AMERICAN: 5 mL/min — AB (ref >60)
GFR, EST NON AFRICAN AMERICAN: 4 mL/min — AB (ref >60)
GLUCOSE: 137 mg/dL — AB (ref 70–99)
POTASSIUM: 4.1 mmol/L (ref 3.5–5.1)
SODIUM: 139 mmol/L (ref 135–145)

## 2018-09-29 NOTE — ED Notes (Addendum)
Pt reports having dialysis today   Pt has urinary catheter and states small amount of urine in leg bag today. Catheter in place for 2 weeks.    Pt reports pain with urination  No n/v.  No abd pain or back pain.  Pt alert.   Family with pt.

## 2018-09-29 NOTE — ED Notes (Signed)
Foley irrigation performed

## 2018-09-29 NOTE — ED Notes (Signed)
BLADDER SCAN RESULT  13ML

## 2018-09-29 NOTE — ED Notes (Signed)
Pt is AOx4, VSS, he denies any pain at this time. He Is in bed with rails upx2 and familly is at the bedside. We will continue to monitor.

## 2018-09-29 NOTE — ED Provider Notes (Signed)
[facility] Emergency Department Provider Note   ____________________________________________   First MD Initiated Contact with Patient 09/29/18 [phone]     (approximate)  I have reviewed the triage vital signs and the nursing notes.   HISTORY  Chief Complaint Urinary Retention    HPI Jeremy Rose is a 62 y.o. male who presents to the ED from home with a chief complaint of possible Foley catheter problem.  Patient has a history of ESRD on HD T/TH/SAT who had a Foley catheter placed 10 days ago for retention.  States he has not had urine in the leg bag since this morning.  However, patient fluid restricted himself yesterday plus had dialysis.  Complains of bladder spasms.  He is scheduled for Foley catheter removal at the urologist office in the next 1 to 2 days.  Denies fever, chills, chest pain, shortness of breath, abdominal pain, nausea or vomiting.   Past Medical History:  Diagnosis Date  . Arthritis    hands  . Asthma    uses inhaler  . Coronary artery disease   . Dialysis patient [facility]) 2012   Monday, Wednesday, Friday  . GERD (gastroesophageal reflux disease)   . Gout of right hand   . Hypertension   . Renal disorder    stage V..on dialysis  . Seizures ([place]) 2019   last seizure 5 years ago. had 20 in one day    Patient Active Problem List   Diagnosis Date Noted  . Urinary retention 09/17/2018  . Anemia 09/01/2018  . Blood in stool   . Duodenitis   . Acute gastritis without hemorrhage   . GIB (gastrointestinal bleeding) 08/30/2018  . Arm pain 04/29/2018  . Complication of AV dialysis fistula 10/02/2017  . Atypical chest pain 10/25/2014  . Coronary artery disease 10/25/2014  . Hypertension 10/25/2014  . ESRD (end stage renal disease) on dialysis ([place]) 10/25/2014  . Seizures ([place]) 10/25/2014    Past Surgical History:  Procedure Laterality Date  . A/V FISTULAGRAM Left 02/24/2017   Procedure: A/V Fistulagram;  Surgeon: Katha Cabal, MD;  Location: [place] CV LAB;  Service: Cardiovascular;  Laterality: Left;  . A/V SHUNT INTERVENTION N/A 02/24/2017   Procedure: A/V Shunt Intervention;  Surgeon: Katha Cabal, MD;  Location: [place] CV LAB;  Service: Cardiovascular;  Laterality: N/A;  . AV FISTULA PLACEMENT Left   . AV FISTULA PLACEMENT Left 12/02/2017   Procedure: ARTERIOVENOUS (AV) FISTULA CREATION ( BRACHIAL CEPHALIC );  Surgeon: Katha Cabal, MD;  Location: [facility] ORS;  Service: Vascular;  Laterality: Left;  . CARDIAC CATHETERIZATION  2012   no stents  . COLONOSCOPY Left 09/05/2018   Procedure: COLONOSCOPY;  Surgeon: Virgel Manifold, MD;  Location: [facility] ENDOSCOPY;  Service: Endoscopy;  Laterality: Left;  . DIALYSIS/PERMA CATHETER REMOVAL N/A 08/24/2018   Procedure: DIALYSIS/PERMA CATHETER REMOVAL;  Surgeon: Katha Cabal, MD;  Location: [place] CV LAB;  Service: Cardiovascular;  Laterality: N/A;  . ESOPHAGOGASTRODUODENOSCOPY Left 09/05/2018   Procedure: ESOPHAGOGASTRODUODENOSCOPY (EGD);  Surgeon: Virgel Manifold, MD;  Location: [facility] ENDOSCOPY;  Service: Endoscopy;  Laterality: Left;  . ESOPHAGOGASTRODUODENOSCOPY (EGD) WITH PROPOFOL N/A 08/31/2018   Procedure: ESOPHAGOGASTRODUODENOSCOPY (EGD) WITH PROPOFOL;  Surgeon: Lucilla Lame, MD;  Location: [facility] ENDOSCOPY;  Service: Endoscopy;  Laterality: N/A;  . GIVENS CAPSULE STUDY N/A 09/07/2018   Procedure: GIVENS CAPSULE STUDY;  Surgeon: Jonathon Bellows, MD;  Location: [facility] ENDOSCOPY;  Service: Gastroenterology;  Laterality: N/A;  . INSERTION OF DIALYSIS CATHETER  Left 05/28/2018   Procedure: INSERTION OF DIALYSIS CATHETER ( TUNNELED CATH INSERT );  Surgeon: Katha Cabal, MD;  Location: [facility] ORS;  Service: Vascular;  Laterality: Left;  . LIGATION OF ARTERIOVENOUS  FISTULA Left 12/02/2017   Procedure: LIGATION OF ARTERIOVENOUS  FISTULA ( REMOVAL LEFT WRIST FISTULA );  Surgeon: Katha Cabal, MD;  Location: [facility] ORS;  Service:  Vascular;  Laterality: Left;  . PERIPHERAL VASCULAR CATHETERIZATION Left 06/03/2016   Procedure: A/V Shuntogram/Fistulagram;  Surgeon: Katha Cabal, MD;  Location: [place] CV LAB;  Service: Cardiovascular;  Laterality: Left;  . PERIPHERAL VASCULAR CATHETERIZATION  06/03/2016   Procedure: Peripheral Vascular Balloon Angioplasty;  Surgeon: Katha Cabal, MD;  Location: [place] CV LAB;  Service: Cardiovascular;;  . REVISON OF ARTERIOVENOUS FISTULA Left 05/28/2018   Procedure: REVISON OF ARTERIOVENOUS FISTULA ( REMOVE ANEURYSM );  Surgeon: Katha Cabal, MD;  Location: [facility] ORS;  Service: Vascular;  Laterality: Left;  . UPPER EXTREMITY VENOGRAPHY N/A 04/13/2018   Procedure: UPPER EXTREMITY VENOGRAPHY;  Surgeon: Katha Cabal, MD;  Location: [place] CV LAB;  Service: Cardiovascular;  Laterality: N/A;    Prior to Admission medications   Medication Sig Start Date End Date Taking? Authorizing Provider  acetaminophen (TYLENOL) 500 MG tablet Take 1 tablet (500 mg total) by mouth every 6 (six) hours as needed for mild pain. 08/31/18   Salary, Avel Peace, MD  albuterol (PROVENTIL HFA;VENTOLIN HFA) 108 (90 BASE) MCG/ACT inhaler Inhale into the lungs every 6 (six) hours as needed for wheezing or shortness of breath.    [provider]  allopurinol (ZYLOPRIM) 100 MG tablet Take 200 mg by mouth daily.    [provider]  beclomethasone (QVAR) 80 MCG/ACT inhaler Inhale 1 puff into the lungs as needed (for shortness of breath).    [provider]  cinacalcet (SENSIPAR) 60 MG tablet Take 120 mg by mouth daily.     [provider]  cyanocobalamin 1000 MCG tablet Take 1,000 mcg daily by mouth.    [provider]  Lacosamide (VIMPAT) 150 MG TABS Take 150 mg by mouth 2 (two) times daily. Take an 3rd dose after dialysis on M-W-F    [provider]  levETIRAcetam (KEPPRA) 500 MG tablet Take 500 mg by mouth 2 (two) times daily. Take a  3rd dose of 500 mgs after dialysis only on dialysis days (M-W-F)    [provider]  oxyCODONE-acetaminophen (PERCOCET) 5-325 MG tablet Take 1 tablet by mouth every 4 (four) hours as needed for severe pain. 05/28/18 05/28/19  Schnier, Dolores Lory, MD  pantoprazole (PROTONIX) 40 MG tablet Take 1 tablet (40 mg total) by mouth 2 (two) times daily. 08/31/18   Salary, Avel Peace, MD  sevelamer carbonate (RENVELA) 800 MG tablet Take 2,400 mg by mouth 3 (three) times daily with meals.     [provider]  tamsulosin (FLOMAX) 0.4 MG CAPS capsule Take 1 capsule (0.4 mg total) by mouth daily. 09/20/18   Stoioff, Ronda Fairly, MD    Allergies Codeine and Doxycycline  Family History  Problem Relation Age of Onset  . Heart disease Mother   . Kidney disease Paternal Uncle     Social History Social History   Tobacco Use  . Smoking status: Former Smoker    Types: Cigarettes  . Smokeless tobacco: Former Systems developer    Quit date: 2010  . Tobacco comment: did not inhale!!  Substance Use Topics  . Alcohol use: No  . Drug  use: No    Review of Systems  Constitutional: No fever/chills Eyes: No visual changes. ENT: No sore throat. Cardiovascular: Denies chest pain. Respiratory: Denies shortness of breath. Gastrointestinal: No abdominal pain.  No nausea, no vomiting.  No diarrhea.  No constipation. Genitourinary: Positive for possible Foley catheter problem.  Negative for dysuria. Musculoskeletal: Negative for back pain. Skin: Negative for rash. Neurological: Negative for headaches, focal weakness or numbness.   ____________________________________________   PHYSICAL EXAM:  VITAL SIGNS: ED Triage Vitals  Enc Vitals Group     BP 09/29/18 0048 (!) 141/78     Pulse Rate 09/29/18 0048 97     Resp 09/29/18 0048 18     Temp 09/29/18 0048 98 F (36.7 C)     Temp Source 09/29/18 0048 Oral     SpO2 09/29/18 0048 98 %     Weight --      Height --      Head Circumference --      Peak Flow  --      Pain Score 09/29/18 0238 5     Pain Loc --      Pain Edu? --      Excl. in [place]? --     Constitutional: Alert and oriented.  Chronically ill appearing and in no acute distress. Eyes: Conjunctivae are normal. PERRL. EOMI. Head: Atraumatic. Nose: No congestion/rhinnorhea. Mouth/Throat: Mucous membranes are moist.  Oropharynx non-erythematous. Neck: No stridor.   Cardiovascular: Normal rate, regular rhythm. Grossly normal heart sounds.  Good peripheral circulation. Respiratory: Normal respiratory effort.  No retractions. Lungs CTAB. Gastrointestinal: Soft and nontender to light and deep palpation.  No distention. No abdominal bruits. No CVA tenderness. Genitourinary: Foley catheter in place.  Small amount of urine noted in leg bag. Musculoskeletal: No lower extremity tenderness nor edema.  No joint effusions. Neurologic:  Normal speech and language. No gross focal neurologic deficits are appreciated. No gait instability. Skin:  Skin is warm, dry and intact. No rash noted. Psychiatric: Mood and affect are normal. Speech and behavior are normal.  ____________________________________________   LABS (all labs ordered are listed, but only abnormal results are displayed)  Labs Reviewed  CBC WITH DIFFERENTIAL/PLATELET - Abnormal; Notable for the following components:      Result Value   RBC 2.78 (*)    Hemoglobin 8.6 (*)    HCT 26.6 (*)    Monocytes Absolute 1.5 (*)    Abs Immature Granulocytes 0.08 (*)    All other components within normal limits  BASIC METABOLIC PANEL - Abnormal; Notable for the following components:   Chloride 96 (*)    Glucose, Bld 137 (*)    BUN 35 (*)    Creatinine, Ser 10.81 (*)    Calcium 8.5 (*)    GFR calc non Af Amer 4 (*)    GFR calc Af Amer 5 (*)    All other components within normal limits  URINE CULTURE  URINALYSIS, COMPLETE (UACMP) WITH MICROSCOPIC    ____________________________________________  EKG  None ____________________________________________  RADIOLOGY  ED MD interpretation: None  Official radiology report(s): No results found.  ____________________________________________   PROCEDURES  Procedure(s) performed: None  Procedures  Critical Care performed: No  ____________________________________________   INITIAL IMPRESSION / ASSESSMENT AND PLAN / ED COURSE  As part of my medical decision making, I reviewed the following data within the electronic MEDICAL RECORD NUMBER History obtained from family, Nursing notes reviewed and incorporated, Labs reviewed and Notes from prior ED visits   62 year old  male with ESRD on HD T/TH/SAT with indwelling Foley catheter x10 days for retention who presents with possible catheter problem.  Bladder scan in triage with minimal amount (0 to 13 mL).  Given that patient fluid restriction himself plus had dialysis, it would not be unusual that he does not have large amount of urine in his Foley bag.  On a typical day, patient states he urinates small amounts up to 6 times daily.  Will check basic lab work, send urine for culture.  Nursing to check Foley catheter.  Patient has urology appointment in the next 1 to 2 days and does not desire to have the catheter removed.  Clinical Course as of Sep 29 538  Wed Sep 29, 2018  [id number] Updated patient on stable lab results.  Nurse was able to irrigate Foley without difficulty.  No evidence of urinary retention nor Foley malfunction.  Patient is scheduled to have Foley catheter removed by urology this morning.  Encouraged him to keep his appointment.  Strict return precautions given.  Patient and friend verbalize understanding and agree with plan of care.   [JS]    Clinical Course User Index [JS] Paulette Blanch, MD     ____________________________________________   FINAL CLINICAL IMPRESSION(S) / ED DIAGNOSES  Final diagnoses:  Encounter for  medical screening examination     ED Discharge Orders    None       Note:  This document was prepared using Dragon voice recognition software and may include unintentional dictation errors.    Paulette Blanch, MD 09/29/18 [phone]

## 2018-09-29 NOTE — ED Notes (Signed)
Report off to delicia rn

## 2018-09-29 NOTE — ED Triage Notes (Signed)
Pt had urinary catheter placed 10 days ago and today pt hasn't had urine in drainage bag since this AM. Pt with discomfort in lower abdomen. Pt is on dialysis and went to appointment this AM, unknown amount taken off.

## 2018-09-29 NOTE — Discharge Instructions (Addendum)
Return to the ER for worsening symptoms, persistent vomiting, difficulty breathing, fever or other concerns. °

## 2018-09-29 NOTE — ED Notes (Signed)
Pt's foley was cleaned, the leg bag was changed and a new securing device was placed.

## 2018-09-29 NOTE — ED Notes (Signed)
Pt is being discharged to home. AVS was given and explained to patient and visitor, they each verbalized understanding

## 2018-09-30 ENCOUNTER — Ambulatory Visit (INDEPENDENT_AMBULATORY_CARE_PROVIDER_SITE_OTHER): Payer: 59 | Admitting: Family Medicine

## 2018-09-30 DIAGNOSIS — R339 Retention of urine, unspecified: Secondary | ICD-10-CM

## 2018-09-30 NOTE — Progress Notes (Signed)
Catheter Removal  Patient is present today for a catheter removal.  94ml of water was drained from the balloon. A 16FR foley cath was removed from the bladder no complications were noted . Patient tolerated well.  Preformed by: Elberta Leatherwood, CMA  Follow up/ Additional notes: return at 3:00 for PVR  Bladder Scan Patient can void: 55 ml Performed By: Elberta Leatherwood, CMA

## 2018-10-01 LAB — BLADDER SCAN AMB NON-IMAGING: SCAN RESULT: 55

## 2018-10-06 DIAGNOSIS — E441 Mild protein-calorie malnutrition: Secondary | ICD-10-CM | POA: Insufficient documentation

## 2018-10-19 ENCOUNTER — Telehealth: Payer: Self-pay | Admitting: Urology

## 2018-10-19 MED ORDER — TAMSULOSIN HCL 0.4 MG PO CAPS: 0.4000 mg | ORAL_CAPSULE | Freq: Every day | ORAL | 1 refills | Status: DC

## 2018-10-19 NOTE — Telephone Encounter (Signed)
Refill sent to pharmacy.   

## 2018-10-19 NOTE — Telephone Encounter (Signed)
Pt uses [facility] in [place] and would like to know if Stoioff wants to keep him on this RX or not.  If so, pt needs a refill.

## 2018-10-21 ENCOUNTER — Ambulatory Visit: Payer: Medicare PPO | Admitting: Gastroenterology

## 2018-11-30 ENCOUNTER — Telehealth: Payer: Self-pay | Admitting: Urology

## 2018-11-30 NOTE — Telephone Encounter (Signed)
Should patient continue?

## 2018-11-30 NOTE — Telephone Encounter (Signed)
Jeremy Rose called on behalf of pt. She would like for someone to call her and let her know if the Rx for tamsulosin needs to be refilled. She states there are no refills left and wants to know if this medication needs to be discontinued. If the Rx is going to be refilled or discontinued  she would like a call back from the office to discuss this.

## 2018-12-01 NOTE — Telephone Encounter (Signed)
Patient did have foley out in November and was not replaced. Per Dr. Bernardo Heater patient needs follow up with a PVR to make sure patient is emptying. Patient is on dialysis mon,wed,fri. SPoke with patient's caregiver and made patient an appointment to come in tomorrow

## 2018-12-01 NOTE — Telephone Encounter (Signed)
I saw him on 11/1 and he was to follow-up for a catheter removal.  I do not see where this was ever done.  Does he still have the catheter?  If he is going to have a repeat voiding trial would recommend he stay on the tamsulosin.  If he is going to have a chronic indwelling Foley then he does not need to be on the medication.

## 2018-12-02 ENCOUNTER — Ambulatory Visit (INDEPENDENT_AMBULATORY_CARE_PROVIDER_SITE_OTHER): Payer: Medicare PPO | Admitting: Urology

## 2018-12-02 ENCOUNTER — Encounter: Payer: Self-pay | Admitting: Urology

## 2018-12-02 VITALS — BP 117/68 | HR 76 | Ht 68.0 in | Wt 161.0 lb

## 2018-12-02 DIAGNOSIS — N401 Enlarged prostate with lower urinary tract symptoms: Secondary | ICD-10-CM

## 2018-12-02 DIAGNOSIS — Z87898 Personal history of other specified conditions: Secondary | ICD-10-CM | POA: Diagnosis not present

## 2018-12-02 NOTE — Progress Notes (Signed)
12/02/2018 12:31 PM   ALDO SONDGEROTH [birth year] [id number]  Referring provider: Freddy Finner, NP [place],  [address]  Chief Complaint  Patient presents with  . Urinary Retention    follow up w/PVR    HPI: 63 year old male presents for follow-up of urinary retention.  He was initially seen November 2019 after an episode of urinary retention while hospitalized for GI bleed.  He had over 1000 mL of urine in the bladder and was initially on intermittent catheterization with subsequent Foley catheter placement.  He has been on hemodialysis for the past 4-5 years.  He is on tamsulosin.  After voiding trial a follow-up PVR was 55 mL on 11/14.  He presently states he is voiding without problems.  He notes an occasional split urinary stream.  He remains on tamsulosin.  PVR by bladder scan today was 0 mL.   PMH: Past Medical History:  Diagnosis Date  . Arthritis    hands  . Asthma    uses inhaler  . Coronary artery disease   . Dialysis patient [facility] 2012   Monday, Wednesday, Friday  . GERD (gastroesophageal reflux disease)   . Gout of right hand   . Hypertension   . Renal disorder    stage V..on dialysis  . Seizures ([place]) 2019   last seizure 5 years ago. had 20 in one day    Surgical History: Past Surgical History:  Procedure Laterality Date  . A/V FISTULAGRAM Left 02/24/2017   Procedure: A/V Fistulagram;  Surgeon: Katha Cabal, MD;  Location: [place];  Service: Cardiovascular;  Laterality: Left;  . A/V SHUNT INTERVENTION N/A 02/24/2017   Procedure: A/V Shunt Intervention;  Surgeon: Katha Cabal, MD;  Location: [place] [facility];  Service: Cardiovascular;  Laterality: N/A;  . AV FISTULA PLACEMENT Left   . AV FISTULA PLACEMENT Left 12/02/2017   Procedure: ARTERIOVENOUS (AV) FISTULA CREATION ( BRACHIAL CEPHALIC );  Surgeon: Katha Cabal, MD;  Location: [facility] ORS;  Service: Vascular;  Laterality: Left;  . CARDIAC  CATHETERIZATION  2012   no stents  . COLONOSCOPY Left 09/05/2018   Procedure: COLONOSCOPY;  Surgeon: Virgel Manifold, MD;  Location: [facility] ENDOSCOPY;  Service: Endoscopy;  Laterality: Left;  . DIALYSIS/PERMA CATHETER REMOVAL N/A 08/24/2018   Procedure: DIALYSIS/PERMA CATHETER REMOVAL;  Surgeon: Katha Cabal, MD;  Location: [place] [facility];  Service: Cardiovascular;  Laterality: N/A;  . ESOPHAGOGASTRODUODENOSCOPY Left 09/05/2018   Procedure: ESOPHAGOGASTRODUODENOSCOPY (EGD);  Surgeon: Virgel Manifold, MD;  Location: [facility] ENDOSCOPY;  Service: Endoscopy;  Laterality: Left;  . ESOPHAGOGASTRODUODENOSCOPY (EGD) WITH PROPOFOL N/A 08/31/2018   Procedure: ESOPHAGOGASTRODUODENOSCOPY (EGD) WITH PROPOFOL;  Surgeon: Lucilla Lame, MD;  Location: [facility] ENDOSCOPY;  Service: Endoscopy;  Laterality: N/A;  . GIVENS CAPSULE STUDY N/A 09/07/2018   Procedure: GIVENS CAPSULE STUDY;  Surgeon: Jonathon Bellows, MD;  Location: [facility] ENDOSCOPY;  Service: Gastroenterology;  Laterality: N/A;  . INSERTION OF DIALYSIS CATHETER Left 05/28/2018   Procedure: INSERTION OF DIALYSIS CATHETER ( TUNNELED CATH INSERT );  Surgeon: Katha Cabal, MD;  Location: [facility] ORS;  Service: Vascular;  Laterality: Left;  . LIGATION OF ARTERIOVENOUS  FISTULA Left 12/02/2017   Procedure: LIGATION OF ARTERIOVENOUS  FISTULA ( REMOVAL LEFT WRIST FISTULA );  Surgeon: Katha Cabal, MD;  Location: [facility] ORS;  Service: Vascular;  Laterality: Left;  . PERIPHERAL VASCULAR CATHETERIZATION Left 06/03/2016   Procedure: A/V Shuntogram/Fistulagram;  Surgeon: Katha Cabal, MD;  Location: [place]  [facility];  Service: Cardiovascular;  Laterality: Left;  . PERIPHERAL VASCULAR CATHETERIZATION  06/03/2016   Procedure: Peripheral Vascular Balloon Angioplasty;  Surgeon: Katha Cabal, MD;  Location: [place] [facility];  Service: Cardiovascular;;  . REVISON OF ARTERIOVENOUS FISTULA Left 05/28/2018   Procedure: REVISON OF ARTERIOVENOUS  FISTULA ( REMOVE ANEURYSM );  Surgeon: Katha Cabal, MD;  Location: [facility] ORS;  Service: Vascular;  Laterality: Left;  . UPPER EXTREMITY VENOGRAPHY N/A 04/13/2018   Procedure: UPPER EXTREMITY VENOGRAPHY;  Surgeon: Katha Cabal, MD;  Location: [place] [facility];  Service: Cardiovascular;  Laterality: N/A;    Home Medications:  Allergies as of 12/02/2018      Reactions   Codeine Nausea And Vomiting   Doxycycline Nausea And Vomiting      Medication List       Accurate as of December 02, 2018 12:31 PM. Always use your most recent med list.        acetaminophen 500 MG tablet Commonly known as:  TYLENOL Take 1 tablet (500 mg total) by mouth every 6 (six) hours as needed for mild pain.   albuterol 108 (90 Base) MCG/ACT inhaler Commonly known as:  PROVENTIL HFA;VENTOLIN HFA Inhale into the lungs every 6 (six) hours as needed for wheezing or shortness of breath.   allopurinol 100 MG tablet Commonly known as:  ZYLOPRIM Take 200 mg by mouth daily.   beclomethasone 80 MCG/ACT inhaler Commonly known as:  QVAR Inhale 1 puff into the lungs as needed (for shortness of breath).   cinacalcet 60 MG tablet Commonly known as:  SENSIPAR Take 120 mg by mouth daily.   cyanocobalamin 1000 MCG tablet Take 1,000 mcg daily by mouth.   folic acid 1 MG tablet Commonly known as:  FOLVITE Take 1 mg by mouth daily.   levETIRAcetam 500 MG tablet Commonly known as:  KEPPRA Take 500 mg by mouth 2 (two) times daily. Take a 3rd dose of 500 mgs after dialysis only on dialysis days (M-W-F)   omeprazole 20 MG capsule Commonly known as:  PRILOSEC Take 20 mg by mouth daily.   sevelamer carbonate 800 MG tablet Commonly known as:  RENVELA Take 2,400 mg by mouth 3 (three) times daily with meals.   VIMPAT 150 MG Tabs Generic drug:  Lacosamide Take 150 mg by mouth 2 (two) times daily. Take an 3rd dose after dialysis on M-W-F       Allergies:  Allergies  Allergen Reactions  . Codeine  Nausea And Vomiting  . Doxycycline Nausea And Vomiting    Family History: Family History  Problem Relation Age of Onset  . Heart disease Mother   . Kidney disease Paternal Uncle     Social History:  reports that he has quit smoking. His smoking use included cigarettes. He quit smokeless tobacco use about 10 years ago. He reports that he does not drink alcohol or use drugs.  ROS: UROLOGY Frequent Urination?: No Hard to postpone urination?: No Burning/pain with urination?: No Get up at night to urinate?: No Leakage of urine?: No Urine stream starts and stops?: Yes Trouble starting stream?: No Do you have to strain to urinate?: No Blood in urine?: No Urinary tract infection?: No Sexually transmitted disease?: No Injury to kidneys or bladder?: No Painful intercourse?: No Weak stream?: No Erection problems?: No Penile pain?: No  Gastrointestinal Nausea?: No Vomiting?: No Indigestion/heartburn?: Yes Diarrhea?: No Constipation?: No  Constitutional Fever: No Night sweats?: No Weight loss?: No Fatigue?: No  Skin  Skin rash/lesions?: No Itching?: No  Eyes Blurred vision?: No Double vision?: No  Ears/Nose/Throat Sore throat?: No Sinus problems?: No  Hematologic/Lymphatic Swollen glands?: No Easy bruising?: No  Cardiovascular Leg swelling?: No Chest pain?: No  Respiratory Cough?: No Shortness of breath?: Yes  Endocrine Excessive thirst?: No  Musculoskeletal Back pain?: No Joint pain?: Yes  Neurological Headaches?: No Dizziness?: No  Psychologic Depression?: No Anxiety?: No  Physical Exam: BP 117/68   Pulse 76   Ht 5\' 8"  (1.727 m)   Wt 161 lb (73 kg)   BMI 24.48 kg/m   Constitutional:  Alert and oriented, No acute distress. HEENT: [place] AT, moist mucus membranes.  Trachea midline, no masses. Cardiovascular: No clubbing, cyanosis, or edema. Respiratory: Normal respiratory effort, no increased work of breathing. Neurologic: Grossly intact,  no focal deficits, moving all 4 extremities. Psychiatric: Normal mood and affect.   Assessment & Plan:   63 year old male with recent episode of urinary retention which has resolved.  He has no complaints today.  Continue tamsulosin.  Chart review shows no recent PSA.  We discussed the pros and cons of prostate cancer screening and he requested to have his PSA drawn.  He will be notified with the results and if no significant abnormality follow-up in 1 year.     Abbie Sons, [place] [address], [place],  [address] [phone]

## 2018-12-03 LAB — PSA: PROSTATE SPECIFIC AG, SERUM: 2.2 ng/mL (ref 0.0–4.0)

## 2018-12-06 ENCOUNTER — Telehealth: Payer: Self-pay

## 2018-12-06 NOTE — Telephone Encounter (Signed)
Informed patient of results

## 2018-12-06 NOTE — Telephone Encounter (Signed)
-----   Message from Abbie Sons, MD sent at 12/05/2018  9:26 AM EST ----- PSA was normal at 2.2

## 2018-12-21 ENCOUNTER — Ambulatory Visit: Payer: Medicare PPO | Admitting: Gastroenterology

## 2018-12-23 ENCOUNTER — Ambulatory Visit (INDEPENDENT_AMBULATORY_CARE_PROVIDER_SITE_OTHER): Payer: Medicare PPO | Admitting: Podiatry

## 2018-12-23 ENCOUNTER — Encounter: Payer: Self-pay | Admitting: Podiatry

## 2018-12-23 DIAGNOSIS — M79676 Pain in unspecified toe(s): Secondary | ICD-10-CM

## 2018-12-23 DIAGNOSIS — B351 Tinea unguium: Secondary | ICD-10-CM | POA: Diagnosis not present

## 2018-12-23 DIAGNOSIS — L84 Corns and callosities: Secondary | ICD-10-CM

## 2018-12-23 DIAGNOSIS — M79609 Pain in unspecified limb: Principal | ICD-10-CM

## 2018-12-23 NOTE — Progress Notes (Signed)
This patient presents the office with chief complaint of elongated, thickened toenails.  Patient says the nails are painful walking and wearing his shoes.  He says he is unable to self treat.  Painful callus left foot during walking.   He presents the office today for preventative foot care services. Patient has not been seen for over 9 months.  General Appearance  Alert, conversant and in no acute stress.  Vascular  Dorsalis pedis and posterior pulses are palpable  bilaterally.  Capillary return is within normal limits  bilaterally. Temperature is within normal limits  Bilaterally.  Neurologic  Senn-Weinstein monofilament wire test within normal limits  bilaterally. Muscle power within normal limits bilaterally.  Nails Thick disfigured discolored nails with subungual debris bilaterally from hallux to fifth toes bilaterally. No evidence of bacterial infection or drainage bilaterally.  Orthopedic  No limitations of motion of motion feet bilaterally.  No crepitus or effusions noted.  HAV  B/L.  Skin  normotropic skin with no porokeratosis noted bilaterally.  No signs of infections or ulcers noted.  Callus sub 1,5  B/L.  Pinch callus hallux  B/L  Onychomycosis  B/L  Callus  B/L.  Debridement of nails  B/L.   Debridement of callus  B/L... RTC 3 months    Gardiner Barefoot DPM

## 2019-02-03 ENCOUNTER — Encounter (INDEPENDENT_AMBULATORY_CARE_PROVIDER_SITE_OTHER): Payer: Medicare PPO

## 2019-02-03 ENCOUNTER — Ambulatory Visit (INDEPENDENT_AMBULATORY_CARE_PROVIDER_SITE_OTHER): Payer: Medicare PPO | Admitting: Nurse Practitioner

## 2019-03-17 ENCOUNTER — Other Ambulatory Visit: Payer: Self-pay

## 2019-03-17 ENCOUNTER — Ambulatory Visit: Payer: Medicare PPO | Admitting: Podiatry

## 2019-06-01 IMAGING — NM NM GI BLOOD LOSS
4 series · 15 of 15 positions shown · non-contrast
Comparison: None.

CLINICAL DATA: Active GI bleeding.

EXAM:
NUCLEAR MEDICINE GASTROINTESTINAL BLEEDING SCAN
TECHNIQUE: Sequential abdominal images were obtained for 120 minutes following
intravenous administration of Zc-MMm labeled red blood cells.
RADIOPHARMACEUTICALS:  22.9 mCi Zc-MMm pertechnetate in-vitro
labeled red cells.

[Series 1000: gi bleed · 4.80mm/px · 6 of 60 frames shown (1 of 2)]
[frame 6/60]
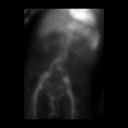
[frame 16/60]
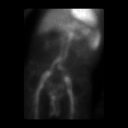
[frame 26/60]
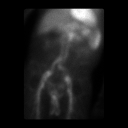
[frame 36/60]
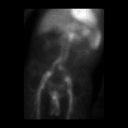
[frame 46/60]
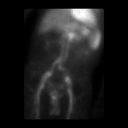
[frame 56/60]
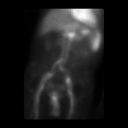

[Series 1000: gi bleed static · 2.40mm/px · 2 of 2 frames shown]
[frame 1/2]
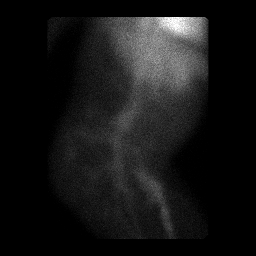
[frame 2/2]
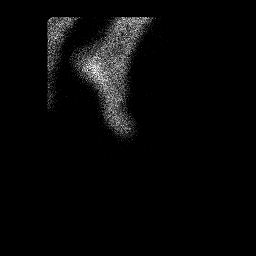

[Series 1000: gi bleed static save_screens · 1 of 1 slices shown]
[im 1/1]
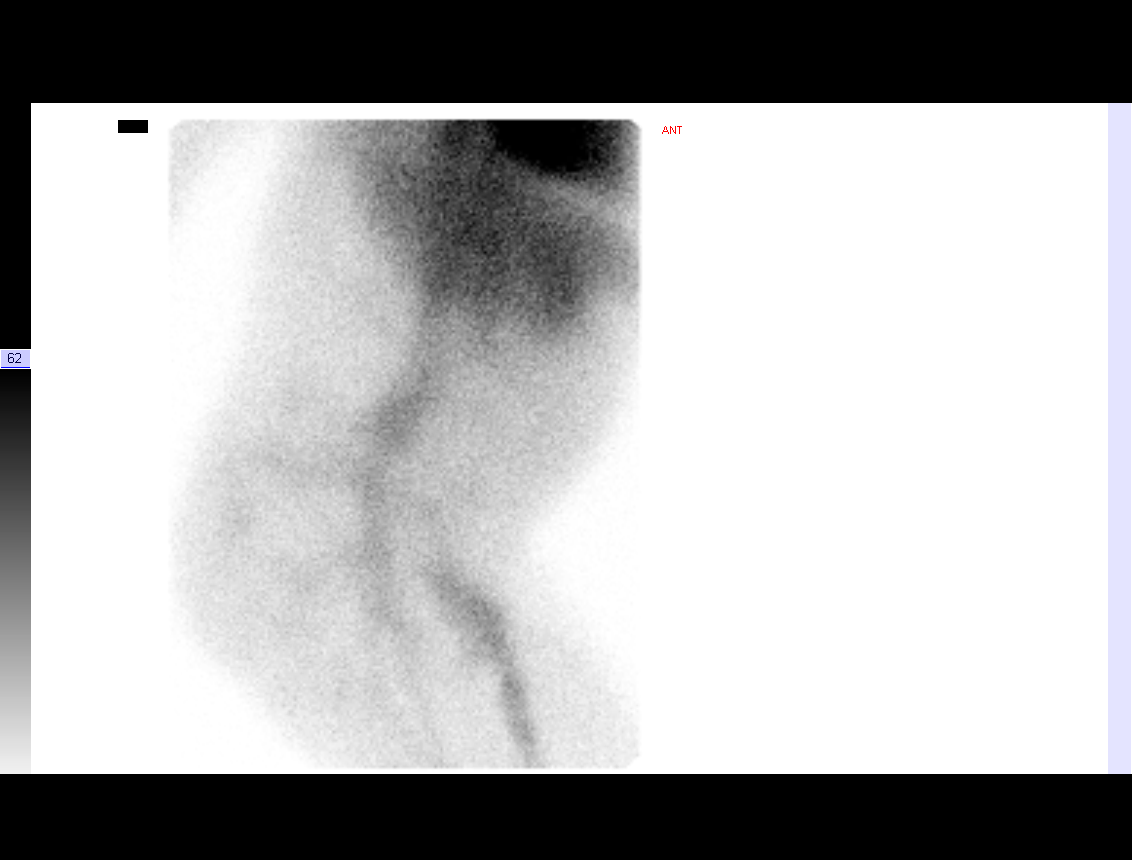

[Series 1000: gi bleed · 4.80mm/px · 6 of 60 frames shown (2 of 2)]
[frame 6/60]
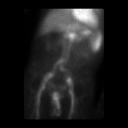
[frame 16/60]
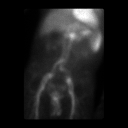
[frame 26/60]
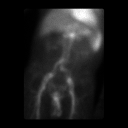
[frame 36/60]
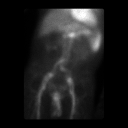
[frame 46/60]
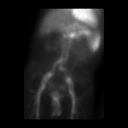
[frame 56/60]
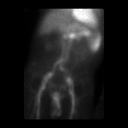

[15 of 15 positions shown; findings below may reference images not displayed]

FINDINGS: Physiologic distribution of activity is seen. Prominent activity
also noted in the area the genitals. No abnormal radiopharmaceutical
activity seen within the small or large bowel during the exam.
IMPRESSION: Negative for active GI bleeding.

## 2019-07-18 ENCOUNTER — Encounter (INDEPENDENT_AMBULATORY_CARE_PROVIDER_SITE_OTHER): Payer: Self-pay

## 2019-07-18 ENCOUNTER — Telehealth (INDEPENDENT_AMBULATORY_CARE_PROVIDER_SITE_OTHER): Payer: Self-pay

## 2019-07-18 NOTE — Telephone Encounter (Signed)
Spoke with the patient's sister and he is scheduled with [name] for 07/20/2019 with 9:45 am arrival time to the [facility]. Patient will do Covid testing on 07/19/2019 before 11:00 am at the [facility]. Patient's sister was given the pre-procedure instructions as well.

## 2019-07-19 ENCOUNTER — Other Ambulatory Visit: Payer: Self-pay

## 2019-07-19 ENCOUNTER — Other Ambulatory Visit (INDEPENDENT_AMBULATORY_CARE_PROVIDER_SITE_OTHER): Payer: Self-pay | Admitting: Nurse Practitioner

## 2019-07-19 ENCOUNTER — Other Ambulatory Visit
Admission: RE | Admit: 2019-07-19 | Discharge: 2019-07-19 | Disposition: A | Discharge status: Home / Self Care | Payer: Medicare PPO | Source: Ambulatory Visit | Attending: Vascular Surgery | Admitting: Vascular Surgery

## 2019-07-19 DIAGNOSIS — Z01812 Encounter for preprocedural laboratory examination: Secondary | ICD-10-CM | POA: Diagnosis present

## 2019-07-19 DIAGNOSIS — N186 End stage renal disease: Secondary | ICD-10-CM | POA: Insufficient documentation

## 2019-07-19 DIAGNOSIS — Z20828 Contact with and (suspected) exposure to other viral communicable diseases: Secondary | ICD-10-CM | POA: Insufficient documentation

## 2019-07-19 LAB — SARS CORONAVIRUS 2 (TAT 6-24 HRS): SARS Coronavirus 2: NEGATIVE

## 2019-07-20 ENCOUNTER — Other Ambulatory Visit: Payer: Self-pay

## 2019-07-20 ENCOUNTER — Ambulatory Visit
Admission: RE | Admit: 2019-07-20 | Discharge: 2019-07-20 | Disposition: A | Discharge status: Home / Self Care | Payer: Medicare PPO | Attending: Vascular Surgery | Admitting: Vascular Surgery

## 2019-07-20 ENCOUNTER — Encounter
Admission: RE | Disposition: A | Discharge status: Home / Self Care | Payer: Self-pay | Source: Home / Self Care | Attending: Vascular Surgery

## 2019-07-20 ENCOUNTER — Encounter: Payer: Self-pay | Admitting: *Deleted

## 2019-07-20 DIAGNOSIS — T82858A Stenosis of vascular prosthetic devices, implants and grafts, initial encounter: Secondary | ICD-10-CM | POA: Diagnosis present

## 2019-07-20 DIAGNOSIS — Z992 Dependence on renal dialysis: Secondary | ICD-10-CM | POA: Diagnosis not present

## 2019-07-20 DIAGNOSIS — M1990 Unspecified osteoarthritis, unspecified site: Secondary | ICD-10-CM | POA: Insufficient documentation

## 2019-07-20 DIAGNOSIS — Z885 Allergy status to narcotic agent status: Secondary | ICD-10-CM | POA: Diagnosis not present

## 2019-07-20 DIAGNOSIS — Y841 Kidney dialysis as the cause of abnormal reaction of the patient, or of later complication, without mention of misadventure at the time of the procedure: Secondary | ICD-10-CM | POA: Diagnosis not present

## 2019-07-20 DIAGNOSIS — I2510 Atherosclerotic heart disease of native coronary artery without angina pectoris: Secondary | ICD-10-CM | POA: Insufficient documentation

## 2019-07-20 DIAGNOSIS — Z87891 Personal history of nicotine dependence: Secondary | ICD-10-CM | POA: Diagnosis not present

## 2019-07-20 DIAGNOSIS — J45909 Unspecified asthma, uncomplicated: Secondary | ICD-10-CM | POA: Insufficient documentation

## 2019-07-20 DIAGNOSIS — I120 Hypertensive chronic kidney disease with stage 5 chronic kidney disease or end stage renal disease: Secondary | ICD-10-CM | POA: Diagnosis not present

## 2019-07-20 DIAGNOSIS — N186 End stage renal disease: Secondary | ICD-10-CM | POA: Insufficient documentation

## 2019-07-20 DIAGNOSIS — Z881 Allergy status to other antibiotic agents status: Secondary | ICD-10-CM | POA: Diagnosis not present

## 2019-07-20 DIAGNOSIS — E1122 Type 2 diabetes mellitus with diabetic chronic kidney disease: Secondary | ICD-10-CM | POA: Diagnosis not present

## 2019-07-20 DIAGNOSIS — K219 Gastro-esophageal reflux disease without esophagitis: Secondary | ICD-10-CM | POA: Diagnosis not present

## 2019-07-20 DIAGNOSIS — T82898A Other specified complication of vascular prosthetic devices, implants and grafts, initial encounter: Secondary | ICD-10-CM

## 2019-07-20 HISTORY — PX: A/V FISTULAGRAM: CATH118298

## 2019-07-20 LAB — POTASSIUM (ARMC VASCULAR LAB ONLY): Potassium (ARMC vascular lab): 5.8 — ABNORMAL HIGH (ref 3.5–5.1)

## 2019-07-20 SURGERY — A/V FISTULAGRAM
Anesthesia: Moderate Sedation | Laterality: Left

## 2019-07-20 MED ORDER — MIDAZOLAM HCL 2 MG/2ML IJ SOLN
INTRAMUSCULAR | Status: DC | PRN
  Administered 2019-07-20: 2 mg via INTRAVENOUS

## 2019-07-20 MED ORDER — CEFAZOLIN SODIUM-DEXTROSE 1-4 GM/50ML-% IV SOLN
1.0000 g | Freq: Once | INTRAVENOUS | Status: AC
  Administered 2019-07-20: 08:00:00 1 g via INTRAVENOUS

## 2019-07-20 MED ORDER — SODIUM CHLORIDE 0.9 % IV SOLN
INTRAVENOUS | Status: DC
  Administered 2019-07-20: 08:00:00 via INTRAVENOUS

## 2019-07-20 MED ORDER — MIDAZOLAM HCL 5 MG/5ML IJ SOLN
INTRAMUSCULAR | Status: AC
  Filled 2019-07-20: qty 5

## 2019-07-20 MED ORDER — HYDROMORPHONE HCL 1 MG/ML IJ SOLN: 1.0000 mg | Freq: Once | INTRAMUSCULAR | Status: DC | PRN

## 2019-07-20 MED ORDER — FAMOTIDINE 20 MG PO TABS: 40.0000 mg | ORAL_TABLET | Freq: Once | ORAL | Status: DC | PRN

## 2019-07-20 MED ORDER — MIDAZOLAM HCL 2 MG/ML PO SYRP: 8.0000 mg | ORAL_SOLUTION | Freq: Once | ORAL | Status: DC | PRN

## 2019-07-20 MED ORDER — DIPHENHYDRAMINE HCL 50 MG/ML IJ SOLN: 50.0000 mg | Freq: Once | INTRAMUSCULAR | Status: DC | PRN

## 2019-07-20 MED ORDER — IODIXANOL 320 MG/ML IV SOLN
INTRAVENOUS | Status: DC | PRN
  Administered 2019-07-20: 35 mL via INTRAVENOUS

## 2019-07-20 MED ORDER — CEFAZOLIN SODIUM-DEXTROSE 1-4 GM/50ML-% IV SOLN
INTRAVENOUS | Status: AC
  Administered 2019-07-20: 08:00:00 1 g via INTRAVENOUS
  Filled 2019-07-20: qty 50

## 2019-07-20 MED ORDER — HEPARIN SODIUM (PORCINE) 1000 UNIT/ML IJ SOLN
INTRAMUSCULAR | Status: AC
  Administered 2019-07-20: 09:00:00 3000 [IU]
  Filled 2019-07-20: qty 1

## 2019-07-20 MED ORDER — FENTANYL CITRATE (PF) 100 MCG/2ML IJ SOLN
INTRAMUSCULAR | Status: AC
  Filled 2019-07-20: qty 2

## 2019-07-20 MED ORDER — METHYLPREDNISOLONE SODIUM SUCC 125 MG IJ SOLR: 125.0000 mg | Freq: Once | INTRAMUSCULAR | Status: DC | PRN

## 2019-07-20 MED ORDER — FENTANYL CITRATE (PF) 100 MCG/2ML IJ SOLN
INTRAMUSCULAR | Status: DC | PRN
  Administered 2019-07-20: 50 ug via INTRAVENOUS

## 2019-07-20 MED ORDER — ONDANSETRON HCL 4 MG/2ML IJ SOLN: 4.0000 mg | Freq: Four times a day (QID) | INTRAMUSCULAR | Status: DC | PRN

## 2019-07-20 SURGICAL SUPPLY — 10 items
BALLN ULTRVRSE 7X60X75C (BALLOONS) ×3
BALLOON ULTRVRSE 7X60X75C (BALLOONS) ×1 IMPLANT
CANNULA 5F STIFF (CANNULA) ×3 IMPLANT
DEVICE PRESTO INFLATION (MISCELLANEOUS) ×3 IMPLANT
DRAPE BRACHIAL (DRAPES) ×3 IMPLANT
PACK ANGIOGRAPHY (CUSTOM PROCEDURE TRAY) ×3 IMPLANT
SHEATH BRITE TIP 6FRX5.5 (SHEATH) ×3 IMPLANT
SUT MNCRL AB 4-0 PS2 18 (SUTURE) ×9 IMPLANT
TOWEL OR 17X26 4PK STRL BLUE (TOWEL DISPOSABLE) ×3 IMPLANT
WIRE MAGIC TOR.035 180C (WIRE) ×3 IMPLANT

## 2019-07-20 NOTE — Op Note (Signed)
[facility]    OPERATIVE NOTE   PROCEDURE: 1.   Left brachiocephalic arteriovenous fistula cannulation under ultrasound guidance 2.   Left arm fistulagram including central venogram 3.   Percutaneous transluminal angioplasty of the distal upper arm stenosis between the native cephalic vein and the Artegraft revision near the original arteriovenous anastomosis with a 7 mm diameter by 6 cm length angioplasty balloon  PRE-OPERATIVE DIAGNOSIS: 1. ESRD 2. 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  Mild narrowing of the Artegraft from hyperplasia but nothing greater than 50%.  The remainder of the fistula in the central venous circulation was patent with no significant stenosis.  SPECIMEN(S):  None  CONTRAST: 35 cc  FLUORO TIME: 1.1 minutes  MODERATE CONSCIOUS SEDATION TIME: Approximately 20 minutes with 2 mg of Versed and 50 Mcg of Fentanyl   INDICATIONS: Jeremy Rose is a 63 y.o. male who presents with malfunctioning left brachiocephalic arteriovenous fistula that has already had a previous Artegraft revision of the brachiocephalic fistula.  The patient is scheduled for left arm fistulagram.  The patient is aware the risks include but are not limited to: bleeding, infection, thrombosis of the cannulated access, and possible anaphylactic reaction to the contrast.  The patient is aware of the risks of the procedure and elects to proceed forward.  DESCRIPTION: After full informed written consent was obtained, the patient was brought back to the angiography suite and placed supine upon the angiography table.  The  patient was connected to monitoring equipment. Moderate conscious sedation was administered with a face to face encounter with the patient throughout the procedure with my supervision of the RN administering medicines and monitoring the patient's vital signs and mental status throughout from the start of the procedure until the patient was taken to the recovery room. The left arm was prepped and draped in the standard fashion for a percutaneous access intervention.  Under ultrasound guidance, the left brachiocephalic arteriovenous fistula was cannulated with a micropuncture needle under direct ultrasound guidance between the original arterial venous anastomosis and the Artegraft to cephalic vein anastomosis where it was patent and a permanent image was performed.  The microwire was advanced into the fistula and the needle was exchanged for the a microsheath.  I then upsized to a 6 Fr Sheath and imaging was performed.  Hand injections were completed to image the access including the central venous system. This demonstrated yperplastic stenosis of the anastomosis of the native cephalic vein to the Artegraft revision near the original arteriovenous anastomosis in the distal upper arm in the 70 to 80% range.  Mild narrowing of the Artegraft from hyperplasia but nothing greater than 50%.  The remainder of the fistula in the central venous circulation was patent with no significant stenosis.  Based on the images, this patient will need intervention to the stenosis. I then gave the patient 3000 units of intravenous heparin.  I then crossed the stenosis with a Magic Tourqe wire.  Based on the imaging, a 7 mm x 6 cm   angioplasty balloon was selected.  The balloon was centered around the distal upper arm fistula to Artegraft anastomosis stenosis and inflated to 14  ATM for 1 minute(s).  On completion imaging, a less than 10 % residual stenosis was present.     Based on the completion imaging, no further intervention is  necessary.  The wire and balloon were removed from the sheath.  A 4-0 Monocryl purse-string suture was sewn around the sheath.  The sheath was removed while tying down the suture.  A sterile bandage was applied to the puncture site.  COMPLICATIONS: None  CONDITION: Stable   [name]  07/20/2019 8:41 AM   This note was created with Dragon Medical transcription system. Any errors in dictation are purely unintentional.

## 2019-07-20 NOTE — H&P (Signed)
[facility] Admission History & Physical  MRN : [id number]  Jeremy Rose is a 63 y.o. ([birth year]) male who presents with chief complaint of No chief complaint on file. Marland Kitchen  History of Present Illness: I am asked to evaluate the patient by the dialysis center. The patient was sent here because they were unable to achieve adequate dialysis this morning. Furthermore the Center states there is very poor thrill and bruit. The patient states there there have been increasing problems with the access, such as "pulling clots" during dialysis and prolonged bleeding after decannulation. The patient estimates these problems have been going on for several weeks. The patient is unaware of any other change.  Patient denies pain or tenderness overlying the access.  There is no pain with dialysis.  The patient denies hand pain or finger pain consistent with steal syndrome.   There have been past interventions or declots of this access.  The patient is not chronically hypotensive on dialysis.  Current Facility-Administered Medications  Medication Dose Route Frequency Provider Last Rate Last Dose  . fentaNYL (SUBLIMAZE) 100 MCG/2ML injection           . heparin 1000 UNIT/ML injection           . midazolam (VERSED) 5 MG/5ML injection           . 0.9 %  sodium chloride infusion   Intravenous Continuous Kris Hartmann, NP 10 mL/hr at 07/20/19 [phone]    . ceFAZolin (ANCEF) 1-4 GM/50ML-% IVPB           . ceFAZolin (ANCEF) IVPB 1 g/50 mL premix  1 g Intravenous Once Kris Hartmann, NP      . diphenhydrAMINE (BENADRYL) injection 50 mg  50 mg Intravenous Once PRN Kris Hartmann, NP      . famotidine (PEPCID) tablet 40 mg  40 mg Oral Once PRN Kris Hartmann, NP      . HYDROmorphone (DILAUDID) injection 1 mg  1 mg Intravenous Once PRN [name] E, NP      . methylPREDNISolone sodium succinate (SOLU-MEDROL) 125 mg/2 mL injection 125 mg  125 mg Intravenous Once PRN [name] E,  NP      . midazolam (VERSED) 2 MG/ML syrup 8 mg  8 mg Oral Once PRN Kris Hartmann, NP      . ondansetron (ZOFRAN) injection 4 mg  4 mg Intravenous Q6H PRN Kris Hartmann, NP        Past Medical History:  Diagnosis Date  . Arthritis    hands  . Asthma    uses inhaler  . Coronary artery disease   . Dialysis patient [facility]) 2012   Monday, Wednesday, Friday  . GERD (gastroesophageal reflux disease)   . Gout of right hand   . Hypertension   . Renal disorder    stage V..on dialysis  . Seizures ([place]) 2019   last seizure 5 years ago. had 20 in one day    Past Surgical History:  Procedure Laterality Date  . A/V FISTULAGRAM Left 02/24/2017   Procedure: A/V Fistulagram;  Surgeon: Katha Cabal, MD;  Location: [place] [facility];  Service: Cardiovascular;  Laterality: Left;  . A/V SHUNT INTERVENTION N/A 02/24/2017   Procedure: A/V Shunt Intervention;  Surgeon: Katha Cabal, MD;  Location: [place] [facility];  Service: Cardiovascular;  Laterality: N/A;  . AV FISTULA PLACEMENT Left   . AV FISTULA PLACEMENT Left 12/02/2017   Procedure: ARTERIOVENOUS (  AV) FISTULA CREATION ( BRACHIAL CEPHALIC );  Surgeon: Katha Cabal, MD;  Location: [facility] ORS;  Service: Vascular;  Laterality: Left;  . CARDIAC CATHETERIZATION  2012   no stents  . COLONOSCOPY Left 09/05/2018   Procedure: COLONOSCOPY;  Surgeon: Virgel Manifold, MD;  Location: [facility] ENDOSCOPY;  Service: Endoscopy;  Laterality: Left;  . DIALYSIS/PERMA CATHETER REMOVAL N/A 08/24/2018   Procedure: DIALYSIS/PERMA CATHETER REMOVAL;  Surgeon: Katha Cabal, MD;  Location: [place] [facility];  Service: Cardiovascular;  Laterality: N/A;  . ESOPHAGOGASTRODUODENOSCOPY Left 09/05/2018   Procedure: ESOPHAGOGASTRODUODENOSCOPY (EGD);  Surgeon: Virgel Manifold, MD;  Location: [facility] ENDOSCOPY;  Service: Endoscopy;  Laterality: Left;  . ESOPHAGOGASTRODUODENOSCOPY (EGD) WITH PROPOFOL N/A 08/31/2018   Procedure:  ESOPHAGOGASTRODUODENOSCOPY (EGD) WITH PROPOFOL;  Surgeon: Lucilla Lame, MD;  Location: [facility] ENDOSCOPY;  Service: Endoscopy;  Laterality: N/A;  . GIVENS CAPSULE STUDY N/A 09/07/2018   Procedure: GIVENS CAPSULE STUDY;  Surgeon: Jonathon Bellows, MD;  Location: [facility] ENDOSCOPY;  Service: Gastroenterology;  Laterality: N/A;  . INSERTION OF DIALYSIS CATHETER Left 05/28/2018   Procedure: INSERTION OF DIALYSIS CATHETER ( TUNNELED CATH INSERT );  Surgeon: Katha Cabal, MD;  Location: [facility] ORS;  Service: Vascular;  Laterality: Left;  . LIGATION OF ARTERIOVENOUS  FISTULA Left 12/02/2017   Procedure: LIGATION OF ARTERIOVENOUS  FISTULA ( REMOVAL LEFT WRIST FISTULA );  Surgeon: Katha Cabal, MD;  Location: [facility] ORS;  Service: Vascular;  Laterality: Left;  . PERIPHERAL VASCULAR CATHETERIZATION Left 06/03/2016   Procedure: A/V Shuntogram/Fistulagram;  Surgeon: Katha Cabal, MD;  Location: [place] [facility];  Service: Cardiovascular;  Laterality: Left;  . PERIPHERAL VASCULAR CATHETERIZATION  06/03/2016   Procedure: Peripheral Vascular Balloon Angioplasty;  Surgeon: Katha Cabal, MD;  Location: [place] [facility];  Service: Cardiovascular;;  . REVISON OF ARTERIOVENOUS FISTULA Left 05/28/2018   Procedure: REVISON OF ARTERIOVENOUS FISTULA ( REMOVE ANEURYSM );  Surgeon: Katha Cabal, MD;  Location: [facility] ORS;  Service: Vascular;  Laterality: Left;  . UPPER EXTREMITY VENOGRAPHY N/A 04/13/2018   Procedure: UPPER EXTREMITY VENOGRAPHY;  Surgeon: Katha Cabal, MD;  Location: [place] [facility];  Service: Cardiovascular;  Laterality: N/A;    Social History Social History   Tobacco Use  . Smoking status: Former Smoker    Types: Cigarettes  . Smokeless tobacco: Former Systems developer    Quit date: 2010  . Tobacco comment: did not inhale!!  Substance Use Topics  . Alcohol use: No  . Drug use: No    Family History Family History  Problem Relation Age of Onset  . Heart disease Mother   . Kidney  disease Paternal Uncle     No family history of bleeding or clotting disorders, autoimmune disease or porphyria  Allergies  Allergen Reactions  . Codeine Nausea And Vomiting  . Doxycycline Nausea And Vomiting     REVIEW OF SYSTEMS (Negative unless checked)  Constitutional: [] Weight loss  [] Fever  [] Chills Cardiac: [] Chest pain   [] Chest pressure   [] Palpitations   [] Shortness of breath when laying flat   [] Shortness of breath at rest   [x] Shortness of breath with exertion. Vascular:  [] Pain in legs with walking   [] Pain in legs at rest   [] Pain in legs when laying flat   [] Claudication   [] Pain in feet when walking  [] Pain in feet at rest  [] Pain in feet when laying flat   [] History of DVT   [] Phlebitis   [] Swelling in legs   [] Varicose veins   []   Non-healing ulcers Pulmonary:   [] Uses home oxygen   [] Productive cough   [] Hemoptysis   [] Wheeze  [] COPD   [x] Asthma Neurologic:  [] Dizziness  [] Blackouts   [x] Seizures   [] History of stroke   [] History of TIA  [] Aphasia   [] Temporary blindness   [] Dysphagia   [] Weakness or numbness in arms   [] Weakness or numbness in legs Musculoskeletal:  [x] Arthritis   [] Joint swelling   [] Joint pain   [] Low back pain Hematologic:  [] Easy bruising  [] Easy bleeding   [] Hypercoagulable state   [] Anemic  [] Hepatitis Gastrointestinal:  [] Blood in stool   [] Vomiting blood  [x] Gastroesophageal reflux/heartburn   [] Difficulty swallowing. Genitourinary:  [x] Chronic kidney disease   [] Difficult urination  [] Frequent urination  [] Burning with urination   [] Blood in urine Skin:  [] Rashes   [] Ulcers   [] Wounds Psychological:  [] History of anxiety   []  History of major depression.  Physical Examination  Vitals:   07/20/19 0730  BP: 122/83  Pulse: (!) 57  Resp: 18  Temp: 98 F (36.7 C)  TempSrc: Oral  SpO2: 97%  Weight: 80.7 kg  Height: 5\' 8"  (1.727 m)   Body mass index is 27.06 kg/m. Gen: WD/WN, NAD Head: [place]/AT, No temporalis wasting.  Ear/Nose/Throat:  Hearing grossly intact, nares w/o erythema or drainage, oropharynx w/o Erythema/Exudate,  Eyes: Conjunctiva clear, sclera non-icteric Neck: Trachea midline.  No JVD.  Pulmonary:  Good air movement, respirations not labored, no use of accessory muscles.  Cardiac: RRR, normal S1, S2. Vascular: good thrill and bruit in left arm access Vessel Right Left  Radial Palpable Palpable   Musculoskeletal: M/S 5/5 throughout.  Extremities without ischemic changes.  No deformity or atrophy.  Neurologic: Sensation grossly intact in extremities.  Symmetrical.  Speech is fluent. Motor exam as listed above. Psychiatric: Judgment intact, Mood & affect appropriate for pt's clinical situation. Dermatologic: No rashes or ulcers noted.  No cellulitis or open wounds.    CBC Lab Results  Component Value Date   WBC 9.1 09/29/2018   HGB 8.6 (L) 09/29/2018   HCT 26.6 (L) 09/29/2018   MCV 95.7 09/29/2018   PLT 229 09/29/2018    BMET    Component Value Date/Time   NA 139 09/29/2018 0404   NA 138 05/18/2014 2142   K 4.1 09/29/2018 0404   K 5.0 06/28/2014 1254   CL 96 (L) 09/29/2018 0404   CL 98 05/18/2014 2142   CO2 31 09/29/2018 0404   CO2 33 (H) 05/18/2014 2142   GLUCOSE 137 (H) 09/29/2018 0404   GLUCOSE 88 05/18/2014 2142   BUN 35 (H) 09/29/2018 0404   BUN 12 05/18/2014 2142   CREATININE 10.81 (H) 09/29/2018 0404   CREATININE 5.73 (H) 05/18/2014 2142   CALCIUM 8.5 (L) 09/29/2018 0404   CALCIUM 8.3 (L) 05/18/2014 2142   GFRNONAA 4 (L) 09/29/2018 0404   GFRNONAA 10 (L) 05/18/2014 2142   GFRAA 5 (L) 09/29/2018 0404   GFRAA 12 (L) 05/18/2014 2142   CrCl cannot be calculated (Patient's most recent lab result is older than the maximum 21 days allowed.).  COAG Lab Results  Component Value Date   INR 0.94 05/18/2018   INR 0.94 11/24/2017    Radiology No results found.  Assessment/Plan 1.  Complication dialysis device with dysfunction of AV access:  Patient's dialysis access is  malfunctioning. The patient will undergo angiography and correction of any problems using interventional techniques with the hope of restoring function to the access.  The risks and benefits were  described to the patient.  All questions were answered.  The patient agrees to proceed with angiography and intervention. Potassium will be drawn to ensure that it is an appropriate level prior to performing intervention. 2.  End-stage renal disease requiring hemodialysis:  Patient will continue dialysis therapy without further interruption if a successful intervention is not achieved then a tunneled catheter will be placed. Dialysis has already been arranged. 3.  Hypertension:  Patient will continue medical management; nephrology is following no changes in oral medications. 4. Diabetes mellitus:  Glucose will be monitored and oral medications been held this morning once the patient has undergone the patient's procedure po intake will be reinitiated and again Accu-Cheks will be used to assess the blood glucose level and treat as needed. The patient will be restarted on the patient's usual hypoglycemic regime 5.  Coronary artery disease:  EKG will be monitored. Nitrates will be used if needed. The patient's oral cardiac medications will be continued.    [name], MD  07/20/2019 8:05 AM

## 2019-08-12 ENCOUNTER — Encounter: Payer: Self-pay | Admitting: *Deleted

## 2019-08-30 ENCOUNTER — Encounter (INDEPENDENT_AMBULATORY_CARE_PROVIDER_SITE_OTHER): Payer: Medicare PPO

## 2019-08-30 ENCOUNTER — Ambulatory Visit (INDEPENDENT_AMBULATORY_CARE_PROVIDER_SITE_OTHER): Payer: Medicare PPO | Admitting: Nurse Practitioner

## 2019-08-31 ENCOUNTER — Encounter (INDEPENDENT_AMBULATORY_CARE_PROVIDER_SITE_OTHER): Payer: Medicare PPO

## 2019-08-31 ENCOUNTER — Ambulatory Visit (INDEPENDENT_AMBULATORY_CARE_PROVIDER_SITE_OTHER): Payer: Medicare PPO | Admitting: Nurse Practitioner

## 2019-09-08 ENCOUNTER — Encounter: Payer: Self-pay | Admitting: Podiatry

## 2019-09-08 ENCOUNTER — Ambulatory Visit (INDEPENDENT_AMBULATORY_CARE_PROVIDER_SITE_OTHER): Payer: Medicare PPO | Admitting: Podiatry

## 2019-09-08 ENCOUNTER — Other Ambulatory Visit: Payer: Self-pay

## 2019-09-08 DIAGNOSIS — M79676 Pain in unspecified toe(s): Secondary | ICD-10-CM | POA: Diagnosis not present

## 2019-09-08 DIAGNOSIS — B351 Tinea unguium: Secondary | ICD-10-CM

## 2019-09-08 DIAGNOSIS — L84 Corns and callosities: Secondary | ICD-10-CM

## 2019-09-08 DIAGNOSIS — M79609 Pain in unspecified limb: Secondary | ICD-10-CM

## 2019-09-08 NOTE — Progress Notes (Signed)
This patient presents the office with chief complaint of elongated, thickened toenails.  Patient says the nails are painful walking and wearing his shoes.  He says he is unable to self treat.  Painful callus both heels.   He presents the office today for preventative foot care services. Patient has not been seen in over 8 months.  General Appearance  Alert, conversant and in no acute stress.  Vascular  Dorsalis pedis and posterior pulses are weakly palpable  bilaterally.  Capillary return is within normal limits  bilaterally. Temperature is within normal limits  Bilaterally.  Neurologic  Senn-Weinstein monofilament wire test within normal limits  bilaterally. Muscle power within normal limits bilaterally.  Nails Thick disfigured discolored nails with subungual debris bilaterally from hallux to fifth toes bilaterally. No evidence of bacterial infection or drainage bilaterally.  Orthopedic  No limitations of motion of motion feet bilaterally.  No crepitus or effusions noted.  HAV  B/L.  Skin  normotropic skin with no porokeratosis noted bilaterally.  No signs of infections or ulcers noted.  Callus sub 5th met left foot.  Plantar heel callus  B/L.  Onychomycosis  B/L  Callus  B/L.  Debridement of nails  B/L.   Debridement of callus  Left foot.Marland Kitchen RTC 3 months    Gardiner Barefoot DPM

## 2019-10-25 DIAGNOSIS — Z8669 Personal history of other diseases of the nervous system and sense organs: Secondary | ICD-10-CM | POA: Insufficient documentation

## 2019-11-14 ENCOUNTER — Ambulatory Visit
Admission: RE | Admit: 2019-11-14 | Discharge: 2019-11-14 | Disposition: A | Discharge status: Home / Self Care | Payer: Medicare PPO | Source: Ambulatory Visit | Admitting: Vascular Surgery

## 2019-11-14 ENCOUNTER — Other Ambulatory Visit (INDEPENDENT_AMBULATORY_CARE_PROVIDER_SITE_OTHER): Payer: Self-pay | Admitting: Nurse Practitioner

## 2019-11-14 ENCOUNTER — Encounter: Payer: Self-pay | Admitting: Vascular Surgery

## 2019-11-14 ENCOUNTER — Encounter
Admission: RE | Disposition: A | Discharge status: Home / Self Care | Payer: Self-pay | Source: Home / Self Care | Attending: Vascular Surgery

## 2019-11-14 ENCOUNTER — Other Ambulatory Visit: Payer: Self-pay

## 2019-11-14 ENCOUNTER — Ambulatory Visit
Admission: RE | Admit: 2019-11-14 | Discharge: 2019-11-14 | Disposition: A | Discharge status: Home / Self Care | Payer: Medicare PPO | Attending: Vascular Surgery | Admitting: Vascular Surgery

## 2019-11-14 DIAGNOSIS — J45909 Unspecified asthma, uncomplicated: Secondary | ICD-10-CM | POA: Insufficient documentation

## 2019-11-14 DIAGNOSIS — Z885 Allergy status to narcotic agent status: Secondary | ICD-10-CM | POA: Insufficient documentation

## 2019-11-14 DIAGNOSIS — Z881 Allergy status to other antibiotic agents status: Secondary | ICD-10-CM | POA: Insufficient documentation

## 2019-11-14 DIAGNOSIS — Z8249 Family history of ischemic heart disease and other diseases of the circulatory system: Secondary | ICD-10-CM | POA: Diagnosis not present

## 2019-11-14 DIAGNOSIS — Z992 Dependence on renal dialysis: Secondary | ICD-10-CM | POA: Diagnosis not present

## 2019-11-14 DIAGNOSIS — Z20828 Contact with and (suspected) exposure to other viral communicable diseases: Secondary | ICD-10-CM | POA: Insufficient documentation

## 2019-11-14 DIAGNOSIS — K219 Gastro-esophageal reflux disease without esophagitis: Secondary | ICD-10-CM | POA: Insufficient documentation

## 2019-11-14 DIAGNOSIS — I2510 Atherosclerotic heart disease of native coronary artery without angina pectoris: Secondary | ICD-10-CM | POA: Diagnosis not present

## 2019-11-14 DIAGNOSIS — I120 Hypertensive chronic kidney disease with stage 5 chronic kidney disease or end stage renal disease: Secondary | ICD-10-CM | POA: Insufficient documentation

## 2019-11-14 DIAGNOSIS — T82898A Other specified complication of vascular prosthetic devices, implants and grafts, initial encounter: Secondary | ICD-10-CM | POA: Diagnosis not present

## 2019-11-14 DIAGNOSIS — M1990 Unspecified osteoarthritis, unspecified site: Secondary | ICD-10-CM | POA: Diagnosis not present

## 2019-11-14 DIAGNOSIS — N186 End stage renal disease: Secondary | ICD-10-CM | POA: Diagnosis not present

## 2019-11-14 DIAGNOSIS — Z87891 Personal history of nicotine dependence: Secondary | ICD-10-CM | POA: Diagnosis not present

## 2019-11-14 DIAGNOSIS — Y841 Kidney dialysis as the cause of abnormal reaction of the patient, or of later complication, without mention of misadventure at the time of the procedure: Secondary | ICD-10-CM | POA: Insufficient documentation

## 2019-11-14 DIAGNOSIS — T82868A Thrombosis of vascular prosthetic devices, implants and grafts, initial encounter: Secondary | ICD-10-CM | POA: Insufficient documentation

## 2019-11-14 HISTORY — PX: A/V SHUNT INTERVENTION: CATH118220

## 2019-11-14 HISTORY — PX: PERIPHERAL VASCULAR THROMBECTOMY: CATH118306

## 2019-11-14 LAB — RESPIRATORY PANEL BY RT PCR (FLU A&B, COVID)
Influenza A by PCR: NEGATIVE
Influenza B by PCR: NEGATIVE
SARS Coronavirus 2 by RT PCR: NEGATIVE

## 2019-11-14 LAB — POTASSIUM (ARMC VASCULAR LAB ONLY): Potassium (ARMC vascular lab): 5 (ref 3.5–5.1)

## 2019-11-14 SURGERY — PERIPHERAL VASCULAR THROMBECTOMY
Anesthesia: Moderate Sedation

## 2019-11-14 MED ORDER — FAMOTIDINE 20 MG PO TABS: 40.0000 mg | ORAL_TABLET | Freq: Once | ORAL | Status: DC | PRN

## 2019-11-14 MED ORDER — ATROPINE SULFATE 1 MG/10ML IJ SOSY
PREFILLED_SYRINGE | INTRAMUSCULAR | Status: DC | PRN
  Administered 2019-11-14: 0.5 mg via INTRAVENOUS

## 2019-11-14 MED ORDER — FENTANYL CITRATE (PF) 100 MCG/2ML IJ SOLN
INTRAMUSCULAR | Status: DC | PRN
  Administered 2019-11-14: 25 ug via INTRAVENOUS
  Administered 2019-11-14: 50 ug via INTRAVENOUS

## 2019-11-14 MED ORDER — SODIUM CHLORIDE 0.9 % IV SOLN
INTRAVENOUS | Status: DC
  Administered 2019-11-14: 1000 mL via INTRAVENOUS

## 2019-11-14 MED ORDER — ATROPINE SULFATE 1 MG/10ML IJ SOSY
PREFILLED_SYRINGE | INTRAMUSCULAR | Status: AC
  Filled 2019-11-14: qty 10

## 2019-11-14 MED ORDER — HYDROMORPHONE HCL 1 MG/ML IJ SOLN: 1.0000 mg | Freq: Once | INTRAMUSCULAR | Status: DC | PRN

## 2019-11-14 MED ORDER — ONDANSETRON HCL 4 MG/2ML IJ SOLN: 4.0000 mg | Freq: Four times a day (QID) | INTRAMUSCULAR | Status: DC | PRN

## 2019-11-14 MED ORDER — HEPARIN SODIUM (PORCINE) 1000 UNIT/ML IJ SOLN
INTRAMUSCULAR | Status: AC
  Filled 2019-11-14: qty 1

## 2019-11-14 MED ORDER — CEFAZOLIN SODIUM-DEXTROSE 1-4 GM/50ML-% IV SOLN
1.0000 g | Freq: Once | INTRAVENOUS | Status: AC
  Administered 2019-11-14: 1 g via INTRAVENOUS

## 2019-11-14 MED ORDER — METHYLPREDNISOLONE SODIUM SUCC 125 MG IJ SOLR: 125.0000 mg | Freq: Once | INTRAMUSCULAR | Status: DC | PRN

## 2019-11-14 MED ORDER — MIDAZOLAM HCL 2 MG/2ML IJ SOLN
INTRAMUSCULAR | Status: DC | PRN
  Administered 2019-11-14: 2 mg via INTRAVENOUS
  Administered 2019-11-14: 1 mg via INTRAVENOUS

## 2019-11-14 MED ORDER — FENTANYL CITRATE (PF) 100 MCG/2ML IJ SOLN
INTRAMUSCULAR | Status: AC
  Filled 2019-11-14: qty 2

## 2019-11-14 MED ORDER — IODIXANOL 320 MG/ML IV SOLN
INTRAVENOUS | Status: DC | PRN
  Administered 2019-11-14: 55 mL via INTRA_ARTERIAL

## 2019-11-14 MED ORDER — DIPHENHYDRAMINE HCL 50 MG/ML IJ SOLN: 50.0000 mg | Freq: Once | INTRAMUSCULAR | Status: DC | PRN

## 2019-11-14 MED ORDER — MIDAZOLAM HCL 5 MG/5ML IJ SOLN
INTRAMUSCULAR | Status: AC
  Filled 2019-11-14: qty 5

## 2019-11-14 MED ORDER — HEPARIN SODIUM (PORCINE) 1000 UNIT/ML IJ SOLN
INTRAMUSCULAR | Status: DC | PRN
  Administered 2019-11-14: 4000 [IU] via INTRAVENOUS

## 2019-11-14 MED ORDER — ALTEPLASE 2 MG IJ SOLR
INTRAMUSCULAR | Status: DC | PRN
  Administered 2019-11-14: 4 mg

## 2019-11-14 MED ORDER — MIDAZOLAM HCL 2 MG/ML PO SYRP: 8.0000 mg | ORAL_SOLUTION | Freq: Once | ORAL | Status: DC | PRN

## 2019-11-14 SURGICAL SUPPLY — 13 items
BALLN DORADO 8X100X80 (BALLOONS) ×4
BALLN ULTRVRSE 7X200X75 (BALLOONS) ×4
BALLOON DORADO 8X100X80 (BALLOONS) ×2 IMPLANT
BALLOON ULTRVRSE 7X200X75 (BALLOONS) ×2 IMPLANT
CANNULA 5F STIFF (CANNULA) ×4 IMPLANT
CATH EMBOLECTOMY 5FR (BALLOONS) ×4 IMPLANT
DEVICE PRESTO INFLATION (MISCELLANEOUS) ×4 IMPLANT
DRAPE BRACHIAL (DRAPES) ×4 IMPLANT
PACK ANGIOGRAPHY (CUSTOM PROCEDURE TRAY) ×4 IMPLANT
SET AVX THROMB ULT (MISCELLANEOUS) ×4 IMPLANT
SHEATH BRITE TIP 6FRX5.5 (SHEATH) ×8 IMPLANT
SUT MNCRL AB 4-0 PS2 18 (SUTURE) ×4 IMPLANT
WIRE MAGIC TOR.035 180C (WIRE) ×8 IMPLANT

## 2019-11-14 NOTE — H&P (Signed)
[facility] Admission History & Physical  MRN : [id number]  Jeremy Rose is a 63 y.o. (04-23-1956) male who presents with chief complaint of No chief complaint on file. Marland Kitchen  History of Present Illness: I am asked to evaluate the patient by the dialysis center. The patient was sent here because they were unable to cannulate the left arm AVF this morning. Furthermore the Center states there is no thrill or bruit. The patient states this is the first dialysis run to be missed. This problem is acute in onset and has been present for approximately 2 days. The patient is unaware of any other change.  Patient denies pain or tenderness overlying the access.  There is no pain with dialysis.  The patient denies hand pain or finger pain consistent with steal syndrome.   There have been past interventions or declots of this access.  The patient is not chronically hypotensive on dialysis.  Current Facility-Administered Medications  Medication Dose Route Frequency Provider Last Rate Last Admin  . fentaNYL (SUBLIMAZE) 100 MCG/2ML injection           . heparin 1000 UNIT/ML injection           . midazolam (VERSED) 5 MG/5ML injection           . 0.9 %  sodium chloride infusion   Intravenous Continuous Kris Hartmann, NP 10 mL/hr at 11/14/19 1444 1,000 mL at 11/14/19 1444  . ceFAZolin (ANCEF) IVPB 1 g/50 mL premix  1 g Intravenous Once Kris Hartmann, NP 100 mL/hr at 11/14/19 1502 1 g at 11/14/19 1502  . diphenhydrAMINE (BENADRYL) injection 50 mg  50 mg Intravenous Once PRN Kris Hartmann, NP      . famotidine (PEPCID) tablet 40 mg  40 mg Oral Once PRN Kris Hartmann, NP      . HYDROmorphone (DILAUDID) injection 1 mg  1 mg Intravenous Once PRN [name] E, NP      . methylPREDNISolone sodium succinate (SOLU-MEDROL) 125 mg/2 mL injection 125 mg  125 mg Intravenous Once PRN [name] E, NP      . midazolam (VERSED) 2 MG/ML syrup 8 mg  8 mg Oral Once PRN Kris Hartmann, NP      . ondansetron (ZOFRAN) injection 4 mg  4 mg Intravenous Q6H PRN Kris Hartmann, NP        Past Medical History:  Diagnosis Date  . Arthritis    hands  . Asthma    uses inhaler  . Coronary artery disease   . Dialysis patient [place]) 2012   Monday, Wednesday, Friday  . GERD (gastroesophageal reflux disease)   . Gout of right hand   . Hypertension   . Renal disorder    stage V..on dialysis  . Seizures ([place]) 2019   last seizure 5 years ago. had 20 in one day    Past Surgical History:  Procedure Laterality Date  . A/V FISTULAGRAM Left 02/24/2017   Procedure: A/V Fistulagram;  Surgeon: Katha Cabal, MD;  Location: [place] [facility];  Service: Cardiovascular;  Laterality: Left;  . A/V FISTULAGRAM Left 07/20/2019   Procedure: A/V FISTULAGRAM;  Surgeon: Algernon Huxley, MD;  Location: [place] [facility];  Service: Cardiovascular;  Laterality: Left;  . A/V SHUNT INTERVENTION N/A 02/24/2017   Procedure: A/V Shunt Intervention;  Surgeon: Katha Cabal, MD;  Location: [place] [facility];  Service: Cardiovascular;  Laterality: N/A;  . AV FISTULA  PLACEMENT Left   . AV FISTULA PLACEMENT Left 12/02/2017   Procedure: ARTERIOVENOUS (AV) FISTULA CREATION ( BRACHIAL CEPHALIC );  Surgeon: Katha Cabal, MD;  Location: [facility] ORS;  Service: Vascular;  Laterality: Left;  . CARDIAC CATHETERIZATION  2012   no stents  . COLONOSCOPY Left 09/05/2018   Procedure: COLONOSCOPY;  Surgeon: Virgel Manifold, MD;  Location: [facility] ENDOSCOPY;  Service: Endoscopy;  Laterality: Left;  . DIALYSIS/PERMA CATHETER REMOVAL N/A 08/24/2018   Procedure: DIALYSIS/PERMA CATHETER REMOVAL;  Surgeon: Katha Cabal, MD;  Location: [place] [facility];  Service: Cardiovascular;  Laterality: N/A;  . ESOPHAGOGASTRODUODENOSCOPY Left 09/05/2018   Procedure: ESOPHAGOGASTRODUODENOSCOPY (EGD);  Surgeon: Virgel Manifold, MD;  Location: [facility] ENDOSCOPY;  Service: Endoscopy;  Laterality: Left;  .  ESOPHAGOGASTRODUODENOSCOPY (EGD) WITH PROPOFOL N/A 08/31/2018   Procedure: ESOPHAGOGASTRODUODENOSCOPY (EGD) WITH PROPOFOL;  Surgeon: Lucilla Lame, MD;  Location: [facility] ENDOSCOPY;  Service: Endoscopy;  Laterality: N/A;  . GIVENS CAPSULE STUDY N/A 09/07/2018   Procedure: GIVENS CAPSULE STUDY;  Surgeon: Jonathon Bellows, MD;  Location: [facility] ENDOSCOPY;  Service: Gastroenterology;  Laterality: N/A;  . INSERTION OF DIALYSIS CATHETER Left 05/28/2018   Procedure: INSERTION OF DIALYSIS CATHETER ( TUNNELED CATH INSERT );  Surgeon: Katha Cabal, MD;  Location: [facility] ORS;  Service: Vascular;  Laterality: Left;  . LIGATION OF ARTERIOVENOUS  FISTULA Left 12/02/2017   Procedure: LIGATION OF ARTERIOVENOUS  FISTULA ( REMOVAL LEFT WRIST FISTULA );  Surgeon: Katha Cabal, MD;  Location: [facility]S;  Service: Vascular;  Laterality: Left;  . PERIPHERAL VASCULAR CATHETERIZATION Left 06/03/2016   Procedure: A/V Shuntogram/Fistulagram;  Surgeon: Katha Cabal, MD;  Location: [place] [facility];  Service: Cardiovascular;  Laterality: Left;  . PERIPHERAL VASCULAR CATHETERIZATION  06/03/2016   Procedure: Peripheral Vascular Balloon Angioplasty;  Surgeon: Katha Cabal, MD;  Location: [place] [facility];  Service: Cardiovascular;;  . REVISON OF ARTERIOVENOUS FISTULA Left 05/28/2018   Procedure: REVISON OF ARTERIOVENOUS FISTULA ( REMOVE ANEURYSM );  Surgeon: Katha Cabal, MD;  Location: [facility] ORS;  Service: Vascular;  Laterality: Left;  . UPPER EXTREMITY VENOGRAPHY N/A 04/13/2018   Procedure: UPPER EXTREMITY VENOGRAPHY;  Surgeon: Katha Cabal, MD;  Location: [place] [facility];  Service: Cardiovascular;  Laterality: N/A;     Social History   Tobacco Use  . Smoking status: Former Smoker    Types: Cigarettes  . Smokeless tobacco: Former Systems developer    Quit date: 2010  . Tobacco comment: did not inhale!!  Substance Use Topics  . Alcohol use: No  . Drug use: No     Family History  Problem Relation  Age of Onset  . Heart disease Mother   . Kidney disease Paternal Uncle     No family history of bleeding or clotting disorders, autoimmune disease or porphyria  Allergies  Allergen Reactions  . Dextromethorphan-Guaifenesin Nausea And Vomiting  . Codeine Nausea And Vomiting  . Doxycycline Nausea And Vomiting     REVIEW OF SYSTEMS (Negative unless checked)  Constitutional: [] Weight loss  [] Fever  [] Chills Cardiac: [] Chest pain   [] Chest pressure   [] Palpitations   [] Shortness of breath when laying flat   [] Shortness of breath at rest   [x] Shortness of breath with exertion. Vascular:  [] Pain in legs with walking   [] Pain in legs at rest   [] Pain in legs when laying flat   [] Claudication   [] Pain in feet when walking  [] Pain in feet at rest  [] Pain in feet when laying  flat   [] History of DVT   [] Phlebitis   [] Swelling in legs   [] Varicose veins   [] Non-healing ulcers Pulmonary:   [] Uses home oxygen   [] Productive cough   [] Hemoptysis   [] Wheeze  [] COPD   [x] Asthma Neurologic:  [] Dizziness  [] Blackouts   [x] Seizures   [] History of stroke   [] History of TIA  [] Aphasia   [] Temporary blindness   [] Dysphagia   [] Weakness or numbness in arms   [] Weakness or numbness in legs Musculoskeletal:  [x] Arthritis   [] Joint swelling   [x] Joint pain   [] Low back pain Hematologic:  [] Easy bruising  [] Easy bleeding   [] Hypercoagulable state   [] Anemic  [] Hepatitis Gastrointestinal:  [] Blood in stool   [] Vomiting blood  [x] Gastroesophageal reflux/heartburn   [] Difficulty swallowing. Genitourinary:  [x] Chronic kidney disease   [] Difficult urination  [] Frequent urination  [] Burning with urination   [] Blood in urine Skin:  [] Rashes   [] Ulcers   [] Wounds Psychological:  [] History of anxiety   []  History of major depression.  Physical Examination  Vitals:   11/14/19 1433 11/14/19 1502  BP: (!) 160/86 (!) 151/99  Pulse: (!) 54 (!) 58  Resp: 10 (!) 22  Temp: 98.5 F (36.9 C)   TempSrc: Oral   SpO2: 98% 100%   Weight: 74.8 kg   Height: 5\' 8"  (1.727 m)    Body mass index is 25.09 kg/m. Gen: WD/WN, NAD Head: [place]/AT, No temporalis wasting.  Ear/Nose/Throat: Hearing grossly intact, nares w/o erythema or drainage, oropharynx w/o Erythema/Exudate,  Eyes: Conjunctiva clear, sclera non-icteric Neck: Trachea midline.  No JVD.  Pulmonary:  Good air movement, respirations not labored, no use of accessory muscles.  Cardiac: RRR, normal S1, S2. Vascular: no thrill in left arm AVF Vessel Right Left  Radial Palpable Palpable   Musculoskeletal: M/S 5/5 throughout.  Extremities without ischemic changes.  No deformity or atrophy.  Neurologic: Sensation grossly intact in extremities.  Symmetrical.  Speech is fluent. Motor exam as listed above. Psychiatric: Judgment intact, Mood & affect appropriate for pt's clinical situation. Dermatologic: No rashes or ulcers noted.  No cellulitis or open wounds.    CBC Lab Results  Component Value Date   WBC 9.1 09/29/2018   HGB 8.6 (L) 09/29/2018   HCT 26.6 (L) 09/29/2018   MCV 95.7 09/29/2018   PLT 229 09/29/2018    BMET    Component Value Date/Time   NA 139 09/29/2018 0404   NA 138 05/18/2014 2142   K 4.1 09/29/2018 0404   K 5.0 06/28/2014 1254   CL 96 (L) 09/29/2018 0404   CL 98 05/18/2014 2142   CO2 31 09/29/2018 0404   CO2 33 (H) 05/18/2014 2142   GLUCOSE 137 (H) 09/29/2018 0404   GLUCOSE 88 05/18/2014 2142   BUN 35 (H) 09/29/2018 0404   BUN 12 05/18/2014 2142   CREATININE 10.81 (H) 09/29/2018 0404   CREATININE 5.73 (H) 05/18/2014 2142   CALCIUM 8.5 (L) 09/29/2018 0404   CALCIUM 8.3 (L) 05/18/2014 2142   GFRNONAA 4 (L) 09/29/2018 0404   GFRNONAA 10 (L) 05/18/2014 2142   GFRAA 5 (L) 09/29/2018 0404   GFRAA 12 (L) 05/18/2014 2142   CrCl cannot be calculated (Patient's most recent lab result is older than the maximum 21 days allowed.).  COAG Lab Results  Component Value Date   INR 0.94 05/18/2018   INR 0.94 11/24/2017    Radiology No  results found.  Assessment/Plan 1.  Complication dialysis device with thrombosis AV access:  Patient's left  arm dialysis access is thrombosed. The patient will undergo thrombectomy using interventional techniques.  The risks and benefits were described to the patient.  All questions were answered.  The patient agrees to proceed with angiography and intervention. Potassium will be drawn to ensure that it is an appropriate level prior to performing thrombectomy. 2.  End-stage renal disease requiring hemodialysis:  Patient will continue dialysis therapy without further interruption if a successful thrombectomy is not achieved then catheter will be placed. Dialysis has already been arranged since the patient missed their previous session 3.  Hypertension:  Patient will continue medical management; nephrology is following no changes in oral medications.     [name], MD  11/14/2019 3:03 PM

## 2019-11-14 NOTE — Op Note (Signed)
[facility]    OPERATIVE NOTE   PROCEDURE: 1.  Left brachiocephalic arteriovenous fistula and Artegraft jump graft cannulation under ultrasound guidance in both a retrograde and then antegrade fashion crossing 2.  Left arm fistulagram and central venogram 3.  Catheter directed thrombolysis with 4 mg of TPA delivered with the AngioJet AVX catheter 4.  Mechanical rheolytic thrombectomy to the left brachiocephalic AV fistula and Artegraft jump graft with the AngioJet AVX catheter 5.  Fogarty embolectomy for residual arterial plug 6.  Percutaneous transluminal angioplasty of the Artegraft jump graft in the upper arm cephalic vein with 7 and 8 mm diameter angioplasty balloon  PRE-OPERATIVE DIAGNOSIS: 1. ESRD 2.  Thrombosed Artegraft jump graft and left brachiocephalic arteriovenous fistula  POST-OPERATIVE DIAGNOSIS: same as above   SURGEON: [name], MD  ANESTHESIA: local with Moderate Conscious Sedation for 50 minutes using 3 mg of Versed and 75 mcg of Fentanyl  ESTIMATED BLOOD LOSS: 25 cc  FINDING(S): 1. Thrombosed graft with some stenosis of greater than 70% at the graft to cephalic vein anastomosis in the proximal to mid upper arm.  Central venous circulation was patent.  Native brachial artery to cephalic vein anastomosis was patent.  There was thrombus at the jump graft anastomosis to the cephalic vein near the antecubital fossa with some degree of stenosis there as well although difficult to discern how much.  SPECIMEN(S):  None  CONTRAST: 55 cc  FLUORO TIME: 7.1 minutes  INDICATIONS: Patient is a 63 y.o.male who presents with a thrombosed left Artegraft jump graft and brachiocephalic arteriovenous fistula.  The patient is scheduled for an attempted declot and shuntogram.  The patient is aware the risks include but are not limited to: bleeding, infection, thrombosis of the cannulated access, and possible anaphylactic reaction to the contrast.  The patient  is aware of the risks of the procedure and elects to proceed forward.  DESCRIPTION: After full informed written consent was obtained, the patient was brought back to the angiography suite and placed supine upon the angiography table.  The patient was connected to monitoring equipment. Moderate conscious sedation was administered during a face to face encounter with the patient throughout the procedure with my supervision of the RN administering medicines and monitoring the patient's vital signs, pulse oximetry, telemetry and mental status throughout from the start of the procedure until the patient was taken to the recovery room. The left arm was prepped and draped in the standard fashion for a percutaneous access intervention.  Under ultrasound guidance, the left Artegraft jump graft and brachiocephalic arteriovenous fistula was cannulated with a micropuncture needle under direct ultrasound guidance due to the pulseless nature of the fistula in both an antegrade and a retrograde fashion crossing, and permanent images were performed.  The microwire was advanced and the needle was exchanged for the a microsheath.  I then upsized to a 6 Fr Sheath and imaging was performed.  Hand injections were completed to image the access including the central venous system. This demonstrated thrombosis of the AVF and Artegraft jump graft.  Based on the images, this patient will need extensive treatment to salvage the graft. I then gave the patient 4000 units of intravenous heparin.  I then placed a Magic torque wire into the brachial artery from the retrograde sheath and into the subclavian vein from the antegrade sheath. 4 mg of TPA were deployed throughout the entirety of the fistula, the Artegraft jump graft, and into the proximal upper arm cephalic vein. This  was allowed to dwell for 10-15 minutes. Mechanical rheolytic thrombectomy was then performed throughout the fistula and into the proximal upper arm cephalic vein.  This uncovered at least a 14 to 75% stenosis of the anastomosis of the Artegraft to the cephalic vein in the proximal to mid upper arm.  There is some degree of stenosis at the anastomosis of the Artegraft cephalic vein near the antecubital fossa although it was difficult to discern how bad this was with the thrombus around.  A residual arterial plug was also seen at the arterial anastomosis. An attempt to clear the arterial plug was done with 3 passes of the Fogarty embolectomy balloon. This resulted in resolution of the arterial plug, and clearance of the arterial side of the graft. The arterial outflow was seen to be intact through the radial and ulnar arteries as well on these images. The retrograde sheath was removed. I then turned my attention to the thrombus in the fistula and the cephalic vein. Mechanical rheolytic thrombectomy was performed. This resulted in mild improvement but stenosis remained.  I then elected to treat this with a 7 mm diameter angioplasty balloon within the Artegraft jump graft and up into the cephalic vein in the proximal upper arm this was inflated to 16 atm but some stenosis remained so upsized to an 8 mm diameter high-pressure angioplasty balloon and inflated this to 28 atm for 1 minute.  Completion imaging showed less than 20 to 25% residual stenosis and now with brisk flow through the graft and excellent thrill.    Based on the completion imaging, no further intervention is necessary.  The wire and balloon were removed from the sheath.  A 4-0 Monocryl purse-string suture was sewn around the sheath.  The sheath was removed while tying down the suture.  A sterile bandage was applied to the puncture site.  COMPLICATIONS: None  CONDITION: Stable   [name] 11/14/2019 4:01 PM   This note was created with Dragon Medical transcription system. Any errors in dictation are purely unintentional.

## 2019-11-15 ENCOUNTER — Encounter: Payer: Self-pay | Admitting: Cardiology

## 2019-12-01 DIAGNOSIS — R569 Unspecified convulsions: Secondary | ICD-10-CM | POA: Insufficient documentation

## 2019-12-12 ENCOUNTER — Other Ambulatory Visit (INDEPENDENT_AMBULATORY_CARE_PROVIDER_SITE_OTHER): Payer: Self-pay | Admitting: Vascular Surgery

## 2019-12-12 DIAGNOSIS — Z9862 Peripheral vascular angioplasty status: Secondary | ICD-10-CM

## 2019-12-12 DIAGNOSIS — N186 End stage renal disease: Secondary | ICD-10-CM

## 2019-12-13 ENCOUNTER — Encounter (INDEPENDENT_AMBULATORY_CARE_PROVIDER_SITE_OTHER): Payer: Self-pay | Admitting: Nurse Practitioner

## 2019-12-13 ENCOUNTER — Other Ambulatory Visit: Payer: Self-pay

## 2019-12-13 ENCOUNTER — Ambulatory Visit (INDEPENDENT_AMBULATORY_CARE_PROVIDER_SITE_OTHER): Payer: Medicare PPO | Admitting: Nurse Practitioner

## 2019-12-13 ENCOUNTER — Ambulatory Visit (INDEPENDENT_AMBULATORY_CARE_PROVIDER_SITE_OTHER): Payer: Medicare PPO

## 2019-12-13 VITALS — BP 107/71 | HR 75 | Resp 16 | Wt 153.0 lb

## 2019-12-13 DIAGNOSIS — I10 Essential (primary) hypertension: Secondary | ICD-10-CM

## 2019-12-13 DIAGNOSIS — Z9862 Peripheral vascular angioplasty status: Secondary | ICD-10-CM | POA: Diagnosis not present

## 2019-12-13 DIAGNOSIS — N186 End stage renal disease: Secondary | ICD-10-CM

## 2019-12-13 DIAGNOSIS — Z992 Dependence on renal dialysis: Secondary | ICD-10-CM | POA: Diagnosis not present

## 2019-12-15 ENCOUNTER — Other Ambulatory Visit: Payer: Self-pay

## 2019-12-15 ENCOUNTER — Encounter: Payer: Self-pay | Admitting: Podiatry

## 2019-12-15 ENCOUNTER — Ambulatory Visit (INDEPENDENT_AMBULATORY_CARE_PROVIDER_SITE_OTHER): Payer: Medicare PPO | Admitting: Podiatry

## 2019-12-15 ENCOUNTER — Encounter (INDEPENDENT_AMBULATORY_CARE_PROVIDER_SITE_OTHER): Payer: Self-pay | Admitting: Nurse Practitioner

## 2019-12-15 DIAGNOSIS — B351 Tinea unguium: Secondary | ICD-10-CM | POA: Diagnosis not present

## 2019-12-15 DIAGNOSIS — L84 Corns and callosities: Secondary | ICD-10-CM

## 2019-12-15 DIAGNOSIS — M79676 Pain in unspecified toe(s): Secondary | ICD-10-CM | POA: Diagnosis not present

## 2019-12-15 NOTE — Progress Notes (Signed)
SUBJECTIVE:  Patient ID: Jeremy Rose, male    DOB: [birth year], 64 y.o.   MRN: [id number] Chief Complaint  Patient presents with  . Follow-up    [facility] 4 week post fistula    HPI  Jeremy Rose is a 64 y.o. male The patient returns to the office for followup status post intervention of the dialysis access left brachial cephalic ABF. Following the intervention the access function has significantly improved, with better flow rates and improved KT/V. The patient has not been experiencing increased bleeding times following decannulation and the patient denies increased recirculation. The patient denies an increase in arm swelling. At the present time the patient denies hand pain.  The patient denies amaurosis fugax or recent TIA symptoms. There are no recent neurological changes noted. The patient denies claudication symptoms or rest pain symptoms. The patient denies history of DVT, PE or superficial thrombophlebitis. The patient denies recent episodes of angina or shortness of breath.   Today the patient has a flow volume of 2000.  There is a increase in velocity at the mid to distal upper arm at 523 cm.  The graft is widely patent status post thrombectomy there is a mild narrowing and a moderate velocity increase.     Past Medical History:  Diagnosis Date  . Arthritis    hands  . Asthma    uses inhaler  . Coronary artery disease   . Dialysis patient [facility]) 2012   Monday, Wednesday, Friday  . GERD (gastroesophageal reflux disease)   . Gout of right hand   . Hypertension   . Renal disorder    stage V..on dialysis  . Seizures ([place]) 2019   last seizure 5 years ago. had 20 in one day    Past Surgical History:  Procedure Laterality Date  . A/V FISTULAGRAM Left 02/24/2017   Procedure: A/V Fistulagram;  Surgeon: Katha Cabal, MD;  Location: [place] [facility];  Service: Cardiovascular;  Laterality: Left;  . A/V FISTULAGRAM Left 07/20/2019   Procedure: A/V  FISTULAGRAM;  Surgeon: Algernon Huxley, MD;  Location: [place] [facility];  Service: Cardiovascular;  Laterality: Left;  . A/V SHUNT INTERVENTION N/A 02/24/2017   Procedure: A/V Shunt Intervention;  Surgeon: Katha Cabal, MD;  Location: [place] [facility];  Service: Cardiovascular;  Laterality: N/A;  . A/V SHUNT INTERVENTION N/A 11/14/2019   Procedure: A/V SHUNT INTERVENTION;  Surgeon: Algernon Huxley, MD;  Location: [place] [facility];  Service: Cardiovascular;  Laterality: N/A;  . AV FISTULA PLACEMENT Left   . AV FISTULA PLACEMENT Left 12/02/2017   Procedure: ARTERIOVENOUS (AV) FISTULA CREATION ( BRACHIAL CEPHALIC );  Surgeon: Katha Cabal, MD;  Location: [facility];  Service: Vascular;  Laterality: Left;  . CARDIAC CATHETERIZATION  2012   no stents  . COLONOSCOPY Left 09/05/2018   Procedure: COLONOSCOPY;  Surgeon: Virgel Manifold, MD;  Location: [facility] ENDOSCOPY;  Service: Endoscopy;  Laterality: Left;  . DIALYSIS/PERMA CATHETER REMOVAL N/A 08/24/2018   Procedure: DIALYSIS/PERMA CATHETER REMOVAL;  Surgeon: Katha Cabal, MD;  Location: [place] [facility];  Service: Cardiovascular;  Laterality: N/A;  . ESOPHAGOGASTRODUODENOSCOPY Left 09/05/2018   Procedure: ESOPHAGOGASTRODUODENOSCOPY (EGD);  Surgeon: Virgel Manifold, MD;  Location: [facility] ENDOSCOPY;  Service: Endoscopy;  Laterality: Left;  . ESOPHAGOGASTRODUODENOSCOPY (EGD) WITH PROPOFOL N/A 08/31/2018   Procedure: ESOPHAGOGASTRODUODENOSCOPY (EGD) WITH PROPOFOL;  Surgeon: Lucilla Lame, MD;  Location: [facility] ENDOSCOPY;  Service: Endoscopy;  Laterality: N/A;  . GIVENS CAPSULE STUDY N/A  09/07/2018   Procedure: GIVENS CAPSULE STUDY;  Surgeon: Jonathon Bellows, MD;  Location: [facility] ENDOSCOPY;  Service: Gastroenterology;  Laterality: N/A;  . INSERTION OF DIALYSIS CATHETER Left 05/28/2018   Procedure: INSERTION OF DIALYSIS CATHETER ( TUNNELED CATH INSERT );  Surgeon: Katha Cabal, MD;  Location: [facility]S;  Service: Vascular;   Laterality: Left;  . LIGATION OF ARTERIOVENOUS  FISTULA Left 12/02/2017   Procedure: LIGATION OF ARTERIOVENOUS  FISTULA ( REMOVAL LEFT WRIST FISTULA );  Surgeon: Katha Cabal, MD;  Location: [facility] OR[facility];  Service: Vascular;  Laterality: Left;  . PERIPHERAL VASCULAR CATHETERIZATION Left 06/03/2016   Procedure: A/V Shuntogram/Fistulagram;  Surgeon: Katha Cabal, MD;  Location: [place] [facility];  Service: Cardiovascular;  Laterality: Left;  . PERIPHERAL VASCULAR CATHETERIZATION  06/03/2016   Procedure: Peripheral Vascular Balloon Angioplasty;  Surgeon: Katha Cabal, MD;  Location: [place] [facility];  Service: Cardiovascular;;  . PERIPHERAL VASCULAR THROMBECTOMY Left 11/14/2019   Procedure: PERIPHERAL VASCULAR THROMBECTOMY;  Surgeon: Algernon Huxley, MD;  Location: [place] [facility];  Service: Cardiovascular;  Laterality: Left;  . REVISON OF ARTERIOVENOUS FISTULA Left 05/28/2018   Procedure: REVISON OF ARTERIOVENOUS FISTULA ( REMOVE ANEURYSM );  Surgeon: Katha Cabal, MD;  Location: [facility];  Service: Vascular;  Laterality: Left;  . UPPER EXTREMITY VENOGRAPHY N/A 04/13/2018   Procedure: UPPER EXTREMITY VENOGRAPHY;  Surgeon: Katha Cabal, MD;  Location: [place] [facility];  Service: Cardiovascular;  Laterality: N/A;    Social History   Socioeconomic History  . Marital status: Single    Spouse name: Not on file  . Number of children: Not on file  . Years of education: Not on file  . Highest education level: Not on file  Occupational History  . Not on file  Tobacco Use  . Smoking status: Former Smoker    Types: Cigarettes  . Smokeless tobacco: Former Systems developer    Quit date: 2010  . Tobacco comment: did not inhale!!  Substance and Sexual Activity  . Alcohol use: No  . Drug use: No  . Sexual activity: Not Currently  Other Topics Concern  . Not on file  Social History Narrative  . Not on file   Social Determinants of Health   Financial Resource Strain:   .  Difficulty of Paying Living Expenses: Not on file  Food Insecurity:   . Worried About Charity fundraiser in the Last Year: Not on file  . Ran Out of Food in the Last Year: Not on file  Transportation Needs:   . Lack of Transportation (Medical): Not on file  . Lack of Transportation (Non-Medical): Not on file  Physical Activity:   . Days of Exercise per Week: Not on file  . Minutes of Exercise per Session: Not on file  Stress:   . Feeling of Stress : Not on file  Social Connections:   . Frequency of Communication with Friends and Family: Not on file  . Frequency of Social Gatherings with Friends and Family: Not on file  . Attends Religious Services: Not on file  . Active Member of Clubs or Organizations: Not on file  . Attends Archivist Meetings: Not on file  . Marital Status: Not on file  Intimate Partner Violence:   . Fear of Current or Ex-Partner: Not on file  . Emotionally Abused: Not on file  . Physically Abused: Not on file  . Sexually Abused: Not on file    Family History  Problem Relation Age of Onset  . Heart disease Mother   . Kidney disease Paternal Uncle     Allergies  Allergen Reactions  . Dextromethorphan-Guaifenesin Nausea And Vomiting  . Codeine Nausea And Vomiting  . Doxycycline Nausea And Vomiting     Review of Systems   Review of Systems: Negative Unless Checked Constitutional: [] Weight loss  [] Fever  [] Chills Cardiac: [] Chest pain   []  Atrial Fibrillation  [] Palpitations   [] Shortness of breath when laying flat   [] Shortness of breath with exertion. [] Shortness of breath at rest Vascular:  [] Pain in legs with walking   [] Pain in legs with standing [] Pain in legs when laying flat   [] Claudication    [] Pain in feet when laying flat    [] History of DVT   [] Phlebitis   [] Swelling in legs   [] Varicose veins   [] Non-healing ulcers Pulmonary:   [] Uses home oxygen   [] Productive cough   [] Hemoptysis   [] Wheeze  [] COPD   [x] Asthma Neurologic:   [] Dizziness   [] Seizures  [] Blackouts [] History of stroke   [] History of TIA  [] Aphasia   [] Temporary Blindness   [] Weakness or numbness in arm   [] Weakness or numbness in leg Musculoskeletal:   [] Joint swelling   [] Joint pain   [] Low back pain  []  History of Knee Replacement [x] Arthritis [] back Surgeries  []  Spinal Stenosis    Hematologic:  [] Easy bruising  [] Easy bleeding   [] Hypercoagulable state   [] Anemic Gastrointestinal:  [] Diarrhea   [] Vomiting  [x] Gastroesophageal reflux/heartburn   [] Difficulty swallowing. [] Abdominal pain Genitourinary:  [] Chronic kidney disease   [] Difficult urination  [] Anuric   [] Blood in urine [] Frequent urination  [] Burning with urination   [] Hematuria Skin:  [] Rashes   [] Ulcers [] Wounds Psychological:  [] History of anxiety   []  History of major depression  []  Memory Difficulties      OBJECTIVE:   Physical Exam  BP 107/71 (BP Location: Right Arm)   Pulse 75   Resp 16   Wt 153 lb (69.4 kg)   BMI 23.26 kg/m   Gen: WD/WN, NAD Head: [place]/AT, No temporalis wasting.  Ear/Nose/Throat: Hearing grossly intact, nares w/o erythema or drainage Eyes: PER, EOMI, sclera nonicteric.  Neck: Supple, no masses.  No JVD.  Pulmonary:  Good air movement, no use of accessory muscles.  Cardiac: RRR Vascular: good thrill and bruit  Vessel Right Left  Radial Palpable Palpable   Gastrointestinal: soft, non-distended. No guarding/no peritoneal signs.  Musculoskeletal: M/S 5/5 throughout.  No deformity or atrophy.  Neurologic: Pain and light touch intact in extremities.  Symmetrical.  Speech is fluent. Motor exam as listed above. Psychiatric: Judgment intact, Mood & affect appropriate for pt's clinical situation. Dermatologic: No Venous rashes. No Ulcers Noted.  No changes consistent with cellulitis. Lymph : No Cervical lymphadenopathy, no lichenification or skin changes of chronic lymphedema.       ASSESSMENT AND PLAN:  1. ESRD (end stage renal disease) on dialysis  [facility]) Recommend:  While the patient does have some increased velocities, the patient is doing well and currently has adequate dialysis access. The patient's dialysis center is not reporting any access issues. Flow pattern is stable when compared to the prior ultrasound.  The patient should have a duplex ultrasound of the dialysis access in 6 months. The patient will follow-up with me in the office after each ultrasound     2. Essential hypertension Continue antihypertensive medications as already ordered, these medications have been reviewed and there are no changes at this  time.    Current Outpatient Medications on File Prior to Visit  Medication Sig Dispense Refill  . acetaminophen (TYLENOL) 500 MG tablet Take 1 tablet (500 mg total) by mouth every 6 (six) hours as needed for mild pain. 180 tablet 0  . albuterol (PROVENTIL HFA) 108 (90 Base) MCG/ACT inhaler Inhale into the lungs.    Marland Kitchen allopurinol (ZYLOPRIM) 100 MG tablet Take by mouth.    Marland Kitchen aspirin 81 MG EC tablet Take by mouth.    . beclomethasone (QVAR) 80 MCG/ACT inhaler Inhale 1 puff into the lungs as needed (for shortness of breath).    . carvedilol (COREG CR) 10 MG 24 hr capsule     . carvedilol (COREG) 25 MG tablet Take by mouth.    . cephALEXin (KEFLEX) 500 MG capsule Take by mouth.    . cinacalcet (SENSIPAR) 60 MG tablet Take 120 mg by mouth daily.     . cyanocobalamin 1000 MCG tablet Take 1,000 mcg daily by mouth.    . felodipine (PLENDIL) 5 MG 24 hr tablet Take by mouth.    . ferrous sulfate 325 (65 FE) MG tablet TAKE 1 TABLET BY MOUTH 3 TIMES A DAY FOR ANEMIA    . folic acid (FOLVITE) A999333 MCG tablet Take by mouth.    . Lacosamide (VIMPAT) 150 MG TABS Take by mouth.    . levETIRAcetam (KEPPRA XR) 500 MG 24 hr tablet Take by mouth.    Marland Kitchen omeprazole (PRILOSEC) 20 MG capsule Take 20 mg by mouth daily.    . sevelamer carbonate (RENVELA) 800 MG tablet Take 2,400 mg by mouth 3 (three) times daily with meals.      No current  facility-administered medications on file prior to visit.    There are no Patient Instructions on file for this visit. No follow-ups on file.   Kris Hartmann, NP  This note was completed with Sales executive.  Any errors are purely unintentional.

## 2019-12-15 NOTE — Progress Notes (Signed)
Complaint:  Visit Type: Patient returns to my office for continued preventative foot care services. Complaint: Patient states" my nails have grown long and thick and become painful to walk and wear shoes" Patient says his heel callus is painful.. The patient presents for preventative foot care services.  Podiatric Exam: Vascular: dorsalis pedis and posterior tibial pulses are palpable bilateral. Capillary return is immediate. Temperature gradient is WNL. Skin turgor WNL  Sensorium: Normal Semmes Weinstein monofilament test. Normal tactile sensation bilaterally. Nail Exam: Pt has thick disfigured discolored nails with subungual debris noted bilateral entire nail hallux through fifth toenails Ulcer Exam: There is no evidence of ulcer or pre-ulcerative changes or infection. Orthopedic Exam: Muscle tone and strength are WNL. No limitations in general ROM. No crepitus or effusions noted. Foot type and digits show no abnormalities. Bony prominences are unremarkable. Skin: No Porokeratosis. No infection or ulcers.  Asymptomatic callus sub 1,5  B/L.  Painful heel callus  B/L.  Diagnosis:  Onychomycosis, , Pain in right toe, pain in left toes,  Heel callus Treatment & Plan Procedures and Treatment: Consent by patient was obtained for treatment procedures.   Debridement of mycotic and hypertrophic toenails, 1 through 5 bilateral and clearing of subungual debris. No ulceration, no infection noted.  Debride heel callus with dremel tool. Return Visit-Office Procedure: Patient instructed to return to the office for a follow up visit 3 months for continued evaluation and treatment.    Gardiner Barefoot DPM

## 2020-01-16 DIAGNOSIS — Z8669 Personal history of other diseases of the nervous system and sense organs: Secondary | ICD-10-CM | POA: Insufficient documentation

## 2020-01-16 DIAGNOSIS — R490 Dysphonia: Secondary | ICD-10-CM | POA: Insufficient documentation

## 2020-02-14 ENCOUNTER — Other Ambulatory Visit (INDEPENDENT_AMBULATORY_CARE_PROVIDER_SITE_OTHER): Payer: Self-pay | Admitting: Nurse Practitioner

## 2020-02-14 DIAGNOSIS — R2232 Localized swelling, mass and lump, left upper limb: Secondary | ICD-10-CM

## 2020-02-16 ENCOUNTER — Ambulatory Visit (INDEPENDENT_AMBULATORY_CARE_PROVIDER_SITE_OTHER): Payer: Medicare PPO | Admitting: Nurse Practitioner

## 2020-02-16 ENCOUNTER — Other Ambulatory Visit: Payer: Self-pay

## 2020-02-16 ENCOUNTER — Encounter (INDEPENDENT_AMBULATORY_CARE_PROVIDER_SITE_OTHER): Payer: Self-pay | Admitting: Nurse Practitioner

## 2020-02-16 ENCOUNTER — Ambulatory Visit (INDEPENDENT_AMBULATORY_CARE_PROVIDER_SITE_OTHER): Payer: Medicare PPO

## 2020-02-16 VITALS — BP 115/65 | HR 72 | Resp 16 | Ht 68.0 in | Wt 157.0 lb

## 2020-02-16 DIAGNOSIS — I10 Essential (primary) hypertension: Secondary | ICD-10-CM

## 2020-02-16 DIAGNOSIS — Z992 Dependence on renal dialysis: Secondary | ICD-10-CM | POA: Diagnosis not present

## 2020-02-16 DIAGNOSIS — N186 End stage renal disease: Secondary | ICD-10-CM

## 2020-02-16 DIAGNOSIS — R2232 Localized swelling, mass and lump, left upper limb: Secondary | ICD-10-CM

## 2020-02-21 ENCOUNTER — Encounter (INDEPENDENT_AMBULATORY_CARE_PROVIDER_SITE_OTHER): Payer: Self-pay | Admitting: Nurse Practitioner

## 2020-02-21 NOTE — Progress Notes (Signed)
Subjective:    Patient ID: Jeremy Rose, male    DOB: 1956-02-15, 64 y.o.   MRN: [id number] Chief Complaint  Patient presents with  . Follow-up    ultrasound follow up     The patient presents today for noninvasive studies related to his left brachiocephalic AV fistula.  The patient was sent by his dialysis center due to new lump that is come up on his access as well as a burning sensation in the access arm for the last few days.  The patient denies that this pain happens during dialysis but more so when he is stuck.  He denies any steal-like symptoms such as numbness or tingling of his hand.  He denies any fever, chills, nausea, vomiting or diarrhea.  He denies any issues with his current dialysis access.  He denies missing any dialysis sessions.  Today noninvasive studies show a flow volume of 3504.  AV fistula is patent.  No hemodynamically significant stenosis seen although there is some change in diameter noticed at the proximal to mid upper arm.   Review of Systems  Skin:       New lump under skin as well as some burning sensation.  Is near his fistula.  All other systems reviewed and are negative.      Objective:   Physical Exam Vitals reviewed.  Constitutional:      Appearance: Normal appearance.  HENT:     Head: Normocephalic.  Cardiovascular:     Pulses:          Radial pulses are 2+ on the right side and 2+ on the left side.     Arteriovenous access: left arteriovenous access is present.    Comments: Good thrill and bruit with some aneurysmal areas. Neurological:     Mental Status: He is alert.  Psychiatric:        Mood and Affect: Mood normal.        Behavior: Behavior normal.        Thought Content: Thought content normal.        Judgment: Judgment normal.     BP 115/65 (BP Location: Right Arm)   Pulse 72   Resp 16   Ht 5\' 8"  (1.727 m)   Wt 157 lb (71.2 kg)   BMI 23.87 kg/m   Past Medical History:  Diagnosis Date  . Arthritis    hands  .  Asthma    uses inhaler  . Coronary artery disease   . Dialysis patient [facility]) 2012   Monday, Wednesday, Friday  . GERD (gastroesophageal reflux disease)   . Gout of right hand   . Hypertension   . Renal disorder    stage V..on dialysis  . Seizures ([place]) 2019   last seizure 5 years ago. had 20 in one day    Social History   Socioeconomic History  . Marital status: Single    Spouse name: Not on file  . Number of children: Not on file  . Years of education: Not on file  . Highest education level: Not on file  Occupational History  . Not on file  Tobacco Use  . Smoking status: Former Smoker    Types: Cigarettes  . Smokeless tobacco: Former Systems developer    Quit date: 2010  . Tobacco comment: did not inhale!!  Substance and Sexual Activity  . Alcohol use: No  . Drug use: No  . Sexual activity: Not Currently  Other Topics Concern  . Not on file  Social History Narrative  . Not on file   Social Determinants of Health   Financial Resource Strain:   . Difficulty of Paying Living Expenses:   Food Insecurity:   . Worried About Charity fundraiser in the Last Year:   . Arboriculturist in the Last Year:   Transportation Needs:   . Film/video editor (Medical):   Marland Kitchen Lack of Transportation (Non-Medical):   Physical Activity:   . Days of Exercise per Week:   . Minutes of Exercise per Session:   Stress:   . Feeling of Stress :   Social Connections:   . Frequency of Communication with Friends and Family:   . Frequency of Social Gatherings with Friends and Family:   . Attends Religious Services:   . Active Member of Clubs or Organizations:   . Attends Archivist Meetings:   Marland Kitchen Marital Status:   Intimate Partner Violence:   . Fear of Current or Ex-Partner:   . Emotionally Abused:   Marland Kitchen Physically Abused:   . Sexually Abused:     Past Surgical History:  Procedure Laterality Date  . A/V FISTULAGRAM Left 02/24/2017   Procedure: A/V Fistulagram;  Surgeon: Katha Cabal, MD;  Location: [place] [facility];  Service: Cardiovascular;  Laterality: Left;  . A/V FISTULAGRAM Left 07/20/2019   Procedure: A/V FISTULAGRAM;  Surgeon: Algernon Huxley, MD;  Location: [place] [facility];  Service: Cardiovascular;  Laterality: Left;  . A/V SHUNT INTERVENTION N/A 02/24/2017   Procedure: A/V Shunt Intervention;  Surgeon: Katha Cabal, MD;  Location: [place] [facility];  Service: Cardiovascular;  Laterality: N/A;  . A/V SHUNT INTERVENTION N/A 11/14/2019   Procedure: A/V SHUNT INTERVENTION;  Surgeon: Algernon Huxley, MD;  Location: [place] [facility];  Service: Cardiovascular;  Laterality: N/A;  . AV FISTULA PLACEMENT Left   . AV FISTULA PLACEMENT Left 12/02/2017   Procedure: ARTERIOVENOUS (AV) FISTULA CREATION ( BRACHIAL CEPHALIC );  Surgeon: Katha Cabal, MD;  Location: [facility];  Service: Vascular;  Laterality: Left;  . CARDIAC CATHETERIZATION  2012   no stents  . COLONOSCOPY Left 09/05/2018   Procedure: COLONOSCOPY;  Surgeon: Virgel Manifold, MD;  Location: [facility] ENDOSCOPY;  Service: Endoscopy;  Laterality: Left;  . DIALYSIS/PERMA CATHETER REMOVAL N/A 08/24/2018   Procedure: DIALYSIS/PERMA CATHETER REMOVAL;  Surgeon: Katha Cabal, MD;  Location: [place] [facility];  Service: Cardiovascular;  Laterality: N/A;  . ESOPHAGOGASTRODUODENOSCOPY Left 09/05/2018   Procedure: ESOPHAGOGASTRODUODENOSCOPY (EGD);  Surgeon: Virgel Manifold, MD;  Location: [facility] ENDOSCOPY;  Service: Endoscopy;  Laterality: Left;  . ESOPHAGOGASTRODUODENOSCOPY (EGD) WITH PROPOFOL N/A 08/31/2018   Procedure: ESOPHAGOGASTRODUODENOSCOPY (EGD) WITH PROPOFOL;  Surgeon: Lucilla Lame, MD;  Location: [facility] ENDOSCOPY;  Service: Endoscopy;  Laterality: N/A;  . GIVENS CAPSULE STUDY N/A 09/07/2018   Procedure: GIVENS CAPSULE STUDY;  Surgeon: Jonathon Bellows, MD;  Location: [facility] ENDOSCOPY;  Service: Gastroenterology;  Laterality: N/A;  . INSERTION OF DIALYSIS CATHETER Left 05/28/2018    Procedure: INSERTION OF DIALYSIS CATHETER ( TUNNELED CATH INSERT );  Surgeon: Katha Cabal, MD;  Location: [facility];  Service: Vascular;  Laterality: Left;  . LIGATION OF ARTERIOVENOUS  FISTULA Left 12/02/2017   Procedure: LIGATION OF ARTERIOVENOUS  FISTULA ( REMOVAL LEFT WRIST FISTULA );  Surgeon: Katha Cabal, MD;  Location: [facility];  Service: Vascular;  Laterality: Left;  . PERIPHERAL VASCULAR CATHETERIZATION Left 06/03/2016   Procedure: A/V Shuntogram/Fistulagram;  Surgeon: Katha Cabal,  MD;  Location: [place] [facility];  Service: Cardiovascular;  Laterality: Left;  . PERIPHERAL VASCULAR CATHETERIZATION  06/03/2016   Procedure: Peripheral Vascular Balloon Angioplasty;  Surgeon: Katha Cabal, MD;  Location: [place] [facility];  Service: Cardiovascular;;  . PERIPHERAL VASCULAR THROMBECTOMY Left 11/14/2019   Procedure: PERIPHERAL VASCULAR THROMBECTOMY;  Surgeon: Algernon Huxley, MD;  Location: [place] [facility];  Service: Cardiovascular;  Laterality: Left;  . REVISON OF ARTERIOVENOUS FISTULA Left 05/28/2018   Procedure: REVISON OF ARTERIOVENOUS FISTULA ( REMOVE ANEURYSM );  Surgeon: Katha Cabal, MD;  Location: [facility];  Service: Vascular;  Laterality: Left;  . UPPER EXTREMITY VENOGRAPHY N/A 04/13/2018   Procedure: UPPER EXTREMITY VENOGRAPHY;  Surgeon: Katha Cabal, MD;  Location: [place] [facility];  Service: Cardiovascular;  Laterality: N/A;    Family History  Problem Relation Age of Onset  . Heart disease Mother   . Kidney disease Paternal Uncle     Allergies  Allergen Reactions  . Dextromethorphan-Guaifenesin Nausea And Vomiting  . Codeine Nausea And Vomiting  . Doxycycline Nausea And Vomiting       Assessment & Plan:   1. 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  Burning sensation may be due to tearing or stretching.  We will have the  patient return in 3 months with noninvasive studies to assess if the pain has worsened or subsided.  However today noninvasive studies show that AV fistula is currently patent with a good flow volume. - VAS US DUPLEX DIALYSIS ACCESS (AVF, AVG); Future  2. Essential hypertension Continue antihypertensive medications as already ordered, these medications have been reviewed and there are no changes at this time.    Current Outpatient Medications on File Prior to Visit  Medication Sig Dispense Refill  . acetaminophen (TYLENOL) 500 MG tablet Take 1 tablet (500 mg total) by mouth every 6 (six) hours as needed for mild pain. 180 tablet 0  . albuterol (PROVENTIL HFA) 108 (90 Base) MCG/ACT inhaler Inhale into the lungs.    Marland Kitchen allopurinol (ZYLOPRIM) 100 MG tablet Take by mouth.    Marland Kitchen aspirin 81 MG EC tablet Take by mouth.    . beclomethasone (QVAR) 80 MCG/ACT inhaler Inhale 1 puff into the lungs as needed (for shortness of breath).    . carvedilol (COREG CR) 10 MG 24 hr capsule     . carvedilol (COREG) 25 MG tablet Take by mouth.    . cephALEXin (KEFLEX) 500 MG capsule Take by mouth.    . cinacalcet (SENSIPAR) 60 MG tablet Take 120 mg by mouth daily.     . cyanocobalamin 1000 MCG tablet Take 1,000 mcg daily by mouth.    . felodipine (PLENDIL) 5 MG 24 hr tablet Take by mouth.    . ferrous sulfate 325 (65 FE) MG tablet TAKE 1 TABLET BY MOUTH 3 TIMES A DAY FOR ANEMIA    . folic acid (FOLVITE) 294 MCG tablet Take by mouth.    . Lacosamide (VIMPAT) 150 MG TABS Take by mouth.    . levETIRAcetam (KEPPRA XR) 500 MG 24 hr tablet Take by mouth.    Marland Kitchen omeprazole (PRILOSEC) 20 MG capsule Take 20 mg by mouth daily.    . sevelamer carbonate (RENVELA) 800 MG tablet Take 2,400 mg by mouth 3 (three) times daily with meals.      No current facility-administered medications on file prior to visit.  There are no Patient Instructions on file for this visit. No follow-ups on file.   Kris Hartmann, NP

## 2020-03-15 ENCOUNTER — Ambulatory Visit: Payer: Medicare PPO | Admitting: Podiatry

## 2020-03-22 ENCOUNTER — Ambulatory Visit (INDEPENDENT_AMBULATORY_CARE_PROVIDER_SITE_OTHER): Payer: Medicare PPO | Admitting: Podiatry

## 2020-03-22 ENCOUNTER — Other Ambulatory Visit: Payer: Self-pay

## 2020-03-22 ENCOUNTER — Encounter: Payer: Self-pay | Admitting: Podiatry

## 2020-03-22 VITALS — Temp 97.6°F

## 2020-03-22 DIAGNOSIS — L84 Corns and callosities: Secondary | ICD-10-CM | POA: Diagnosis not present

## 2020-03-22 DIAGNOSIS — Z992 Dependence on renal dialysis: Secondary | ICD-10-CM | POA: Diagnosis not present

## 2020-03-22 DIAGNOSIS — M79676 Pain in unspecified toe(s): Secondary | ICD-10-CM

## 2020-03-22 DIAGNOSIS — N186 End stage renal disease: Secondary | ICD-10-CM

## 2020-03-22 DIAGNOSIS — B351 Tinea unguium: Secondary | ICD-10-CM

## 2020-03-22 NOTE — Progress Notes (Signed)
This patient returns to my office for at risk foot care.  This patient requires this care by a professional since this patient will be at risk due to having  ESRD.  This patient is unable to cut nails himself since the patient cannot reach his nails.These nails are painful walking and wearing shoes. Patient says his calluses have improved with medicine but callus persists This patient presents for at risk foot care today.  General Appearance  Alert, conversant and in no acute stress.  Vascular  Dorsalis pedis and posterior tibial  pulses are palpable  bilaterally.  Capillary return is within normal limits  bilaterally. Temperature is within normal limits  bilaterally.  Neurologic  Senn-Weinstein monofilament wire test within normal limits  bilaterally. Muscle power within normal limits bilaterally.  Nails Thick disfigured discolored nails with subungual debris  from hallux to fifth toes bilaterally. No evidence of bacterial infection or drainage bilaterally.  Orthopedic  No limitations of motion  feet .  No crepitus or effusions noted.  No bony pathology or digital deformities noted.  Skin  normotropic skin with no porokeratosis noted bilaterally.  No signs of infections or ulcers noted.   Asymptomatic callus 1,5  B/L.  Painful heel callus  B/L.  Onychomycosis  Pain in right toes  Pain in left toes  Consent was obtained for treatment procedures.   Mechanical debridement of nails 1-5  bilaterally performed with a nail nipper.  Filed with dremel without incident. Debride heel callus with dremel tool.   Return office visit  3 months                    Told patient to return for periodic foot care and evaluation due to potential at risk complications.   Gardiner Barefoot DPM

## 2020-03-29 ENCOUNTER — Other Ambulatory Visit: Payer: Self-pay

## 2020-03-29 ENCOUNTER — Ambulatory Visit (INDEPENDENT_AMBULATORY_CARE_PROVIDER_SITE_OTHER): Payer: Medicare PPO

## 2020-03-29 ENCOUNTER — Telehealth (INDEPENDENT_AMBULATORY_CARE_PROVIDER_SITE_OTHER): Payer: Self-pay

## 2020-03-29 ENCOUNTER — Encounter (INDEPENDENT_AMBULATORY_CARE_PROVIDER_SITE_OTHER): Payer: Self-pay

## 2020-03-29 ENCOUNTER — Encounter (INDEPENDENT_AMBULATORY_CARE_PROVIDER_SITE_OTHER): Payer: Self-pay | Admitting: Nurse Practitioner

## 2020-03-29 ENCOUNTER — Ambulatory Visit (INDEPENDENT_AMBULATORY_CARE_PROVIDER_SITE_OTHER): Payer: Medicare PPO | Admitting: Nurse Practitioner

## 2020-03-29 VITALS — BP 134/76 | HR 74 | Ht 68.0 in | Wt 155.0 lb

## 2020-03-29 DIAGNOSIS — I10 Essential (primary) hypertension: Secondary | ICD-10-CM

## 2020-03-29 DIAGNOSIS — N186 End stage renal disease: Secondary | ICD-10-CM

## 2020-03-29 DIAGNOSIS — Z992 Dependence on renal dialysis: Secondary | ICD-10-CM | POA: Diagnosis not present

## 2020-03-29 DIAGNOSIS — M79602 Pain in left arm: Secondary | ICD-10-CM | POA: Diagnosis not present

## 2020-03-29 DIAGNOSIS — R000 Tachycardia, unspecified: Secondary | ICD-10-CM | POA: Insufficient documentation

## 2020-03-29 DIAGNOSIS — F70 Mild intellectual disabilities: Secondary | ICD-10-CM | POA: Insufficient documentation

## 2020-03-29 NOTE — Progress Notes (Signed)
Subjective:    Patient ID: Jeremy Rose, male    DOB: June 16, 1956, 64 y.o.   MRN: [id number] Chief Complaint  Patient presents with  . Follow-up    U/S Follow up    Patient presents today complaining of pain in his left upper extremity during dialysis.  The patient states that the pain is located at the proximal portion of the and aneurysmal area.  The patient describes it as a shooting throbbing pain.  He is unable to describe whether happened on the right dialysis or with just needle insertion or deaccessing.  He is also not truly able to tell me if it happens consistently or if this was just a one-time occurrence.  The patient does note that he has had pain in that area before it was just not as severe.  He denies missing any dialysis sessions or any excessive bleeding.  He denies any steal-like symptoms.  He denies any numbness, pain or discoloration in his hands.  The patient underwent a hemodialysis access which showed flow volume of 3960.  This is slightly increased from the previous flow volume of 3504.  There is also noted in the mid upper arm there is a change in diameter from 1.43 to 0.32 cm.  However overall the AV fistula is patent.   Review of Systems  Musculoskeletal:       Shooting arm pain during dialysis sometimes  All other systems reviewed and are negative.      Objective:   Physical Exam Vitals reviewed.  Constitutional:      Appearance: Normal appearance.  Cardiovascular:     Rate and Rhythm: Normal rate and regular rhythm.     Pulses: Normal pulses.          Radial pulses are 2+ on the right side and 2+ on the left side.     Heart sounds: Normal heart sounds.     Arteriovenous access: left arteriovenous access is present.    Comments: Good thrill and bruit Pulmonary:     Effort: Pulmonary effort is normal.     Breath sounds: Normal breath sounds.  Musculoskeletal:        General: Normal range of motion.  Neurological:     Mental Status: He is  alert and oriented to person, place, and time.  Psychiatric:        Mood and Affect: Mood normal.        Behavior: Behavior normal.        Thought Content: Thought content normal.        Judgment: Judgment normal.     BP 134/76   Pulse 74   Ht 5\' 8"  (1.727 m)   Wt 155 lb (70.3 kg)   BMI 23.57 kg/m   Past Medical History:  Diagnosis Date  . Arthritis    hands  . Asthma    uses inhaler  . Coronary artery disease   . Dialysis patient [facility]) 2012   Monday, Wednesday, Friday  . GERD (gastroesophageal reflux disease)   . Gout of right hand   . Hypertension   . Renal disorder    stage V..on dialysis  . Seizures ([place]) 2019   last seizure 5 years ago. had 20 in one day    Social History   Socioeconomic History  . Marital status: Single    Spouse name: Not on file  . Number of children: Not on file  . Years of education: Not on file  . Highest education level:  Not on file  Occupational History  . Not on file  Tobacco Use  . Smoking status: Former Smoker    Types: Cigarettes  . Smokeless tobacco: Former Systems developer    Quit date: 2010  . Tobacco comment: did not inhale!!  Substance and Sexual Activity  . Alcohol use: No  . Drug use: No  . Sexual activity: Not Currently  Other Topics Concern  . Not on file  Social History Narrative  . Not on file   Social Determinants of Health   Financial Resource Strain:   . Difficulty of Paying Living Expenses:   Food Insecurity:   . Worried About Charity fundraiser in the Last Year:   . Arboriculturist in the Last Year:   Transportation Needs:   . Film/video editor (Medical):   Marland Kitchen Lack of Transportation (Non-Medical):   Physical Activity:   . Days of Exercise per Week:   . Minutes of Exercise per Session:   Stress:   . Feeling of Stress :   Social Connections:   . Frequency of Communication with Friends and Family:   . Frequency of Social Gatherings with Friends and Family:   . Attends Religious Services:   . Active  Member of Clubs or Organizations:   . Attends Archivist Meetings:   Marland Kitchen Marital Status:   Intimate Partner Violence:   . Fear of Current or Ex-Partner:   . Emotionally Abused:   Marland Kitchen Physically Abused:   . Sexually Abused:     Past Surgical History:  Procedure Laterality Date  . A/V FISTULAGRAM Left 02/24/2017   Procedure: A/V Fistulagram;  Surgeon: Katha Cabal, MD;  Location: [place] [facility];  Service: Cardiovascular;  Laterality: Left;  . A/V FISTULAGRAM Left 07/20/2019   Procedure: A/V FISTULAGRAM;  Surgeon: Algernon Huxley, MD;  Location: [place] [facility];  Service: Cardiovascular;  Laterality: Left;  . A/V SHUNT INTERVENTION N/A 02/24/2017   Procedure: A/V Shunt Intervention;  Surgeon: Katha Cabal, MD;  Location: [place] [facility];  Service: Cardiovascular;  Laterality: N/A;  . A/V SHUNT INTERVENTION N/A 11/14/2019   Procedure: A/V SHUNT INTERVENTION;  Surgeon: Algernon Huxley, MD;  Location: [place] [facility];  Service: Cardiovascular;  Laterality: N/A;  . AV FISTULA PLACEMENT Left   . AV FISTULA PLACEMENT Left 12/02/2017   Procedure: ARTERIOVENOUS (AV) FISTULA CREATION ( BRACHIAL CEPHALIC );  Surgeon: Katha Cabal, MD;  Location: [facility];  Service: Vascular;  Laterality: Left;  . CARDIAC CATHETERIZATION  2012   no stents  . COLONOSCOPY Left 09/05/2018   Procedure: COLONOSCOPY;  Surgeon: Virgel Manifold, MD;  Location: [facility] ENDOSCOPY;  Service: Endoscopy;  Laterality: Left;  . DIALYSIS/PERMA CATHETER REMOVAL N/A 08/24/2018   Procedure: DIALYSIS/PERMA CATHETER REMOVAL;  Surgeon: Katha Cabal, MD;  Location: [place] [facility];  Service: Cardiovascular;  Laterality: N/A;  . ESOPHAGOGASTRODUODENOSCOPY Left 09/05/2018   Procedure: ESOPHAGOGASTRODUODENOSCOPY (EGD);  Surgeon: Virgel Manifold, MD;  Location: [facility] ENDOSCOPY;  Service: Endoscopy;  Laterality: Left;  . ESOPHAGOGASTRODUODENOSCOPY (EGD) WITH PROPOFOL N/A 08/31/2018   Procedure:  ESOPHAGOGASTRODUODENOSCOPY (EGD) WITH PROPOFOL;  Surgeon: Lucilla Lame, MD;  Location: [facility] ENDOSCOPY;  Service: Endoscopy;  Laterality: N/A;  . GIVENS CAPSULE STUDY N/A 09/07/2018   Procedure: GIVENS CAPSULE STUDY;  Surgeon: Jonathon Bellows, MD;  [facility] ENDOSCOPY;  Service: Gastroenterology;  Laterality: N/A;  . INSERTION OF DIALYSIS CATHETER Left 05/28/2018   Procedure: INSERTION OF DIALYSIS CATHETER ( TUNNELED  CATH INSERT );  Surgeon: Katha Cabal, MD;  Location: [facility];  Service: Vascular;  Laterality: Left;  . LIGATION OF ARTERIOVENOUS  FISTULA Left 12/02/2017   Procedure: LIGATION OF ARTERIOVENOUS  FISTULA ( REMOVAL LEFT WRIST FISTULA );  Surgeon: Katha Cabal, MD;  Location: [facility];  Service: Vascular;  Laterality: Left;  . PERIPHERAL VASCULAR CATHETERIZATION Left 06/03/2016   Procedure: A/V Shuntogram/Fistulagram;  Surgeon: Katha Cabal, MD;  Location: [place] [facility];  Service: Cardiovascular;  Laterality: Left;  . PERIPHERAL VASCULAR CATHETERIZATION  06/03/2016   Procedure: Peripheral Vascular Balloon Angioplasty;  Surgeon: Katha Cabal, MD;  Location: [place] [facility];  Service: Cardiovascular;;  . PERIPHERAL VASCULAR THROMBECTOMY Left 11/14/2019   Procedure: PERIPHERAL VASCULAR THROMBECTOMY;  Surgeon: Algernon Huxley, MD;  Location: [place] [facility];  Service: Cardiovascular;  Laterality: Left;  . REVISON OF ARTERIOVENOUS FISTULA Left 05/28/2018   Procedure: REVISON OF ARTERIOVENOUS FISTULA ( REMOVE ANEURYSM );  Surgeon: Katha Cabal, MD;  Location: [facility];  Service: Vascular;  Laterality: Left;  . UPPER EXTREMITY VENOGRAPHY N/A 04/13/2018   Procedure: UPPER EXTREMITY VENOGRAPHY;  Surgeon: Katha Cabal, MD;  Location: [place] [facility];  Service: Cardiovascular;  Laterality: N/A;    Family History  Problem Relation Age of Onset  . Heart disease Mother   . Kidney disease Paternal Uncle     Allergies  Allergen Reactions  .  Dextromethorphan-Guaifenesin Nausea And Vomiting  . Codeine Nausea And Vomiting  . Doxycycline Nausea And Vomiting       Assessment & Plan:   1. ESRD on hemodialysis [facility]) Recommend:  The patient is experiencing increasing problems with their dialysis access.  Patient should have a fistulagram with the intention for intervention.  The intention for intervention is to restore appropriate flow and prevent thrombosis and possible loss of the access.  As well as improve the quality of dialysis therapy.  The risks, benefits and alternative therapies were reviewed in detail with the patient.  All questions were answered.  The patient agrees to proceed with angio/intervention.      2. Essential hypertension Continue antihypertensive medications as already ordered, these medications have been reviewed and there are no changes at this time.   3. Pain of left upper extremity Description with the patient regarding his upper extremity arm pain.  If following the fistulogram the arm pain has not resolved and there is no possible cause for the arm pain it could also be related to some arthritic changes or how his fistula is being accessed.   Current Outpatient Medications on File Prior to Visit  Medication Sig Dispense Refill  . acetaminophen (TYLENOL) 500 MG tablet Take 1 tablet (500 mg total) by mouth every 6 (six) hours as needed for mild pain. 180 tablet 0  . albuterol (PROVENTIL HFA) 108 (90 Base) MCG/ACT inhaler Inhale into the lungs.    Marland Kitchen allopurinol (ZYLOPRIM) 100 MG tablet Take by mouth.    Marland Kitchen aspirin 81 MG EC tablet Take by mouth.    . beclomethasone (QVAR) 80 MCG/ACT inhaler Inhale 1 puff into the lungs as needed (for shortness of breath).    . carvedilol (COREG CR) 10 MG 24 hr capsule     . carvedilol (COREG) 25 MG tablet Take by mouth.    . cephALEXin (KEFLEX) 500 MG capsule Take by mouth.    . cinacalcet (SENSIPAR) 60 MG tablet Take 120 mg by mouth daily.     . cyanocobalamin  1000 MCG tablet Take 1,000 mcg daily by mouth.    . felodipine (PLENDIL) 5 MG 24 hr tablet Take by mouth.    . ferrous sulfate 325 (65 FE) MG tablet TAKE 1 TABLET BY MOUTH 3 TIMES A DAY FOR ANEMIA    . folic acid (FOLVITE) 680 MCG tablet Take by mouth.    . levETIRAcetam (KEPPRA XR) 500 MG 24 hr tablet Take by mouth.    . Methoxy PEG-Epoetin Beta (MIRCERA IJ) Mircera    . omeprazole (PRILOSEC) 20 MG capsule Take 20 mg by mouth daily.    . sevelamer carbonate (RENVELA) 800 MG tablet Take 2,400 mg by mouth 3 (three) times daily with meals.     Marland Kitchen VIMPAT 100 MG TABS      No current facility-administered medications on file prior to visit.    There are no Patient Instructions on file for this visit. No follow-ups on file.   Kris Hartmann, NP

## 2020-03-29 NOTE — Telephone Encounter (Signed)
Spoke with Paulette per the patient and he is now scheduled for a LUE fistulagram with [name] on 04/03/20 with a 11:45 am arrival time to the [facility]. Patient will do covid testing on 03/30/20 between 8-1 pm at the [facility]. Pre-procedure instructions were discussed and will be mailed.

## 2020-03-30 ENCOUNTER — Other Ambulatory Visit
Admission: RE | Admit: 2020-03-30 | Discharge: 2020-03-30 | Disposition: A | Discharge status: Home / Self Care | Payer: Medicare PPO | Source: Ambulatory Visit | Attending: Vascular Surgery | Admitting: Vascular Surgery

## 2020-03-30 DIAGNOSIS — Z01812 Encounter for preprocedural laboratory examination: Secondary | ICD-10-CM | POA: Insufficient documentation

## 2020-03-30 DIAGNOSIS — Z20822 Contact with and (suspected) exposure to covid-19: Secondary | ICD-10-CM | POA: Diagnosis not present

## 2020-03-30 LAB — SARS CORONAVIRUS 2 (TAT 6-24 HRS): SARS Coronavirus 2: NEGATIVE

## 2020-04-02 ENCOUNTER — Other Ambulatory Visit (INDEPENDENT_AMBULATORY_CARE_PROVIDER_SITE_OTHER): Payer: Self-pay | Admitting: Nurse Practitioner

## 2020-04-03 ENCOUNTER — Other Ambulatory Visit: Payer: Self-pay

## 2020-04-03 ENCOUNTER — Encounter
Admission: RE | Disposition: A | Discharge status: Home / Self Care | Payer: Self-pay | Source: Home / Self Care | Attending: Vascular Surgery

## 2020-04-03 ENCOUNTER — Ambulatory Visit
Admission: RE | Admit: 2020-04-03 | Discharge: 2020-04-03 | Disposition: A | Discharge status: Home / Self Care | Payer: Medicare PPO | Attending: Vascular Surgery | Admitting: Vascular Surgery

## 2020-04-03 DIAGNOSIS — Z841 Family history of disorders of kidney and ureter: Secondary | ICD-10-CM | POA: Insufficient documentation

## 2020-04-03 DIAGNOSIS — Z8669 Personal history of other diseases of the nervous system and sense organs: Secondary | ICD-10-CM | POA: Diagnosis not present

## 2020-04-03 DIAGNOSIS — Z888 Allergy status to other drugs, medicaments and biological substances status: Secondary | ICD-10-CM | POA: Diagnosis not present

## 2020-04-03 DIAGNOSIS — M19042 Primary osteoarthritis, left hand: Secondary | ICD-10-CM | POA: Insufficient documentation

## 2020-04-03 DIAGNOSIS — M109 Gout, unspecified: Secondary | ICD-10-CM | POA: Insufficient documentation

## 2020-04-03 DIAGNOSIS — I2510 Atherosclerotic heart disease of native coronary artery without angina pectoris: Secondary | ICD-10-CM | POA: Diagnosis not present

## 2020-04-03 DIAGNOSIS — Z7982 Long term (current) use of aspirin: Secondary | ICD-10-CM | POA: Insufficient documentation

## 2020-04-03 DIAGNOSIS — Z87891 Personal history of nicotine dependence: Secondary | ICD-10-CM | POA: Insufficient documentation

## 2020-04-03 DIAGNOSIS — T82898A Other specified complication of vascular prosthetic devices, implants and grafts, initial encounter: Secondary | ICD-10-CM | POA: Diagnosis not present

## 2020-04-03 DIAGNOSIS — M19041 Primary osteoarthritis, right hand: Secondary | ICD-10-CM | POA: Insufficient documentation

## 2020-04-03 DIAGNOSIS — Z8249 Family history of ischemic heart disease and other diseases of the circulatory system: Secondary | ICD-10-CM | POA: Diagnosis not present

## 2020-04-03 DIAGNOSIS — K219 Gastro-esophageal reflux disease without esophagitis: Secondary | ICD-10-CM | POA: Diagnosis not present

## 2020-04-03 DIAGNOSIS — N186 End stage renal disease: Secondary | ICD-10-CM | POA: Insufficient documentation

## 2020-04-03 DIAGNOSIS — Y832 Surgical operation with anastomosis, bypass or graft as the cause of abnormal reaction of the patient, or of later complication, without mention of misadventure at the time of the procedure: Secondary | ICD-10-CM | POA: Insufficient documentation

## 2020-04-03 DIAGNOSIS — Z885 Allergy status to narcotic agent status: Secondary | ICD-10-CM | POA: Diagnosis not present

## 2020-04-03 DIAGNOSIS — I129 Hypertensive chronic kidney disease with stage 1 through stage 4 chronic kidney disease, or unspecified chronic kidney disease: Secondary | ICD-10-CM | POA: Insufficient documentation

## 2020-04-03 DIAGNOSIS — T829XXA Unspecified complication of cardiac and vascular prosthetic device, implant and graft, initial encounter: Secondary | ICD-10-CM | POA: Insufficient documentation

## 2020-04-03 DIAGNOSIS — Z992 Dependence on renal dialysis: Secondary | ICD-10-CM | POA: Insufficient documentation

## 2020-04-03 DIAGNOSIS — Z881 Allergy status to other antibiotic agents status: Secondary | ICD-10-CM | POA: Diagnosis not present

## 2020-04-03 DIAGNOSIS — J45909 Unspecified asthma, uncomplicated: Secondary | ICD-10-CM | POA: Diagnosis not present

## 2020-04-03 DIAGNOSIS — Z79899 Other long term (current) drug therapy: Secondary | ICD-10-CM | POA: Insufficient documentation

## 2020-04-03 HISTORY — PX: A/V FISTULAGRAM: CATH118298

## 2020-04-03 LAB — POTASSIUM (ARMC VASCULAR LAB ONLY): Potassium (ARMC vascular lab): 4.2 (ref 3.5–5.1)

## 2020-04-03 SURGERY — A/V FISTULAGRAM
Anesthesia: Moderate Sedation | Site: Arm Upper | Laterality: Left

## 2020-04-03 MED ORDER — ONDANSETRON HCL 4 MG/2ML IJ SOLN: 4.0000 mg | Freq: Four times a day (QID) | INTRAMUSCULAR | Status: DC | PRN

## 2020-04-03 MED ORDER — MIDAZOLAM HCL 2 MG/2ML IJ SOLN
INTRAMUSCULAR | Status: DC | PRN
  Administered 2020-04-03: 2 mg via INTRAVENOUS

## 2020-04-03 MED ORDER — MIDAZOLAM HCL 2 MG/ML PO SYRP: 8.0000 mg | ORAL_SOLUTION | Freq: Once | ORAL | Status: DC | PRN

## 2020-04-03 MED ORDER — DIPHENHYDRAMINE HCL 50 MG/ML IJ SOLN: 50.0000 mg | Freq: Once | INTRAMUSCULAR | Status: DC | PRN

## 2020-04-03 MED ORDER — METHYLPREDNISOLONE SODIUM SUCC 125 MG IJ SOLR: 125.0000 mg | Freq: Once | INTRAMUSCULAR | Status: DC | PRN

## 2020-04-03 MED ORDER — HYDROMORPHONE HCL 1 MG/ML IJ SOLN: 1.0000 mg | Freq: Once | INTRAMUSCULAR | Status: DC | PRN

## 2020-04-03 MED ORDER — CEFAZOLIN SODIUM-DEXTROSE 1-4 GM/50ML-% IV SOLN: 1.0000 g | Freq: Once | INTRAVENOUS | Status: AC

## 2020-04-03 MED ORDER — SODIUM CHLORIDE 0.9 % IV SOLN: INTRAVENOUS | Status: DC

## 2020-04-03 MED ORDER — FAMOTIDINE 20 MG PO TABS: 40.0000 mg | ORAL_TABLET | Freq: Once | ORAL | Status: DC | PRN

## 2020-04-03 MED ORDER — HEPARIN SODIUM (PORCINE) 1000 UNIT/ML IJ SOLN
INTRAMUSCULAR | Status: DC | PRN
  Administered 2020-04-03: 3000 [IU] via INTRAVENOUS

## 2020-04-03 MED ORDER — CEFAZOLIN SODIUM-DEXTROSE 1-4 GM/50ML-% IV SOLN
INTRAVENOUS | Status: AC
  Administered 2020-04-03: 1 g via INTRAVENOUS
  Filled 2020-04-03: qty 50

## 2020-04-03 MED ORDER — FENTANYL CITRATE (PF) 100 MCG/2ML IJ SOLN
INTRAMUSCULAR | Status: DC | PRN
  Administered 2020-04-03: 50 ug via INTRAVENOUS

## 2020-04-03 MED ORDER — HEPARIN SODIUM (PORCINE) 1000 UNIT/ML IJ SOLN
INTRAMUSCULAR | Status: AC
  Filled 2020-04-03: qty 1

## 2020-04-03 MED ORDER — MIDAZOLAM HCL 5 MG/5ML IJ SOLN
INTRAMUSCULAR | Status: AC
  Filled 2020-04-03: qty 5

## 2020-04-03 MED ORDER — FENTANYL CITRATE (PF) 100 MCG/2ML IJ SOLN
INTRAMUSCULAR | Status: AC
  Filled 2020-04-03: qty 2

## 2020-04-03 SURGICAL SUPPLY — 4 items
CANNULA 5F STIFF (CANNULA) ×3 IMPLANT
PACK ANGIOGRAPHY (CUSTOM PROCEDURE TRAY) ×3 IMPLANT
SHEATH BRITE TIP 6FRX5.5 (SHEATH) ×3 IMPLANT
SUT MNCRL AB 4-0 PS2 18 (SUTURE) ×3 IMPLANT

## 2020-04-03 NOTE — Progress Notes (Signed)
[name] came by bedside to speak with pt. Re: fistulagram. Pt. Verbalized understanding.

## 2020-04-03 NOTE — H&P (Signed)
[facility] History & Physical Update  The patient was interviewed and re-examined.  The patient's previous History and Physical has been reviewed and is unchanged.  There is no change in the plan of care. We plan to proceed with the scheduled procedure.  [name], MD  04/03/2020, 12:44 PM

## 2020-04-03 NOTE — Op Note (Signed)
[facility]    OPERATIVE NOTE   PROCEDURE: 1.   Left brachiocephalic arteriovenous fistula cannulation under ultrasound guidance 2.   Left arm fistulagram including central venogram   PRE-OPERATIVE DIAGNOSIS: 1. ESRD 2. Poorly functional, aneurysmal left brachiocephalic AVF  POST-OPERATIVE DIAGNOSIS: same as above   SURGEON: [name], MD  ANESTHESIA: local with MCS  ESTIMATED BLOOD LOSS: 5 cc  FINDING(S): 1. Aneurysmal left brachiocephalic AV fistula with mild narrowing at the cephalic vein subclavian vein confluence but no hemodynamically significant stenosis within the fistula or the central venous circulation.  SPECIMEN(S):  None  CONTRAST: 35 cc  FLUORO TIME: 0.8 minutes  MODERATE CONSCIOUS SEDATION TIME: Approximately 15 minutes with 2 mg of Versed and 50 mcg of Fentanyl   INDICATIONS: Jeremy Rose is a 65 y.o. male who presents with malfunctioning aneurysmal left brachiocephalic arteriovenous fistula.  The patient is scheduled for left arm fistulagram.  The patient is aware the risks include but are not limited to: bleeding, infection, thrombosis of the cannulated access, and possible anaphylactic reaction to the contrast.  The patient is aware of the risks of the procedure and elects to proceed forward.  DESCRIPTION: After full informed written consent was obtained, the patient was brought back to the angiography suite and placed supine upon the angiography table.  The patient was connected to monitoring equipment. Moderate conscious sedation was administered with a face to face encounter with the patient throughout the procedure with my supervision of the RN administering medicines and monitoring the patient's vital signs and mental status throughout from the start of the procedure until the patient was taken to the recovery room. The left arm was prepped and draped in the standard fashion for a percutaneous access intervention.  Under  ultrasound guidance, the left brachiocephalic arteriovenous fistula was cannulated with a micropuncture needle under direct ultrasound guidance where it was patent and a permanent image was performed.  The microwire was advanced into the fistula and the needle was exchanged for the a microsheath.  I then upsized to a 6 Fr Sheath and imaging was performed.  Hand injections were completed to image the access including the central venous system. This demonstrated aneurysmal left brachiocephalic AV fistula with mild narrowing at the cephalic vein subclavian vein confluence but no hemodynamically significant stenosis within the fistula or the central venous circulation.  Based on the images, this patient will need no intervention.  A 4-0 Monocryl purse-string suture was sewn around the sheath.  The sheath was removed while tying down the suture.  A sterile bandage was applied to the puncture site.  COMPLICATIONS: None  CONDITION: Stable   [name]  04/03/2020 1:58 PM   This note was created with Dragon Medical transcription system. Any errors in dictation are purely unintentional.

## 2020-04-04 ENCOUNTER — Encounter: Payer: Self-pay | Admitting: Cardiology

## 2020-05-17 ENCOUNTER — Encounter (INDEPENDENT_AMBULATORY_CARE_PROVIDER_SITE_OTHER): Payer: Medicare PPO

## 2020-05-17 ENCOUNTER — Ambulatory Visit (INDEPENDENT_AMBULATORY_CARE_PROVIDER_SITE_OTHER): Payer: Medicare PPO | Admitting: Nurse Practitioner

## 2020-06-12 ENCOUNTER — Encounter (INDEPENDENT_AMBULATORY_CARE_PROVIDER_SITE_OTHER): Payer: Medicare PPO

## 2020-06-12 ENCOUNTER — Ambulatory Visit (INDEPENDENT_AMBULATORY_CARE_PROVIDER_SITE_OTHER): Payer: Medicare PPO | Admitting: Vascular Surgery

## 2020-06-13 ENCOUNTER — Ambulatory Visit (INDEPENDENT_AMBULATORY_CARE_PROVIDER_SITE_OTHER): Payer: Medicare PPO | Admitting: Nurse Practitioner

## 2020-06-13 ENCOUNTER — Encounter (INDEPENDENT_AMBULATORY_CARE_PROVIDER_SITE_OTHER): Payer: Medicare PPO

## 2020-06-14 ENCOUNTER — Ambulatory Visit (INDEPENDENT_AMBULATORY_CARE_PROVIDER_SITE_OTHER): Payer: Medicare PPO | Admitting: Nurse Practitioner

## 2020-06-14 ENCOUNTER — Other Ambulatory Visit: Payer: Self-pay

## 2020-06-14 ENCOUNTER — Other Ambulatory Visit (INDEPENDENT_AMBULATORY_CARE_PROVIDER_SITE_OTHER): Payer: Self-pay | Admitting: Nurse Practitioner

## 2020-06-14 ENCOUNTER — Ambulatory Visit (INDEPENDENT_AMBULATORY_CARE_PROVIDER_SITE_OTHER): Payer: Medicare PPO

## 2020-06-14 ENCOUNTER — Encounter (INDEPENDENT_AMBULATORY_CARE_PROVIDER_SITE_OTHER): Payer: Self-pay | Admitting: Nurse Practitioner

## 2020-06-14 VITALS — BP 112/64 | HR 73 | Resp 16 | Wt 156.0 lb

## 2020-06-14 DIAGNOSIS — N186 End stage renal disease: Secondary | ICD-10-CM

## 2020-06-14 DIAGNOSIS — I10 Essential (primary) hypertension: Secondary | ICD-10-CM | POA: Diagnosis not present

## 2020-06-14 DIAGNOSIS — Z992 Dependence on renal dialysis: Secondary | ICD-10-CM

## 2020-06-14 DIAGNOSIS — M79602 Pain in left arm: Secondary | ICD-10-CM

## 2020-06-18 ENCOUNTER — Encounter (INDEPENDENT_AMBULATORY_CARE_PROVIDER_SITE_OTHER): Payer: Self-pay | Admitting: Nurse Practitioner

## 2020-06-18 NOTE — Progress Notes (Signed)
Subjective:    Patient ID: Jeremy Rose, male    DOB: 09/12/1956, 64 y.o.   MRN: [id number] Chief Complaint  Patient presents with  . Follow-up    ultrasound follow up    The patient returns to the office for followup status post intervention of the dialysis access left brachiocephalic AV fistula.  The patient initially underwent intervention due to pain in his left upper extremity.  The patient noted pain in his fistula.  Intervention found his fistula was widely patent with no evidence of stenosis or possible causes for the pain that he is experiencing.  Despite that the patient is still describing pain in his arm.  The pain is just located in the proximal portion of the aneurysmal area.  He notes a throbbing shooting almost stabbing-like pain.  He cannot describe whether it happens with the needle accessing, deaccessing or just in general.  He is not a very good historian when it comes to describing the pain.  He denies any missed dialysis sessions or any excessive bleeding.  The patient denies amaurosis fugax or recent TIA symptoms. There are no recent neurological changes noted. The patient denies claudication symptoms or rest pain symptoms. The patient denies history of DVT, PE or superficial thrombophlebitis. The patient denies recent episodes of angina or shortness of breath.    The patient has a patent brachiocephalic AV fistula.  Compression shows no significant change in the radial artery velocities however they had a patent radiocephalic AV fistula prior to his current AV fistula.  He has no evidence of steal.  Today's flow volume is 5006.     Review of Systems  Musculoskeletal: Positive for arthralgias and myalgias.  All other systems reviewed and are negative.      Objective:   Physical Exam Vitals reviewed.  HENT:     Head: Normocephalic.  Cardiovascular:     Rate and Rhythm: Normal rate.     Pulses: Normal pulses.          Radial pulses are 2+ on the  right side and 2+ on the left side.     Arteriovenous access: left arteriovenous access is present.    Comments: Left brachiocephalic AV fistula with good thrill and bruit Pulmonary:     Effort: Pulmonary effort is normal.  Skin:    General: Skin is warm and dry.  Neurological:     Mental Status: He is alert and oriented to person, place, and time.  Psychiatric:        Mood and Affect: Mood normal.        Behavior: Behavior normal.        Thought Content: Thought content normal.        Judgment: Judgment normal.     BP (!) 112/64 (BP Location: Right Arm)   Pulse 73   Resp 16   Wt 156 lb (70.8 kg)   BMI 23.72 kg/m   Past Medical History:  Diagnosis Date  . Arthritis    hands  . Asthma    uses inhaler  . Coronary artery disease   . Dialysis patient [facility]) 2012   Monday, Wednesday, Friday  . GERD (gastroesophageal reflux disease)   . Gout of right hand   . Hypertension   . Renal disorder    stage V..on dialysis  . Seizures ([place]) 2019   last seizure 5 years ago. had 20 in one day    Social History   Socioeconomic History  . Marital status: Single  Spouse name: Not on file  . Number of children: Not on file  . Years of education: Not on file  . Highest education level: Not on file  Occupational History  . Not on file  Tobacco Use  . Smoking status: Former Smoker    Types: Cigarettes  . Smokeless tobacco: Former Systems developer    Quit date: 2010  . Tobacco comment: did not inhale!!  Vaping Use  . Vaping Use: Never used  Substance and Sexual Activity  . Alcohol use: No  . Drug use: No  . Sexual activity: Not Currently  Other Topics Concern  . Not on file  Social History Narrative  . Not on file   Social Determinants of Health   Financial Resource Strain:   . Difficulty of Paying Living Expenses:   Food Insecurity:   . Worried About Charity fundraiser in the Last Year:   . Arboriculturist in the Last Year:   Transportation Needs:   . Film/video editor  (Medical):   Marland Kitchen Lack of Transportation (Non-Medical):   Physical Activity:   . Days of Exercise per Week:   . Minutes of Exercise per Session:   Stress:   . Feeling of Stress :   Social Connections:   . Frequency of Communication with Friends and Family:   . Frequency of Social Gatherings with Friends and Family:   . Attends Religious Services:   . Active Member of Clubs or Organizations:   . Attends Archivist Meetings:   Marland Kitchen Marital Status:   Intimate Partner Violence:   . Fear of Current or Ex-Partner:   . Emotionally Abused:   Marland Kitchen Physically Abused:   . Sexually Abused:     Past Surgical History:  Procedure Laterality Date  . A/V FISTULAGRAM Left 02/24/2017   Procedure: A/V Fistulagram;  Surgeon: Katha Cabal, MD;  Location: [place] [facility];  Service: Cardiovascular;  Laterality: Left;  . A/V FISTULAGRAM Left 07/20/2019   Procedure: A/V FISTULAGRAM;  Surgeon: Algernon Huxley, MD;  Location: [place] [facility];  Service: Cardiovascular;  Laterality: Left;  . A/V FISTULAGRAM Left 04/03/2020   Procedure: A/V FISTULAGRAM;  Surgeon: Algernon Huxley, MD;  Location: [place] [facility];  Service: Cardiovascular;  Laterality: Left;  . A/V SHUNT INTERVENTION N/A 02/24/2017   Procedure: A/V Shunt Intervention;  Surgeon: Katha Cabal, MD;  Location: [place] [facility];  Service: Cardiovascular;  Laterality: N/A;  . A/V SHUNT INTERVENTION N/A 11/14/2019   Procedure: A/V SHUNT INTERVENTION;  Surgeon: Algernon Huxley, MD;  Location: [place] [facility];  Service: Cardiovascular;  Laterality: N/A;  . AV FISTULA PLACEMENT Left   . AV FISTULA PLACEMENT Left 12/02/2017   Procedure: ARTERIOVENOUS (AV) FISTULA CREATION ( BRACHIAL CEPHALIC );  Surgeon: Katha Cabal, MD;  Location: [facility];  Service: Vascular;  Laterality: Left;  . CARDIAC CATHETERIZATION  2012   no stents  . COLONOSCOPY Left 09/05/2018   Procedure: COLONOSCOPY;  Surgeon: Virgel Manifold, MD;  Location:  [facility] ENDOSCOPY;  Service: Endoscopy;  Laterality: Left;  . DIALYSIS/PERMA CATHETER REMOVAL N/A 08/24/2018   Procedure: DIALYSIS/PERMA CATHETER REMOVAL;  Surgeon: Katha Cabal, MD;  Location: [place] [facility];  Service: Cardiovascular;  Laterality: N/A;  . ESOPHAGOGASTRODUODENOSCOPY Left 09/05/2018   Procedure: ESOPHAGOGASTRODUODENOSCOPY (EGD);  Surgeon: Virgel Manifold, MD;  Location: [facility] ENDOSCOPY;  Service: Endoscopy;  Laterality: Left;  . ESOPHAGOGASTRODUODENOSCOPY (EGD) WITH PROPOFOL N/A 08/31/2018   Procedure:  ESOPHAGOGASTRODUODENOSCOPY (EGD) WITH PROPOFOL;  Surgeon: Lucilla Lame, MD;  Location: [facility] ENDOSCOPY;  Service: Endoscopy;  Laterality: N/A;  . GIVENS CAPSULE STUDY N/A 09/07/2018   Procedure: GIVENS CAPSULE STUDY;  Surgeon: Jonathon Bellows, MD;  Location: [facility] ENDOSCOPY;  Service: Gastroenterology;  Laterality: N/A;  . INSERTION OF DIALYSIS CATHETER Left 05/28/2018   Procedure: INSERTION OF DIALYSIS CATHETER ( TUNNELED CATH INSERT );  Surgeon: Katha Cabal, MD;  Location: [facility];  Service: Vascular;  Laterality: Left;  . LIGATION OF ARTERIOVENOUS  FISTULA Left 12/02/2017   Procedure: LIGATION OF ARTERIOVENOUS  FISTULA ( REMOVAL LEFT WRIST FISTULA );  Surgeon: Katha Cabal, MD;  Location: [facility];  Service: Vascular;  Laterality: Left;  . PERIPHERAL VASCULAR CATHETERIZATION Left 06/03/2016   Procedure: A/V Shuntogram/Fistulagram;  Surgeon: Katha Cabal, MD;  Location: [place] [facility];  Service: Cardiovascular;  Laterality: Left;  . PERIPHERAL VASCULAR CATHETERIZATION  06/03/2016   Procedure: Peripheral Vascular Balloon Angioplasty;  Surgeon: Katha Cabal, MD;  Location: [place] [facility];  Service: Cardiovascular;;  . PERIPHERAL VASCULAR THROMBECTOMY Left 11/14/2019   Procedure: PERIPHERAL VASCULAR THROMBECTOMY;  Surgeon: Algernon Huxley, MD;  Location: [place] [facility];  Service: Cardiovascular;  Laterality: Left;  . REVISON OF ARTERIOVENOUS  FISTULA Left 05/28/2018   Procedure: REVISON OF ARTERIOVENOUS FISTULA ( REMOVE ANEURYSM );  Surgeon: Katha Cabal, MD;  Location: [facility];  Service: Vascular;  Laterality: Left;  . UPPER EXTREMITY VENOGRAPHY N/A 04/13/2018   Procedure: UPPER EXTREMITY VENOGRAPHY;  Surgeon: Katha Cabal, MD;  Location: [place] [facility];  Service: Cardiovascular;  Laterality: N/A;    Family History  Problem Relation Age of Onset  . Heart disease Mother   . Kidney disease Paternal Uncle     Allergies  Allergen Reactions  . Dextromethorphan-Guaifenesin Nausea And Vomiting  . Codeine Nausea And Vomiting  . Doxycycline Nausea And Vomiting       Assessment & Plan:   1. ESRD on hemodialysis [facility]) Recommend:  The patient is doing well and currently has adequate dialysis access. The patient's dialysis center is not reporting any access issues. Flow pattern is stable when compared to the prior ultrasound.  The patient should have a duplex ultrasound of the dialysis access in 3 6 months. The patient will follow-up with me in the office after each ultrasound     2. Pain of left upper extremity Based on noninvasive studies done today in addition to recent fistulogram, the pain in the patient's left upper extremity is not related to his fistula. The patient does not have evidence of steal syndrome and his fistula is widely patent. This may be related to arthritis or perhaps from the pressure stretching of the vein. The patient was offered a referral to orthopedics however he does not wish to do so at this time. Otherwise the patient will continue to follow with nephrology for dialysis.  3. Essential hypertension Continue antihypertensive medications as already ordered, these medications have been reviewed and there are no changes at this time.    Current Outpatient Medications on File Prior to Visit  Medication Sig Dispense Refill  . albuterol (PROVENTIL HFA) 108 (90 Base) MCG/ACT inhaler  Inhale 2 puffs into the lungs every 6 (six) hours as needed for wheezing.     Marland Kitchen allopurinol (ZYLOPRIM) 100 MG tablet Take 200 mg by mouth daily.     Marland Kitchen ascorbic acid (VITAMIN C) 500 MG tablet Take 500 mg by mouth daily.    Marland Kitchen  aspirin 81 MG EC tablet Take 81 mg by mouth daily.     . beclomethasone (QVAR) 80 MCG/ACT inhaler Inhale 1 puff into the lungs as needed (for shortness of breath).    . carvedilol (COREG CR) 10 MG 24 hr capsule Take 10 mg by mouth daily.     . carvedilol (COREG) 25 MG tablet Take 25 mg by mouth.     . cinacalcet (SENSIPAR) 60 MG tablet Take 180 mg by mouth daily.     . cyanocobalamin 1000 MCG tablet Take 1,000 mcg daily by mouth.    . felodipine (PLENDIL) 2.5 MG 24 hr tablet Take 2.5 mg by mouth daily.     Marland Kitchen levETIRAcetam (KEPPRA XR) 500 MG 24 hr tablet Take 500 mg by mouth See admin instructions. Take 500 mg twice a day and takes and additional 500 mg on Mon. Wed and Friday after dialysis    . Methoxy PEG-Epoetin Beta (MIRCERA IJ) Mircera    . omeprazole (PRILOSEC) 20 MG capsule Take 20 mg by mouth daily as needed (Heartburn).     . sevelamer carbonate (RENVELA) 800 MG tablet Take 2,400 mg by mouth 3 (three) times daily with meals.     Marland Kitchen VIMPAT 100 MG TABS Take 100 mg by mouth See admin instructions. Take 100 mg twice a day and extra 100 mg Mon. Wed. and Friday after dialysys    . acetaminophen (TYLENOL) 500 MG tablet Take 1 tablet (500 mg total) by mouth every 6 (six) hours as needed for mild pain. (Patient not taking: Reported on 04/03/2020) 180 tablet 0   No current facility-administered medications on file prior to visit.    There are no Patient Instructions on file for this visit. No follow-ups on file.   Kris Hartmann, NP

## 2020-06-28 ENCOUNTER — Ambulatory Visit (INDEPENDENT_AMBULATORY_CARE_PROVIDER_SITE_OTHER): Payer: Medicare PPO | Admitting: Podiatry

## 2020-06-28 ENCOUNTER — Encounter: Payer: Self-pay | Admitting: Podiatry

## 2020-06-28 ENCOUNTER — Other Ambulatory Visit: Payer: Self-pay

## 2020-06-28 DIAGNOSIS — L84 Corns and callosities: Secondary | ICD-10-CM

## 2020-06-28 DIAGNOSIS — Z992 Dependence on renal dialysis: Secondary | ICD-10-CM | POA: Diagnosis not present

## 2020-06-28 DIAGNOSIS — M79676 Pain in unspecified toe(s): Secondary | ICD-10-CM

## 2020-06-28 DIAGNOSIS — B351 Tinea unguium: Secondary | ICD-10-CM | POA: Diagnosis not present

## 2020-06-28 DIAGNOSIS — N186 End stage renal disease: Secondary | ICD-10-CM | POA: Diagnosis not present

## 2020-06-28 NOTE — Progress Notes (Signed)
This patient returns to my office for at risk foot care.  This patient requires this care by a professional since this patient will be at risk due to having  ESRD.  This patient is unable to cut nails himself since the patient cannot reach his nails.These nails are painful walking and wearing shoes. Patient says his calluses have improved with medicine but callus persists This patient presents for at risk foot care today.  General Appearance  Alert, conversant and in no acute stress.  Vascular  Dorsalis pedis and posterior tibial  pulses are palpable  bilaterally.  Capillary return is within normal limits  bilaterally. Temperature is within normal limits  bilaterally.  Neurologic  Senn-Weinstein monofilament wire test within normal limits  bilaterally. Muscle power within normal limits bilaterally.  Nails Thick disfigured discolored nails with subungual debris  from hallux to fifth toes bilaterally. No evidence of bacterial infection or drainage bilaterally.  Orthopedic  No limitations of motion  feet .  No crepitus or effusions noted.  No bony pathology or digital deformities noted.  Skin  normotropic skin with no porokeratosis noted bilaterally.  No signs of infections or ulcers noted.   Symptomatic callus 1,5  B/L.  Painful heel callus  B/L.  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  Consent was obtained for treatment procedures.   Mechanical debridement of nails 1-5  bilaterally performed with a nail nipper.  Filed with dremel without incident. Debride heel callus with dremel tool. Debride porokeratosis sub 5th left foot.   Return office visit  3 months                    Told patient to return for periodic foot care and evaluation due to potential at risk complications.   Gardiner Barefoot DPM

## 2020-07-03 ENCOUNTER — Ambulatory Visit (INDEPENDENT_AMBULATORY_CARE_PROVIDER_SITE_OTHER): Payer: Medicare PPO | Admitting: Nurse Practitioner

## 2020-07-03 ENCOUNTER — Encounter (INDEPENDENT_AMBULATORY_CARE_PROVIDER_SITE_OTHER): Payer: Medicare PPO

## 2020-08-27 ENCOUNTER — Other Ambulatory Visit (INDEPENDENT_AMBULATORY_CARE_PROVIDER_SITE_OTHER): Payer: Self-pay | Admitting: Vascular Surgery

## 2020-08-27 DIAGNOSIS — T829XXS Unspecified complication of cardiac and vascular prosthetic device, implant and graft, sequela: Secondary | ICD-10-CM

## 2020-08-28 ENCOUNTER — Other Ambulatory Visit: Payer: Self-pay

## 2020-08-28 ENCOUNTER — Encounter (INDEPENDENT_AMBULATORY_CARE_PROVIDER_SITE_OTHER): Payer: Self-pay | Admitting: Nurse Practitioner

## 2020-08-28 ENCOUNTER — Ambulatory Visit (INDEPENDENT_AMBULATORY_CARE_PROVIDER_SITE_OTHER): Payer: Medicare PPO | Admitting: Nurse Practitioner

## 2020-08-28 ENCOUNTER — Ambulatory Visit (INDEPENDENT_AMBULATORY_CARE_PROVIDER_SITE_OTHER): Payer: Medicare PPO

## 2020-08-28 VITALS — BP 117/70 | HR 84 | Resp 16 | Wt 157.4 lb

## 2020-08-28 DIAGNOSIS — N186 End stage renal disease: Secondary | ICD-10-CM

## 2020-08-28 DIAGNOSIS — T829XXS Unspecified complication of cardiac and vascular prosthetic device, implant and graft, sequela: Secondary | ICD-10-CM | POA: Diagnosis not present

## 2020-08-28 DIAGNOSIS — Z992 Dependence on renal dialysis: Secondary | ICD-10-CM | POA: Diagnosis not present

## 2020-08-28 DIAGNOSIS — J4520 Mild intermittent asthma, uncomplicated: Secondary | ICD-10-CM

## 2020-08-28 DIAGNOSIS — I10 Essential (primary) hypertension: Secondary | ICD-10-CM | POA: Diagnosis not present

## 2020-08-29 ENCOUNTER — Telehealth (INDEPENDENT_AMBULATORY_CARE_PROVIDER_SITE_OTHER): Payer: Self-pay

## 2020-08-29 NOTE — Telephone Encounter (Signed)
Spoke with Doug Sou regarding the patient and he is scheduled with [name] for a Left arm fistulagram on 09/06/20 with a 6:45 am arrival to the [facility]. Covid testing is on 09/04/20 between 8-1 pm at the [facility]. Pre-procedure instructions were discussed and will be mailed.

## 2020-08-30 ENCOUNTER — Encounter (INDEPENDENT_AMBULATORY_CARE_PROVIDER_SITE_OTHER): Payer: Self-pay | Admitting: Nurse Practitioner

## 2020-08-30 NOTE — Progress Notes (Signed)
Subjective:    Patient ID: ELENA COTHERN, male    DOB: 10-16-1956, 64 y.o.   MRN: [id number] Chief Complaint  Patient presents with  . Follow-up    ref [place]a HDA    The patient returns to the office for follow up regarding problem with the dialysis access. Currently the patient is maintained via a left brachiocephalic AV fistula.  The patient has had multiple failed upper extremity accesses.  The patient notes a significant increase in bleeding time after decannulation.  The patient has also been informed that there is increased recirculation.    The patient denies hand pain or other symptoms consistent with steal phenomena.  No significant arm swelling.  The patient denies redness or swelling at the access site. The patient denies fever or chills at home or while on dialysis.  The patient denies amaurosis fugax or recent TIA symptoms. There are no recent neurological changes noted. The patient denies claudication symptoms or rest pain symptoms. The patient denies history of DVT, PE or superficial thrombophlebitis. The patient denies recent episodes of angina or shortness of breath.   Patient has a flow volume of 2385.  The AV fistula is patent throughout however the flow volume is decreased from the previous volume of 5006.  There are elevated velocities in the mid upper arm.   Review of Systems  Musculoskeletal: Positive for arthralgias.  Hematological: Bruises/bleeds easily.  All other systems reviewed and are negative.      Objective:   Physical Exam Vitals reviewed.  HENT:     Head: Normocephalic.  Cardiovascular:     Rate and Rhythm: Normal rate and regular rhythm.     Pulses: Normal pulses.  Pulmonary:     Effort: Pulmonary effort is normal.  Skin:    General: Skin is warm.  Neurological:     Mental Status: He is alert and oriented to person, place, and time.  Psychiatric:        Mood and Affect: Mood normal.        Behavior: Behavior normal.         Thought Content: Thought content normal.        Judgment: Judgment normal.     BP 117/70 (BP Location: Right Arm)   Pulse 84   Resp 16   Wt 157 lb 6.4 oz (71.4 kg)   BMI 23.93 kg/m   Past Medical History:  Diagnosis Date  . Arthritis    hands  . Asthma    uses inhaler  . Coronary artery disease   . Dialysis patient [facility]) 2012   Monday, Wednesday, Friday  . GERD (gastroesophageal reflux disease)   . Gout of right hand   . Hypertension   . Renal disorder    stage V..on dialysis  . Seizures ([place]) 2019   last seizure 5 years ago. had 20 in one day    Social History   Socioeconomic History  . Marital status: Single    Spouse name: Not on file  . Number of children: Not on file  . Years of education: Not on file  . Highest education level: Not on file  Occupational History  . Not on file  Tobacco Use  . Smoking status: Former Smoker    Types: Cigarettes  . Smokeless tobacco: Former Systems developer    Quit date: 2010  . Tobacco comment: did not inhale!!  Vaping Use  . Vaping Use: Never used  Substance and Sexual Activity  . Alcohol use: No  .  Drug use: No  . Sexual activity: Not Currently  Other Topics Concern  . Not on file  Social History Narrative  . Not on file   Social Determinants of Health   Financial Resource Strain:   . Difficulty of Paying Living Expenses: Not on file  Food Insecurity:   . Worried About Charity fundraiser in the Last Year: Not on file  . Ran Out of Food in the Last Year: Not on file  Transportation Needs:   . Lack of Transportation (Medical): Not on file  . Lack of Transportation (Non-Medical): Not on file  Physical Activity:   . Days of Exercise per Week: Not on file  . Minutes of Exercise per Session: Not on file  Stress:   . Feeling of Stress : Not on file  Social Connections:   . Frequency of Communication with Friends and Family: Not on file  . Frequency of Social Gatherings with Friends and Family: Not on file  . Attends  Religious Services: Not on file  . Active Member of Clubs or Organizations: Not on file  . Attends Archivist Meetings: Not on file  . Marital Status: Not on file  Intimate Partner Violence:   . Fear of Current or Ex-Partner: Not on file  . Emotionally Abused: Not on file  . Physically Abused: Not on file  . Sexually Abused: Not on file    Past Surgical History:  Procedure Laterality Date  . A/V FISTULAGRAM Left 02/24/2017   Procedure: A/V Fistulagram;  Surgeon: Katha Cabal, MD;  Location: [place] [facility];  Service: Cardiovascular;  Laterality: Left;  . A/V FISTULAGRAM Left 07/20/2019   Procedure: A/V FISTULAGRAM;  Surgeon: Algernon Huxley, MD;  Location: [place] [facility];  Service: Cardiovascular;  Laterality: Left;  . A/V FISTULAGRAM Left 04/03/2020   Procedure: A/V FISTULAGRAM;  Surgeon: Algernon Huxley, MD;  Location: [place] [facility];  Service: Cardiovascular;  Laterality: Left;  . A/V SHUNT INTERVENTION N/A 02/24/2017   Procedure: A/V Shunt Intervention;  Surgeon: Katha Cabal, MD;  Location: [place] [facility];  Service: Cardiovascular;  Laterality: N/A;  . A/V SHUNT INTERVENTION N/A 11/14/2019   Procedure: A/V SHUNT INTERVENTION;  Surgeon: Algernon Huxley, MD;  Location: [place] [facility];  Service: Cardiovascular;  Laterality: N/A;  . AV FISTULA PLACEMENT Left   . AV FISTULA PLACEMENT Left 12/02/2017   Procedure: ARTERIOVENOUS (AV) FISTULA CREATION ( BRACHIAL CEPHALIC );  Surgeon: Katha Cabal, MD;  Location: [facility] ORS;  Service: Vascular;  Laterality: Left;  . CARDIAC CATHETERIZATION  2012   no stents  . COLONOSCOPY Left 09/05/2018   Procedure: COLONOSCOPY;  Surgeon: Virgel Manifold, MD;  Location: [facility] ENDOSCOPY;  Service: Endoscopy;  Laterality: Left;  . DIALYSIS/PERMA CATHETER REMOVAL N/A 08/24/2018   Procedure: DIALYSIS/PERMA CATHETER REMOVAL;  Surgeon: Katha Cabal, MD;  Location: [place] [facility];  Service: Cardiovascular;   Laterality: N/A;  . ESOPHAGOGASTRODUODENOSCOPY Left 09/05/2018   Procedure: ESOPHAGOGASTRODUODENOSCOPY (EGD);  Surgeon: Virgel Manifold, MD;  Location: [facility] ENDOSCOPY;  Service: Endoscopy;  Laterality: Left;  . ESOPHAGOGASTRODUODENOSCOPY (EGD) WITH PROPOFOL N/A 08/31/2018   Procedure: ESOPHAGOGASTRODUODENOSCOPY (EGD) WITH PROPOFOL;  Surgeon: Lucilla Lame, MD;  Location: [facility] ENDOSCOPY;  Service: Endoscopy;  Laterality: N/A;  . GIVENS CAPSULE STUDY N/A 09/07/2018   Procedure: GIVENS CAPSULE STUDY;  Surgeon: Jonathon Bellows, MD;  Location: [facility] ENDOSCOPY;  Service: Gastroenterology;  Laterality: N/A;  . INSERTION OF DIALYSIS CATHETER Left 05/28/2018  Procedure: INSERTION OF DIALYSIS CATHETER ( TUNNELED CATH INSERT );  Surgeon: Katha Cabal, MD;  Location: [facility] ORS;  Service: Vascular;  Laterality: Left;  . LIGATION OF ARTERIOVENOUS  FISTULA Left 12/02/2017   Procedure: LIGATION OF ARTERIOVENOUS  FISTULA ( REMOVAL LEFT WRIST FISTULA );  Surgeon: Katha Cabal, MD;  Location: [facility] ORS;  Service: Vascular;  Laterality: Left;  . PERIPHERAL VASCULAR CATHETERIZATION Left 06/03/2016   Procedure: A/V Shuntogram/Fistulagram;  Surgeon: Katha Cabal, MD;  Location: [place] [facility];  Service: Cardiovascular;  Laterality: Left;  . PERIPHERAL VASCULAR CATHETERIZATION  06/03/2016   Procedure: Peripheral Vascular Balloon Angioplasty;  Surgeon: Katha Cabal, MD;  Location: [place] [facility] LAB;  Service: Cardiovascular;;  . PERIPHERAL VASCULAR THROMBECTOMY Left 11/14/2019   Procedure: PERIPHERAL VASCULAR THROMBECTOMY;  Surgeon: Algernon Huxley, MD;  Location: [place] [facility];  Service: Cardiovascular;  Laterality: Left;  . REVISON OF ARTERIOVENOUS FISTULA Left 05/28/2018   Procedure: REVISON OF ARTERIOVENOUS FISTULA ( REMOVE ANEURYSM );  Surgeon: Katha Cabal, MD;  Location: [facility] ORS;  Service: Vascular;  Laterality: Left;  . UPPER EXTREMITY VENOGRAPHY N/A 04/13/2018   Procedure: UPPER  EXTREMITY VENOGRAPHY;  Surgeon: Katha Cabal, MD;  Location: [place] [facility];  Service: Cardiovascular;  Laterality: N/A;    Family History  Problem Relation Age of Onset  . Heart disease Mother   . Kidney disease Paternal Uncle     Allergies  Allergen Reactions  . Dextromethorphan-Guaifenesin Nausea And Vomiting  . Codeine Nausea And Vomiting  . Doxycycline Nausea And Vomiting       Assessment & Plan:   1. ESRD on hemodialysis [facility]) Recommend:  The patient is experiencing increasing problems with their dialysis access.  Patient should have a fistulagram with the intention for intervention.  The intention for intervention is to restore appropriate flow and prevent thrombosis and possible loss of the access.  As well as improve the quality of dialysis therapy.  The risks, benefits and alternative therapies were reviewed in detail with the patient.  All questions were answered.  The patient agrees to proceed with angio/intervention.      2. Primary hypertension Continue antihypertensive medications as already ordered, these medications have been reviewed and there are no changes at this time.   3. Mild intermittent asthma, uncomplicated Continue pulmonary medications and aerosols as already ordered, these medications have been reviewed and there are no changes at this time.     Current Outpatient Medications on File Prior to Visit  Medication Sig Dispense Refill  . acetaminophen (TYLENOL) 500 MG tablet Take 1 tablet (500 mg total) by mouth every 6 (six) hours as needed for mild pain. 180 tablet 0  . albuterol (PROVENTIL HFA) 108 (90 Base) MCG/ACT inhaler Inhale 2 puffs into the lungs every 6 (six) hours as needed for wheezing.     Marland Kitchen allopurinol (ZYLOPRIM) 100 MG tablet Take 200 mg by mouth daily.     Marland Kitchen ascorbic acid (VITAMIN C) 500 MG tablet Take 500 mg by mouth daily.    Marland Kitchen aspirin 81 MG EC tablet Take 81 mg by mouth daily.     . beclomethasone (QVAR) 80  MCG/ACT inhaler Inhale 1 puff into the lungs as needed (for shortness of breath).    . carvedilol (COREG CR) 10 MG 24 hr capsule Take 10 mg by mouth daily.     . carvedilol (COREG) 25 MG tablet Take 25 mg by mouth.     . cinacalcet (  SENSIPAR) 60 MG tablet Take 180 mg by mouth daily.     . cyanocobalamin 1000 MCG tablet Take 1,000 mcg daily by mouth.    . felodipine (PLENDIL) 2.5 MG 24 hr tablet Take 2.5 mg by mouth daily.     Marland Kitchen levETIRAcetam (KEPPRA XR) 500 MG 24 hr tablet Take 500 mg by mouth See admin instructions. Take 500 mg twice a day and takes and additional 500 mg on Mon. Wed and Friday after dialysis    . Methoxy PEG-Epoetin Beta (MIRCERA IJ) Mircera    . omeprazole (PRILOSEC) 20 MG capsule Take 20 mg by mouth daily as needed (Heartburn).     . sevelamer carbonate (RENVELA) 800 MG tablet Take 2,400 mg by mouth 3 (three) times daily with meals.     Marland Kitchen VIMPAT 100 MG TABS Take 100 mg by mouth See admin instructions. Take 100 mg twice a day and extra 100 mg Mon. Wed. and Friday after dialysys     No current facility-administered medications on file prior to visit.    There are no Patient Instructions on file for this visit. No follow-ups on file.   Kris Hartmann, NP

## 2020-08-30 NOTE — H&P (View-Only) (Signed)
Subjective:    Patient ID: Jeremy Rose, male    DOB: 28-Feb-1956, 64 y.o.   MRN: [id number] Chief Complaint  Patient presents with  . Follow-up    ref [place]a HDA    The patient returns to the office for follow up regarding problem with the dialysis access. Currently the patient is maintained via a left brachiocephalic AV fistula.  The patient has had multiple failed upper extremity accesses.  The patient notes a significant increase in bleeding time after decannulation.  The patient has also been informed that there is increased recirculation.    The patient denies hand pain or other symptoms consistent with steal phenomena.  No significant arm swelling.  The patient denies redness or swelling at the access site. The patient denies fever or chills at home or while on dialysis.  The patient denies amaurosis fugax or recent TIA symptoms. There are no recent neurological changes noted. The patient denies claudication symptoms or rest pain symptoms. The patient denies history of DVT, PE or superficial thrombophlebitis. The patient denies recent episodes of angina or shortness of breath.   Patient has a flow volume of 2385.  The AV fistula is patent throughout however the flow volume is decreased from the previous volume of 5006.  There are elevated velocities in the mid upper arm.   Review of Systems  Musculoskeletal: Positive for arthralgias.  Hematological: Bruises/bleeds easily.  All other systems reviewed and are negative.      Objective:   Physical Exam Vitals reviewed.  HENT:     Head: Normocephalic.  Cardiovascular:     Rate and Rhythm: Normal rate and regular rhythm.     Pulses: Normal pulses.  Pulmonary:     Effort: Pulmonary effort is normal.  Skin:    General: Skin is warm.  Neurological:     Mental Status: He is alert and oriented to person, place, and time.  Psychiatric:        Mood and Affect: Mood normal.        Behavior: Behavior normal.         Thought Content: Thought content normal.        Judgment: Judgment normal.     BP 117/70 (BP Location: Right Arm)   Pulse 84   Resp 16   Wt 157 lb 6.4 oz (71.4 kg)   BMI 23.93 kg/m   Past Medical History:  Diagnosis Date  . Arthritis    hands  . Asthma    uses inhaler  . Coronary artery disease   . Dialysis patient [facility]) 2012   Monday, Wednesday, Friday  . GERD (gastroesophageal reflux disease)   . Gout of right hand   . Hypertension   . Renal disorder    stage V..on dialysis  . Seizures ([place]) 2019   last seizure 5 years ago. had 20 in one day    Social History   Socioeconomic History  . Marital status: Single    Spouse name: Not on file  . Number of children: Not on file  . Years of education: Not on file  . Highest education level: Not on file  Occupational History  . Not on file  Tobacco Use  . Smoking status: Former Smoker    Types: Cigarettes  . Smokeless tobacco: Former Systems developer    Quit date: 2010  . Tobacco comment: did not inhale!!  Vaping Use  . Vaping Use: Never used  Substance and Sexual Activity  . Alcohol use: No  .  Drug use: No  . Sexual activity: Not Currently  Other Topics Concern  . Not on file  Social History Narrative  . Not on file   Social Determinants of Health   Financial Resource Strain:   . Difficulty of Paying Living Expenses: Not on file  Food Insecurity:   . Worried About Charity fundraiser in the Last Year: Not on file  . Ran Out of Food in the Last Year: Not on file  Transportation Needs:   . Lack of Transportation (Medical): Not on file  . Lack of Transportation (Non-Medical): Not on file  Physical Activity:   . Days of Exercise per Week: Not on file  . Minutes of Exercise per Session: Not on file  Stress:   . Feeling of Stress : Not on file  Social Connections:   . Frequency of Communication with Friends and Family: Not on file  . Frequency of Social Gatherings with Friends and Family: Not on file  . Attends  Religious Services: Not on file  . Active Member of Clubs or Organizations: Not on file  . Attends Archivist Meetings: Not on file  . Marital Status: Not on file  Intimate Partner Violence:   . Fear of Current or Ex-Partner: Not on file  . Emotionally Abused: Not on file  . Physically Abused: Not on file  . Sexually Abused: Not on file    Past Surgical History:  Procedure Laterality Date  . A/V FISTULAGRAM Left 02/24/2017   Procedure: A/V Fistulagram;  Surgeon: Katha Cabal, MD;  Location: [place] [facility];  Service: Cardiovascular;  Laterality: Left;  . A/V FISTULAGRAM Left 07/20/2019   Procedure: A/V FISTULAGRAM;  Surgeon: Algernon Huxley, MD;  Location: [place] [facility];  Service: Cardiovascular;  Laterality: Left;  . A/V FISTULAGRAM Left 04/03/2020   Procedure: A/V FISTULAGRAM;  Surgeon: Algernon Huxley, MD;  Location: [place] [facility];  Service: Cardiovascular;  Laterality: Left;  . A/V SHUNT INTERVENTION N/A 02/24/2017   Procedure: A/V Shunt Intervention;  Surgeon: Katha Cabal, MD;  Location: [place] [facility];  Service: Cardiovascular;  Laterality: N/A;  . A/V SHUNT INTERVENTION N/A 11/14/2019   Procedure: A/V SHUNT INTERVENTION;  Surgeon: Algernon Huxley, MD;  Location: [place] [facility];  Service: Cardiovascular;  Laterality: N/A;  . AV FISTULA PLACEMENT Left   . AV FISTULA PLACEMENT Left 12/02/2017   Procedure: ARTERIOVENOUS (AV) FISTULA CREATION ( BRACHIAL CEPHALIC );  Surgeon: Katha Cabal, MD;  Location: [facility] ORS;  Service: Vascular;  Laterality: Left;  . CARDIAC CATHETERIZATION  2012   no stents  . COLONOSCOPY Left 09/05/2018   Procedure: COLONOSCOPY;  Surgeon: Virgel Manifold, MD;  Location: [facility] ENDOSCOPY;  Service: Endoscopy;  Laterality: Left;  . DIALYSIS/PERMA CATHETER REMOVAL N/A 08/24/2018   Procedure: DIALYSIS/PERMA CATHETER REMOVAL;  Surgeon: Katha Cabal, MD;  Location: [place] [facility];  Service: Cardiovascular;   Laterality: N/A;  . ESOPHAGOGASTRODUODENOSCOPY Left 09/05/2018   Procedure: ESOPHAGOGASTRODUODENOSCOPY (EGD);  Surgeon: Virgel Manifold, MD;  Location: [facility] ENDOSCOPY;  Service: Endoscopy;  Laterality: Left;  . ESOPHAGOGASTRODUODENOSCOPY (EGD) WITH PROPOFOL N/A 08/31/2018   Procedure: ESOPHAGOGASTRODUODENOSCOPY (EGD) WITH PROPOFOL;  Surgeon: Lucilla Lame, MD;  Location: [facility] ENDOSCOPY;  Service: Endoscopy;  Laterality: N/A;  . GIVENS CAPSULE STUDY N/A 09/07/2018   Procedure: GIVENS CAPSULE STUDY;  Surgeon: Jonathon Bellows, MD;  Location: [facility] ENDOSCOPY;  Service: Gastroenterology;  Laterality: N/A;  . INSERTION OF DIALYSIS CATHETER Left 05/28/2018  Procedure: INSERTION OF DIALYSIS CATHETER ( TUNNELED CATH INSERT );  Surgeon: Katha Cabal, MD;  Location: [facility] ORS;  Service: Vascular;  Laterality: Left;  . LIGATION OF ARTERIOVENOUS  FISTULA Left 12/02/2017   Procedure: LIGATION OF ARTERIOVENOUS  FISTULA ( REMOVAL LEFT WRIST FISTULA );  Surgeon: Katha Cabal, MD;  Location: [facility] ORS;  Service: Vascular;  Laterality: Left;  . PERIPHERAL VASCULAR CATHETERIZATION Left 06/03/2016   Procedure: A/V Shuntogram/Fistulagram;  Surgeon: Katha Cabal, MD;  Location: [place] [facility];  Service: Cardiovascular;  Laterality: Left;  . PERIPHERAL VASCULAR CATHETERIZATION  06/03/2016   Procedure: Peripheral Vascular Balloon Angioplasty;  Surgeon: Katha Cabal, MD;  Location: [place] [facility];  Service: Cardiovascular;;  . PERIPHERAL VASCULAR THROMBECTOMY Left 11/14/2019   Procedure: PERIPHERAL VASCULAR THROMBECTOMY;  Surgeon: Algernon Huxley, MD;  Location: [place] [facility];  Service: Cardiovascular;  Laterality: Left;  . REVISON OF ARTERIOVENOUS FISTULA Left 05/28/2018   Procedure: REVISON OF ARTERIOVENOUS FISTULA ( REMOVE ANEURYSM );  Surgeon: Katha Cabal, MD;  Location: [facility] ORS;  Service: Vascular;  Laterality: Left;  . UPPER EXTREMITY VENOGRAPHY N/A 04/13/2018   Procedure: UPPER  EXTREMITY VENOGRAPHY;  Surgeon: Katha Cabal, MD;  Location: [place] [facility];  Service: Cardiovascular;  Laterality: N/A;    Family History  Problem Relation Age of Onset  . Heart disease Mother   . Kidney disease Paternal Uncle     Allergies  Allergen Reactions  . Dextromethorphan-Guaifenesin Nausea And Vomiting  . Codeine Nausea And Vomiting  . Doxycycline Nausea And Vomiting       Assessment & Plan:   1. ESRD on hemodialysis [facility]) Recommend:  The patient is experiencing increasing problems with their dialysis access.  Patient should have a fistulagram with the intention for intervention.  The intention for intervention is to restore appropriate flow and prevent thrombosis and possible loss of the access.  As well as improve the quality of dialysis therapy.  The risks, benefits and alternative therapies were reviewed in detail with the patient.  All questions were answered.  The patient agrees to proceed with angio/intervention.      2. Primary hypertension Continue antihypertensive medications as already ordered, these medications have been reviewed and there are no changes at this time.   3. Mild intermittent asthma, uncomplicated Continue pulmonary medications and aerosols as already ordered, these medications have been reviewed and there are no changes at this time.     Current Outpatient Medications on File Prior to Visit  Medication Sig Dispense Refill  . acetaminophen (TYLENOL) 500 MG tablet Take 1 tablet (500 mg total) by mouth every 6 (six) hours as needed for mild pain. 180 tablet 0  . albuterol (PROVENTIL HFA) 108 (90 Base) MCG/ACT inhaler Inhale 2 puffs into the lungs every 6 (six) hours as needed for wheezing.     Marland Kitchen allopurinol (ZYLOPRIM) 100 MG tablet Take 200 mg by mouth daily.     Marland Kitchen ascorbic acid (VITAMIN C) 500 MG tablet Take 500 mg by mouth daily.    Marland Kitchen aspirin 81 MG EC tablet Take 81 mg by mouth daily.     . beclomethasone (QVAR) 80  MCG/ACT inhaler Inhale 1 puff into the lungs as needed (for shortness of breath).    . carvedilol (COREG CR) 10 MG 24 hr capsule Take 10 mg by mouth daily.     . carvedilol (COREG) 25 MG tablet Take 25 mg by mouth.     . cinacalcet (  SENSIPAR) 60 MG tablet Take 180 mg by mouth daily.     . cyanocobalamin 1000 MCG tablet Take 1,000 mcg daily by mouth.    . felodipine (PLENDIL) 2.5 MG 24 hr tablet Take 2.5 mg by mouth daily.     Marland Kitchen levETIRAcetam (KEPPRA XR) 500 MG 24 hr tablet Take 500 mg by mouth See admin instructions. Take 500 mg twice a day and takes and additional 500 mg on Mon. Wed and Friday after dialysis    . Methoxy PEG-Epoetin Beta (MIRCERA IJ) Mircera    . omeprazole (PRILOSEC) 20 MG capsule Take 20 mg by mouth daily as needed (Heartburn).     . sevelamer carbonate (RENVELA) 800 MG tablet Take 2,400 mg by mouth 3 (three) times daily with meals.     Marland Kitchen VIMPAT 100 MG TABS Take 100 mg by mouth See admin instructions. Take 100 mg twice a day and extra 100 mg Mon. Wed. and Friday after dialysys     No current facility-administered medications on file prior to visit.    There are no Patient Instructions on file for this visit. No follow-ups on file.   Kris Hartmann, NP

## 2020-09-04 ENCOUNTER — Other Ambulatory Visit
Admission: RE | Admit: 2020-09-04 | Discharge: 2020-09-04 | Disposition: A | Discharge status: Home / Self Care | Payer: Medicare PPO | Source: Ambulatory Visit | Attending: Vascular Surgery | Admitting: Vascular Surgery

## 2020-09-04 DIAGNOSIS — Z20822 Contact with and (suspected) exposure to covid-19: Secondary | ICD-10-CM | POA: Insufficient documentation

## 2020-09-04 DIAGNOSIS — Z01812 Encounter for preprocedural laboratory examination: Secondary | ICD-10-CM | POA: Diagnosis present

## 2020-09-04 LAB — SARS CORONAVIRUS 2 (TAT 6-24 HRS): SARS Coronavirus 2: NEGATIVE

## 2020-09-06 ENCOUNTER — Ambulatory Visit
Admission: RE | Admit: 2020-09-06 | Discharge: 2020-09-06 | Disposition: A | Discharge status: Home / Self Care | Payer: Medicare PPO | Attending: Vascular Surgery | Admitting: Vascular Surgery

## 2020-09-06 ENCOUNTER — Encounter: Payer: Self-pay | Admitting: Vascular Surgery

## 2020-09-06 ENCOUNTER — Other Ambulatory Visit (INDEPENDENT_AMBULATORY_CARE_PROVIDER_SITE_OTHER): Payer: Self-pay | Admitting: Nurse Practitioner

## 2020-09-06 ENCOUNTER — Other Ambulatory Visit: Payer: Self-pay

## 2020-09-06 ENCOUNTER — Encounter
Admission: RE | Disposition: A | Discharge status: Home / Self Care | Payer: Self-pay | Source: Home / Self Care | Attending: Vascular Surgery

## 2020-09-06 DIAGNOSIS — I120 Hypertensive chronic kidney disease with stage 5 chronic kidney disease or end stage renal disease: Secondary | ICD-10-CM | POA: Insufficient documentation

## 2020-09-06 DIAGNOSIS — Z87891 Personal history of nicotine dependence: Secondary | ICD-10-CM | POA: Diagnosis not present

## 2020-09-06 DIAGNOSIS — I2510 Atherosclerotic heart disease of native coronary artery without angina pectoris: Secondary | ICD-10-CM | POA: Insufficient documentation

## 2020-09-06 DIAGNOSIS — M109 Gout, unspecified: Secondary | ICD-10-CM | POA: Diagnosis not present

## 2020-09-06 DIAGNOSIS — T82858A Stenosis of vascular prosthetic devices, implants and grafts, initial encounter: Secondary | ICD-10-CM | POA: Insufficient documentation

## 2020-09-06 DIAGNOSIS — Z992 Dependence on renal dialysis: Secondary | ICD-10-CM | POA: Insufficient documentation

## 2020-09-06 DIAGNOSIS — N186 End stage renal disease: Secondary | ICD-10-CM

## 2020-09-06 DIAGNOSIS — K219 Gastro-esophageal reflux disease without esophagitis: Secondary | ICD-10-CM | POA: Diagnosis not present

## 2020-09-06 DIAGNOSIS — T82898A Other specified complication of vascular prosthetic devices, implants and grafts, initial encounter: Secondary | ICD-10-CM

## 2020-09-06 DIAGNOSIS — Y841 Kidney dialysis as the cause of abnormal reaction of the patient, or of later complication, without mention of misadventure at the time of the procedure: Secondary | ICD-10-CM | POA: Diagnosis not present

## 2020-09-06 DIAGNOSIS — J45909 Unspecified asthma, uncomplicated: Secondary | ICD-10-CM | POA: Insufficient documentation

## 2020-09-06 DIAGNOSIS — Z79899 Other long term (current) drug therapy: Secondary | ICD-10-CM | POA: Diagnosis not present

## 2020-09-06 DIAGNOSIS — Z7982 Long term (current) use of aspirin: Secondary | ICD-10-CM | POA: Diagnosis not present

## 2020-09-06 DIAGNOSIS — R569 Unspecified convulsions: Secondary | ICD-10-CM | POA: Diagnosis not present

## 2020-09-06 HISTORY — PX: A/V FISTULAGRAM: CATH118298

## 2020-09-06 LAB — POTASSIUM (ARMC VASCULAR LAB ONLY): Potassium (ARMC vascular lab): 4 (ref 3.5–5.1)

## 2020-09-06 SURGERY — A/V FISTULAGRAM
Anesthesia: Moderate Sedation | Laterality: Left

## 2020-09-06 MED ORDER — HEPARIN SODIUM (PORCINE) 1000 UNIT/ML IJ SOLN
INTRAMUSCULAR | Status: DC | PRN
  Administered 2020-09-06: 3000 [IU] via INTRAVENOUS

## 2020-09-06 MED ORDER — FENTANYL CITRATE (PF) 100 MCG/2ML IJ SOLN
INTRAMUSCULAR | Status: AC
  Filled 2020-09-06: qty 2

## 2020-09-06 MED ORDER — FENTANYL CITRATE (PF) 100 MCG/2ML IJ SOLN: 12.5000 ug | Freq: Once | INTRAMUSCULAR | Status: DC | PRN

## 2020-09-06 MED ORDER — FAMOTIDINE 20 MG PO TABS: 40.0000 mg | ORAL_TABLET | Freq: Once | ORAL | Status: DC | PRN

## 2020-09-06 MED ORDER — MIDAZOLAM HCL 5 MG/5ML IJ SOLN
INTRAMUSCULAR | Status: AC
  Filled 2020-09-06: qty 5

## 2020-09-06 MED ORDER — FENTANYL CITRATE (PF) 100 MCG/2ML IJ SOLN
INTRAMUSCULAR | Status: DC | PRN
Start: 2020-09-06 — End: 2020-09-06
  Administered 2020-09-06: 50 ug via INTRAVENOUS
  Administered 2020-09-06: 25 ug via INTRAVENOUS

## 2020-09-06 MED ORDER — CEFAZOLIN SODIUM-DEXTROSE 1-4 GM/50ML-% IV SOLN
1.0000 g | Freq: Once | INTRAVENOUS | Status: AC
  Administered 2020-09-06: 1 g via INTRAVENOUS

## 2020-09-06 MED ORDER — METHYLPREDNISOLONE SODIUM SUCC 125 MG IJ SOLR: 125.0000 mg | Freq: Once | INTRAMUSCULAR | Status: DC | PRN

## 2020-09-06 MED ORDER — ONDANSETRON HCL 4 MG/2ML IJ SOLN: 4.0000 mg | Freq: Four times a day (QID) | INTRAMUSCULAR | Status: DC | PRN

## 2020-09-06 MED ORDER — MIDAZOLAM HCL 2 MG/ML PO SYRP: 8.0000 mg | ORAL_SOLUTION | Freq: Once | ORAL | Status: DC | PRN

## 2020-09-06 MED ORDER — SODIUM CHLORIDE 0.9 % IV SOLN: INTRAVENOUS | Status: DC

## 2020-09-06 MED ORDER — IODIXANOL 320 MG/ML IV SOLN
INTRAVENOUS | Status: DC | PRN
  Administered 2020-09-06: 27 mL

## 2020-09-06 MED ORDER — DIPHENHYDRAMINE HCL 50 MG/ML IJ SOLN: 50.0000 mg | Freq: Once | INTRAMUSCULAR | Status: DC | PRN

## 2020-09-06 MED ORDER — MIDAZOLAM HCL 2 MG/2ML IJ SOLN
INTRAMUSCULAR | Status: DC | PRN
  Administered 2020-09-06: 1 mg via INTRAVENOUS
  Administered 2020-09-06: 2 mg via INTRAVENOUS

## 2020-09-06 MED ORDER — HEPARIN SODIUM (PORCINE) 1000 UNIT/ML IJ SOLN
INTRAMUSCULAR | Status: AC
  Filled 2020-09-06: qty 1

## 2020-09-06 SURGICAL SUPPLY — 8 items
BALLN LUTONIX AV 8X60X75 (BALLOONS) ×2
BALLOON LUTONIX AV 8X60X75 (BALLOONS) ×1 IMPLANT
CANNULA 5F STIFF (CANNULA) ×2 IMPLANT
KIT ENCORE 26 ADVANTAGE (KITS) ×2 IMPLANT
PACK ANGIOGRAPHY (CUSTOM PROCEDURE TRAY) ×2 IMPLANT
SET INTRO CAPELLA COAXIAL (SET/KITS/TRAYS/PACK) ×2 IMPLANT
SHEATH BRITE TIP 6FRX5.5 (SHEATH) ×2 IMPLANT
WIRE MAGIC TOR.035 180C (WIRE) ×2 IMPLANT

## 2020-09-06 NOTE — Op Note (Signed)
[facility]    OPERATIVE NOTE   PROCEDURE: 1.   Left brachiocephalic arteriovenous fistula/Artegraft jump graft cannulation under ultrasound guidance 2.   Left arm fistulagram including central venogram 3.   Percutaneous transluminal angioplasty of the midportion of the jump graft with 8 mm diameter by 6 cm length Lutonix drug-coated angioplasty balloon  PRE-OPERATIVE DIAGNOSIS: 1. ESRD 2. Poorly functional left brachiocephalic AVF and Artegraft jump graft  POST-OPERATIVE DIAGNOSIS: same as above   SURGEON: [name], MD  ANESTHESIA: local with MCS  ESTIMATED BLOOD LOSS: 5 cc  FINDING(S): 1. Narrowing in the midportion of the Artegraft jump graft creating a 60 to 65% stenosis.  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  The central venous circulation was widely patent.  SPECIMEN(S):  None  CONTRAST: 27 cc  FLUORO TIME: 1.1 minutes  MODERATE CONSCIOUS SEDATION TIME: Approximately 33 minutes with 3 mg of Versed and 75 mcg of Fentanyl   INDICATIONS: Jeremy Rose is a 64 y.o. male who presents with malfunctioning left brachiocephalic arteriovenous fistula that has had a previous Artegraft jump graft.  The patient is scheduled for left arm fistulagram.  The patient is aware the risks include but are not limited to: bleeding, infection, thrombosis of the cannulated access, and possible anaphylactic reaction to the contrast.  The patient is aware of the risks of the procedure and elects to proceed forward.  DESCRIPTION: After full informed written consent was obtained, the patient was brought back to the angiography suite and placed supine upon the angiography table.  The patient was connected to monitoring equipment. Moderate conscious sedation was administered with a face to face encounter with the patient throughout the procedure with my supervision of the RN administering medicines and monitoring the patient's vital  signs and mental status throughout from the start of the procedure until the patient was taken to the recovery room. The left arm was prepped and draped in the standard fashion for a percutaneous access intervention.  Under ultrasound guidance, the left brachiocephalic arteriovenous fistula was cannulated with a micropuncture needle under direct ultrasound guidance where it was patent and a permanent image was performed.  The microwire was advanced into the fistula and the needle was exchanged for the a microsheath.  I then upsized to a 6 Fr Sheath and imaging was performed.  Hand injections were completed to image the access including the central venous system. This demonstrated narrowing in the midportion of the Artegraft jump graft creating a 60 to 65% stenosis.  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  The central venous circulation was widely patent.  Based on the images, this patient will need intervention to the midportion of the Artegraft jump graft. I then gave the patient 3000 units of intravenous heparin.  I then crossed the stenosis with a Magic Tourqe wire.  Based on the imaging, a 8 mm x 6 cm Lutonix drug-coated angioplasty balloon was selected.  The balloon was centered around the stenosis and inflated to 14 ATM for 1 minute(s).  On completion imaging, a 15-20% residual stenosis was present.     Based on the completion imaging, no further intervention is necessary.  The wire and balloon were removed from the sheath.  A 4-0 Monocryl purse-string suture was sewn around the sheath.  The sheath was removed while tying down the suture.  A sterile bandage was applied to the puncture site.  COMPLICATIONS: None  CONDITION:  Stable   [name]  09/06/2020 10:17 AM   This note was created with Dragon Medical transcription system. Any errors in dictation are purely unintentional.

## 2020-09-06 NOTE — Interval H&P Note (Signed)
History and Physical Interval Note:  09/06/2020 8:34 AM  Jeremy Rose  has presented today for surgery, with the diagnosis of LT Arm Fistulagram   ESRD   Pt to have Covid test on 09-04-20.  The various methods of treatment have been discussed with the patient and family. After consideration of risks, benefits and other options for treatment, the patient has consented to  Procedure(s): A/V FISTULAGRAM (Left) as a surgical intervention.  The patient's history has been reviewed, patient examined, no change in status, stable for surgery.  I have reviewed the patient's chart and labs.  Questions were answered to the patient's satisfaction.     [name]

## 2020-10-04 ENCOUNTER — Ambulatory Visit: Payer: Medicare PPO | Admitting: Podiatry

## 2020-10-19 ENCOUNTER — Other Ambulatory Visit (INDEPENDENT_AMBULATORY_CARE_PROVIDER_SITE_OTHER): Payer: Self-pay | Admitting: Vascular Surgery

## 2020-10-19 DIAGNOSIS — Z9862 Peripheral vascular angioplasty status: Secondary | ICD-10-CM

## 2020-10-19 DIAGNOSIS — T829XXS Unspecified complication of cardiac and vascular prosthetic device, implant and graft, sequela: Secondary | ICD-10-CM

## 2020-10-19 DIAGNOSIS — N186 End stage renal disease: Secondary | ICD-10-CM

## 2020-10-23 ENCOUNTER — Other Ambulatory Visit: Payer: Self-pay

## 2020-10-23 ENCOUNTER — Ambulatory Visit (INDEPENDENT_AMBULATORY_CARE_PROVIDER_SITE_OTHER): Payer: Medicare PPO

## 2020-10-23 ENCOUNTER — Ambulatory Visit (INDEPENDENT_AMBULATORY_CARE_PROVIDER_SITE_OTHER): Payer: Medicare PPO | Admitting: Nurse Practitioner

## 2020-10-23 ENCOUNTER — Encounter (INDEPENDENT_AMBULATORY_CARE_PROVIDER_SITE_OTHER): Payer: Self-pay | Admitting: Nurse Practitioner

## 2020-10-23 VITALS — BP 124/73 | HR 70 | Resp 16 | Wt 154.8 lb

## 2020-10-23 DIAGNOSIS — T829XXS Unspecified complication of cardiac and vascular prosthetic device, implant and graft, sequela: Secondary | ICD-10-CM | POA: Diagnosis not present

## 2020-10-23 DIAGNOSIS — N186 End stage renal disease: Secondary | ICD-10-CM

## 2020-10-23 DIAGNOSIS — I10 Essential (primary) hypertension: Secondary | ICD-10-CM | POA: Diagnosis not present

## 2020-10-23 DIAGNOSIS — R2232 Localized swelling, mass and lump, left upper limb: Secondary | ICD-10-CM | POA: Diagnosis not present

## 2020-10-23 DIAGNOSIS — Z9862 Peripheral vascular angioplasty status: Secondary | ICD-10-CM | POA: Diagnosis not present

## 2020-10-28 ENCOUNTER — Encounter (INDEPENDENT_AMBULATORY_CARE_PROVIDER_SITE_OTHER): Payer: Self-pay | Admitting: Nurse Practitioner

## 2020-10-28 NOTE — Progress Notes (Signed)
Subjective:    Patient ID: Jeremy Rose, male    DOB: 02-09-1956, 64 y.o.   MRN: [id number] Chief Complaint  Patient presents with  . Follow-up    [facility] 6wk post av fistulagram    The patient returns to the office for followup status post intervention of the dialysis access left brachiocephalic AV fistula.  Patient intervention occurred on 09/06/2020:  PROCEDURE: 1.   Left brachiocephalic arteriovenous fistula/Artegraft jump graft cannulation under ultrasound guidance 2.   Left arm fistulagram including central venogram 3.   Percutaneous transluminal angioplasty of the midportion of the jump graft with 8 mm diameter by 6 cm length Lutonix drug-coated angioplasty balloon   Following the intervention the access function has significantly improved, with better flow rates and improved KT/V. The patient has not been experiencing increased bleeding times following decannulation and the patient denies increased recirculation. The patient denies an increase in arm swelling. At the present time the patient denies hand pain.  The patient denies amaurosis fugax or recent TIA symptoms. There are no recent neurological changes noted. The patient denies claudication symptoms or rest pain symptoms. The patient denies history of DVT, PE or superficial thrombophlebitis. The patient denies recent episodes of angina or shortness of breath.   Today the patient has a flow volume of 1919.  This is slightly decreased from previous flow volume on 08/28/2020.  However there are no areas of significant stenosis.        Review of Systems  Hematological: Bruises/bleeds easily.  All other systems reviewed and are negative.      Objective:   Physical Exam Vitals reviewed.  HENT:     Head: Normocephalic.  Cardiovascular:     Rate and Rhythm: Normal rate.     Pulses: Normal pulses.  Pulmonary:     Effort: Pulmonary effort is normal.  Neurological:     Mental Status: He is alert and oriented  to person, place, and time.  Psychiatric:        Mood and Affect: Mood normal.        Behavior: Behavior normal.        Thought Content: Thought content normal.        Judgment: Judgment normal.     BP 124/73 (BP Location: Right Arm)   Pulse 70   Resp 16   Wt 154 lb 12.8 oz (70.2 kg)   BMI 23.54 kg/m   Past Medical History:  Diagnosis Date  . Arthritis    hands  . Asthma    uses inhaler  . Coronary artery disease   . Dialysis patient [facility]) 2012   Monday, Wednesday, Friday  . GERD (gastroesophageal reflux disease)   . Gout of right hand   . Hypertension   . Renal disorder    stage V..on dialysis  . Seizures ([place]) 2019   last seizure 5 years ago. had 20 in one day    Social History   Socioeconomic History  . Marital status: Single    Spouse name: Not on file  . Number of children: 0  . Years of education: Not on file  . Highest education level: Not on file  Occupational History  . Occupation: disabled  Tobacco Use  . Smoking status: Former Smoker    Types: Cigarettes  . Smokeless tobacco: Former Systems developer    Quit date: 2010  . Tobacco comment: did not inhale!!  Vaping Use  . Vaping Use: Never used  Substance and Sexual Activity  . Alcohol  use: No  . Drug use: No  . Sexual activity: Not Currently  Other Topics Concern  . Not on file  Social History Narrative   Lives by himself with close neighbors who help him    Social Determinants of Health   Financial Resource Strain: Not on file  Food Insecurity: Not on file  Transportation Needs: Not on file  Physical Activity: Not on file  Stress: Not on file  Social Connections: Not on file  Intimate Partner Violence: Not on file    Past Surgical History:  Procedure Laterality Date  . A/V FISTULAGRAM Left 02/24/2017   Procedure: A/V Fistulagram;  Surgeon: Katha Cabal, MD;  Location: [place] [facility];  Service: Cardiovascular;  Laterality: Left;  . A/V FISTULAGRAM Left 07/20/2019   Procedure: A/V  FISTULAGRAM;  Surgeon: Algernon Huxley, MD;  Location: [place] [facility];  Service: Cardiovascular;  Laterality: Left;  . A/V FISTULAGRAM Left 04/03/2020   Procedure: A/V FISTULAGRAM;  Surgeon: Algernon Huxley, MD;  Location: [place] [facility];  Service: Cardiovascular;  Laterality: Left;  . A/V FISTULAGRAM Left 09/06/2020   Procedure: A/V FISTULAGRAM;  Surgeon: Algernon Huxley, MD;  Location: [place] [facility];  Service: Cardiovascular;  Laterality: Left;  . A/V SHUNT INTERVENTION N/A 02/24/2017   Procedure: A/V Shunt Intervention;  Surgeon: Katha Cabal, MD;  Location: [place] [facility];  Service: Cardiovascular;  Laterality: N/A;  . A/V SHUNT INTERVENTION N/A 11/14/2019   Procedure: A/V SHUNT INTERVENTION;  Surgeon: Algernon Huxley, MD;  Location: [place] [facility];  Service: Cardiovascular;  Laterality: N/A;  . AV FISTULA PLACEMENT Left   . AV FISTULA PLACEMENT Left 12/02/2017   Procedure: ARTERIOVENOUS (AV) FISTULA CREATION ( BRACHIAL CEPHALIC );  Surgeon: Katha Cabal, MD;  Location: [facility];  Service: Vascular;  Laterality: Left;  . CARDIAC CATHETERIZATION  2012   no stents  . COLONOSCOPY Left 09/05/2018   Procedure: COLONOSCOPY;  Surgeon: Virgel Manifold, MD;  Location: [facility] ENDOSCOPY;  Service: Endoscopy;  Laterality: Left;  . DIALYSIS/PERMA CATHETER REMOVAL N/A 08/24/2018   Procedure: DIALYSIS/PERMA CATHETER REMOVAL;  Surgeon: Katha Cabal, MD;  Location: [place] [facility];  Service: Cardiovascular;  Laterality: N/A;  . ESOPHAGOGASTRODUODENOSCOPY Left 09/05/2018   Procedure: ESOPHAGOGASTRODUODENOSCOPY (EGD);  Surgeon: Virgel Manifold, MD;  Location: [facility] ENDOSCOPY;  Service: Endoscopy;  Laterality: Left;  . ESOPHAGOGASTRODUODENOSCOPY (EGD) WITH PROPOFOL N/A 08/31/2018   Procedure: ESOPHAGOGASTRODUODENOSCOPY (EGD) WITH PROPOFOL;  Surgeon: Lucilla Lame, MD;  Location: [facility] ENDOSCOPY;  Service: Endoscopy;  Laterality: N/A;  . GIVENS CAPSULE STUDY N/A  09/07/2018   Procedure: GIVENS CAPSULE STUDY;  Surgeon: Jonathon Bellows, MD;  Location: [facility] ENDOSCOPY;  Service: Gastroenterology;  Laterality: N/A;  . INSERTION OF DIALYSIS CATHETER Left 05/28/2018   Procedure: INSERTION OF DIALYSIS CATHETER ( TUNNELED CATH INSERT );  Surgeon: Katha Cabal, MD;  Location: [facility];  Service: Vascular;  Laterality: Left;  . LIGATION OF ARTERIOVENOUS  FISTULA Left 12/02/2017   Procedure: LIGATION OF ARTERIOVENOUS  FISTULA ( REMOVAL LEFT WRIST FISTULA );  Surgeon: Katha Cabal, MD;  Location: [facility];  Service: Vascular;  Laterality: Left;  . PERIPHERAL VASCULAR CATHETERIZATION Left 06/03/2016   Procedure: A/V Shuntogram/Fistulagram;  Surgeon: Katha Cabal, MD;  Location: [place] [facility];  Service: Cardiovascular;  Laterality: Left;  . PERIPHERAL VASCULAR CATHETERIZATION  06/03/2016   Procedure: Peripheral Vascular Balloon Angioplasty;  Surgeon: Katha Cabal, MD;  Location: [place] [facility];  Service: Cardiovascular;;  . PERIPHERAL VASCULAR THROMBECTOMY Left 11/14/2019   Procedure: PERIPHERAL VASCULAR THROMBECTOMY;  Surgeon: Algernon Huxley, MD;  Location: [place] [facility];  Service: Cardiovascular;  Laterality: Left;  . REVISON OF ARTERIOVENOUS FISTULA Left 05/28/2018   Procedure: REVISON OF ARTERIOVENOUS FISTULA ( REMOVE ANEURYSM );  Surgeon: Katha Cabal, MD;  Location: [facility];  Service: Vascular;  Laterality: Left;  . UPPER EXTREMITY VENOGRAPHY N/A 04/13/2018   Procedure: UPPER EXTREMITY VENOGRAPHY;  Surgeon: Katha Cabal, MD;  Location: [place] [facility];  Service: Cardiovascular;  Laterality: N/A;    Family History  Problem Relation Age of Onset  . Heart disease Mother   . Kidney disease Paternal Uncle     Allergies  Allergen Reactions  . Dextromethorphan-Guaifenesin Nausea And Vomiting  . Codeine Nausea And Vomiting  . Doxycycline Nausea And Vomiting    CBC Latest Ref Rng & Units 09/29/2018 09/24/2018  09/08/2018  WBC 4.0 - 10.5 K/uL 9.1 6.4 -  Hemoglobin 13.0 - 17.0 g/dL 8.6(L) 9.6(L) 6.7(L)  Hematocrit 39.0 - 52.0 % 26.6(L) 29.8(L) -  Platelets 150 - 400 K/uL 229 242 -      CMP     Component Value Date/Time   NA 139 09/29/2018 0404   NA 138 05/18/2014 2142   K 4.1 09/29/2018 0404   K 5.0 06/28/2014 1254   CL 96 (L) 09/29/2018 0404   CL 98 05/18/2014 2142   CO2 31 09/29/2018 0404   CO2 33 (H) 05/18/2014 2142   GLUCOSE 137 (H) 09/29/2018 0404   GLUCOSE 88 05/18/2014 2142   BUN 35 (H) 09/29/2018 0404   BUN 12 05/18/2014 2142   CREATININE 10.81 (H) 09/29/2018 0404   CREATININE 5.73 (H) 05/18/2014 2142   CALCIUM 8.5 (L) 09/29/2018 0404   CALCIUM 8.3 (L) 05/18/2014 2142   PROT 6.9 09/24/2018 1554   PROT 7.2 07/05/2013 1100   ALBUMIN 3.5 09/24/2018 1554   ALBUMIN 4.1 07/05/2013 1100   AST 17 09/24/2018 1554   AST 20 07/05/2013 1100   ALT 11 09/24/2018 1554   ALT 24 07/05/2013 1100   ALKPHOS 81 09/24/2018 1554   ALKPHOS 102 07/05/2013 1100   BILITOT 0.7 09/24/2018 1554   BILITOT 0.4 07/05/2013 1100   GFRNONAA 4 (L) 09/29/2018 0404   GFRNONAA 10 (L) 05/18/2014 2142   GFRAA 5 (L) 09/29/2018 0404   GFRAA 12 (L) 05/18/2014 2142     No results found.     Assessment & Plan:   1. ESRD (end stage renal disease) ([place]) Recommend:  The patient is doing well and currently has adequate dialysis access. The patient's dialysis center is not reporting any access issues. Flow pattern is stable when compared to the prior ultrasound.  The patient should have a duplex ultrasound of the dialysis access in 6 months. The patient will follow-up with me in the office after each ultrasound     2. Primary hypertension Continue antihypertensive medications as already ordered, these medications have been reviewed and there are no changes at this time.   3. Skin lump of arm, left Aneurysmal portion of the patient's fistula.  No need for intervention at this time.   Current  Outpatient Medications on File Prior to Visit  Medication Sig Dispense Refill  . acetaminophen (TYLENOL) 500 MG tablet Take 1 tablet (500 mg total) by mouth every 6 (six) hours as needed for mild pain. 180 tablet 0  . albuterol (PROVENTIL HFA) 108 (90 Base) MCG/ACT inhaler Inhale 2  puffs into the lungs every 6 (six) hours as needed for wheezing.     Marland Kitchen allopurinol (ZYLOPRIM) 100 MG tablet Take 200 mg by mouth daily.     Marland Kitchen ascorbic acid (VITAMIN C) 500 MG tablet Take 500 mg by mouth daily.    Marland Kitchen aspirin 81 MG EC tablet Take 81 mg by mouth daily.     . carvedilol (COREG CR) 10 MG 24 hr capsule Take 10 mg by mouth daily.     . carvedilol (COREG) 25 MG tablet Take 25 mg by mouth.     . cinacalcet (SENSIPAR) 60 MG tablet Take 180 mg by mouth daily.     . cyanocobalamin 1000 MCG tablet Take 1,000 mcg daily by mouth.    . felodipine (PLENDIL) 2.5 MG 24 hr tablet Take 2.5 mg by mouth daily.     Marland Kitchen levETIRAcetam (KEPPRA XR) 500 MG 24 hr tablet Take 500 mg by mouth See admin instructions. Take 500 mg twice a day and takes and additional 500 mg on Mon. Wed and Friday after dialysis    . Methoxy PEG-Epoetin Beta (MIRCERA IJ) Mircera    . omeprazole (PRILOSEC) 20 MG capsule Take 20 mg by mouth daily as needed (Heartburn).     . sevelamer carbonate (RENVELA) 800 MG tablet Take 2,400 mg by mouth 3 (three) times daily with meals.     Marland Kitchen VIMPAT 100 MG TABS Take 100 mg by mouth See admin instructions. Take 100 mg twice a day and extra 100 mg Mon. Wed. and Friday after dialysys    . beclomethasone (QVAR) 80 MCG/ACT inhaler Inhale 1 puff into the lungs as needed (for shortness of breath). (Patient not taking: Reported on 09/06/2020)     No current facility-administered medications on file prior to visit.    There are no Patient Instructions on file for this visit. No follow-ups on file.   Kris Hartmann, NP

## 2020-11-20 ENCOUNTER — Encounter (INDEPENDENT_AMBULATORY_CARE_PROVIDER_SITE_OTHER): Payer: Medicare PPO

## 2020-11-20 ENCOUNTER — Ambulatory Visit (INDEPENDENT_AMBULATORY_CARE_PROVIDER_SITE_OTHER): Payer: Medicare PPO | Admitting: Vascular Surgery

## 2020-11-23 ENCOUNTER — Other Ambulatory Visit: Payer: Self-pay | Admitting: Primary Care

## 2020-11-23 ENCOUNTER — Other Ambulatory Visit (HOSPITAL_COMMUNITY): Payer: Self-pay | Admitting: Primary Care

## 2020-11-23 DIAGNOSIS — R109 Unspecified abdominal pain: Secondary | ICD-10-CM

## 2020-11-23 DIAGNOSIS — R1031 Right lower quadrant pain: Secondary | ICD-10-CM

## 2020-11-26 DIAGNOSIS — Z20822 Contact with and (suspected) exposure to covid-19: Secondary | ICD-10-CM | POA: Insufficient documentation

## 2020-11-29 ENCOUNTER — Ambulatory Visit: Payer: Medicare PPO | Attending: Primary Care

## 2020-11-29 DIAGNOSIS — U071 COVID-19: Secondary | ICD-10-CM | POA: Insufficient documentation

## 2021-01-08 ENCOUNTER — Telehealth (INDEPENDENT_AMBULATORY_CARE_PROVIDER_SITE_OTHER): Payer: Self-pay | Admitting: Vascular Surgery

## 2021-01-09 ENCOUNTER — Other Ambulatory Visit (INDEPENDENT_AMBULATORY_CARE_PROVIDER_SITE_OTHER): Payer: Self-pay | Admitting: Nurse Practitioner

## 2021-01-09 DIAGNOSIS — M7989 Other specified soft tissue disorders: Secondary | ICD-10-CM

## 2021-01-10 ENCOUNTER — Telehealth (INDEPENDENT_AMBULATORY_CARE_PROVIDER_SITE_OTHER): Payer: Self-pay

## 2021-01-10 ENCOUNTER — Ambulatory Visit (INDEPENDENT_AMBULATORY_CARE_PROVIDER_SITE_OTHER): Payer: Medicare PPO

## 2021-01-10 ENCOUNTER — Ambulatory Visit (INDEPENDENT_AMBULATORY_CARE_PROVIDER_SITE_OTHER): Payer: Medicare PPO | Admitting: Nurse Practitioner

## 2021-01-10 ENCOUNTER — Other Ambulatory Visit: Payer: Self-pay

## 2021-01-10 ENCOUNTER — Encounter (INDEPENDENT_AMBULATORY_CARE_PROVIDER_SITE_OTHER): Payer: Self-pay | Admitting: Nurse Practitioner

## 2021-01-10 VITALS — BP 128/76 | HR 73 | Resp 16 | Wt 154.0 lb

## 2021-01-10 DIAGNOSIS — N186 End stage renal disease: Secondary | ICD-10-CM

## 2021-01-10 DIAGNOSIS — I10 Essential (primary) hypertension: Secondary | ICD-10-CM

## 2021-01-10 DIAGNOSIS — M7989 Other specified soft tissue disorders: Secondary | ICD-10-CM

## 2021-01-10 DIAGNOSIS — R600 Localized edema: Secondary | ICD-10-CM | POA: Diagnosis not present

## 2021-01-10 NOTE — Telephone Encounter (Signed)
Spoke with Jeremy Rose for the patient and he is scheduled with [name] for a left arm fistulagram on 01/17/21 with a 8:45 am arrival time to the [facility]. Covid testing on 01/15/21 between 8-1 pm at the [facility]. Pre-procedure instructions were discussed and will be mailed.

## 2021-01-13 ENCOUNTER — Encounter (INDEPENDENT_AMBULATORY_CARE_PROVIDER_SITE_OTHER): Payer: Self-pay | Admitting: Nurse Practitioner

## 2021-01-13 NOTE — H&P (View-Only) (Signed)
Subjective:    Patient ID: Jeremy Rose, male    DOB: [birth year], 65 y.o.   MRN: [id number] Chief Complaint  Patient presents with  . Follow-up    Est pt knot on left upper part of access      The patient presents today for evaluation of pain in his left upper extremity.  The pain occurs during dialysis however the patient is not able to say if it occurs during dialysis with cannulation or after cannulation.  He is unable to pinpoint a specific time.  He does describe it as a sharp aching sensation.  He also describes having episodes of bleeding.  He notes that he has had several hard to control bleeding episodes after dialysis.  There is not appear to be no skin threatening of his access.  The area that the patient complains of patent aneurysmal section.  He denies any fever or chills.  Patient has a flow volume of 1305.  The aneurysm measures 2.59 cm x 3.25 cm.  There appears to be elevated velocities near the proximal upper arm.  Flow volume is decreased from 1915, previously seen on 10/23/2021.   Review of Systems  Hematological: Bruises/bleeds easily.  All other systems reviewed and are negative.      Objective:   Physical Exam Vitals reviewed.  HENT:     Head: Normocephalic.  Cardiovascular:     Rate and Rhythm: Normal rate.     Pulses: Normal pulses.          Radial pulses are 2+ on the left side.     Arteriovenous access: left arteriovenous access is present.    Comments: Good thrill and bruit left brachial cephalic AV fistula Pulmonary:     Effort: Pulmonary effort is normal.  Neurological:     Mental Status: He is alert and oriented to person, place, and time.  Psychiatric:        Mood and Affect: Mood normal.        Behavior: Behavior normal.        Thought Content: Thought content normal.        Judgment: Judgment normal.     BP 128/76 (BP Location: Right Arm)   Pulse 73   Resp 16   Wt 154 lb (69.9 kg)   BMI 23.42 kg/m   Past Medical History:   Diagnosis Date  . Arthritis    hands  . Asthma    uses inhaler  . Coronary artery disease   . Dialysis patient [facility]) 2012   Monday, Wednesday, Friday  . GERD (gastroesophageal reflux disease)   . Gout of right hand   . Hypertension   . Renal disorder    stage V..on dialysis  . Seizures ([place]) 2019   last seizure 5 years ago. had 20 in one day    Social History   Socioeconomic History  . Marital status: Single    Spouse name: Not on file  . Number of children: 0  . Years of education: Not on file  . Highest education level: Not on file  Occupational History  . Occupation: disabled  Tobacco Use  . Smoking status: Former Smoker    Types: Cigarettes  . Smokeless tobacco: Former Systems developer    Quit date: 2010  . Tobacco comment: did not inhale!!  Vaping Use  . Vaping Use: Never used  Substance and Sexual Activity  . Alcohol use: No  . Drug use: No  . Sexual activity: Not Currently  Other  Topics Concern  . Not on file  Social History Narrative   Lives by himself with close neighbors who help him    Social Determinants of Health   Financial Resource Strain: Not on file  Food Insecurity: Not on file  Transportation Needs: Not on file  Physical Activity: Not on file  Stress: Not on file  Social Connections: Not on file  Intimate Partner Violence: Not on file    Past Surgical History:  Procedure Laterality Date  . A/V FISTULAGRAM Left 02/24/2017   Procedure: A/V Fistulagram;  Surgeon: Katha Cabal, MD;  Location: [place] [facility];  Service: Cardiovascular;  Laterality: Left;  . A/V FISTULAGRAM Left 07/20/2019   Procedure: A/V FISTULAGRAM;  Surgeon: Algernon Huxley, MD;  Location: [place] [facility];  Service: Cardiovascular;  Laterality: Left;  . A/V FISTULAGRAM Left 04/03/2020   Procedure: A/V FISTULAGRAM;  Surgeon: Algernon Huxley, MD;  Location: [place] [facility];  Service: Cardiovascular;  Laterality: Left;  . A/V FISTULAGRAM Left 09/06/2020   Procedure: A/V  FISTULAGRAM;  Surgeon: Algernon Huxley, MD;  Location: [place] [facility];  Service: Cardiovascular;  Laterality: Left;  . A/V SHUNT INTERVENTION N/A 02/24/2017   Procedure: A/V Shunt Intervention;  Surgeon: Katha Cabal, MD;  Location: [place] [facility];  Service: Cardiovascular;  Laterality: N/A;  . A/V SHUNT INTERVENTION N/A 11/14/2019   Procedure: A/V SHUNT INTERVENTION;  Surgeon: Algernon Huxley, MD;  Location: [place] [facility];  Service: Cardiovascular;  Laterality: N/A;  . AV FISTULA PLACEMENT Left   . AV FISTULA PLACEMENT Left 12/02/2017   Procedure: ARTERIOVENOUS (AV) FISTULA CREATION ( BRACHIAL CEPHALIC );  Surgeon: Katha Cabal, MD;  Location: [facility];  Service: Vascular;  Laterality: Left;  . CARDIAC CATHETERIZATION  2012   no stents  . COLONOSCOPY Left 09/05/2018   Procedure: COLONOSCOPY;  Surgeon: Virgel Manifold, MD;  Location: [facility] ENDOSCOPY;  Service: Endoscopy;  Laterality: Left;  . DIALYSIS/PERMA CATHETER REMOVAL N/A 08/24/2018   Procedure: DIALYSIS/PERMA CATHETER REMOVAL;  Surgeon: Katha Cabal, MD;  Location: [place] [facility];  Service: Cardiovascular;  Laterality: N/A;  . ESOPHAGOGASTRODUODENOSCOPY Left 09/05/2018   Procedure: ESOPHAGOGASTRODUODENOSCOPY (EGD);  Surgeon: Virgel Manifold, MD;  Location: [facility] ENDOSCOPY;  Service: Endoscopy;  Laterality: Left;  . ESOPHAGOGASTRODUODENOSCOPY (EGD) WITH PROPOFOL N/A 08/31/2018   Procedure: ESOPHAGOGASTRODUODENOSCOPY (EGD) WITH PROPOFOL;  Surgeon: Lucilla Lame, MD;  Location: [facility] ENDOSCOPY;  Service: Endoscopy;  Laterality: N/A;  . GIVENS CAPSULE STUDY N/A 09/07/2018   Procedure: GIVENS CAPSULE STUDY;  Surgeon: Jonathon Bellows, MD;  Location: [facility] ENDOSCOPY;  Service: Gastroenterology;  Laterality: N/A;  . INSERTION OF DIALYSIS CATHETER Left 05/28/2018   Procedure: INSERTION OF DIALYSIS CATHETER ( TUNNELED CATH INSERT );  Surgeon: Katha Cabal, MD;  Location: [facility] ORS;  Service: Vascular;   Laterality: Left;  . LIGATION OF ARTERIOVENOUS  FISTULA Left 12/02/2017   Procedure: LIGATION OF ARTERIOVENOUS  FISTULA ( REMOVAL LEFT WRIST FISTULA );  Surgeon: Katha Cabal, MD;  Location: [facility] ORS;  Service: Vascular;  Laterality: Left;  . PERIPHERAL VASCULAR CATHETERIZATION Left 06/03/2016   Procedure: A/V Shuntogram/Fistulagram;  Surgeon: Katha Cabal, MD;  Location: [place] [facility];  Service: Cardiovascular;  Laterality: Left;  . PERIPHERAL VASCULAR CATHETERIZATION  06/03/2016   Procedure: Peripheral Vascular Balloon Angioplasty;  Surgeon: Katha Cabal, MD;  Location: [place] [facility];  Service: Cardiovascular;;  . PERIPHERAL VASCULAR THROMBECTOMY Left 11/14/2019   Procedure: PERIPHERAL VASCULAR  THROMBECTOMY;  Surgeon: Algernon Huxley, MD;  Location: [place] [facility];  Service: Cardiovascular;  Laterality: Left;  . REVISON OF ARTERIOVENOUS FISTULA Left 05/28/2018   Procedure: REVISON OF ARTERIOVENOUS FISTULA ( REMOVE ANEURYSM );  Surgeon: Katha Cabal, MD;  Location: [facility]S;  Service: Vascular;  Laterality: Left;  . UPPER EXTREMITY VENOGRAPHY N/A 04/13/2018   Procedure: UPPER EXTREMITY VENOGRAPHY;  Surgeon: Katha Cabal, MD;  Location: [place] [facility];  Service: Cardiovascular;  Laterality: N/A;    Family History  Problem Relation Age of Onset  . Heart disease Mother   . Kidney disease Paternal Uncle     Allergies  Allergen Reactions  . Dextromethorphan-Guaifenesin Nausea And Vomiting  . Codeine Nausea And Vomiting  . Doxycycline Nausea And Vomiting    CBC Latest Ref Rng & Units 09/29/2018 09/24/2018 09/08/2018  WBC 4.0 - 10.5 K/uL 9.1 6.4 -  Hemoglobin 13.0 - 17.0 g/dL 8.6(L) 9.6(L) 6.7(L)  Hematocrit 39.0 - 52.0 % 26.6(L) 29.8(L) -  Platelets 150 - 400 K/uL 229 242 -      CMP     Component Value Date/Time   NA 139 09/29/2018 0404   NA 138 05/18/2014 2142   K 4.1 09/29/2018 0404   K 5.0 06/28/2014 1254   CL 96 (L) 09/29/2018  0404   CL 98 05/18/2014 2142   CO2 31 09/29/2018 0404   CO2 33 (H) 05/18/2014 2142   GLUCOSE 137 (H) 09/29/2018 0404   GLUCOSE 88 05/18/2014 2142   BUN 35 (H) 09/29/2018 0404   BUN 12 05/18/2014 2142   CREATININE 10.81 (H) 09/29/2018 0404   CREATININE 5.73 (H) 05/18/2014 2142   CALCIUM 8.5 (L) 09/29/2018 0404   CALCIUM 8.3 (L) 05/18/2014 2142   PROT 6.9 09/24/2018 1554   PROT 7.2 07/05/2013 1100   ALBUMIN 3.5 09/24/2018 1554   ALBUMIN 4.1 07/05/2013 1100   AST 17 09/24/2018 1554   AST 20 07/05/2013 1100   ALT 11 09/24/2018 1554   ALT 24 07/05/2013 1100   ALKPHOS 81 09/24/2018 1554   ALKPHOS 102 07/05/2013 1100   BILITOT 0.7 09/24/2018 1554   BILITOT 0.4 07/05/2013 1100   GFRNONAA 4 (L) 09/29/2018 0404   GFRNONAA 10 (L) 05/18/2014 2142   GFRAA 5 (L) 09/29/2018 0404   GFRAA 12 (L) 05/18/2014 2142     No results found.     Assessment & Plan:   1. ESRD (end stage renal disease) ([place]) Recommend:  The patient is experiencing increasing problems with their dialysis access.  Patient should have a fistulagram with the intention for intervention.  The intention for intervention is to restore appropriate flow and prevent thrombosis and possible loss of the access.  As well as improve the quality of dialysis therapy.  The risks, benefits and alternative therapies were reviewed in detail with the patient.  All questions were answered.  The patient agrees to proceed with angio/intervention.      2. 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 Essential hypertension Continue antihypertensive medications as already ordered, these medications have been reviewed and there are no changes at this time.    Current Outpatient Medications on File Prior to Visit  Medication Sig Dispense Refill  . acetaminophen (TYLENOL) 500 MG tablet Take 1 tablet (500 mg total) by mouth every 6 (six) hours as needed for mild pain. 180 tablet 0  . albuterol  (VENTOLIN HFA) 108 (90 Base) MCG/ACT inhaler  Inhale 2 puffs into the lungs every 6 (six) hours as needed for wheezing.     Marland Kitchen allopurinol (ZYLOPRIM) 100 MG tablet Take 200 mg by mouth daily.     Marland Kitchen ascorbic acid (VITAMIN C) 500 MG tablet Take 500 mg by mouth daily.    Marland Kitchen aspirin 81 MG EC tablet Take 81 mg by mouth daily.     . carvedilol (COREG CR) 10 MG 24 hr capsule Take 10 mg by mouth daily.     . carvedilol (COREG) 25 MG tablet Take 25 mg by mouth.     . cinacalcet (SENSIPAR) 60 MG tablet Take 180 mg by mouth daily.     . cyanocobalamin 1000 MCG tablet Take 1,000 mcg daily by mouth.    . felodipine (PLENDIL) 2.5 MG 24 hr tablet Take 2.5 mg by mouth daily.     Marland Kitchen levETIRAcetam (KEPPRA XR) 500 MG 24 hr tablet Take 500 mg by mouth See admin instructions. Take 500 mg twice a day and takes and additional 500 mg on Mon. Wed and Friday after dialysis    . omeprazole (PRILOSEC) 20 MG capsule Take 20 mg by mouth daily as needed (Heartburn).     . sevelamer carbonate (RENVELA) 800 MG tablet Take 2,400 mg by mouth 3 (three) times daily with meals.     Marland Kitchen VIMPAT 100 MG TABS Take 100 mg by mouth See admin instructions. Take 100 mg twice a day and extra 100 mg Mon. Wed. and Friday after dialysys    . beclomethasone (QVAR) 80 MCG/ACT inhaler Inhale 1 puff into the lungs as needed (for shortness of breath). (Patient not taking: No sig reported)     No current facility-administered medications on file prior to visit.    There are no Patient Instructions on file for this visit. No follow-ups on file.   Kris Hartmann, NP

## 2021-01-13 NOTE — Progress Notes (Signed)
Subjective:    Patient ID: Jeremy Rose, male    DOB: 1956/07/23, 65 y.o.   MRN: [id number] Chief Complaint  Patient presents with  . Follow-up    Est pt knot on left upper part of access      The patient presents today for evaluation of pain in his left upper extremity.  The pain occurs during dialysis however the patient is not able to say if it occurs during dialysis with cannulation or after cannulation.  He is unable to pinpoint a specific time.  He does describe it as a sharp aching sensation.  He also describes having episodes of bleeding.  He notes that he has had several hard to control bleeding episodes after dialysis.  There is not appear to be no skin threatening of his access.  The area that the patient complains of patent aneurysmal section.  He denies any fever or chills.  Patient has a flow volume of 1305.  The aneurysm measures 2.59 cm x 3.25 cm.  There appears to be elevated velocities near the proximal upper arm.  Flow volume is decreased from 1915, previously seen on 10/23/2021.   Review of Systems  Hematological: Bruises/bleeds easily.  All other systems reviewed and are negative.      Objective:   Physical Exam Vitals reviewed.  HENT:     Head: Normocephalic.  Cardiovascular:     Rate and Rhythm: Normal rate.     Pulses: Normal pulses.          Radial pulses are 2+ on the left side.     Arteriovenous access: left arteriovenous access is present.    Comments: Good thrill and bruit left brachial cephalic AV fistula Pulmonary:     Effort: Pulmonary effort is normal.  Neurological:     Mental Status: He is alert and oriented to person, place, and time.  Psychiatric:        Mood and Affect: Mood normal.        Behavior: Behavior normal.        Thought Content: Thought content normal.        Judgment: Judgment normal.     BP 128/76 (BP Location: Right Arm)   Pulse 73   Resp 16   Wt 154 lb (69.9 kg)   BMI 23.42 kg/m   Past Medical History:   Diagnosis Date  . Arthritis    hands  . Asthma    uses inhaler  . Coronary artery disease   . Dialysis patient [facility]) 2012   Monday, Wednesday, Friday  . GERD (gastroesophageal reflux disease)   . Gout of right hand   . Hypertension   . Renal disorder    stage V..on dialysis  . Seizures ([employer]) 2019   last seizure 5 years ago. had 20 in one day    Social History   Socioeconomic History  . Marital status: Single    Spouse name: Not on file  . Number of children: 0  . Years of education: Not on file  . Highest education level: Not on file  Occupational History  . Occupation: disabled  Tobacco Use  . Smoking status: Former Smoker    Types: Cigarettes  . Smokeless tobacco: Former Systems developer    Quit date: 2010  . Tobacco comment: did not inhale!!  Vaping Use  . Vaping Use: Never used  Substance and Sexual Activity  . Alcohol use: No  . Drug use: No  . Sexual activity: Not Currently  Other  Topics Concern  . Not on file  Social History Narrative   Lives by himself with close neighbors who help him    Social Determinants of Health   Financial Resource Strain: Not on file  Food Insecurity: Not on file  Transportation Needs: Not on file  Physical Activity: Not on file  Stress: Not on file  Social Connections: Not on file  Intimate Partner Violence: Not on file    Past Surgical History:  Procedure Laterality Date  . A/V FISTULAGRAM Left 02/24/2017   Procedure: A/V Fistulagram;  Surgeon: Katha Cabal, MD;  Location: [place] [facility];  Service: Cardiovascular;  Laterality: Left;  . A/V FISTULAGRAM Left 07/20/2019   Procedure: A/V FISTULAGRAM;  Surgeon: Algernon Huxley, MD;  Location: [place] [facility];  Service: Cardiovascular;  Laterality: Left;  . A/V FISTULAGRAM Left 04/03/2020   Procedure: A/V FISTULAGRAM;  Surgeon: Algernon Huxley, MD;  Location: [place] [facility];  Service: Cardiovascular;  Laterality: Left;  . A/V FISTULAGRAM Left 09/06/2020   Procedure: A/V  FISTULAGRAM;  Surgeon: Algernon Huxley, MD;  Location: [place] [facility];  Service: Cardiovascular;  Laterality: Left;  . A/V SHUNT INTERVENTION N/A 02/24/2017   Procedure: A/V Shunt Intervention;  Surgeon: Katha Cabal, MD;  Location: [place] [facility];  Service: Cardiovascular;  Laterality: N/A;  . A/V SHUNT INTERVENTION N/A 11/14/2019   Procedure: A/V SHUNT INTERVENTION;  Surgeon: Algernon Huxley, MD;  Location: [place] [facility];  Service: Cardiovascular;  Laterality: N/A;  . AV FISTULA PLACEMENT Left   . AV FISTULA PLACEMENT Left 12/02/2017   Procedure: ARTERIOVENOUS (AV) FISTULA CREATION ( BRACHIAL CEPHALIC );  Surgeon: Katha Cabal, MD;  Location: [facility];  Service: Vascular;  Laterality: Left;  . CARDIAC CATHETERIZATION  2012   no stents  . COLONOSCOPY Left 09/05/2018   Procedure: COLONOSCOPY;  Surgeon: Virgel Manifold, MD;  Location: [facility] ENDOSCOPY;  Service: Endoscopy;  Laterality: Left;  . DIALYSIS/PERMA CATHETER REMOVAL N/A 08/24/2018   Procedure: DIALYSIS/PERMA CATHETER REMOVAL;  Surgeon: Katha Cabal, MD;  Location: [place] [facility];  Service: Cardiovascular;  Laterality: N/A;  . ESOPHAGOGASTRODUODENOSCOPY Left 09/05/2018   Procedure: ESOPHAGOGASTRODUODENOSCOPY (EGD);  Surgeon: Virgel Manifold, MD;  Location: [facility] ENDOSCOPY;  Service: Endoscopy;  Laterality: Left;  . ESOPHAGOGASTRODUODENOSCOPY (EGD) WITH PROPOFOL N/A 08/31/2018   Procedure: ESOPHAGOGASTRODUODENOSCOPY (EGD) WITH PROPOFOL;  Surgeon: Lucilla Lame, MD;  Location: [facility] ENDOSCOPY;  Service: Endoscopy;  Laterality: N/A;  . GIVENS CAPSULE STUDY N/A 09/07/2018   Procedure: GIVENS CAPSULE STUDY;  Surgeon: Jonathon Bellows, MD;  Location: [facility] ENDOSCOPY;  Service: Gastroenterology;  Laterality: N/A;  . INSERTION OF DIALYSIS CATHETER Left 05/28/2018   Procedure: INSERTION OF DIALYSIS CATHETER ( TUNNELED CATH INSERT );  Surgeon: Katha Cabal, MD;  Location: [facility] ORS;  Service: Vascular;   Laterality: Left;  . LIGATION OF ARTERIOVENOUS  FISTULA Left 12/02/2017   Procedure: LIGATION OF ARTERIOVENOUS  FISTULA ( REMOVAL LEFT WRIST FISTULA );  Surgeon: Katha Cabal, MD;  Location: [facility];  Service: Vascular;  Laterality: Left;  . PERIPHERAL VASCULAR CATHETERIZATION Left 06/03/2016   Procedure: A/V Shuntogram/Fistulagram;  Surgeon: Katha Cabal, MD;  Location: [place] [facility];  Service: Cardiovascular;  Laterality: Left;  . PERIPHERAL VASCULAR CATHETERIZATION  06/03/2016   Procedure: Peripheral Vascular Balloon Angioplasty;  Surgeon: Katha Cabal, MD;  Location: [place] [facility];  Service: Cardiovascular;;  . PERIPHERAL VASCULAR THROMBECTOMY Left 11/14/2019   Procedure: PERIPHERAL VASCULAR  THROMBECTOMY;  Surgeon: Algernon Huxley, MD;  Location: [place] [facility];  Service: Cardiovascular;  Laterality: Left;  . REVISON OF ARTERIOVENOUS FISTULA Left 05/28/2018   Procedure: REVISON OF ARTERIOVENOUS FISTULA ( REMOVE ANEURYSM );  Surgeon: Katha Cabal, MD;  Location: [facility] ORS;  Service: Vascular;  Laterality: Left;  . UPPER EXTREMITY VENOGRAPHY N/A 04/13/2018   Procedure: UPPER EXTREMITY VENOGRAPHY;  Surgeon: Katha Cabal, MD;  Location: [place] [facility];  Service: Cardiovascular;  Laterality: N/A;    Family History  Problem Relation Age of Onset  . Heart disease Mother   . Kidney disease Paternal Uncle     Allergies  Allergen Reactions  . Dextromethorphan-Guaifenesin Nausea And Vomiting  . Codeine Nausea And Vomiting  . Doxycycline Nausea And Vomiting    CBC Latest Ref Rng & Units 09/29/2018 09/24/2018 09/08/2018  WBC 4.0 - 10.5 K/uL 9.1 6.4 -  Hemoglobin 13.0 - 17.0 g/dL 8.6(L) 9.6(L) 6.7(L)  Hematocrit 39.0 - 52.0 % 26.6(L) 29.8(L) -  Platelets 150 - 400 K/uL 229 242 -      CMP     Component Value Date/Time   NA 139 09/29/2018 0404   NA 138 05/18/2014 2142   K 4.1 09/29/2018 0404   K 5.0 06/28/2014 1254   CL 96 (L) 09/29/2018  0404   CL 98 05/18/2014 2142   CO2 31 09/29/2018 0404   CO2 33 (H) 05/18/2014 2142   GLUCOSE 137 (H) 09/29/2018 0404   GLUCOSE 88 05/18/2014 2142   BUN 35 (H) 09/29/2018 0404   BUN 12 05/18/2014 2142   CREATININE 10.81 (H) 09/29/2018 0404   CREATININE 5.73 (H) 05/18/2014 2142   CALCIUM 8.5 (L) 09/29/2018 0404   CALCIUM 8.3 (L) 05/18/2014 2142   PROT 6.9 09/24/2018 1554   PROT 7.2 07/05/2013 1100   ALBUMIN 3.5 09/24/2018 1554   ALBUMIN 4.1 07/05/2013 1100   AST 17 09/24/2018 1554   AST 20 07/05/2013 1100   ALT 11 09/24/2018 1554   ALT 24 07/05/2013 1100   ALKPHOS 81 09/24/2018 1554   ALKPHOS 102 07/05/2013 1100   BILITOT 0.7 09/24/2018 1554   BILITOT 0.4 07/05/2013 1100   GFRNONAA 4 (L) 09/29/2018 0404   GFRNONAA 10 (L) 05/18/2014 2142   GFRAA 5 (L) 09/29/2018 0404   GFRAA 12 (L) 05/18/2014 2142     No results found.     Assessment & Plan:   1. ESRD (end stage renal disease) ([place]) Recommend:  The patient is experiencing increasing problems with their dialysis access.  Patient should have a fistulagram with the intention for intervention.  The intention for intervention is to restore appropriate flow and prevent thrombosis and possible loss of the access.  As well as improve the quality of dialysis therapy.  The risks, benefits and alternative therapies were reviewed in detail with the patient.  All questions were answered.  The patient agrees to proceed with angio/intervention.      2. 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 Essential hypertension Continue antihypertensive medications as already ordered, these medications have been reviewed and there are no changes at this time.    Current Outpatient Medications on File Prior to Visit  Medication Sig Dispense Refill  . acetaminophen (TYLENOL) 500 MG tablet Take 1 tablet (500 mg total) by mouth every 6 (six) hours as needed for mild pain. 180 tablet 0  . albuterol  (VENTOLIN HFA) 108 (90 Base) MCG/ACT inhaler  Inhale 2 puffs into the lungs every 6 (six) hours as needed for wheezing.     Marland Kitchen allopurinol (ZYLOPRIM) 100 MG tablet Take 200 mg by mouth daily.     Marland Kitchen ascorbic acid (VITAMIN C) 500 MG tablet Take 500 mg by mouth daily.    Marland Kitchen aspirin 81 MG EC tablet Take 81 mg by mouth daily.     . carvedilol (COREG CR) 10 MG 24 hr capsule Take 10 mg by mouth daily.     . carvedilol (COREG) 25 MG tablet Take 25 mg by mouth.     . cinacalcet (SENSIPAR) 60 MG tablet Take 180 mg by mouth daily.     . cyanocobalamin 1000 MCG tablet Take 1,000 mcg daily by mouth.    . felodipine (PLENDIL) 2.5 MG 24 hr tablet Take 2.5 mg by mouth daily.     Marland Kitchen levETIRAcetam (KEPPRA XR) 500 MG 24 hr tablet Take 500 mg by mouth See admin instructions. Take 500 mg twice a day and takes and additional 500 mg on Mon. Wed and Friday after dialysis    . omeprazole (PRILOSEC) 20 MG capsule Take 20 mg by mouth daily as needed (Heartburn).     . sevelamer carbonate (RENVELA) 800 MG tablet Take 2,400 mg by mouth 3 (three) times daily with meals.     Marland Kitchen VIMPAT 100 MG TABS Take 100 mg by mouth See admin instructions. Take 100 mg twice a day and extra 100 mg Mon. Wed. and Friday after dialysys    . beclomethasone (QVAR) 80 MCG/ACT inhaler Inhale 1 puff into the lungs as needed (for shortness of breath). (Patient not taking: No sig reported)     No current facility-administered medications on file prior to visit.    There are no Patient Instructions on file for this visit. No follow-ups on file.   Kris Hartmann, NP

## 2021-01-15 ENCOUNTER — Other Ambulatory Visit: Payer: Self-pay

## 2021-01-15 ENCOUNTER — Other Ambulatory Visit
Admission: RE | Admit: 2021-01-15 | Discharge: 2021-01-15 | Disposition: A | Discharge status: Home / Self Care | Payer: Medicare PPO | Source: Ambulatory Visit | Attending: Vascular Surgery | Admitting: Vascular Surgery

## 2021-01-15 DIAGNOSIS — Z20822 Contact with and (suspected) exposure to covid-19: Secondary | ICD-10-CM | POA: Insufficient documentation

## 2021-01-15 DIAGNOSIS — Z01812 Encounter for preprocedural laboratory examination: Secondary | ICD-10-CM | POA: Diagnosis present

## 2021-01-16 ENCOUNTER — Other Ambulatory Visit (INDEPENDENT_AMBULATORY_CARE_PROVIDER_SITE_OTHER): Payer: Self-pay | Admitting: Nurse Practitioner

## 2021-01-16 LAB — SARS CORONAVIRUS 2 (TAT 6-24 HRS): SARS Coronavirus 2: NEGATIVE

## 2021-01-17 ENCOUNTER — Encounter
Admission: RE | Disposition: A | Discharge status: Home / Self Care | Payer: Self-pay | Source: Home / Self Care | Attending: Vascular Surgery

## 2021-01-17 ENCOUNTER — Other Ambulatory Visit: Payer: Self-pay

## 2021-01-17 ENCOUNTER — Encounter: Payer: Self-pay | Admitting: Vascular Surgery

## 2021-01-17 ENCOUNTER — Ambulatory Visit
Admission: RE | Admit: 2021-01-17 | Discharge: 2021-01-17 | Disposition: A | Discharge status: Home / Self Care | Payer: Medicare PPO | Attending: Vascular Surgery | Admitting: Vascular Surgery

## 2021-01-17 DIAGNOSIS — N186 End stage renal disease: Secondary | ICD-10-CM | POA: Insufficient documentation

## 2021-01-17 DIAGNOSIS — Z841 Family history of disorders of kidney and ureter: Secondary | ICD-10-CM | POA: Insufficient documentation

## 2021-01-17 DIAGNOSIS — Z8249 Family history of ischemic heart disease and other diseases of the circulatory system: Secondary | ICD-10-CM | POA: Diagnosis not present

## 2021-01-17 DIAGNOSIS — Z885 Allergy status to narcotic agent status: Secondary | ICD-10-CM | POA: Insufficient documentation

## 2021-01-17 DIAGNOSIS — T82898A Other specified complication of vascular prosthetic devices, implants and grafts, initial encounter: Secondary | ICD-10-CM

## 2021-01-17 DIAGNOSIS — T82858A Stenosis of vascular prosthetic devices, implants and grafts, initial encounter: Secondary | ICD-10-CM | POA: Insufficient documentation

## 2021-01-17 DIAGNOSIS — Y832 Surgical operation with anastomosis, bypass or graft as the cause of abnormal reaction of the patient, or of later complication, without mention of misadventure at the time of the procedure: Secondary | ICD-10-CM | POA: Insufficient documentation

## 2021-01-17 DIAGNOSIS — Z79899 Other long term (current) drug therapy: Secondary | ICD-10-CM | POA: Insufficient documentation

## 2021-01-17 DIAGNOSIS — Z888 Allergy status to other drugs, medicaments and biological substances status: Secondary | ICD-10-CM | POA: Insufficient documentation

## 2021-01-17 DIAGNOSIS — Z7982 Long term (current) use of aspirin: Secondary | ICD-10-CM | POA: Diagnosis not present

## 2021-01-17 DIAGNOSIS — Z881 Allergy status to other antibiotic agents status: Secondary | ICD-10-CM | POA: Diagnosis not present

## 2021-01-17 DIAGNOSIS — I2510 Atherosclerotic heart disease of native coronary artery without angina pectoris: Secondary | ICD-10-CM | POA: Diagnosis not present

## 2021-01-17 DIAGNOSIS — Z8669 Personal history of other diseases of the nervous system and sense organs: Secondary | ICD-10-CM | POA: Insufficient documentation

## 2021-01-17 DIAGNOSIS — I120 Hypertensive chronic kidney disease with stage 5 chronic kidney disease or end stage renal disease: Secondary | ICD-10-CM | POA: Insufficient documentation

## 2021-01-17 DIAGNOSIS — Z992 Dependence on renal dialysis: Secondary | ICD-10-CM | POA: Diagnosis not present

## 2021-01-17 DIAGNOSIS — Z87891 Personal history of nicotine dependence: Secondary | ICD-10-CM | POA: Insufficient documentation

## 2021-01-17 HISTORY — PX: A/V FISTULAGRAM: CATH118298

## 2021-01-17 SURGERY — A/V FISTULAGRAM
Anesthesia: Moderate Sedation | Laterality: Left

## 2021-01-17 MED ORDER — SODIUM CHLORIDE 0.9 % IV SOLN: INTRAVENOUS | Status: DC

## 2021-01-17 MED ORDER — FAMOTIDINE 20 MG PO TABS: 40.0000 mg | ORAL_TABLET | Freq: Once | ORAL | Status: DC | PRN

## 2021-01-17 MED ORDER — DIPHENHYDRAMINE HCL 50 MG/ML IJ SOLN: 50.0000 mg | Freq: Once | INTRAMUSCULAR | Status: DC | PRN

## 2021-01-17 MED ORDER — MIDAZOLAM HCL 2 MG/ML PO SYRP: 8.0000 mg | ORAL_SOLUTION | Freq: Once | ORAL | Status: DC | PRN

## 2021-01-17 MED ORDER — HEPARIN SODIUM (PORCINE) 1000 UNIT/ML IJ SOLN
INTRAMUSCULAR | Status: AC
  Administered 2021-01-17: 3000 [IU]
  Filled 2021-01-17: qty 1

## 2021-01-17 MED ORDER — METHYLPREDNISOLONE SODIUM SUCC 125 MG IJ SOLR: 125.0000 mg | Freq: Once | INTRAMUSCULAR | Status: DC | PRN

## 2021-01-17 MED ORDER — CEFAZOLIN SODIUM-DEXTROSE 2-4 GM/100ML-% IV SOLN
INTRAVENOUS | Status: AC
  Administered 2021-01-17: 2 g
  Filled 2021-01-17: qty 100

## 2021-01-17 MED ORDER — FENTANYL CITRATE (PF) 100 MCG/2ML IJ SOLN: 12.5000 ug | Freq: Once | INTRAMUSCULAR | Status: DC | PRN

## 2021-01-17 MED ORDER — CEFAZOLIN SODIUM-DEXTROSE 2-4 GM/100ML-% IV SOLN: 2.0000 g | Freq: Once | INTRAVENOUS | Status: DC

## 2021-01-17 MED ORDER — MIDAZOLAM HCL 5 MG/5ML IJ SOLN
INTRAMUSCULAR | Status: AC
  Administered 2021-01-17: 2 mg
  Filled 2021-01-17: qty 5

## 2021-01-17 MED ORDER — IODIXANOL 320 MG/ML IV SOLN
INTRAVENOUS | Status: DC | PRN
  Administered 2021-01-17: 25 mL via INTRAVENOUS

## 2021-01-17 MED ORDER — FENTANYL CITRATE (PF) 100 MCG/2ML IJ SOLN
INTRAMUSCULAR | Status: AC
  Administered 2021-01-17: 50 ug
  Filled 2021-01-17: qty 2

## 2021-01-17 MED ORDER — ONDANSETRON HCL 4 MG/2ML IJ SOLN: 4.0000 mg | Freq: Four times a day (QID) | INTRAMUSCULAR | Status: DC | PRN

## 2021-01-17 SURGICAL SUPPLY — 11 items
BALLN LUTONIX AV 9X60X75 (BALLOONS) ×2
BALLOON LUTONIX AV 9X60X75 (BALLOONS) ×1 IMPLANT
CANNULA 5F STIFF (CANNULA) ×2 IMPLANT
COVER PROBE U/S 5X48 (MISCELLANEOUS) ×2 IMPLANT
DRAPE BRACHIAL (DRAPES) ×2 IMPLANT
GUIDEWIRE VERSACORE 175CM (WIRE) ×2 IMPLANT
KIT ENCORE 26 ADVANTAGE (KITS) ×2 IMPLANT
PACK ANGIOGRAPHY (CUSTOM PROCEDURE TRAY) ×2 IMPLANT
SHEATH BRITE TIP 6FRX5.5 (SHEATH) ×2 IMPLANT
SUT MNCRL AB 4-0 PS2 18 (SUTURE) ×2 IMPLANT
TOWEL OR 17X26 4PK STRL BLUE (TOWEL DISPOSABLE) ×2 IMPLANT

## 2021-01-17 NOTE — Discharge Instructions (Signed)
Dialysis Fistulogram  A dialysis fistulogram is an X-ray test to look inside the vascular access site. This is the area where blood is removed and returned to your body during dialysis. Fistulas and grafts provide easy and effective access for dialysis. However, sometimes problems can occur. The fistula or graft can become clogged or narrow. This can make dialysis less effective. You may need to have a fistulogram to check for these problems. Tell a health care provider about:  Any allergies you have, including any reactions you have had to contrast dye.  All medicines you are taking, including vitamins, herbs, eye drops, creams, and over-the-counter medicines.  Any problems you or family members have had with anesthetic medicines.  Any blood disorders you have.  Any surgeries you have had.  Any medical conditions you have.  Whether you are pregnant or may be pregnant.  Any warmth, pain, redness, or swelling in the limb that has the fistula or graft.  Any bleeding from your fistula or graft.  Any of the following in the area where the fistula or graft leads: ? Numbness. ? Tingling. ? A cold feeling. ? Bluish or pale white color. What are the risks? Generally, this is a safe procedure. However, problems may occur, including:  Infection.  Bleeding.  Allergic reactions to medicines or dyes.  Damage to other structures, such as nearby nerves or blood vessels.  A blood clot in the fistula or graft.  A blood clot that travels to your lungs (pulmonary embolism). What happens before the procedure? Medicines Ask your health care provider about:  Changing or stopping your regular medicines. This is especially important if you are taking diabetes medicines or blood thinners (anticoagulants).  Taking medicines such as aspirin and ibuprofen. These medicines can thin your blood. Do not take these medicines unless your health care provider tells you to take them.  Taking  over-the-counter medicines, vitamins, herbs, and supplements. General instructions  Follow instructions from your health care provider about eating and drinking restrictions.  Plan to have a responsible adult take you home from the hospital or clinic.  Plan to have a responsible adult care for you for the time you are told after you leave the hospital or clinic. This is important.  You may have a blood or urine sample taken. These will help your health care provider learn how well your kidneys and liver are working and how well your blood clots.  Ask your health care provider what steps will be taken to help prevent infection. What happens during the procedure?  An IV will be inserted into one of your veins.  You may be connected to monitors that track your heart rate, blood pressure, oxygen level, and pulse.  You will be given one or more of the following: ? A medicine to help you relax (sedative). ? A medicine to numb the area (local anesthetic) on the limb with your fistula or graft.  The area of your body where the catheter is to be inserted will be sterilized and covered with a surgical drape.  The health care provider will make a very small incision at the vascular access site.  A sheath or short tube will be inserted into the fistula or graft.  The catheter will be inserted through the sheath and advanced until it reaches the area of possible problems.  Dye, also called contrast material,will be injected into the catheter. An angiogram or X-rays will be taken to help identify the site of the blockage.  If a blockage or narrowing is found, your health care provider can do treatments during the procedure to clear the problem. Treatments to open a blocked or narrowed fistula or graft may include: ? Using a device to remove the clot from the fistula or graft. ? Using a balloon (angioplasty) to open or widen the blocked area. ? Placing a small mesh tube (stent) to keep the  fistula or graft open. ? Injecting a medicine to dissolve the clot (thrombolysis).  When the procedure is complete, the catheter will be removed and pressure will be applied to stop any bleeding.  In some cases, the catheter site will be closed with stitches (sutures) underneath the skin.  Your skin will be covered with a bandage (dressing) and the IV will be removed. The procedure may vary among health care providers and hospitals. What happens after the procedure?  Your blood pressure, heart rate, breathing rate, and blood oxygen level will be monitored until you leave the hospital or clinic. Your health care provider will also monitor your vascular access site.  You can expect to wait in a recovery area for several hours.  If you were given a sedative during the procedure, it can affect you for several hours. Do not drive or operate machinery until your health care provider says that it is safe. Summary  A dialysis fistulogram is an X-ray test to look inside the vascular access site. This is the area where blood is removed and returned to your body during dialysis.  Before the procedure, ask your health care provider if you should change or stop any medicines you are taking.  If a problem is found during a fistulogram, your health care provider can also do treatments to clear the blocked or narrowed areas. This information is not intended to replace advice given to you by your health care provider. Make sure you discuss any questions you have with your health care provider. Document Revised: 06/13/2020 Document Reviewed: 06/13/2020 Elsevier Patient Education  2021 [employer]. Moderate Conscious Sedation, Adult, Care After This sheet gives you information about how to care for yourself after your procedure. Your health care provider may also give you more specific instructions. If you have problems or questions, contact your health care provider. What can I expect after the  procedure? After the procedure, it is common to have:  Sleepiness for several hours.  Impaired judgment for several hours.  Difficulty with balance.  Vomiting if you eat too soon. Follow these instructions at home: For the time period you were told by your health care provider:  Rest.  Do not participate in activities where you could fall or become injured.  Do not drive or use machinery.  Do not drink alcohol.  Do not take sleeping pills or medicines that cause drowsiness.  Do not make important decisions or sign legal documents.  Do not take care of children on your own.      Eating and drinking  Follow the diet recommended by your health care provider.  Drink enough fluid to keep your urine pale yellow.  If you vomit: ? Drink water, juice, or soup when you can drink without vomiting. ? Make sure you have little or no nausea before eating solid foods.   General instructions  Take over-the-counter and prescription medicines only as told by your health care provider.  Have a responsible adult stay with you for the time you are told. It is important to have someone help care for you until  you are awake and alert.  Do not smoke.  Keep all follow-up visits as told by your health care provider. This is important. Contact a health care provider if:  You are still sleepy or having trouble with balance after 24 hours.  You feel light-headed.  You keep feeling nauseous or you keep vomiting.  You develop a rash.  You have a fever.  You have redness or swelling around the IV site. Get help right away if:  You have trouble breathing.  You have new-onset confusion at home. Summary  After the procedure, it is common to feel sleepy, have impaired judgment, or feel nauseous if you eat too soon.  Rest after you get home. Know the things you should not do after the procedure.  Follow the diet recommended by your health care provider and drink enough fluid to keep  your urine pale yellow.  Get help right away if you have trouble breathing or new-onset confusion at home. This information is not intended to replace advice given to you by your health care provider. Make sure you discuss any questions you have with your health care provider. Document Revised: 03/02/2020 Document Reviewed: 09/29/2019 Elsevier Patient Education  2021 [employer].

## 2021-01-17 NOTE — Interval H&P Note (Signed)
History and Physical Interval Note:  01/17/2021 9:17 AM  Jeremy Rose  has presented today for surgery, with the diagnosis of LT Arm Fistulagram   ESRD   Pt to have Covid test on 3-1.  The various methods of treatment have been discussed with the patient and family. After consideration of risks, benefits and other options for treatment, the patient has consented to  Procedure(s): A/V FISTULAGRAM (Left) as a surgical intervention.  The patient's history has been reviewed, patient examined, no change in status, stable for surgery.  I have reviewed the patient's chart and labs.  Questions were answered to the patient's satisfaction.     [name]

## 2021-01-17 NOTE — Op Note (Signed)
[facility]    OPERATIVE NOTE   PROCEDURE: 1.   Left brachiocephalic arteriovenous fistula cannulation under ultrasound guidance 2.   Left arm fistulagram including central venogram 3.   Percutaneous transluminal angioplasty of proximal upper arm cephalic vein with 9 mm diameter by 6 cm length Lutonix drug-coated angioplasty balloon  PRE-OPERATIVE DIAGNOSIS: 1. ESRD 2. Poorly functional aneurysmal left brachiocephalic AVF  POST-OPERATIVE DIAGNOSIS: same as above   SURGEON: [name], MD  ANESTHESIA: local with MCS  ESTIMATED BLOOD LOSS: 3 cc  FINDING(S): 1. There was a napkin ringlike lesion just proximal to the golf ball sized aneurysm in the proximal arm cephalic vein.  This lesion was difficult to quantify but appeared to be greater than 70%.  The remainder the fistula was widely patent as was the central venous flow.  SPECIMEN(S):  None  CONTRAST: 24 cc  FLUORO TIME: 0.9 minutes  MODERATE CONSCIOUS SEDATION TIME: Approximately 23 minutes with 2 mg of Versed and 50 mcg of Fentanyl   INDICATIONS: Jeremy Rose is a 65 y.o. male who presents with malfunctioning aneurysmal left brachiocephalic arteriovenous fistula.  The patient is scheduled for left arm fistulagram.  The patient is aware the risks include but are not limited to: bleeding, infection, thrombosis of the cannulated access, and possible anaphylactic reaction to the contrast.  The patient is aware of the risks of the procedure and elects to proceed forward.  DESCRIPTION: After full informed written consent was obtained, the patient was brought back to the angiography suite and placed supine upon the angiography table.  The patient was connected to monitoring equipment. Moderate conscious sedation was administered with a face to face encounter with the patient throughout the procedure with my supervision of the RN administering medicines and monitoring the patient's vital signs and mental  status throughout from the start of the procedure until the patient was taken to the recovery room. The left arm was prepped and draped in the standard fashion for a percutaneous access intervention.  Under ultrasound guidance, the left brachiocephalic arteriovenous fistula was cannulated with a micropuncture needle under direct ultrasound guidance where it was patent and a permanent image was performed.  The microwire was advanced into the fistula and the needle was exchanged for the a microsheath.  I then upsized to a 6 Fr Sheath and imaging was performed.  Hand injections were completed to image the access including the central venous system. This demonstrated that there was a napkin ringlike lesion just proximal to the golf ball sized aneurysm in the proximal arm cephalic vein.  This lesion was difficult to quantify but appeared to be greater than 70%.  The remainder the fistula was widely patent as was the central venous flow.  Based on the images, this patient will need intervention to this area. I then gave the patient 3000 units of intravenous heparin.  I then crossed the stenosis with a versa core wire.  Based on the imaging, a 9 mm x 6 cm Lutonix drug-coated angioplasty balloon was selected.  The balloon was centered around the proximal upper arm cephalic vein stenosis and inflated to 10 ATM for 1 minute(s).  On completion imaging, there was brisk flow through the area.  The residual degree of stenosis difficult to discern due to the aneurysm in the area but appears to be less than 40%.   Based on the completion imaging, no further intervention is necessary.  The wire and balloon were removed from the sheath.  A  4-0 Monocryl purse-string suture was sewn around the sheath.  The sheath was removed while tying down the suture.  A sterile bandage was applied to the puncture site.  COMPLICATIONS: None  CONDITION: Stable   [name]  01/17/2021 11:10 AM   This note was created with Dragon Medical  transcription system. Any errors in dictation are purely unintentional.

## 2021-02-25 ENCOUNTER — Other Ambulatory Visit (INDEPENDENT_AMBULATORY_CARE_PROVIDER_SITE_OTHER): Payer: Self-pay | Admitting: Vascular Surgery

## 2021-02-25 DIAGNOSIS — N186 End stage renal disease: Secondary | ICD-10-CM

## 2021-02-25 DIAGNOSIS — Z9862 Peripheral vascular angioplasty status: Secondary | ICD-10-CM

## 2021-02-26 ENCOUNTER — Ambulatory Visit (INDEPENDENT_AMBULATORY_CARE_PROVIDER_SITE_OTHER): Payer: Medicare PPO

## 2021-02-26 ENCOUNTER — Ambulatory Visit (INDEPENDENT_AMBULATORY_CARE_PROVIDER_SITE_OTHER): Payer: Medicare PPO | Admitting: Nurse Practitioner

## 2021-02-26 ENCOUNTER — Other Ambulatory Visit: Payer: Self-pay

## 2021-02-26 DIAGNOSIS — Z9862 Peripheral vascular angioplasty status: Secondary | ICD-10-CM

## 2021-02-26 DIAGNOSIS — N186 End stage renal disease: Secondary | ICD-10-CM

## 2021-03-06 ENCOUNTER — Encounter (INDEPENDENT_AMBULATORY_CARE_PROVIDER_SITE_OTHER): Payer: Self-pay | Admitting: *Deleted

## 2021-04-23 ENCOUNTER — Encounter (INDEPENDENT_AMBULATORY_CARE_PROVIDER_SITE_OTHER): Payer: Medicare PPO

## 2021-04-23 ENCOUNTER — Ambulatory Visit (INDEPENDENT_AMBULATORY_CARE_PROVIDER_SITE_OTHER): Payer: Medicare PPO | Admitting: Nurse Practitioner

## 2021-05-08 ENCOUNTER — Telehealth (INDEPENDENT_AMBULATORY_CARE_PROVIDER_SITE_OTHER): Payer: Self-pay | Admitting: Nurse Practitioner

## 2021-05-08 NOTE — Telephone Encounter (Signed)
Dr. Smith Mince from [facility] has put in a referral for this patient.  I explained to nurse that called, that a referral was not needed as he is a current patient.    The reason for the referral request is because patient has been having extreme pain at his access and wants to be seen.  Per [employer], patient have been having this problem for weeks.  The best contact for this patient is his neighbor Chief Financial Officer.  Please advise.

## 2021-05-09 NOTE — Telephone Encounter (Signed)
Please see previous encounters below and advise.  Thanks

## 2021-05-09 NOTE — Telephone Encounter (Signed)
Please ask Lavella Lemons about the referral in the system and have the patient brought in to be seen. Thank you

## 2021-06-03 ENCOUNTER — Telehealth (INDEPENDENT_AMBULATORY_CARE_PROVIDER_SITE_OTHER): Payer: Self-pay

## 2021-06-03 NOTE — Telephone Encounter (Signed)
Pts neighbor [employer] called and left a Vm on the nurses line saying that the pts port looks infected per the NP the pt's dialysis center should give [place] a call if they have a concern with his port I called the pt's neighbor and made her aware.

## 2021-07-16 ENCOUNTER — Telehealth (INDEPENDENT_AMBULATORY_CARE_PROVIDER_SITE_OTHER): Payer: Self-pay | Admitting: Nurse Practitioner

## 2021-07-16 ENCOUNTER — Other Ambulatory Visit (INDEPENDENT_AMBULATORY_CARE_PROVIDER_SITE_OTHER): Payer: Self-pay | Admitting: Vascular Surgery

## 2021-07-16 ENCOUNTER — Ambulatory Visit (INDEPENDENT_AMBULATORY_CARE_PROVIDER_SITE_OTHER): Payer: Medicare PPO | Admitting: Nurse Practitioner

## 2021-07-16 ENCOUNTER — Ambulatory Visit (INDEPENDENT_AMBULATORY_CARE_PROVIDER_SITE_OTHER): Payer: Medicare PPO

## 2021-07-16 ENCOUNTER — Other Ambulatory Visit: Payer: Self-pay

## 2021-07-16 VITALS — BP 113/68 | HR 72 | Resp 16 | Wt 152.2 lb

## 2021-07-16 DIAGNOSIS — N186 End stage renal disease: Secondary | ICD-10-CM

## 2021-07-16 DIAGNOSIS — M79602 Pain in left arm: Secondary | ICD-10-CM

## 2021-07-16 DIAGNOSIS — I10 Essential (primary) hypertension: Secondary | ICD-10-CM

## 2021-07-16 DIAGNOSIS — J4520 Mild intermittent asthma, uncomplicated: Secondary | ICD-10-CM

## 2021-07-16 NOTE — Telephone Encounter (Signed)
Patient came in stating someone told him he had an appointment today.  Last notes int he system were from 7/18.  Per appointment line for 9/27 appointment, move appointment up and referral was closed.  I gave the patient a print out of his AVS so he can see his next appointment date.

## 2021-07-22 ENCOUNTER — Encounter (INDEPENDENT_AMBULATORY_CARE_PROVIDER_SITE_OTHER): Payer: Self-pay | Admitting: Nurse Practitioner

## 2021-07-22 NOTE — H&P (View-Only) (Signed)
Subjective:    Patient ID: Jeremy Rose, male    DOB: [birth year], 65 y.o.   MRN: [id number] Chief Complaint  Patient presents with   Follow-up    Patient having pain during treatment    Jeremy Rose is a 65 year old male that presents today due to having pain in his left arm during dialysis.  The patient notes that this pain is consistent with the pain he had previously.  He notes that this pain improved during his most recent fistulogram.  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  He also notes that he has been having some clots pulled from his access as well.  He denies any steal like symptoms.  He denies any fevers or chills.  Today noninvasive studies show a flow volume of 2794.  He has an area as well.  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  The aneurysmal area is slightly smaller at 1.9 cm.  There are increased velocities at the preaneurysmal area 398 cm/s.   Review of Systems  Musculoskeletal:        Arm pain during dialysis  Hematological:  Bruises/bleeds easily.  All other systems reviewed and are negative.     Objective:   Physical Exam Vitals reviewed.  HENT:     Head: Normocephalic.  Cardiovascular:     Rate and Rhythm: Normal rate.     Pulses:          Radial pulses are 2+ on the left side.     Arteriovenous access: Left arteriovenous access is present.    Comments: Good thrill and bruit Pulmonary:     Effort: Pulmonary effort is normal.  Musculoskeletal:        General: Normal range of motion.  Skin:    General: Skin is warm and dry.     Capillary Refill: Capillary refill takes less than 2 seconds.  Neurological:     Mental Status: He is alert.  Psychiatric:        Mood and Affect: Mood normal.        Behavior: Behavior normal.        Thought Content: Thought content normal.        Judgment: Judgment normal.    BP 113/68 (BP Location: Right Arm)   Pulse 72   Resp 16   Wt 152 lb  3.2 oz (69 kg)   BMI 23.14 kg/m   Past Medical History:  Diagnosis Date   Arthritis    hands   Asthma    uses inhaler   Coronary artery disease    Dialysis patient [facility]) 2012   Monday, Wednesday, Friday   GERD (gastroesophageal reflux disease)    Gout of right hand    Hypertension    Renal disorder    stage V..on dialysis   Seizures ([place]) 2019   last seizure 5 years ago. 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 Use: Never used  Substance and Sexual Activity   Alcohol use: No   Drug use: No   Sexual activity: Not Currently  Other Topics Concern   Not on file  Social History Narrative   Lives by himself with close neighbors who help him    Social Determinants of Health   Financial Resource Strain: Not on file  Food Insecurity: Not on file  Transportation Needs: Not on file  Physical Activity: Not on file  Stress: Not on file  Social Connections: Not on file  Intimate Partner Violence: Not on file    Past Surgical History:  Procedure Laterality Date   A/V FISTULAGRAM Left 02/24/2017   Procedure: A/V Fistulagram;  Surgeon: Katha Cabal, MD;  Location: [place] [facility];  Service: Cardiovascular;  Laterality: Left;   A/V FISTULAGRAM Left 07/20/2019   Procedure: A/V FISTULAGRAM;  Surgeon: Algernon Huxley, MD;  Location: [place] [facility];  Service: Cardiovascular;  Laterality: Left;   A/V FISTULAGRAM Left 04/03/2020   Procedure: A/V FISTULAGRAM;  Surgeon: Algernon Huxley, MD;  Location: [place] [facility];  Service: Cardiovascular;  Laterality: Left;   A/V FISTULAGRAM Left  09/06/2020   Procedure: A/V FISTULAGRAM;  Surgeon: Algernon Huxley, MD;  Location: [place] [facility];  Service: Cardiovascular;  Laterality: Left;   A/V FISTULAGRAM Left 01/17/2021   Procedure: A/V FISTULAGRAM;  Surgeon: Algernon Huxley, MD;  Location: [place] [facility];  Service: Cardiovascular;  Laterality: Left;   A/V SHUNT INTERVENTION N/A 02/24/2017   Procedure: A/V Shunt Intervention;  Surgeon: Katha Cabal, MD;  Location: [place] [facility];  Service: Cardiovascular;  Laterality: N/A;   A/V SHUNT INTERVENTION N/A 11/14/2019   Procedure: A/V SHUNT INTERVENTION;  Surgeon: Algernon Huxley, MD;  Location: [place] [facility];  Service: Cardiovascular;  Laterality: N/A;   AV FISTULA PLACEMENT Left    AV FISTULA PLACEMENT Left 12/02/2017   Procedure: ARTERIOVENOUS (AV) FISTULA CREATION ( BRACHIAL CEPHALIC );  Surgeon: Katha Cabal, MD;  Location: [facility];  Service: Vascular;  Laterality: Left;   CARDIAC CATHETERIZATION  2012   no stents   COLONOSCOPY Left 09/05/2018   Procedure: COLONOSCOPY;  Surgeon: Virgel Manifold, MD;  Location: [facility] ENDOSCOPY;  Service: Endoscopy;  Laterality: Left;   DIALYSIS/PERMA CATHETER REMOVAL N/A 08/24/2018   Procedure: DIALYSIS/PERMA CATHETER REMOVAL;  Surgeon: Katha Cabal, MD;  Location: [place] [facility];  Service: Cardiovascular;  Laterality: N/A;   ESOPHAGOGASTRODUODENOSCOPY Left 09/05/2018   Procedure: ESOPHAGOGASTRODUODENOSCOPY (EGD);  Surgeon: Virgel Manifold, MD;  Location: [facility] ENDOSCOPY;  Service: Endoscopy;  Laterality: Left;   ESOPHAGOGASTRODUODENOSCOPY (EGD) WITH PROPOFOL N/A 08/31/2018   Procedure: ESOPHAGOGASTRODUODENOSCOPY (EGD) WITH PROPOFOL;  Surgeon: Lucilla Lame, MD;  Location: [facility] ENDOSCOPY;  Service: Endoscopy;  Laterality: N/A;   GIVENS CAPSULE STUDY N/A 09/07/2018   Procedure: GIVENS CAPSULE STUDY;  Surgeon: Jonathon Bellows, MD;  Location: [facility] ENDOSCOPY;  Service: Gastroenterology;  Laterality: N/A;   INSERTION OF  DIALYSIS CATHETER Left 05/28/2018   Procedure: INSERTION OF DIALYSIS CATHETER ( TUNNELED CATH INSERT );  Surgeon: Katha Cabal, MD;  Location: [facility];  Service: Vascular;  Laterality: Left;   LIGATION OF ARTERIOVENOUS  FISTULA Left 12/02/2017   Procedure: LIGATION OF ARTERIOVENOUS  FISTULA ( REMOVAL LEFT WRIST FISTULA );  Surgeon: Katha Cabal, MD;  Location: [facility];  Service: Vascular;  Laterality: Left;   PERIPHERAL VASCULAR CATHETERIZATION Left 06/03/2016   Procedure: A/V Shuntogram/Fistulagram;  Surgeon: Belenda Cruise  Eloise Levels, MD;  Location: [place] [facility];  Service: Cardiovascular;  Laterality: Left;   PERIPHERAL VASCULAR CATHETERIZATION  06/03/2016   Procedure: Peripheral Vascular Balloon Angioplasty;  Surgeon: Katha Cabal, MD;  Location: [place] [facility];  Service: Cardiovascular;;   PERIPHERAL VASCULAR THROMBECTOMY Left 11/14/2019   Procedure: PERIPHERAL VASCULAR THROMBECTOMY;  Surgeon: Algernon Huxley, MD;  Location: [place] [facility];  Service: Cardiovascular;  Laterality: Left;   REVISON OF ARTERIOVENOUS FISTULA Left 05/28/2018   Procedure: REVISON OF ARTERIOVENOUS FISTULA ( REMOVE ANEURYSM );  Surgeon: Katha Cabal, MD;  Location: [facility];  Service: Vascular;  Laterality: Left;   UPPER EXTREMITY VENOGRAPHY N/A 04/13/2018   Procedure: UPPER EXTREMITY VENOGRAPHY;  Surgeon: Katha Cabal, MD;  Location: [place] [facility];  Service: Cardiovascular;  Laterality: N/A;    Family History  Problem Relation Age of Onset   Heart disease Mother    Kidney disease Paternal Uncle     Allergies  Allergen Reactions   Dextromethorphan-Guaifenesin Nausea And Vomiting   Codeine Nausea And Vomiting   Doxycycline Nausea And Vomiting    CBC Latest Ref Rng & Units 09/29/2018 09/24/2018 09/08/2018  WBC 4.0 - 10.5 K/uL 9.1 6.4 -  Hemoglobin 13.0 - 17.0 g/dL 8.6(L) 9.6(L) 6.7(L)  Hematocrit 39.0 - 52.0 % 26.6(L) 29.8(L) -  Platelets 150 - 400 K/uL 229 242 -       CMP     Component Value Date/Time   NA 139 09/29/2018 0404   NA 138 05/18/2014 2142   K 4.1 09/29/2018 0404   K 5.0 06/28/2014 1254   CL 96 (L) 09/29/2018 0404   CL 98 05/18/2014 2142   CO2 31 09/29/2018 0404   CO2 33 (H) 05/18/2014 2142   GLUCOSE 137 (H) 09/29/2018 0404   GLUCOSE 88 05/18/2014 2142   BUN 35 (H) 09/29/2018 0404   BUN 12 05/18/2014 2142   CREATININE 10.81 (H) 09/29/2018 0404   CREATININE 5.73 (H) 05/18/2014 2142   CALCIUM 8.5 (L) 09/29/2018 0404   CALCIUM 8.3 (L) 05/18/2014 2142   PROT 6.9 09/24/2018 1554   PROT 7.2 07/05/2013 1100   ALBUMIN 3.5 09/24/2018 1554   ALBUMIN 4.1 07/05/2013 1100   AST 17 09/24/2018 1554   AST 20 07/05/2013 1100   ALT 11 09/24/2018 1554   ALT 24 07/05/2013 1100   ALKPHOS 81 09/24/2018 1554   ALKPHOS 102 07/05/2013 1100   BILITOT 0.7 09/24/2018 1554   BILITOT 0.4 07/05/2013 1100   GFRNONAA 4 (L) 09/29/2018 0404   GFRNONAA 10 (L) 05/18/2014 2142   GFRAA 5 (L) 09/29/2018 0404   GFRAA 12 (L) 05/18/2014 2142     No results found.     Assessment & Plan:   1. ESRD (end stage renal disease) ([place]) Recommend:  The patient is experiencing increasing problems with their dialysis access.  Patient should have a fistulagram with the intention for intervention.  The intention for intervention is to restore appropriate flow and prevent thrombosis and possible loss of the access.  As well as improve the quality of dialysis therapy.  The risks, benefits and alternative therapies were reviewed in detail with the patient.  All questions were answered.  The patient agrees to proceed with angio/intervention.      2. Essential hypertension Continue antihypertensive medications as already ordered, these medications have been reviewed and there are no changes at this time.   3. Mild intermittent asthma, uncomplicated Continue pulmonary medications and aerosols as already ordered,  these medications have been reviewed and there are no  changes at this time.     Current Outpatient Medications on File Prior to Visit  Medication Sig Dispense Refill   acetaminophen (TYLENOL) 500 MG tablet Take 1 tablet (500 mg total) by mouth every 6 (six) hours as needed for mild pain. 180 tablet 0   albuterol (VENTOLIN HFA) 108 (90 Base) MCG/ACT inhaler Inhale 2 puffs into the lungs every 6 (six) hours as needed for wheezing.      allopurinol (ZYLOPRIM) 100 MG tablet Take 200 mg by mouth daily.      ascorbic acid (VITAMIN C) 500 MG tablet Take 500 mg by mouth daily.     aspirin 81 MG EC tablet Take 81 mg by mouth daily.      beclomethasone (QVAR) 80 MCG/ACT inhaler Inhale 1 puff into the lungs as needed (for shortness of breath).     carvedilol (COREG CR) 10 MG 24 hr capsule Take 10 mg by mouth daily.      carvedilol (COREG) 25 MG tablet Take 25 mg by mouth.      cinacalcet (SENSIPAR) 60 MG tablet Take 180 mg by mouth daily.      cyanocobalamin 1000 MCG tablet Take 1,000 mcg daily by mouth.     felodipine (PLENDIL) 2.5 MG 24 hr tablet Take 2.5 mg by mouth daily.      levETIRAcetam (KEPPRA XR) 500 MG 24 hr tablet Take 500 mg by mouth See admin instructions. Take 500 mg twice a day and takes and additional 500 mg on Mon. Wed and Friday after dialysis     omeprazole (PRILOSEC) 20 MG capsule Take 20 mg by mouth daily as needed (Heartburn).      sevelamer carbonate (RENVELA) 800 MG tablet Take 2,400 mg by mouth 3 (three) times daily with meals.      VIMPAT 100 MG TABS Take 100 mg by mouth See admin instructions. Take 100 mg twice a day and extra 100 mg Mon. Wed. and Friday after dialysys     No current facility-administered medications on file prior to visit.    There are no Patient Instructions on file for this visit. No follow-ups on file.   Kris Hartmann, NP

## 2021-07-22 NOTE — Progress Notes (Signed)
Subjective:    Patient ID: Jeremy Rose, male    DOB: June 02, 1956, 65 y.o.   MRN: [id number] Chief Complaint  Patient presents with   Follow-up    Patient having pain during treatment    Jeremy Rose is a 65 year old male that presents today due to having pain in his left arm during dialysis.  The patient notes that this pain is consistent with the pain he had previously.  He notes that this pain improved during his most recent fistulogram.  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  He also notes that he has been having some clots pulled from his access as well.  He denies any steal like symptoms.  He denies any fevers or chills.  Today noninvasive studies show a flow volume of 2794.  He has an area as well.  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  The aneurysmal area is slightly smaller at 1.9 cm.  There are increased velocities at the preaneurysmal area 398 cm/s.   Review of Systems  Musculoskeletal:        Arm pain during dialysis  Hematological:  Bruises/bleeds easily.  All other systems reviewed and are negative.     Objective:   Physical Exam Vitals reviewed.  HENT:     Head: Normocephalic.  Cardiovascular:     Rate and Rhythm: Normal rate.     Pulses:          Radial pulses are 2+ on the left side.     Arteriovenous access: Left arteriovenous access is present.    Comments: Good thrill and bruit Pulmonary:     Effort: Pulmonary effort is normal.  Musculoskeletal:        General: Normal range of motion.  Skin:    General: Skin is warm and dry.     Capillary Refill: Capillary refill takes less than 2 seconds.  Neurological:     Mental Status: He is alert.  Psychiatric:        Mood and Affect: Mood normal.        Behavior: Behavior normal.        Thought Content: Thought content normal.        Judgment: Judgment normal.    BP 113/68 (BP Location: Right Arm)   Pulse 72   Resp 16   Wt 152 lb  3.2 oz (69 kg)   BMI 23.14 kg/m   Past Medical History:  Diagnosis Date   Arthritis    hands   Asthma    uses inhaler   Coronary artery disease    Dialysis patient [facility]) 2012   Monday, Wednesday, Friday   GERD (gastroesophageal reflux disease)    Gout of right hand    Hypertension    Renal disorder    stage V..on dialysis   Seizures ([place]) 2019   last seizure 5 years ago. 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 Use: Never used  Substance and Sexual Activity   Alcohol use: No   Drug use: No   Sexual activity: Not Currently  Other Topics Concern   Not on file  Social History Narrative   Lives by himself with close neighbors who help him    Social Determinants of Health   Financial Resource Strain: Not on file  Food Insecurity: Not on file  Transportation Needs: Not on file  Physical Activity: Not on file  Stress: Not on file  Social Connections: Not on file  Intimate Partner Violence: Not on file    Past Surgical History:  Procedure Laterality Date   A/V FISTULAGRAM Left 02/24/2017   Procedure: A/V Fistulagram;  Surgeon: Katha Cabal, MD;  Location: [place] [facility];  Service: Cardiovascular;  Laterality: Left;   A/V FISTULAGRAM Left 07/20/2019   Procedure: A/V FISTULAGRAM;  Surgeon: Algernon Huxley, MD;  Location: [place] [facility];  Service: Cardiovascular;  Laterality: Left;   A/V FISTULAGRAM Left 04/03/2020   Procedure: A/V FISTULAGRAM;  Surgeon: Algernon Huxley, MD;  Location: [place] [facility];  Service: Cardiovascular;  Laterality: Left;   A/V FISTULAGRAM Left  09/06/2020   Procedure: A/V FISTULAGRAM;  Surgeon: Algernon Huxley, MD;  Location: [place] [facility];  Service: Cardiovascular;  Laterality: Left;   A/V FISTULAGRAM Left 01/17/2021   Procedure: A/V FISTULAGRAM;  Surgeon: Algernon Huxley, MD;  Location: [place] [facility];  Service: Cardiovascular;  Laterality: Left;   A/V SHUNT INTERVENTION N/A 02/24/2017   Procedure: A/V Shunt Intervention;  Surgeon: Katha Cabal, MD;  Location: [place] [facility];  Service: Cardiovascular;  Laterality: N/A;   A/V SHUNT INTERVENTION N/A 11/14/2019   Procedure: A/V SHUNT INTERVENTION;  Surgeon: Algernon Huxley, MD;  Location: [place] [facility];  Service: Cardiovascular;  Laterality: N/A;   AV FISTULA PLACEMENT Left    AV FISTULA PLACEMENT Left 12/02/2017   Procedure: ARTERIOVENOUS (AV) FISTULA CREATION ( BRACHIAL CEPHALIC );  Surgeon: Katha Cabal, MD;  Location: [facility];  Service: Vascular;  Laterality: Left;   CARDIAC CATHETERIZATION  2012   no stents   COLONOSCOPY Left 09/05/2018   Procedure: COLONOSCOPY;  Surgeon: Virgel Manifold, MD;  Location: [facility] ENDOSCOPY;  Service: Endoscopy;  Laterality: Left;   DIALYSIS/PERMA CATHETER REMOVAL N/A 08/24/2018   Procedure: DIALYSIS/PERMA CATHETER REMOVAL;  Surgeon: Katha Cabal, MD;  Location: [place] [facility];  Service: Cardiovascular;  Laterality: N/A;   ESOPHAGOGASTRODUODENOSCOPY Left 09/05/2018   Procedure: ESOPHAGOGASTRODUODENOSCOPY (EGD);  Surgeon: Virgel Manifold, MD;  Location: [facility] ENDOSCOPY;  Service: Endoscopy;  Laterality: Left;   ESOPHAGOGASTRODUODENOSCOPY (EGD) WITH PROPOFOL N/A 08/31/2018   Procedure: ESOPHAGOGASTRODUODENOSCOPY (EGD) WITH PROPOFOL;  Surgeon: Lucilla Lame, MD;  Location: [facility] ENDOSCOPY;  Service: Endoscopy;  Laterality: N/A;   GIVENS CAPSULE STUDY N/A 09/07/2018   Procedure: GIVENS CAPSULE STUDY;  Surgeon: Jonathon Bellows, MD;  Location: [facility] ENDOSCOPY;  Service: Gastroenterology;  Laterality: N/A;   INSERTION OF  DIALYSIS CATHETER Left 05/28/2018   Procedure: INSERTION OF DIALYSIS CATHETER ( TUNNELED CATH INSERT );  Surgeon: Katha Cabal, MD;  Location: [facility];  Service: Vascular;  Laterality: Left;   LIGATION OF ARTERIOVENOUS  FISTULA Left 12/02/2017   Procedure: LIGATION OF ARTERIOVENOUS  FISTULA ( REMOVAL LEFT WRIST FISTULA );  Surgeon: Katha Cabal, MD;  Location: [facility];  Service: Vascular;  Laterality: Left;   PERIPHERAL VASCULAR CATHETERIZATION Left 06/03/2016   Procedure: A/V Shuntogram/Fistulagram;  Surgeon: Belenda Cruise  Eloise Levels, MD;  Location: [place] [facility];  Service: Cardiovascular;  Laterality: Left;   PERIPHERAL VASCULAR CATHETERIZATION  06/03/2016   Procedure: Peripheral Vascular Balloon Angioplasty;  Surgeon: Katha Cabal, MD;  Location: [place] [facility];  Service: Cardiovascular;;   PERIPHERAL VASCULAR THROMBECTOMY Left 11/14/2019   Procedure: PERIPHERAL VASCULAR THROMBECTOMY;  Surgeon: Algernon Huxley, MD;  Location: [place] [facility];  Service: Cardiovascular;  Laterality: Left;   REVISON OF ARTERIOVENOUS FISTULA Left 05/28/2018   Procedure: REVISON OF ARTERIOVENOUS FISTULA ( REMOVE ANEURYSM );  Surgeon: Katha Cabal, MD;  Location: [facility];  Service: Vascular;  Laterality: Left;   UPPER EXTREMITY VENOGRAPHY N/A 04/13/2018   Procedure: UPPER EXTREMITY VENOGRAPHY;  Surgeon: Katha Cabal, MD;  Location: [place] [facility];  Service: Cardiovascular;  Laterality: N/A;    Family History  Problem Relation Age of Onset   Heart disease Mother    Kidney disease Paternal Uncle     Allergies  Allergen Reactions   Dextromethorphan-Guaifenesin Nausea And Vomiting   Codeine Nausea And Vomiting   Doxycycline Nausea And Vomiting    CBC Latest Ref Rng & Units 09/29/2018 09/24/2018 09/08/2018  WBC 4.0 - 10.5 K/uL 9.1 6.4 -  Hemoglobin 13.0 - 17.0 g/dL 8.6(L) 9.6(L) 6.7(L)  Hematocrit 39.0 - 52.0 % 26.6(L) 29.8(L) -  Platelets 150 - 400 K/uL 229 242 -       CMP     Component Value Date/Time   NA 139 09/29/2018 0404   NA 138 05/18/2014 2142   K 4.1 09/29/2018 0404   K 5.0 06/28/2014 1254   CL 96 (L) 09/29/2018 0404   CL 98 05/18/2014 2142   CO2 31 09/29/2018 0404   CO2 33 (H) 05/18/2014 2142   GLUCOSE 137 (H) 09/29/2018 0404   GLUCOSE 88 05/18/2014 2142   BUN 35 (H) 09/29/2018 0404   BUN 12 05/18/2014 2142   CREATININE 10.81 (H) 09/29/2018 0404   CREATININE 5.73 (H) 05/18/2014 2142   CALCIUM 8.5 (L) 09/29/2018 0404   CALCIUM 8.3 (L) 05/18/2014 2142   PROT 6.9 09/24/2018 1554   PROT 7.2 07/05/2013 1100   ALBUMIN 3.5 09/24/2018 1554   ALBUMIN 4.1 07/05/2013 1100   AST 17 09/24/2018 1554   AST 20 07/05/2013 1100   ALT 11 09/24/2018 1554   ALT 24 07/05/2013 1100   ALKPHOS 81 09/24/2018 1554   ALKPHOS 102 07/05/2013 1100   BILITOT 0.7 09/24/2018 1554   BILITOT 0.4 07/05/2013 1100   GFRNONAA 4 (L) 09/29/2018 0404   GFRNONAA 10 (L) 05/18/2014 2142   GFRAA 5 (L) 09/29/2018 0404   GFRAA 12 (L) 05/18/2014 2142     No results found.     Assessment & Plan:   1. ESRD (end stage renal disease) ([place]) Recommend:  The patient is experiencing increasing problems with their dialysis access.  Patient should have a fistulagram with the intention for intervention.  The intention for intervention is to restore appropriate flow and prevent thrombosis and possible loss of the access.  As well as improve the quality of dialysis therapy.  The risks, benefits and alternative therapies were reviewed in detail with the patient.  All questions were answered.  The patient agrees to proceed with angio/intervention.      2. Essential hypertension Continue antihypertensive medications as already ordered, these medications have been reviewed and there are no changes at this time.   3. Mild intermittent asthma, uncomplicated Continue pulmonary medications and aerosols as already ordered,  these medications have been reviewed and there are no  changes at this time.     Current Outpatient Medications on File Prior to Visit  Medication Sig Dispense Refill   acetaminophen (TYLENOL) 500 MG tablet Take 1 tablet (500 mg total) by mouth every 6 (six) hours as needed for mild pain. 180 tablet 0   albuterol (VENTOLIN HFA) 108 (90 Base) MCG/ACT inhaler Inhale 2 puffs into the lungs every 6 (six) hours as needed for wheezing.      allopurinol (ZYLOPRIM) 100 MG tablet Take 200 mg by mouth daily.      ascorbic acid (VITAMIN C) 500 MG tablet Take 500 mg by mouth daily.     aspirin 81 MG EC tablet Take 81 mg by mouth daily.      beclomethasone (QVAR) 80 MCG/ACT inhaler Inhale 1 puff into the lungs as needed (for shortness of breath).     carvedilol (COREG CR) 10 MG 24 hr capsule Take 10 mg by mouth daily.      carvedilol (COREG) 25 MG tablet Take 25 mg by mouth.      cinacalcet (SENSIPAR) 60 MG tablet Take 180 mg by mouth daily.      cyanocobalamin 1000 MCG tablet Take 1,000 mcg daily by mouth.     felodipine (PLENDIL) 2.5 MG 24 hr tablet Take 2.5 mg by mouth daily.      levETIRAcetam (KEPPRA XR) 500 MG 24 hr tablet Take 500 mg by mouth See admin instructions. Take 500 mg twice a day and takes and additional 500 mg on Mon. Wed and Friday after dialysis     omeprazole (PRILOSEC) 20 MG capsule Take 20 mg by mouth daily as needed (Heartburn).      sevelamer carbonate (RENVELA) 800 MG tablet Take 2,400 mg by mouth 3 (three) times daily with meals.      VIMPAT 100 MG TABS Take 100 mg by mouth See admin instructions. Take 100 mg twice a day and extra 100 mg Mon. Wed. and Friday after dialysys     No current facility-administered medications on file prior to visit.    There are no Patient Instructions on file for this visit. No follow-ups on file.   Kris Hartmann, NP

## 2021-07-26 ENCOUNTER — Encounter (INDEPENDENT_AMBULATORY_CARE_PROVIDER_SITE_OTHER): Payer: Self-pay

## 2021-08-08 ENCOUNTER — Other Ambulatory Visit (INDEPENDENT_AMBULATORY_CARE_PROVIDER_SITE_OTHER): Payer: Self-pay | Admitting: Nurse Practitioner

## 2021-08-08 ENCOUNTER — Encounter: Payer: Self-pay | Admitting: Vascular Surgery

## 2021-08-08 ENCOUNTER — Ambulatory Visit
Admission: RE | Admit: 2021-08-08 | Discharge: 2021-08-08 | Disposition: A | Discharge status: Home / Self Care | Payer: Medicare PPO | Source: Ambulatory Visit | Attending: Vascular Surgery | Admitting: Vascular Surgery

## 2021-08-08 ENCOUNTER — Encounter
Admission: RE | Disposition: A | Discharge status: Home / Self Care | Payer: Self-pay | Source: Ambulatory Visit | Attending: Vascular Surgery

## 2021-08-08 ENCOUNTER — Other Ambulatory Visit: Payer: Self-pay

## 2021-08-08 DIAGNOSIS — Z885 Allergy status to narcotic agent status: Secondary | ICD-10-CM | POA: Diagnosis not present

## 2021-08-08 DIAGNOSIS — Z79899 Other long term (current) drug therapy: Secondary | ICD-10-CM | POA: Diagnosis not present

## 2021-08-08 DIAGNOSIS — T82510A Breakdown (mechanical) of surgically created arteriovenous fistula, initial encounter: Secondary | ICD-10-CM | POA: Insufficient documentation

## 2021-08-08 DIAGNOSIS — J45909 Unspecified asthma, uncomplicated: Secondary | ICD-10-CM | POA: Insufficient documentation

## 2021-08-08 DIAGNOSIS — Z7982 Long term (current) use of aspirin: Secondary | ICD-10-CM | POA: Diagnosis not present

## 2021-08-08 DIAGNOSIS — Y841 Kidney dialysis as the cause of abnormal reaction of the patient, or of later complication, without mention of misadventure at the time of the procedure: Secondary | ICD-10-CM | POA: Insufficient documentation

## 2021-08-08 DIAGNOSIS — I120 Hypertensive chronic kidney disease with stage 5 chronic kidney disease or end stage renal disease: Secondary | ICD-10-CM | POA: Diagnosis not present

## 2021-08-08 DIAGNOSIS — Z881 Allergy status to other antibiotic agents status: Secondary | ICD-10-CM | POA: Insufficient documentation

## 2021-08-08 DIAGNOSIS — Z87891 Personal history of nicotine dependence: Secondary | ICD-10-CM | POA: Diagnosis not present

## 2021-08-08 DIAGNOSIS — Z992 Dependence on renal dialysis: Secondary | ICD-10-CM | POA: Diagnosis not present

## 2021-08-08 DIAGNOSIS — Z888 Allergy status to other drugs, medicaments and biological substances status: Secondary | ICD-10-CM | POA: Diagnosis not present

## 2021-08-08 DIAGNOSIS — N186 End stage renal disease: Secondary | ICD-10-CM | POA: Diagnosis not present

## 2021-08-08 HISTORY — PX: A/V FISTULAGRAM: CATH118298

## 2021-08-08 LAB — POTASSIUM (ARMC VASCULAR LAB ONLY): Potassium (ARMC vascular lab): 5.1 (ref 3.5–5.1)

## 2021-08-08 SURGERY — A/V FISTULAGRAM
Anesthesia: Moderate Sedation | Laterality: Left

## 2021-08-08 MED ORDER — DIPHENHYDRAMINE HCL 50 MG/ML IJ SOLN: 50.0000 mg | Freq: Once | INTRAMUSCULAR | Status: DC | PRN

## 2021-08-08 MED ORDER — MIDAZOLAM HCL 2 MG/ML PO SYRP: 8.0000 mg | ORAL_SOLUTION | Freq: Once | ORAL | Status: DC | PRN

## 2021-08-08 MED ORDER — FENTANYL CITRATE (PF) 100 MCG/2ML IJ SOLN
INTRAMUSCULAR | Status: AC
  Filled 2021-08-08: qty 2

## 2021-08-08 MED ORDER — SODIUM CHLORIDE 0.9 % IV SOLN
INTRAVENOUS | Status: DC
Start: 2021-08-08 — End: 2021-08-08

## 2021-08-08 MED ORDER — IODIXANOL 320 MG/ML IV SOLN
INTRAVENOUS | Status: DC | PRN
  Administered 2021-08-08: 20 mL

## 2021-08-08 MED ORDER — CEFAZOLIN SODIUM-DEXTROSE 2-4 GM/100ML-% IV SOLN
INTRAVENOUS | Status: AC
  Filled 2021-08-08: qty 100

## 2021-08-08 MED ORDER — HYDROMORPHONE HCL 1 MG/ML IJ SOLN: 1.0000 mg | Freq: Once | INTRAMUSCULAR | Status: DC | PRN

## 2021-08-08 MED ORDER — CEFAZOLIN SODIUM-DEXTROSE 1-4 GM/50ML-% IV SOLN
1.0000 g | Freq: Once | INTRAVENOUS | Status: AC
  Administered 2021-08-08: 1 g via INTRAVENOUS

## 2021-08-08 MED ORDER — MIDAZOLAM HCL 2 MG/2ML IJ SOLN
INTRAMUSCULAR | Status: DC | PRN
  Administered 2021-08-08: 2 mg via INTRAVENOUS

## 2021-08-08 MED ORDER — HEPARIN SODIUM (PORCINE) 1000 UNIT/ML IJ SOLN
INTRAMUSCULAR | Status: DC | PRN
  Administered 2021-08-08: 3000 [IU] via INTRAVENOUS

## 2021-08-08 MED ORDER — HEPARIN SODIUM (PORCINE) 1000 UNIT/ML IJ SOLN
INTRAMUSCULAR | Status: AC
  Filled 2021-08-08: qty 1

## 2021-08-08 MED ORDER — METHYLPREDNISOLONE SODIUM SUCC 125 MG IJ SOLR: 125.0000 mg | Freq: Once | INTRAMUSCULAR | Status: DC | PRN

## 2021-08-08 MED ORDER — FENTANYL CITRATE (PF) 100 MCG/2ML IJ SOLN
INTRAMUSCULAR | Status: DC | PRN
  Administered 2021-08-08: 50 ug via INTRAVENOUS

## 2021-08-08 MED ORDER — ONDANSETRON HCL 4 MG/2ML IJ SOLN: 4.0000 mg | Freq: Four times a day (QID) | INTRAMUSCULAR | Status: DC | PRN

## 2021-08-08 MED ORDER — FAMOTIDINE 20 MG PO TABS: 40.0000 mg | ORAL_TABLET | Freq: Once | ORAL | Status: DC | PRN

## 2021-08-08 MED ORDER — MIDAZOLAM HCL 2 MG/2ML IJ SOLN
INTRAMUSCULAR | Status: AC
  Filled 2021-08-08: qty 2

## 2021-08-08 SURGICAL SUPPLY — 11 items
BALLN ULTRVRSE 10X60X75 (BALLOONS) ×2
BALLOON ULTRVRSE 10X60X75 (BALLOONS) IMPLANT
CANNULA 5F STIFF (CANNULA) ×1 IMPLANT
COVER PROBE U/S 5X48 (MISCELLANEOUS) ×1 IMPLANT
DRAPE BRACHIAL (DRAPES) ×1 IMPLANT
GLIDEWIRE ADV .035X180CM (WIRE) ×1 IMPLANT
KIT ENCORE 26 ADVANTAGE (KITS) ×1 IMPLANT
PACK ANGIOGRAPHY (CUSTOM PROCEDURE TRAY) ×2 IMPLANT
SHEATH BRITE TIP 6FRX5.5 (SHEATH) ×1 IMPLANT
SUT MNCRL AB 4-0 PS2 18 (SUTURE) ×1 IMPLANT
TOWEL OR 17X26 4PK STRL BLUE (TOWEL DISPOSABLE) ×1 IMPLANT

## 2021-08-08 NOTE — Op Note (Signed)
[facility]    OPERATIVE NOTE   PROCEDURE: 1.   Left brachiocephalic arteriovenous fistula cannulation under ultrasound guidance 2.   Left arm fistulagram including central venogram 3.   Percutaneous transluminal angioplasty of proximal upper arm cephalic vein just proximal and distal to the large aneurysm with 10 mm diameter angioplasty balloon  PRE-OPERATIVE DIAGNOSIS: 1. ESRD 2. aneurysmal left brachiocephalic AVF  POST-OPERATIVE DIAGNOSIS: same as above   SURGEON: [name], MD  ANESTHESIA: local with MCS  ESTIMATED BLOOD LOSS: 3 cc  FINDING(S): Large brachiocephalic AV fistula with greater than 70% narrowing just distal to the aneurysm and the fistula in the proximal to mid upper arm cephalic vein.  There also appeared to be a ringlike stenosis just proximal to the aneurysm although this may have been redundancy.  The remainder of the central venous circulation was widely patent.  SPECIMEN(S):  None  CONTRAST: 20 cc  FLUORO TIME: 1.6 minutes  MODERATE CONSCIOUS SEDATION TIME: Approximately 10 minutes with 2 mg of Versed and 50 mcg of Fentanyl   INDICATIONS: Jeremy Rose is a 65 y.o. male who presents with malfunctioning aneurysmal left brachiocephalic arteriovenous fistula.  The patient is scheduled for left arm fistulagram.  The patient is aware the risks include but are not limited to: bleeding, infection, thrombosis of the cannulated access, and possible anaphylactic reaction to the contrast.  The patient is aware of the risks of the procedure and elects to proceed forward.  DESCRIPTION: After full informed written consent was obtained, the patient was brought back to the angiography suite and placed supine upon the angiography table.  The patient was connected to monitoring equipment. Moderate conscious sedation was administered with a face to face encounter with the patient throughout the procedure with my supervision of the RN  administering medicines and monitoring the patient's vital signs and mental status throughout from the start of the procedure until the patient was taken to the recovery room. The left arm was prepped and draped in the standard fashion for a percutaneous access intervention.  Under ultrasound guidance, the left brachiocephalic arteriovenous fistula was cannulated with a micropuncture needle under direct ultrasound guidance where it was patent and a permanent image was performed.  The microwire was advanced into the fistula and the needle was exchanged for the a microsheath.  I then upsized to a 6 Fr Sheath and imaging was performed.  Hand injections were completed to image the access including the central venous system. This demonstrated Large brachiocephalic AV fistula with greater than 70% narrowing just distal to the aneurysm and the fistula in the proximal to mid upper arm cephalic vein.  There also appeared to be a ringlike stenosis just proximal to the aneurysm although this may have been redundancy.  The remainder of the central venous circulation was widely patent.  Based on the images, this patient will need intervention to the perianastomotic area. I then gave the patient 3000 units of intravenous heparin.  I then crossed the stenosis and the aneurysm with an advantage wire.  Based on the imaging, a 10 mm x 6 cm   angioplasty balloon was selected.  The balloon was centered around the apparent stenosis just proximal to the aneurysmal site and inflated to 8 ATM for 1 minute(s).  This really did not waste that much and may have just been a redundant area.  The balloon was then pulled back to treat the clear stenosis just distal to the aneurysmal area in the proximal  to mid upper arm cephalic vein.  There was a very tight waist that broke at 10 atm and this was held for 1 minute.  On completion imaging, a 15-20% residual stenosis was present.     Based on the completion imaging, no further intervention is  necessary.  The wire and balloon were removed from the sheath.  A 4-0 Monocryl purse-string suture was sewn around the sheath.  The sheath was removed while tying down the suture.  A sterile bandage was applied to the puncture site.  COMPLICATIONS: None  CONDITION: Stable   [name]  08/08/2021 8:38 AM   This note was created with Dragon Medical transcription system. Any errors in dictation are purely unintentional.

## 2021-08-08 NOTE — Interval H&P Note (Signed)
History and Physical Interval Note:  08/08/2021 8:14 AM  Jeremy Rose  has presented today for surgery, with the diagnosis of LT arm fistulagram   End Stage Renal.  The various methods of treatment have been discussed with the patient and family. After consideration of risks, benefits and other options for treatment, the patient has consented to  Procedure(s): A/V FISTULAGRAM (Left) as a surgical intervention.  The patient's history has been reviewed, patient examined, no change in status, stable for surgery.  I have reviewed the patient's chart and labs.  Questions were answered to the patient's satisfaction.     [name]

## 2021-08-08 NOTE — Progress Notes (Signed)
Thrill noted to be felt in LUE fistula.

## 2021-08-13 ENCOUNTER — Ambulatory Visit (INDEPENDENT_AMBULATORY_CARE_PROVIDER_SITE_OTHER): Payer: Medicare PPO | Admitting: Nurse Practitioner

## 2021-08-13 ENCOUNTER — Encounter (INDEPENDENT_AMBULATORY_CARE_PROVIDER_SITE_OTHER): Payer: Medicare PPO

## 2021-09-03 ENCOUNTER — Ambulatory Visit (INDEPENDENT_AMBULATORY_CARE_PROVIDER_SITE_OTHER): Payer: Medicare PPO | Admitting: Nurse Practitioner

## 2021-09-03 ENCOUNTER — Encounter (INDEPENDENT_AMBULATORY_CARE_PROVIDER_SITE_OTHER): Payer: Medicare PPO

## 2021-09-16 ENCOUNTER — Other Ambulatory Visit (INDEPENDENT_AMBULATORY_CARE_PROVIDER_SITE_OTHER): Payer: Self-pay | Admitting: Vascular Surgery

## 2021-09-16 DIAGNOSIS — Z9862 Peripheral vascular angioplasty status: Secondary | ICD-10-CM

## 2021-09-16 DIAGNOSIS — N186 End stage renal disease: Secondary | ICD-10-CM

## 2021-09-17 ENCOUNTER — Ambulatory Visit (INDEPENDENT_AMBULATORY_CARE_PROVIDER_SITE_OTHER): Payer: Medicare PPO | Admitting: Vascular Surgery

## 2021-09-17 ENCOUNTER — Ambulatory Visit (INDEPENDENT_AMBULATORY_CARE_PROVIDER_SITE_OTHER): Payer: Medicare PPO

## 2021-09-17 ENCOUNTER — Other Ambulatory Visit: Payer: Self-pay

## 2021-09-17 ENCOUNTER — Encounter (INDEPENDENT_AMBULATORY_CARE_PROVIDER_SITE_OTHER): Payer: Self-pay | Admitting: Vascular Surgery

## 2021-09-17 VITALS — BP 112/66 | HR 76 | Resp 16 | Wt 148.0 lb

## 2021-09-17 DIAGNOSIS — Z992 Dependence on renal dialysis: Secondary | ICD-10-CM

## 2021-09-17 DIAGNOSIS — Z9862 Peripheral vascular angioplasty status: Secondary | ICD-10-CM

## 2021-09-17 DIAGNOSIS — T829XXS Unspecified complication of cardiac and vascular prosthetic device, implant and graft, sequela: Secondary | ICD-10-CM

## 2021-09-17 DIAGNOSIS — N186 End stage renal disease: Secondary | ICD-10-CM | POA: Diagnosis not present

## 2021-09-17 DIAGNOSIS — I10 Essential (primary) hypertension: Secondary | ICD-10-CM | POA: Diagnosis not present

## 2021-09-17 NOTE — Assessment & Plan Note (Signed)
Using his left arm access which we are planning revising as above.

## 2021-09-17 NOTE — Assessment & Plan Note (Signed)
The patient has a marked aneurysmal degeneration of the left brachiocephalic AV fistula.  Although there is not any immediate skin threat, this is very painful to the patient and he desires to have it revised and excised.  That seems reasonable.  We discussed that he will need a PermCath in the interim while the wound is healing.  It will be probably 2 to 4 weeks before he can begin using the revised access.  Risks and benefits were discussed and he is agreeable to proceed.

## 2021-09-17 NOTE — Assessment & Plan Note (Signed)
blood pressure control important in reducing the progression of atherosclerotic disease. On appropriate oral medications.  

## 2021-09-17 NOTE — Progress Notes (Signed)
MRN : [id number]  Jeremy Rose is a 65 y.o. ([birth year]) male who presents with chief complaint of  Chief Complaint  Patient presents with   Follow-up    [facility] 6wk post a/v fistulagram  .  History of Present Illness: Patient returns today in follow up of his dialysis access.  He had an intervention to a stenosis just distal to a large aneurysm of his brachiocephalic AV fistula a few weeks ago.  His fistula is still being used for dialysis and is working well, but it is quite painful to him.  There is not any immediate skin threat but the aneurysm is large.  Duplex today shows resolution of the stenosis with just the aneurysmal degeneration of the fistula.  Current Outpatient Medications  Medication Sig Dispense Refill   acetaminophen (TYLENOL) 500 MG tablet Take 1 tablet (500 mg total) by mouth every 6 (six) hours as needed for mild pain. 180 tablet 0   albuterol (VENTOLIN HFA) 108 (90 Base) MCG/ACT inhaler Inhale 2 puffs into the lungs every 6 (six) hours as needed for wheezing.      allopurinol (ZYLOPRIM) 100 MG tablet Take 200 mg by mouth daily.      ascorbic acid (VITAMIN C) 500 MG tablet Take 500 mg by mouth daily.     beclomethasone (QVAR) 80 MCG/ACT inhaler Inhale 1 puff into the lungs as needed (for shortness of breath).     carvedilol (COREG CR) 10 MG 24 hr capsule Take 10 mg by mouth daily.      carvedilol (COREG) 25 MG tablet Take 25 mg by mouth.      cinacalcet (SENSIPAR) 60 MG tablet Take 180 mg by mouth daily.      cyanocobalamin 1000 MCG tablet Take 1,000 mcg daily by mouth.     felodipine (PLENDIL) 2.5 MG 24 hr tablet Take 2.5 mg by mouth daily.      levETIRAcetam (KEPPRA XR) 500 MG 24 hr tablet Take 500 mg by mouth See admin instructions. Take 500 mg twice a day and takes and additional 500 mg on Mon. Wed and Friday after dialysis     omeprazole (PRILOSEC) 20 MG capsule Take 20 mg by mouth daily as needed (Heartburn).      sevelamer carbonate (RENVELA) 800 MG  tablet Take 2,400 mg by mouth 3 (three) times daily with meals.      VIMPAT 100 MG TABS Take 100 mg by mouth See admin instructions. Take 100 mg twice a day and extra 100 mg Mon. Wed. and Friday after dialysys     aspirin 81 MG EC tablet Take 81 mg by mouth daily.  (Patient not taking: No sig reported)     No current facility-administered medications for this visit.    Past Medical History:  Diagnosis Date   Arthritis    hands   Asthma    uses inhaler   Coronary artery disease    Dialysis patient [facility]) 2012   Monday, Wednesday, Friday   GERD (gastroesophageal reflux disease)    Gout of right hand    Hypertension    Renal disorder    stage V..on dialysis   Seizures ([place]) 2019   last seizure 5 years ago. had 20 in one day    Past Surgical History:  Procedure Laterality Date   A/V FISTULAGRAM Left 02/24/2017   Procedure: A/V Fistulagram;  Surgeon: Katha Cabal, MD;  Location: [place] [facility];  Service: Cardiovascular;  Laterality: Left;   A/V FISTULAGRAM  Left 07/20/2019   Procedure: A/V FISTULAGRAM;  Surgeon: Algernon Huxley, MD;  Location: [place] [facility];  Service: Cardiovascular;  Laterality: Left;   A/V FISTULAGRAM Left 04/03/2020   Procedure: A/V FISTULAGRAM;  Surgeon: Algernon Huxley, MD;  Location: [place] [facility];  Service: Cardiovascular;  Laterality: Left;   A/V FISTULAGRAM Left 09/06/2020   Procedure: A/V FISTULAGRAM;  Surgeon: Algernon Huxley, MD;  Location: [place] [facility];  Service: Cardiovascular;  Laterality: Left;   A/V FISTULAGRAM Left 01/17/2021   Procedure: A/V FISTULAGRAM;  Surgeon: Algernon Huxley, MD;  Location: [place] [facility];  Service: Cardiovascular;  Laterality: Left;   A/V FISTULAGRAM Left 08/08/2021   Procedure: A/V FISTULAGRAM;  Surgeon: Algernon Huxley, MD;  Location: [place] [facility];  Service: Cardiovascular;  Laterality: Left;   A/V SHUNT INTERVENTION N/A 02/24/2017   Procedure: A/V Shunt Intervention;  Surgeon: Katha Cabal, MD;   Location: [place] [facility];  Service: Cardiovascular;  Laterality: N/A;   A/V SHUNT INTERVENTION N/A 11/14/2019   Procedure: A/V SHUNT INTERVENTION;  Surgeon: Algernon Huxley, MD;  Location: [place] [facility];  Service: Cardiovascular;  Laterality: N/A;   AV FISTULA PLACEMENT Left    AV FISTULA PLACEMENT Left 12/02/2017   Procedure: ARTERIOVENOUS (AV) FISTULA CREATION ( BRACHIAL CEPHALIC );  Surgeon: Katha Cabal, MD;  Location: [facility];  Service: Vascular;  Laterality: Left;   CARDIAC CATHETERIZATION  2012   no stents   COLONOSCOPY Left 09/05/2018   Procedure: COLONOSCOPY;  Surgeon: Virgel Manifold, MD;  Location: [facility] ENDOSCOPY;  Service: Endoscopy;  Laterality: Left;   DIALYSIS/PERMA CATHETER REMOVAL N/A 08/24/2018   Procedure: DIALYSIS/PERMA CATHETER REMOVAL;  Surgeon: Katha Cabal, MD;  Location: [place] [facility];  Service: Cardiovascular;  Laterality: N/A;   ESOPHAGOGASTRODUODENOSCOPY Left 09/05/2018   Procedure: ESOPHAGOGASTRODUODENOSCOPY (EGD);  Surgeon: Virgel Manifold, MD;  Location: [facility] ENDOSCOPY;  Service: Endoscopy;  Laterality: Left;   ESOPHAGOGASTRODUODENOSCOPY (EGD) WITH PROPOFOL N/A 08/31/2018   Procedure: ESOPHAGOGASTRODUODENOSCOPY (EGD) WITH PROPOFOL;  Surgeon: Lucilla Lame, MD;  Location: [facility] ENDOSCOPY;  Service: Endoscopy;  Laterality: N/A;   GIVENS CAPSULE STUDY N/A 09/07/2018   Procedure: GIVENS CAPSULE STUDY;  Surgeon: Jonathon Bellows, MD;  Location: [facility] ENDOSCOPY;  Service: Gastroenterology;  Laterality: N/A;   INSERTION OF DIALYSIS CATHETER Left 05/28/2018   Procedure: INSERTION OF DIALYSIS CATHETER ( TUNNELED CATH INSERT );  Surgeon: Katha Cabal, MD;  Location: [facility] ORS;  Service: Vascular;  Laterality: Left;   LIGATION OF ARTERIOVENOUS  FISTULA Left 12/02/2017   Procedure: LIGATION OF ARTERIOVENOUS  FISTULA ( REMOVAL LEFT WRIST FISTULA );  Surgeon: Katha Cabal, MD;  Location: [facility] ORS;  Service: Vascular;  Laterality: Left;    PERIPHERAL VASCULAR CATHETERIZATION Left 06/03/2016   Procedure: A/V Shuntogram/Fistulagram;  Surgeon: Katha Cabal, MD;  Location: [place] [facility] LAB;  Service: Cardiovascular;  Laterality: Left;   PERIPHERAL VASCULAR CATHETERIZATION  06/03/2016   Procedure: Peripheral Vascular Balloon Angioplasty;  Surgeon: Katha Cabal, MD;  Location: [place] CV LAB;  Service: Cardiovascular;;   PERIPHERAL VASCULAR THROMBECTOMY Left 11/14/2019   Procedure: PERIPHERAL VASCULAR THROMBECTOMY;  Surgeon: Algernon Huxley, MD;  Location: [place] CV LAB;  Service: Cardiovascular;  Laterality: Left;   REVISON OF ARTERIOVENOUS FISTULA Left 05/28/2018   Procedure: REVISON OF ARTERIOVENOUS FISTULA ( REMOVE ANEURYSM );  Surgeon: Katha Cabal, MD;  Location: [facility] ORS;  Service: Vascular;  Laterality: Left;   UPPER EXTREMITY  VENOGRAPHY N/A 04/13/2018   Procedure: UPPER EXTREMITY VENOGRAPHY;  Surgeon: Katha Cabal, MD;  Location: [place] CV LAB;  Service: Cardiovascular;  Laterality: N/A;     Social History   Tobacco Use   Smoking status: Former    Types: Cigarettes   Smokeless tobacco: Former    Quit date: 2010   Tobacco comments:    did not inhale!!  Scientific laboratory technician Use: Never used  Substance Use Topics   Alcohol use: No   Drug use: No      Family History  Problem Relation Age of Onset   Heart disease Mother    Kidney disease Paternal Uncle      Allergies  Allergen Reactions   Dextromethorphan-Guaifenesin Nausea And Vomiting   Codeine Nausea And Vomiting   Doxycycline Nausea And Vomiting     REVIEW OF SYSTEMS (Negative unless checked)  Constitutional: [] Weight loss  [] Fever  [] Chills Cardiac: [] Chest pain   [] Chest pressure   [] Palpitations   [] Shortness of breath when laying flat   [] Shortness of breath at rest   [] Shortness of breath with exertion. Vascular:  [] Pain in legs with walking   [] Pain in legs at rest   [] Pain in legs when laying flat    [] Claudication   [] Pain in feet when walking  [] Pain in feet at rest  [] Pain in feet when laying flat   [] History of DVT   [] Phlebitis   [] Swelling in legs   [] Varicose veins   [] Non-healing ulcers Pulmonary:   [] Uses home oxygen   [] Productive cough   [] Hemoptysis   [] Wheeze  [] COPD   [x] Asthma Neurologic:  [] Dizziness  [] Blackouts   [x] Seizures   [] History of stroke   [] History of TIA  [] Aphasia   [] Temporary blindness   [] Dysphagia   [] Weakness or numbness in arms   [] Weakness or numbness in legs Musculoskeletal:  [x] Arthritis   [] Joint swelling   [x] Joint pain   [] Low back pain Hematologic:  [] Easy bruising  [] Easy bleeding   [] Hypercoagulable state   [] Anemic   Gastrointestinal:  [] Blood in stool   [] Vomiting blood  [x] Gastroesophageal reflux/heartburn   [] Abdominal pain Genitourinary:  [x] Chronic kidney disease   [] Difficult urination  [] Frequent urination  [] Burning with urination   [] Hematuria Skin:  [] Rashes   [] Ulcers   [] Wounds Psychological:  [] History of anxiety   []  History of major depression.  Physical Examination  BP 112/66 (BP Location: Right Arm)   Pulse 76   Resp 16   Wt 148 lb (67.1 kg)   BMI 22.50 kg/m  Gen:  WD/WN, NAD Head: [place]/AT, No temporalis wasting. Ear/Nose/Throat: Hearing grossly intact, nares w/o erythema or drainage Eyes: Conjunctiva clear. Sclera non-icteric Neck: Supple.  Trachea midline Pulmonary:  Good air movement, no use of accessory muscles.  Cardiac: RRR, no JVD Vascular: Left brachiocephalic AV fistula is widely patent with good thrill but a significantly enlarged aneurysm is present in the proximal upper arm cephalic vein. Vessel Right Left  Radial Palpable Palpable               Musculoskeletal: M/S 5/5 throughout.  No deformity or atrophy.  No edema. Neurologic: Sensation grossly intact in extremities.  Symmetrical.  Speech is fluent.  Psychiatric: Judgment intact, Mood & affect appropriate for pt's clinical situation. Dermatologic: No  rashes or ulcers noted.  No cellulitis or open wounds.      Labs Recent Results (from the past 2160 hour(s))  Pot[facility] vascular lab only)  Status: None   Collection Time: 08/08/21  7:17 AM  Result Value Ref Range   Potassium [facility] vascular lab) 5.1 3.5 - 5.1    Comment: Performed at [facility], [address] [place], [place] [address]    Radiology VAS [place] (AVF, AVG)  Result Date: 09/17/2021 DIALYSIS ACCESS Patient Name:  AFSHIN CHRYSTAL  Date of Exam:   09/17/2021 Medical Rec #: [id number]           Accession #:    [id number] Date of Birth: 1956/05/14          Patient Gender: M Patient Age:   77 years Exam [facility] Procedure:      VAS US DUPLEX DIALYSIS ACCESS (AVF, AVG) Referring Phys: [name] --------------------------------------------------------------------------------  Access Site: Left Upper Extremity. Access Type: Brachial-cephalic AVF. History: End stage renal disease requiring chronic dialysis.          11/03/2019 Left BrachCeph thrombectomy          04/03/2020 fistulagram with no intervention.           08/08/2021: Left Arm Fistula including Central Venogram. PTA of          Proximal Upper Arm Cephalic vein just proximal and Distal to the large          Aneurysm with 10 mm diameter angioplasty balloon.           01/17/2021: Left Arm Fistula including Central Venogram. PTA of          Proximal Upper Arm Cephalic Vein with 30mm diameter by 6cm length          Lutonix drug -coated angioplasty balloon. Comparison Study: 07/16/2021 Performing Technologist: Almira Coaster RVS  Examination Guidelines: A complete evaluation includes B-mode imaging, spectral Doppler, color Doppler, and power Doppler as needed of all accessible portions of each vessel. Unilateral testing is considered an integral part of a complete examination. Limited examinations for reoccurring indications may be performed as noted.  Findings:  +--------------------+----------+-----------------+--------+ AVF                 PSV (cm/s)Flow Vol (mL/min)Comments +--------------------+----------+-----------------+--------+ Native artery inflow    34          2167                +--------------------+----------+-----------------+--------+ AVF Anastomosis        160                              +--------------------+----------+-----------------+--------+  +---------------+----------+-------------+----------+--------+ OUTFLOW VEIN   PSV (cm/s)Diameter (cm)Depth (cm)Describe +---------------+----------+-------------+----------+--------+ Subclavian vein   107                                    +---------------+----------+-------------+----------+--------+ Confluence         83                                    +---------------+----------+-------------+----------+--------+ Shoulder          143                                    +---------------+----------+-------------+----------+--------+ Prox UA  401                                    +---------------+----------+-------------+----------+--------+ Mid UA            416                                    +---------------+----------+-------------+----------+--------+ Dist UA           265                                    +---------------+----------+-------------+----------+--------+  +--------------+-------------+---------+---------+---------+-------------------+               Diameter (cm)  Depth  Branching   PSV       Flow Volume                                  (cm)             (cm/s)       (ml/min)       +--------------+-------------+---------+---------+---------+-------------------+ Lt Rad Art                                      64                        Dist                                                                      +--------------+-------------+---------+---------+---------+-------------------+   Summary: The Left Brachial Cephalic AVF appears to patent throughout; Flow Volume appears to be normal. A Known Aneurysm still seen distal to the Anastomosis measuring 2.10 cms x 2.23 cms in the Distal Upper Arm area. Another Aneurysm seen in the Proximal Upper Arm prior to Proximal segment of the AVF measuring 2.88 cms.  *See table(s) above for measurements and observations.  Diagnosing physician: [name] MD Electronically signed by [name] MD on 09/17/2021 at 2:52:02 PM.   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  Although there is not any immediate skin threat, this is very painful to the patient and he desires to have it revised and excised.  That seems reasonable.  We discussed that he will need a PermCath in the interim while the wound is healing.  It will be probably 2 to 4 weeks before he can begin using the revised access.  Risks and benefits were discussed and he is agreeable to proceed.  Hypertension blood pressure control important in reducing the progression of atherosclerotic disease. On appropriate oral medications.   ESRD on hemodialysis ([place]) Using his left arm access which we are planning revising as above.    [name], MD  09/17/2021 3:24 PM  This note was created with Dragon medical transcription system.  Any errors from dictation are purely unintentional

## 2021-11-15 ENCOUNTER — Telehealth (INDEPENDENT_AMBULATORY_CARE_PROVIDER_SITE_OTHER): Payer: Self-pay

## 2021-11-15 NOTE — Telephone Encounter (Signed)
Spoke with the patient's neighbor with the patient and he is scheduled with [name] for a left arm AV fistula revision with Artegraft and permcath placement on 11/21/21 at the [facility]. Pre-op phone call is on 11/20/21 between 8-1 pm. Pre-surgical instructions will be mailed to the neighbor as instructed.

## 2021-11-20 ENCOUNTER — Other Ambulatory Visit (INDEPENDENT_AMBULATORY_CARE_PROVIDER_SITE_OTHER): Payer: Self-pay | Admitting: Nurse Practitioner

## 2021-11-20 ENCOUNTER — Other Ambulatory Visit: Payer: Self-pay

## 2021-11-20 ENCOUNTER — Other Ambulatory Visit
Admission: RE | Admit: 2021-11-20 | Discharge: 2021-11-20 | Disposition: A | Discharge status: Home / Self Care | Payer: Medicare HMO | Source: Ambulatory Visit | Attending: Vascular Surgery | Admitting: Vascular Surgery

## 2021-11-20 DIAGNOSIS — N186 End stage renal disease: Secondary | ICD-10-CM

## 2021-11-20 HISTORY — DX: Cerebral infarction, unspecified: I63.9

## 2021-11-20 HISTORY — DX: Pneumonia, unspecified organism: J18.9

## 2021-11-20 HISTORY — DX: Anemia, unspecified: D64.9

## 2021-11-20 NOTE — Patient Instructions (Addendum)
Your procedure is scheduled on: 11/21/21 - Thursday Report to the Registration Desk on the 1st floor of the [facility]. To find out your arrival time, please call [phone] between 1PM - 3PM on: 11/20/21   REMEMBER: Instructions that are not followed completely may result in serious medical risk, up to and including death; or upon the discretion of your surgeon and anesthesiologist your surgery may need to be rescheduled.  Do not eat food and drink any fluids after midnight the night before surgery.  No gum chewing, lozengers or hard candies.   TAKE THESE MEDICATIONS THE MORNING OF SURGERY WITH A SIP OF WATER:  - levETIRAcetam (KEPPRA XR) 500 MG 24 hr tablet - omeprazole (PRILOSEC) 20 MG capsule,(take one the night before and one on the morning of surgery - helps to prevent nausea after surgery.) - VIMPAT 100 MG TABS   Use albuterol (VENTOLIN HFA) 108 (90 Base) MCG/ACT inhaler on the day of surgery and bring to the hospital.  Follow recommendations from Cardiologist, Pulmonologist or PCP regarding stopping Aspirin, Coumadin, Plavix, Eliquis, Pradaxa, or Pletal.  One week prior to surgery: Stop Anti-inflammatories (NSAIDS) such as Advil, Aleve, Ibuprofen, Motrin, Naproxen, Naprosyn and Aspirin based products such as Excedrin, Goodys Powder, BC Powder.  Stop ANY OVER THE COUNTER supplements until after surgery.ascorbic acid (VITAMIN C) 500   You may however, continue to take Tylenol if needed for pain up until the day of surgery.  No Alcohol for 24 hours before or after surgery.  No Smoking including e-cigarettes for 24 hours prior to surgery.  No chewable tobacco products for at least 6 hours prior to surgery.  No nicotine patches on the day of surgery.  Do not use any "recreational" drugs for at least a week prior to your surgery.  Please be advised that the combination of cocaine and anesthesia may have negative outcomes, up to and including death. If you test positive  for cocaine, your surgery will be cancelled.  On the morning of surgery brush your teeth with toothpaste and water, you may rinse your mouth with mouthwash if you wish. Do not swallow any toothpaste or mouthwash.  Take a fresh shower/bath the morning of your procedure.  Do not wear jewelry, make-up, hairpins, clips or nail polish.  Do not wear lotions, powders, or perfumes.   Do not shave body from the neck down 48 hours prior to surgery just in case you cut yourself which could leave a site for infection.  Also, freshly shaved skin may become irritated if using the CHG soap.  Contact lenses, hearing aids and dentures may not be worn into surgery.  Do not bring valuables to the hospital. [facility] is not responsible for any missing/lost belongings or valuables.   Notify your doctor if there is any change in your medical condition (cold, fever, infection).  Wear comfortable clothing (specific to your surgery type) to the hospital.  After surgery, you can help prevent lung complications by doing breathing exercises.  Take deep breaths and cough every 1-2 hours. Your doctor may order a device called an Incentive Spirometer to help you take deep breaths. When coughing or sneezing, hold a pillow firmly against your incision with both hands. This is called splinting. Doing this helps protect your incision. It also decreases belly discomfort.  If you are being admitted to the hospital overnight, leave your suitcase in the car. After surgery it may be brought to your room.  If you are being discharged the day  of surgery, you will not be allowed to drive home. You will need a responsible adult (18 years or older) to drive you home and stay with you that night.   If you are taking public transportation, you will need to have a responsible adult (18 years or older) with you. Please confirm with your physician that it is acceptable to use public transportation.   Please call the  [facility] at [phone] if you have any questions about these instructions.  Surgery Visitation Policy:  Patients undergoing a surgery or procedure may have one family member or support person with them as long as that person is not COVID-19 positive or experiencing its symptoms.  That person may remain in the waiting area during the procedure and may rotate out with other people.  Inpatient Visitation:    Visiting hours are 7 a.m. to 8 p.m. Up to two visitors ages 16+ are allowed at one time in a patient room. The visitors may rotate out with other people during the day. Visitors must check out when they leave, or other visitors will not be allowed. One designated support person may remain overnight. The visitor must pass COVID-19 screenings, use hand sanitizer when entering and exiting the patients room and wear a mask at all times, including in the patients room. Patients must also wear a mask when staff or their visitor are in the room. Masking is required regardless of vaccination status.

## 2021-11-21 ENCOUNTER — Other Ambulatory Visit: Payer: Self-pay

## 2021-11-21 ENCOUNTER — Encounter: Payer: Self-pay | Admitting: Vascular Surgery

## 2021-11-21 ENCOUNTER — Ambulatory Visit: Payer: Medicare HMO

## 2021-11-21 ENCOUNTER — Ambulatory Visit: Payer: Medicare HMO | Admitting: Anesthesiology

## 2021-11-21 ENCOUNTER — Encounter
Admission: RE | Disposition: A | Discharge status: Home / Self Care | Payer: Self-pay | Source: Home / Self Care | Attending: Vascular Surgery

## 2021-11-21 ENCOUNTER — Ambulatory Visit
Admission: RE | Admit: 2021-11-21 | Discharge: 2021-11-21 | Disposition: A | Discharge status: Home / Self Care | Payer: Medicare HMO | Attending: Vascular Surgery | Admitting: Vascular Surgery

## 2021-11-21 DIAGNOSIS — M79602 Pain in left arm: Secondary | ICD-10-CM | POA: Diagnosis not present

## 2021-11-21 DIAGNOSIS — Z992 Dependence on renal dialysis: Secondary | ICD-10-CM | POA: Diagnosis not present

## 2021-11-21 DIAGNOSIS — I120 Hypertensive chronic kidney disease with stage 5 chronic kidney disease or end stage renal disease: Secondary | ICD-10-CM | POA: Insufficient documentation

## 2021-11-21 DIAGNOSIS — Y841 Kidney dialysis as the cause of abnormal reaction of the patient, or of later complication, without mention of misadventure at the time of the procedure: Secondary | ICD-10-CM | POA: Insufficient documentation

## 2021-11-21 DIAGNOSIS — T82898A Other specified complication of vascular prosthetic devices, implants and grafts, initial encounter: Secondary | ICD-10-CM | POA: Insufficient documentation

## 2021-11-21 DIAGNOSIS — N185 Chronic kidney disease, stage 5: Secondary | ICD-10-CM | POA: Diagnosis not present

## 2021-11-21 DIAGNOSIS — N186 End stage renal disease: Secondary | ICD-10-CM | POA: Diagnosis not present

## 2021-11-21 DIAGNOSIS — Z419 Encounter for procedure for purposes other than remedying health state, unspecified: Secondary | ICD-10-CM

## 2021-11-21 HISTORY — PX: INSERTION OF DIALYSIS CATHETER: SHX1324

## 2021-11-21 HISTORY — PX: REVISON OF ARTERIOVENOUS FISTULA: SHX6074

## 2021-11-21 LAB — POCT I-STAT, CHEM 8
BUN: 20 mg/dL (ref 8–23)
Calcium, Ion: 0.85 mmol/L — CL (ref 1.15–1.40)
Chloride: 95 mmol/L — ABNORMAL LOW (ref 98–111)
Creatinine, Ser: 7 mg/dL — ABNORMAL HIGH (ref 0.61–1.24)
Glucose, Bld: 107 mg/dL — ABNORMAL HIGH (ref 70–99)
HCT: 40 % (ref 39.0–52.0)
Hemoglobin: 13.6 g/dL (ref 13.0–17.0)
Potassium: 4.2 mmol/L (ref 3.5–5.1)
Sodium: 136 mmol/L (ref 135–145)
TCO2: 31 mmol/L (ref 22–32)

## 2021-11-21 LAB — CBC WITH DIFFERENTIAL/PLATELET
Abs Immature Granulocytes: 0.02 10*3/uL (ref 0.00–0.07)
Basophils Absolute: 0.1 10*3/uL (ref 0.0–0.1)
Basophils Relative: 1 %
Eosinophils Absolute: 0.1 10*3/uL (ref 0.0–0.5)
Eosinophils Relative: 2 %
HCT: 39.7 % (ref 39.0–52.0)
Hemoglobin: 13.3 g/dL (ref 13.0–17.0)
Immature Granulocytes: 0 %
Lymphocytes Relative: 17 %
Lymphs Abs: 1.1 10*3/uL (ref 0.7–4.0)
MCH: 32.9 pg (ref 26.0–34.0)
MCHC: 33.5 g/dL (ref 30.0–36.0)
MCV: 98.3 fL (ref 80.0–100.0)
Monocytes Absolute: 0.9 10*3/uL (ref 0.1–1.0)
Monocytes Relative: 13 %
Neutro Abs: 4.3 10*3/uL (ref 1.7–7.7)
Neutrophils Relative %: 67 %
Platelets: 237 10*3/uL (ref 150–400)
RBC: 4.04 MIL/uL — ABNORMAL LOW (ref 4.22–5.81)
RDW: 13.7 % (ref 11.5–15.5)
WBC: 6.5 10*3/uL (ref 4.0–10.5)
nRBC: 0 % (ref 0.0–0.2)

## 2021-11-21 LAB — BASIC METABOLIC PANEL
Anion gap: 14 (ref 5–15)
BUN: 20 mg/dL (ref 8–23)
CO2: 32 mmol/L (ref 22–32)
Calcium: 9.4 mg/dL (ref 8.9–10.3)
Chloride: 92 mmol/L — ABNORMAL LOW (ref 98–111)
Creatinine, Ser: 6.43 mg/dL — ABNORMAL HIGH (ref 0.61–1.24)
GFR, Estimated: 9 mL/min — ABNORMAL LOW (ref >60)
Glucose, Bld: 110 mg/dL — ABNORMAL HIGH (ref 70–99)
Potassium: 4.3 mmol/L (ref 3.5–5.1)
Sodium: 138 mmol/L (ref 135–145)

## 2021-11-21 LAB — TYPE AND SCREEN
ABO/RH(D): O NEG
Antibody Screen: NEGATIVE

## 2021-11-21 SURGERY — REVISON OF ARTERIOVENOUS FISTULA
Anesthesia: Regional

## 2021-11-21 MED ORDER — MEPERIDINE HCL 25 MG/ML IJ SOLN: 6.2500 mg | INTRAMUSCULAR | Status: DC | PRN

## 2021-11-21 MED ORDER — HEPARIN SODIUM (PORCINE) 5000 UNIT/ML IJ SOLN
INTRAMUSCULAR | Status: AC
  Filled 2021-11-21: qty 2

## 2021-11-21 MED ORDER — ONDANSETRON HCL 4 MG/2ML IJ SOLN: 4.0000 mg | Freq: Four times a day (QID) | INTRAMUSCULAR | Status: DC | PRN

## 2021-11-21 MED ORDER — CEFAZOLIN SODIUM-DEXTROSE 2-4 GM/100ML-% IV SOLN
INTRAVENOUS | Status: AC
  Filled 2021-11-21: qty 100

## 2021-11-21 MED ORDER — BUPIVACAINE HCL (PF) 0.25 % IJ SOLN
INTRAMUSCULAR | Status: DC | PRN
  Administered 2021-11-21: 20 mL via PERINEURAL

## 2021-11-21 MED ORDER — GLYCOPYRROLATE 0.2 MG/ML IJ SOLN
INTRAMUSCULAR | Status: AC
  Filled 2021-11-21: qty 1

## 2021-11-21 MED ORDER — LIDOCAINE HCL (PF) 2 % IJ SOLN
INTRAMUSCULAR | Status: AC
  Filled 2021-11-21: qty 5

## 2021-11-21 MED ORDER — PHENYLEPHRINE 40 MCG/ML (10ML) SYRINGE FOR IV PUSH (FOR BLOOD PRESSURE SUPPORT)
PREFILLED_SYRINGE | INTRAVENOUS | Status: DC | PRN
  Administered 2021-11-21 (×3): 80 ug via INTRAVENOUS

## 2021-11-21 MED ORDER — PHENYLEPHRINE 40 MCG/ML (10ML) SYRINGE FOR IV PUSH (FOR BLOOD PRESSURE SUPPORT): PREFILLED_SYRINGE | INTRAVENOUS | Status: DC | PRN

## 2021-11-21 MED ORDER — HYDROCODONE-ACETAMINOPHEN 5-325 MG PO TABS: 2.0000 | ORAL_TABLET | Freq: Four times a day (QID) | ORAL | 0 refills | Status: AC | PRN

## 2021-11-21 MED ORDER — PROPOFOL 500 MG/50ML IV EMUL
INTRAVENOUS | Status: AC
  Filled 2021-11-21: qty 50

## 2021-11-21 MED ORDER — HEPARIN SODIUM (PORCINE) 1000 UNIT/ML IJ SOLN
INTRAMUSCULAR | Status: AC
  Filled 2021-11-21: qty 10

## 2021-11-21 MED ORDER — GLYCOPYRROLATE 0.2 MG/ML IJ SOLN
INTRAMUSCULAR | Status: DC | PRN
  Administered 2021-11-21: .2 mg via INTRAVENOUS

## 2021-11-21 MED ORDER — LIDOCAINE HCL (PF) 2 % IJ SOLN
INTRAMUSCULAR | Status: AC
  Filled 2021-11-21: qty 10

## 2021-11-21 MED ORDER — PROPOFOL 500 MG/50ML IV EMUL
INTRAVENOUS | Status: DC | PRN
  Administered 2021-11-21: 100 ug/kg/min via INTRAVENOUS

## 2021-11-21 MED ORDER — SODIUM CHLORIDE FLUSH 0.9 % IV SOLN
INTRAVENOUS | Status: AC
  Filled 2021-11-21: qty 20

## 2021-11-21 MED ORDER — PHENYLEPHRINE HCL-NACL 20-0.9 MG/250ML-% IV SOLN
INTRAVENOUS | Status: AC
  Filled 2021-11-21: qty 250

## 2021-11-21 MED ORDER — CHLORHEXIDINE GLUCONATE 0.12 % MT SOLN: 15.0000 mL | Freq: Once | OROMUCOSAL | Status: DC

## 2021-11-21 MED ORDER — LORAZEPAM 2 MG/ML IJ SOLN: 1.0000 mg | Freq: Once | INTRAMUSCULAR | Status: DC | PRN

## 2021-11-21 MED ORDER — BUPIVACAINE-EPINEPHRINE (PF) 0.5% -1:200000 IJ SOLN
INTRAMUSCULAR | Status: AC
  Filled 2021-11-21: qty 30

## 2021-11-21 MED ORDER — PROPOFOL 500 MG/50ML IV EMUL
INTRAVENOUS | Status: DC | PRN
  Administered 2021-11-21 (×2): 30 mg via INTRAVENOUS

## 2021-11-21 MED ORDER — CEFAZOLIN SODIUM-DEXTROSE 2-4 GM/100ML-% IV SOLN
2.0000 g | INTRAVENOUS | Status: AC
  Administered 2021-11-21: 2 g via INTRAVENOUS

## 2021-11-21 MED ORDER — PHENYLEPHRINE HCL-NACL 20-0.9 MG/250ML-% IV SOLN
INTRAVENOUS | Status: DC | PRN
  Administered 2021-11-21: 10 ug/min via INTRAVENOUS

## 2021-11-21 MED ORDER — CHLORHEXIDINE GLUCONATE CLOTH 2 % EX PADS: 6.0000 | MEDICATED_PAD | Freq: Once | CUTANEOUS | Status: DC

## 2021-11-21 MED ORDER — HEPARIN SODIUM (PORCINE) 1000 UNIT/ML IJ SOLN
INTRAMUSCULAR | Status: DC | PRN
  Administered 2021-11-21: 3000 [IU] via INTRAVENOUS

## 2021-11-21 MED ORDER — MIDAZOLAM HCL 2 MG/2ML IJ SOLN: 1.0000 mg | Freq: Once | INTRAMUSCULAR | Status: AC

## 2021-11-21 MED ORDER — HEPARIN 5000 UNITS IN NS 1000 ML (FLUSH)
INTRAMUSCULAR | Status: DC | PRN
  Administered 2021-11-21: 120 mL via INTRAMUSCULAR

## 2021-11-21 MED ORDER — HYDROMORPHONE HCL 1 MG/ML IJ SOLN: 1.0000 mg | Freq: Once | INTRAMUSCULAR | Status: DC | PRN

## 2021-11-21 MED ORDER — SODIUM CHLORIDE 0.9 % IV SOLN: INTRAVENOUS | Status: DC

## 2021-11-21 MED ORDER — MIDAZOLAM HCL 2 MG/2ML IJ SOLN: 2.0000 mg | INTRAMUSCULAR | Status: DC | PRN

## 2021-11-21 MED ORDER — FENTANYL CITRATE (PF) 100 MCG/2ML IJ SOLN: 25.0000 ug | INTRAMUSCULAR | Status: DC | PRN

## 2021-11-21 MED ORDER — EPINEPHRINE PF 1 MG/ML IJ SOLN
INTRAMUSCULAR | Status: AC
  Filled 2021-11-21: qty 1

## 2021-11-21 MED ORDER — LIDOCAINE HCL (PF) 2 % IJ SOLN
INTRAMUSCULAR | Status: DC | PRN
  Administered 2021-11-21: 200 mg via PERINEURAL

## 2021-11-21 MED ORDER — ORAL CARE MOUTH RINSE: 15.0000 mL | Freq: Once | OROMUCOSAL | Status: DC

## 2021-11-21 MED ORDER — MIDAZOLAM HCL 2 MG/2ML IJ SOLN
INTRAMUSCULAR | Status: AC
  Administered 2021-11-21: 1 mg via INTRAVENOUS
  Filled 2021-11-21: qty 2

## 2021-11-21 MED ORDER — ONDANSETRON HCL 4 MG/2ML IJ SOLN: 4.0000 mg | Freq: Once | INTRAMUSCULAR | Status: DC | PRN

## 2021-11-21 SURGICAL SUPPLY — 74 items
ADH SKN CLS APL DERMABOND .7 (GAUZE/BANDAGES/DRESSINGS) ×2
APL PRP STRL LF DISP 70% ISPRP (MISCELLANEOUS) ×4
BAG DECANTER FOR FLEXI CONT (MISCELLANEOUS) ×4 IMPLANT
BLADE SURG 15 STRL LF DISP TIS (BLADE) ×2 IMPLANT
BLADE SURG 15 STRL SS (BLADE) ×3
BLADE SURG SZ11 CARB STEEL (BLADE) ×3 IMPLANT
BOOT SUTURE AID YELLOW STND (SUTURE) ×3 IMPLANT
BRUSH SCRUB EZ  4% CHG (MISCELLANEOUS) ×2
BRUSH SCRUB EZ 4% CHG (MISCELLANEOUS) ×2 IMPLANT
CATH CANNON HEMO 15FR 19 (HEMODIALYSIS SUPPLIES) ×1 IMPLANT
CHLORAPREP W/TINT 26 (MISCELLANEOUS) ×4 IMPLANT
CLIP SPRNG 6 S-JAW DBL (CLIP) ×2 IMPLANT
CLIP SPRNG 6MM S-JAW DBL (CLIP) ×3
COVER PROBE FLX POLY STRL (MISCELLANEOUS) ×4 IMPLANT
DECANTER SPIKE VIAL GLASS SM (MISCELLANEOUS) ×2 IMPLANT
DERMABOND ADVANCED (GAUZE/BANDAGES/DRESSINGS) ×1
DERMABOND ADVANCED .7 DNX12 (GAUZE/BANDAGES/DRESSINGS) ×2 IMPLANT
DRAPE C-ARM XRAY 36X54 (DRAPES) ×3 IMPLANT
DRAPE LAPAROTOMY 77X122 PED (DRAPES) ×3 IMPLANT
ELECT CAUTERY BLADE 6.4 (BLADE) ×3 IMPLANT
ELECT REM PT RETURN 9FT ADLT (ELECTROSURGICAL) ×3
ELECTRODE REM PT RTRN 9FT ADLT (ELECTROSURGICAL) ×2 IMPLANT
GAUZE 4X4 16PLY ~~LOC~~+RFID DBL (SPONGE) ×3 IMPLANT
GLOVE SURG SYN 7.0 (GLOVE) ×3 IMPLANT
GLOVE SURG SYN 7.0 PF PI (GLOVE) ×2 IMPLANT
GOWN STRL REUS W/ TWL LRG LVL3 (GOWN DISPOSABLE) ×2 IMPLANT
GOWN STRL REUS W/ TWL XL LVL3 (GOWN DISPOSABLE) ×2 IMPLANT
GOWN STRL REUS W/TWL LRG LVL3 (GOWN DISPOSABLE) ×3
GOWN STRL REUS W/TWL XL LVL3 (GOWN DISPOSABLE) ×3
GRAFT COLLAGEN VASCULAR 8X45 (Miscellaneous) ×1 IMPLANT
HEMOSTAT SURGICEL 2X3 (HEMOSTASIS) ×3 IMPLANT
IV NS 500ML (IV SOLUTION) ×3
IV NS 500ML BAXH (IV SOLUTION) ×2 IMPLANT
KIT TURNOVER KIT A (KITS) ×3 IMPLANT
LABEL OR SOLS (LABEL) ×3 IMPLANT
LOOP RED MAXI  1X406MM (MISCELLANEOUS) ×1
LOOP VESSEL MAXI 1X406 RED (MISCELLANEOUS) ×2 IMPLANT
LOOP VESSEL MINI 0.8X406 BLUE (MISCELLANEOUS) ×2 IMPLANT
LOOPS BLUE MINI 0.8X406MM (MISCELLANEOUS) ×1
MANIFOLD NEPTUNE II (INSTRUMENTS) ×3 IMPLANT
NDL FILTER BLUNT 18X1 1/2 (NEEDLE) ×2 IMPLANT
NDL HYPO 25X1 1.5 SAFETY (NEEDLE) IMPLANT
NEEDLE FILTER BLUNT 18X 1/2SAF (NEEDLE) ×1
NEEDLE FILTER BLUNT 18X1 1/2 (NEEDLE) ×2 IMPLANT
NEEDLE HYPO 25X1 1.5 SAFETY (NEEDLE) IMPLANT
NS IRRIG 500ML POUR BTL (IV SOLUTION) ×3 IMPLANT
PACK ANGIOGRAPHY (CUSTOM PROCEDURE TRAY) ×1 IMPLANT
PACK BASIN MINOR ARMC (MISCELLANEOUS) ×2 IMPLANT
PACK EXTREMITY ARMC (MISCELLANEOUS) ×3 IMPLANT
PAD PREP 24X41 OB/GYN DISP (PERSONAL CARE ITEMS) ×3 IMPLANT
SOLUTION CELL SAVER (CLIP) ×2 IMPLANT
STOCKINETTE 48X4 2 PLY STRL (GAUZE/BANDAGES/DRESSINGS) ×2 IMPLANT
STOCKINETTE STRL 4IN 9604848 (GAUZE/BANDAGES/DRESSINGS) ×3 IMPLANT
SUT GORETEX CV-6TTC-13 36IN (SUTURE) IMPLANT
SUT MNCRL AB 4-0 PS2 18 (SUTURE) ×3 IMPLANT
SUT MNCRL+ 5-0 UNDYED PC-3 (SUTURE) ×2 IMPLANT
SUT MONOCRYL 5-0 (SUTURE) ×1
SUT PROLENE 5 0 RB 1 DA (SUTURE) ×1 IMPLANT
SUT PROLENE 6 0 BV (SUTURE) ×12 IMPLANT
SUT SILK 2 0 (SUTURE) ×3
SUT SILK 2 0 SH (SUTURE) ×4 IMPLANT
SUT SILK 2-0 18XBRD TIE 12 (SUTURE) ×2 IMPLANT
SUT SILK 3 0 (SUTURE) ×3
SUT SILK 3-0 18XBRD TIE 12 (SUTURE) ×2 IMPLANT
SUT SILK 4 0 (SUTURE) ×3
SUT SILK 4-0 18XBRD TIE 12 (SUTURE) ×2 IMPLANT
SUT VIC AB 3-0 SH 27 (SUTURE)
SUT VIC AB 3-0 SH 27X BRD (SUTURE) ×4 IMPLANT
SYR 10ML LL (SYRINGE) ×3 IMPLANT
SYR 20ML LL LF (SYRINGE) ×3 IMPLANT
SYR 3ML LL SCALE MARK (SYRINGE) ×3 IMPLANT
SYR TOOMEY IRRIG 70ML (MISCELLANEOUS) ×3
SYRINGE TOOMEY IRRIG 70ML (MISCELLANEOUS) IMPLANT
WATER STERILE IRR 500ML POUR (IV SOLUTION) ×2 IMPLANT

## 2021-11-21 NOTE — Anesthesia Procedure Notes (Signed)
Anesthesia Regional Block: Supraclavicular block   Pre-Anesthetic Checklist: , timeout performed,  Correct Patient, Correct Site, Correct Laterality,  Correct Procedure, Correct Position, site marked,  Risks and benefits discussed,  Surgical consent,  Pre-op evaluation,  At surgeon's request and post-op pain management  Laterality: Left  Prep: chloraprep       Needles:  Injection technique: Single-shot  Needle Type: Stimiplex     Needle Length: 9cm  Needle Gauge: 22     Additional Needles:   Procedures:,,,, ultrasound used (permanent image in chart),,    Narrative:  Start time: 11/21/2021 11:52 AM End time: 11/21/2021 11:54 AM Injection made incrementally with aspirations every 5 mL.  Performed by: Personally  Anesthesiologist: Kings Grant Blas, MD  Additional Notes: Patient consented for risk and benefits of nerve block including but not limited to nerve damage, failed block, bleeding and infection.  Patient voiced understanding.  Functioning IV was confirmed and monitors were applied.  Timeout done prior to procedure and prior to any sedation being given to the patient.  Patient confirmed procedure site prior to any sedation given to the patient.  A 23mm 22ga Stimuplex needle was used. Sterile prep,hand hygiene and sterile gloves were used.  Minimal sedation used for procedure.  No paresthesia endorsed by patient during the procedure.  Negative aspiration and negative test dose prior to incremental administration of local anesthetic. The patient tolerated the procedure well with no immediate complications.  DRUGS 10 ML 2% PLUS 20 ML 0.25% BUPIVACAINE,  ASP Q 5 ML NEGATIVE, PHOTO IN CHART

## 2021-11-21 NOTE — Anesthesia Postprocedure Evaluation (Signed)
Anesthesia Post Note  Patient: Jeremy Rose  Procedure(s) Performed: REVISON OF ARTERIOVENOUS FISTULA (ARTEGRAFT) (Left) INSERTION OF DIALYSIS CATHETER (PERMCATH)  Anesthesia Type: Regional Anesthetic complications: no   No notable events documented.   Last Vitals:  Vitals:   11/21/21 1510 11/21/21 1525  BP: (!) 84/65 95/67  Pulse: 87 90  Resp: (!) 22 20  Temp:  36.5 C  SpO2: 98% 99%    Last Pain:  Vitals:   11/21/21 1525  TempSrc: Temporal  PainSc: 0-No pain                 [place] Blas

## 2021-11-21 NOTE — H&P (Signed)
[facility] Admission History & Physical  MRN : [id number]  Jeremy Rose is a 66 y.o. (24-Jul-1956) male who presents with chief complaint of No chief complaint on file. 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  He is still been using for dialysis.  We are also planning to place a PermCath today.  No fevers or chills.  Does have left arm pain from the aneurysmal fistula but no other complaints.  Current Facility-Administered Medications  Medication Dose Route Frequency Provider Last Rate Last Admin   0.9 %  sodium chloride infusion   Intravenous Continuous Molli Barrows, MD       ceFAZolin (ANCEF) IVPB 2g/100 mL premix  2 g Intravenous On Call to OR Kris Hartmann, NP       chlorhexidine (PERIDEX) 0.12 % solution 15 mL  15 mL Mouth/Throat Once Molli Barrows, MD       Or   MEDLINE mouth rinse  15 mL Mouth Rinse Once Molli Barrows, MD       Chlorhexidine Gluconate Cloth 2 % PADS 6 each  6 each Topical Once Kris Hartmann, NP       And   Chlorhexidine Gluconate Cloth 2 % PADS 6 each  6 each Topical Once Kris Hartmann, NP       HYDROmorphone (DILAUDID) injection 1 mg  1 mg Intravenous Once PRN Kris Hartmann, NP       midazolam (VERSED) injection 2 mg  2 mg Intravenous PRN [place] Blas, MD       ondansetron [facility]) injection 4 mg  4 mg Intravenous Q6H PRN Kris Hartmann, NP        Past Medical History:  Diagnosis Date   Anemia    Arthritis    hands   Asthma    uses inhaler   Coronary artery disease    Dialysis patient [facility]) 2012   Monday, Wednesday, Friday   GERD (gastroesophageal reflux disease)    Gout of right hand    Hypertension    Pneumonia    Renal disorder    stage V..on dialysis   Seizures ([place]) 2019   last seizure 5 years ago. had 20 in one day   Stroke [facility])     Past Surgical History:  Procedure Laterality Date   A/V  FISTULAGRAM Left 02/24/2017   Procedure: A/V Fistulagram;  Surgeon: Katha Cabal, MD;  Location: [place] [facility];  Service: Cardiovascular;  Laterality: Left;   A/V FISTULAGRAM Left 07/20/2019   Procedure: A/V FISTULAGRAM;  Surgeon: Algernon Huxley, MD;  Location: [place] [facility];  Service: Cardiovascular;  Laterality: Left;   A/V FISTULAGRAM Left 04/03/2020   Procedure: A/V FISTULAGRAM;  Surgeon: Algernon Huxley, MD;  Location: [place] [facility];  Service: Cardiovascular;  Laterality: Left;   A/V FISTULAGRAM Left 09/06/2020   Procedure: A/V FISTULAGRAM;  Surgeon: Algernon Huxley, MD;  Location: [place] [facility];  Service: Cardiovascular;  Laterality: Left;   A/V FISTULAGRAM Left 01/17/2021   Procedure: A/V FISTULAGRAM;  Surgeon: Algernon Huxley, MD;  Location: [place] [facility];  Service: Cardiovascular;  Laterality: Left;   A/V FISTULAGRAM Left 08/08/2021   Procedure: A/V FISTULAGRAM;  Surgeon: Algernon Huxley, MD;  Location: [place] [facility];  Service: Cardiovascular;  Laterality: Left;   A/V SHUNT INTERVENTION N/A 02/24/2017   Procedure: A/V Shunt  Intervention;  Surgeon: Katha Cabal, MD;  Location: [place] [facility];  Service: Cardiovascular;  Laterality: N/A;   A/V SHUNT INTERVENTION N/A 11/14/2019   Procedure: A/V SHUNT INTERVENTION;  Surgeon: Algernon Huxley, MD;  Location: [place] [facility];  Service: Cardiovascular;  Laterality: N/A;   AV FISTULA PLACEMENT Left    AV FISTULA PLACEMENT Left 12/02/2017   Procedure: ARTERIOVENOUS (AV) FISTULA CREATION ( BRACHIAL CEPHALIC );  Surgeon: Katha Cabal, MD;  Location: [facility];  Service: Vascular;  Laterality: Left;   CARDIAC CATHETERIZATION  2012   no stents   COLONOSCOPY Left 09/05/2018   Procedure: COLONOSCOPY;  Surgeon: Virgel Manifold, MD;  Location: [facility];  Service: Endoscopy;  Laterality: Left;   DIALYSIS/PERMA CATHETER REMOVAL N/A 08/24/2018   Procedure: DIALYSIS/PERMA CATHETER REMOVAL;  Surgeon:  Katha Cabal, MD;  Location: [place] [facility];  Service: Cardiovascular;  Laterality: N/A;   ESOPHAGOGASTRODUODENOSCOPY Left 09/05/2018   Procedure: ESOPHAGOGASTRODUODENOSCOPY (EGD);  Surgeon: Virgel Manifold, MD;  Location: [facility] ENDOSCOPY;  Service: Endoscopy;  Laterality: Left;   ESOPHAGOGASTRODUODENOSCOPY (EGD) WITH PROPOFOL N/A 08/31/2018   Procedure: ESOPHAGOGASTRODUODENOSCOPY (EGD) WITH PROPOFOL;  Surgeon: Lucilla Lame, MD;  Location: [facility] ENDOSCOPY;  Service: Endoscopy;  Laterality: N/A;   GIVENS CAPSULE STUDY N/A 09/07/2018   Procedure: GIVENS CAPSULE STUDY;  Surgeon: Jonathon Bellows, MD;  Location: [facility] ENDOSCOPY;  Service: Gastroenterology;  Laterality: N/A;   INSERTION OF DIALYSIS CATHETER Left 05/28/2018   Procedure: INSERTION OF DIALYSIS CATHETER ( TUNNELED CATH INSERT );  Surgeon: Katha Cabal, MD;  Location: [facility] OR[facility];  Service: Vascular;  Laterality: Left;   LIGATION OF ARTERIOVENOUS  FISTULA Left 12/02/2017   Procedure: LIGATION OF ARTERIOVENOUS  FISTULA ( REMOVAL LEFT WRIST FISTULA );  Surgeon: Katha Cabal, MD;  Location: [facility] OR[facility];  Service: Vascular;  Laterality: Left;   PERIPHERAL VASCULAR CATHETERIZATION Left 06/03/2016   Procedure: A/V Shuntogram/Fistulagram;  Surgeon: Katha Cabal, MD;  Location: [place] [facility];  Service: Cardiovascular;  Laterality: Left;   PERIPHERAL VASCULAR CATHETERIZATION  06/03/2016   Procedure: Peripheral Vascular Balloon Angioplasty;  Surgeon: Katha Cabal, MD;  Location: [place] [facility];  Service: Cardiovascular;;   PERIPHERAL VASCULAR THROMBECTOMY Left 11/14/2019   Procedure: PERIPHERAL VASCULAR THROMBECTOMY;  Surgeon: Algernon Huxley, MD;  Location: [place] [facility];  Service: Cardiovascular;  Laterality: Left;   REVISON OF ARTERIOVENOUS FISTULA Left 05/28/2018   Procedure: REVISON OF ARTERIOVENOUS FISTULA ( REMOVE ANEURYSM );  Surgeon: Katha Cabal, MD;  Location: [facility] OR[facility];  Service: Vascular;   Laterality: Left;   UPPER EXTREMITY VENOGRAPHY N/A 04/13/2018   Procedure: UPPER EXTREMITY VENOGRAPHY;  Surgeon: Katha Cabal, MD;  Location: [place] [facility];  Service: Cardiovascular;  Laterality: N/A;     Social History   Tobacco Use   Smoking status: Former    Types: Cigarettes   Smokeless tobacco: Former    Quit date: 2010   Tobacco comments:    did not inhale!!  Scientific laboratory technician Use: Never used  Substance Use Topics   Alcohol use: No   Drug use: No     Family History  Problem Relation Age of Onset   Heart disease Mother    Kidney disease Paternal Uncle   No bleeding or clotting disorders  Allergies  Allergen Reactions   Dextromethorphan-Guaifenesin Nausea And Vomiting   Codeine Nausea And Vomiting   Doxycycline Nausea And Vomiting   REVIEW OF SYSTEMS (Negative unless checked)  Constitutional: [] Weight loss  [] Fever  [] Chills Cardiac: [] Chest pain   [] Chest pressure   [] Palpitations   [] Shortness of breath when laying flat   [] Shortness of breath at rest   [] Shortness of breath with exertion. Vascular:  [] Pain in legs with walking   [] Pain in legs at rest   [] Pain in legs when laying flat   [] Claudication   [] Pain in feet when walking  [] Pain in feet at rest  [] Pain in feet when laying flat   [] History of DVT   [] Phlebitis   [] Swelling in legs   [] Varicose veins   [] Non-healing ulcers Pulmonary:   [] Uses home oxygen   [] Productive cough   [] Hemoptysis   [] Wheeze  [] COPD   [x] Asthma Neurologic:  [] Dizziness  [] Blackouts   [x] Seizures   [] History of stroke   [] History of TIA  [] Aphasia   [] Temporary blindness   [] Dysphagia   [] Weakness or numbness in arms   [] Weakness or numbness in legs Musculoskeletal:  [x] Arthritis   [] Joint swelling   [x] Joint pain   [] Low back pain Hematologic:  [] Easy bruising  [] Easy bleeding   [] Hypercoagulable state   [] Anemic   Gastrointestinal:  [] Blood in stool   [] Vomiting blood  [x] Gastroesophageal reflux/heartburn    [] Abdominal pain Genitourinary:  [x] Chronic kidney disease   [] Difficult urination  [] Frequent urination  [] Burning with urination   [] Hematuria Skin:  [] Rashes   [] Ulcers   [] Wounds Psychological:  [] History of anxiety   []  History of major depression.   Physical Examination  There were no vitals filed for this visit. There is no height or weight on file to calculate BMI. Gen: WD/WN, NAD Head: [place]/AT, No temporalis wasting.  Ear/Nose/Throat: Hearing grossly intact, nares w/o erythema or drainage, oropharynx w/o Erythema/Exudate,  Eyes: Conjunctiva clear, sclera non-icteric Neck: Trachea midline.  No JVD.  Pulmonary:  Good air movement, respirations not labored, no use of accessory muscles.  Cardiac: RRR, normal S1, S2. Vascular: large aneurysmal left arm AVF Vessel Right Left  Radial Palpable Palpable               Musculoskeletal: M/S 5/5 throughout.  Extremities without ischemic changes.  No deformity or atrophy.  Neurologic: Sensation grossly intact in extremities.  Symmetrical.  Speech is fluent. Motor exam as listed above. Psychiatric: Judgment intact, Mood & affect appropriate for pt's clinical situation. Dermatologic: No rashes or ulcers noted.  No cellulitis or open wounds.      CBC Lab Results  Component Value Date   WBC 9.1 09/29/2018   HGB 8.6 (L) 09/29/2018   HCT 26.6 (L) 09/29/2018   MCV 95.7 09/29/2018   PLT 229 09/29/2018    BMET    Component Value Date/Time   NA 139 09/29/2018 0404   NA 138 05/18/2014 2142   K 4.1 09/29/2018 0404   K 5.0 06/28/2014 1254   CL 96 (L) 09/29/2018 0404   CL 98 05/18/2014 2142   CO2 31 09/29/2018 0404   CO2 33 (H) 05/18/2014 2142   GLUCOSE 137 (H) 09/29/2018 0404   GLUCOSE 88 05/18/2014 2142   BUN 35 (H) 09/29/2018 0404   BUN 12 05/18/2014 2142   CREATININE 10.81 (H) 09/29/2018 0404   CREATININE 5.73 (H) 05/18/2014 2142   CALCIUM 8.5 (L) 09/29/2018 0404   CALCIUM 8.3 (L) 05/18/2014 2142   GFRNONAA 4 (L)  09/29/2018 0404   GFRNONAA 10 (L) 05/18/2014 2142   GFRAA 5 (L) 09/29/2018 0404   GFRAA 12 (L) 05/18/2014 2142   CrCl  cannot be calculated (Patient's most recent lab result is older than the maximum 21 days allowed.).  COAG Lab Results  Component Value Date   INR 0.94 05/18/2018   INR 0.94 11/24/2017    Radiology No results found.   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  Although there is not any immediate skin threat, this is very painful to the patient and he desires to have it revised and excised.  That seems reasonable.  We discussed that he will need a PermCath in the interim while the wound is healing.  It will be probably 2 to 4 weeks before he can begin using the revised access.  Risks and benefits were discussed and he is agreeable to proceed.   Hypertension blood pressure control important in reducing the progression of atherosclerotic disease. On appropriate oral medications.     ESRD on hemodialysis ([place]) Using his left arm access which we are planning revising as above.   [name], MD  11/21/2021 11:10 AM

## 2021-11-21 NOTE — Discharge Instructions (Signed)

## 2021-11-21 NOTE — Transfer of Care (Signed)
Immediate Anesthesia Transfer of Care Note  Patient: Jeremy Rose  Procedure(s) Performed: REVISON OF ARTERIOVENOUS FISTULA (ARTEGRAFT) (Left) INSERTION OF DIALYSIS CATHETER [facility])  Patient Location: PACU  Anesthesia Type:General  Level of Consciousness: awake, alert  and oriented  Airway & Oxygen Therapy: Patient Spontanous Breathing  Post-op Assessment: Report given to RN and Post -op Vital signs reviewed and stable  Post vital signs: Reviewed and stable  Last Vitals:  Vitals Value Taken Time  BP    Temp    Pulse    Resp    SpO2      Last Pain:  Vitals:   11/21/21 1127  TempSrc: Temporal  PainSc: 0-No pain         Complications: No notable events documented.

## 2021-11-21 NOTE — Progress Notes (Signed)
Patient reports his neighbor is giving him a ride home but he does not have anyone to stay with him.  Attempted to call sister susan per pt request but unable to reach after attempting X 3.  Called sister Thayer Headings and she reports she will go to his house when he gets home and pick him up and he can stay with her.  Paulette, neighbor is coming to pick pt up and will stay with patient until his sister comes.

## 2021-11-21 NOTE — Op Note (Signed)
[facility]   OPERATIVE NOTE   PROCEDURE: Jump graft revision  of aneurysmal left brachiocephalic arteriovenous fistula with 7 mm Artegraft Ligation of aneurysmal left brachiocephalic AV fistula  PRE-OPERATIVE DIAGNOSIS: 1. aneurysmal degeneration of arteriovenous fistula  2. ESRD  POST-OPERATIVE DIAGNOSIS: same as above   SURGEON: [name], MD  ASSISTANT(S): none  ANESTHESIA: General  ESTIMATED BLOOD LOSS: 25 cc  FINDING(S): Left brachiocephalic AVF aneurysm strong palpable thrill at end of the case  SPECIMEN(S):  none  INDICATIONS:   Jeremy Rose is a 66 y.o. male who  presents with aneurysmal degeneration of left arm arteriovenous access.  In order to salvage the fistula and decrease the bleeding complication risks, I recommended a jump graft revision with ligation of the access.  Risk, benefits, and alternatives to access surgery were discussed.  The patient is aware the risks include but are not limited to: bleeding, infection, steal syndrome, nerve damage, ischemic monomelic neuropathy, loss of the access, need for additional procedures, death and stroke.  The patient agrees to proceed forward with the procedure.  DESCRIPTION: After obtaining full informed written consent, the patient was brought back to the operating room and placed supine upon the operating table.  The patient received IV antibiotics prior to induction.  After obtaining adequate anesthesia, the patient was prepped and draped in the standard fashion for the access procedure.  As incision was created near the arterial anastomosis prior to the aneurysmal segment.  The access was encircled with vessel loops and prepared for control.  I then created an incision in the proximal arm beyond the aneurysmal segment and encircled the access there for control with a vessel loop.  I then used the tunneller and tunnelled between the two incisions around the old access.  I brought a 7 mm  Artegraft through the tunneller making sure to avoid twisting after marking for orientation.  The patient was then given 3000 units of intravenous heparin.  The access was then controlled and clamped and ligated distally with a silk suture ligature.  I prepared the end nearer the original arterial anastomosis for an anastomosis with the new Artegraft.  The anastomosis was created in an end to end fashion with two 6-0 Prolene sutures in the typical fashion.  I then flushed through the new graft and prepared this for the distal anastomosis.  The access was then divided and ligated again with a silk suture ligature.  The distal end was then prepared for anastomosis with the new graft.  An anastomosis was then created with two 6-0 Prolene sutures in the usual fashion.  The graft was flushed and de-aired prior to release of control.  Patch sutures with 6-0 Prolene sutures were used as needed for control of bleeding.  Surgicel and Evicel topical hemostatic agents were placed and hemostasis was complete.  I then closed the wound with 3-0 Vicryl suture in the subcutaneous space and a 4-0 Monocryl suture was used to close the skin.  Dermabond was placed as a dressing.  The patient was then awakened from anesthesia and taken to the recovery room in stable condition having tolerated the procedure well.    COMPLICATIONS: none  CONDITION: stable  [name]  11/21/2021, 2:33 PM   This note was created with Dragon Medical transcription system. Any errors in dictation are purely unintentional.

## 2021-11-21 NOTE — Anesthesia Preprocedure Evaluation (Addendum)
Anesthesia Evaluation    Airway Mallampati: II  TM Distance: >3 FB Neck ROM: Full    Dental   EDENT TOP:   Pulmonary shortness of breath, asthma , former smoker,    Pulmonary exam normal breath sounds clear to auscultation       Cardiovascular hypertension, + CAD  Normal cardiovascular exam Rhythm:Regular Rate:Normal  EKG TODAY  NSR   Neuro/Psych  Headaches, Seizures -,  CVA    GI/Hepatic GERD  ,  Endo/Other  GOUT  Renal/GU CRFRenal disease (LAST HD YESTERDAY)     Musculoskeletal  (+) Arthritis ,   Abdominal   Peds  Hematology  (+) anemia ,   Anesthesia Other Findings   Reproductive/Obstetrics                          Anesthesia Physical Anesthesia Plan  ASA: 3  Anesthesia Plan: Regional   Post-op Pain Management: Regional block   Induction:   PONV Risk Score and Plan:   Airway Management Planned:   Additional Equipment:   Intra-op Plan:   Post-operative Plan:   Informed Consent: I have reviewed the patients History and Physical, chart, labs and discussed the procedure including the risks, benefits and alternatives for the proposed anesthesia with the patient or authorized representative who has indicated his/her understanding and acceptance.       Plan Discussed with:   Anesthesia Plan Comments: (NERVE INJURY, PNEUMOTHORAX, BLEEDING, IV INJECTION)        Anesthesia Quick Evaluation

## 2021-11-21 NOTE — Anesthesia Procedure Notes (Signed)
Date/Time: 11/21/2021 12:30 PM Performed by: Lily Peer, Izzy Doubek, CRNA Pre-anesthesia Checklist: Patient identified, Suction available, Patient being monitored, Timeout performed and Emergency Drugs available Patient Re-evaluated:Patient Re-evaluated prior to induction Oxygen Delivery Method: Simple face mask Induction Type: IV induction

## 2021-11-22 ENCOUNTER — Encounter: Payer: Self-pay | Admitting: Vascular Surgery

## 2021-12-12 ENCOUNTER — Encounter (INDEPENDENT_AMBULATORY_CARE_PROVIDER_SITE_OTHER): Payer: Self-pay | Admitting: Nurse Practitioner

## 2021-12-12 ENCOUNTER — Other Ambulatory Visit: Payer: Self-pay

## 2021-12-12 ENCOUNTER — Ambulatory Visit (INDEPENDENT_AMBULATORY_CARE_PROVIDER_SITE_OTHER): Payer: Medicare HMO | Admitting: Nurse Practitioner

## 2021-12-12 VITALS — BP 109/71 | HR 102 | Resp 16 | Wt 147.4 lb

## 2021-12-12 DIAGNOSIS — Z992 Dependence on renal dialysis: Secondary | ICD-10-CM

## 2021-12-12 DIAGNOSIS — N186 End stage renal disease: Secondary | ICD-10-CM

## 2021-12-15 ENCOUNTER — Encounter (INDEPENDENT_AMBULATORY_CARE_PROVIDER_SITE_OTHER): Payer: Self-pay | Admitting: Nurse Practitioner

## 2021-12-15 NOTE — Progress Notes (Signed)
Subjective:    Patient ID: Jeremy Rose, male    DOB: [birth year], 66 y.o.   MRN: [id number] Chief Complaint  Patient presents with   Wound Check    3 week wound check    She returns to the after recent placement of a jump graft in his left upper extremity.  He notes that the upper extremity feels much better following the surgery.  There is still 2 incisions that are not yet fully healed.  Overall he is doing well post surgery.   Review of Systems  All other systems reviewed and are negative.     Objective:   Physical Exam Vitals reviewed.  HENT:     Head: Normocephalic.  Cardiovascular:     Arteriovenous access: Left arteriovenous access is present.    Comments:  Good thrill and bruit Pulmonary:     Effort: Pulmonary effort is normal.  Skin:    General: Skin is warm and dry.  Neurological:     Mental Status: He is alert and oriented to person, place, and time.  Psychiatric:        Mood and Affect: Mood normal.        Behavior: Behavior normal.        Thought Content: Thought content normal.        Judgment: Judgment normal.    BP 109/71 (BP Location: Right Arm)    Pulse (!) 102    Resp 16    Wt 147 lb 6.4 oz (66.9 kg)    BMI 22.41 kg/m   Past Medical History:  Diagnosis Date   Anemia    Arthritis    hands   Asthma    uses inhaler   Coronary artery disease    Dialysis patient [facility]) 2012   Monday, Wednesday, Friday   GERD (gastroesophageal reflux disease)    Gout of right hand    Hypertension    Pneumonia    Renal disorder    stage V..on dialysis   Seizures ([place]) 2019   last seizure 5 years ago. had 20 in one day   Stroke [facility])     Social History   Socioeconomic History   Marital status: Single    Spouse name: Not on file   Number of children: 0   Years of education: Not on file   Highest education level: Not on file  Occupational History   Occupation: disabled  Tobacco Use   Smoking status: Former    Types: Cigarettes   Smokeless  tobacco: Former    Quit date: 2010   Tobacco comments:    did not inhale!!  Scientific laboratory technician Use: Never used  Substance and Sexual Activity   Alcohol use: No   Drug use: No   Sexual activity: Not Currently  Other Topics Concern   Not on file  Social History Narrative   Lives by himself with close neighbors who help him    Social Determinants of Health   Financial Resource Strain: Not on file  Food Insecurity: Not on file  Transportation Needs: Not on file  Physical Activity: Not on file  Stress: Not on file  Social Connections: Not on file  Intimate Partner Violence: Not on file    Past Surgical History:  Procedure Laterality Date   A/V FISTULAGRAM Left 02/24/2017   Procedure: A/V Fistulagram;  Surgeon: Katha Cabal, MD;  Location: [place] [facility];  Service: Cardiovascular;  Laterality: Left;   A/V FISTULAGRAM Left 07/20/2019  Procedure: A/V FISTULAGRAM;  Surgeon: Algernon Huxley, MD;  Location: [place] [facility];  Service: Cardiovascular;  Laterality: Left;   A/V FISTULAGRAM Left 04/03/2020   Procedure: A/V FISTULAGRAM;  Surgeon: Algernon Huxley, MD;  Location: [place] [facility];  Service: Cardiovascular;  Laterality: Left;   A/V FISTULAGRAM Left 09/06/2020   Procedure: A/V FISTULAGRAM;  Surgeon: Algernon Huxley, MD;  Location: [place] [facility];  Service: Cardiovascular;  Laterality: Left;   A/V FISTULAGRAM Left 01/17/2021   Procedure: A/V FISTULAGRAM;  Surgeon: Algernon Huxley, MD;  Location: [place] [facility];  Service: Cardiovascular;  Laterality: Left;   A/V FISTULAGRAM Left 08/08/2021   Procedure: A/V FISTULAGRAM;  Surgeon: Algernon Huxley, MD;  Location: [place] [facility];  Service: Cardiovascular;  Laterality: Left;   A/V SHUNT INTERVENTION N/A 02/24/2017   Procedure: A/V Shunt Intervention;  Surgeon: Katha Cabal, MD;  Location: [place] [facility];  Service: Cardiovascular;  Laterality: N/A;   A/V SHUNT INTERVENTION N/A 11/14/2019   Procedure: A/V  SHUNT INTERVENTION;  Surgeon: Algernon Huxley, MD;  Location: [place] [facility];  Service: Cardiovascular;  Laterality: N/A;   AV FISTULA PLACEMENT Left    AV FISTULA PLACEMENT Left 12/02/2017   Procedure: ARTERIOVENOUS (AV) FISTULA CREATION ( BRACHIAL CEPHALIC );  Surgeon: Katha Cabal, MD;  Location: [facility];  Service: Vascular;  Laterality: Left;   CARDIAC CATHETERIZATION  2012   no stents   COLONOSCOPY Left 09/05/2018   Procedure: COLONOSCOPY;  Surgeon: Virgel Manifold, MD;  Location: [facility] ENDOSCOPY;  Service: Endoscopy;  Laterality: Left;   DIALYSIS/PERMA CATHETER REMOVAL N/A 08/24/2018   Procedure: DIALYSIS/PERMA CATHETER REMOVAL;  Surgeon: Katha Cabal, MD;  Location: [place] [facility];  Service: Cardiovascular;  Laterality: N/A;   ESOPHAGOGASTRODUODENOSCOPY Left 09/05/2018   Procedure: ESOPHAGOGASTRODUODENOSCOPY (EGD);  Surgeon: Virgel Manifold, MD;  Location: [facility] ENDOSCOPY;  Service: Endoscopy;  Laterality: Left;   ESOPHAGOGASTRODUODENOSCOPY (EGD) WITH PROPOFOL N/A 08/31/2018   Procedure: ESOPHAGOGASTRODUODENOSCOPY (EGD) WITH PROPOFOL;  Surgeon: Lucilla Lame, MD;  Location: [facility] ENDOSCOPY;  Service: Endoscopy;  Laterality: N/A;   GIVENS CAPSULE STUDY N/A 09/07/2018   Procedure: GIVENS CAPSULE STUDY;  Surgeon: Jonathon Bellows, MD;  Location: [facility] END[facility]COPY;  Service: Gastroenterology;  Laterality: N/A;   INSERTION OF DIALYSIS CATHETER Left 05/28/2018   Procedure: INSERTION OF DIALYSIS CATHETER ( TUNNELED CATH INSERT );  Surgeon: Katha Cabal, MD;  Location: [facility];  Service: Vascular;  Laterality: Left;   INSERTION OF DIALYSIS CATHETER N/A 11/21/2021   Procedure: INSERTION OF DIALYSIS CATHETER (PERMCATH);  Surgeon: Algernon Huxley, MD;  Location: [facility];  Service: Vascular;  Laterality: N/A;   LIGATION OF ARTERIOVENOUS  FISTULA Left 12/02/2017   Procedure: LIGATION OF ARTERIOVENOUS  FISTULA ( REMOVAL LEFT WRIST FISTULA );  Surgeon: Katha Cabal, MD;   Location: [facility];  Service: Vascular;  Laterality: Left;   PERIPHERAL VASCULAR CATHETERIZATION Left 06/03/2016   Procedure: A/V Shuntogram/Fistulagram;  Surgeon: Katha Cabal, MD;  Location: [place] [facility];  Service: Cardiovascular;  Laterality: Left;   PERIPHERAL VASCULAR CATHETERIZATION  06/03/2016   Procedure: Peripheral Vascular Balloon Angioplasty;  Surgeon: Katha Cabal, MD;  Location: [place] [facility];  Service: Cardiovascular;;   PERIPHERAL VASCULAR THROMBECTOMY Left 11/14/2019   Procedure: PERIPHERAL VASCULAR THROMBECTOMY;  Surgeon: Algernon Huxley, MD;  Location: [place] [facility];  Service: Cardiovascular;  Laterality: Left;   REVISON OF ARTERIOVENOUS FISTULA Left 05/28/2018   Procedure:  REVISON OF ARTERIOVENOUS FISTULA ( REMOVE ANEURYSM );  Surgeon: Katha Cabal, MD;  Location: [facility];  Service: Vascular;  Laterality: Left;   REVISON OF ARTERIOVENOUS FISTULA Left 11/21/2021   Procedure: REVISON OF ARTERIOVENOUS FISTULA (ARTEGRAFT);  Surgeon: Algernon Huxley, MD;  Location: [facility];  Service: Vascular;  Laterality: Left;   UPPER EXTREMITY VENOGRAPHY N/A 04/13/2018   Procedure: UPPER EXTREMITY VENOGRAPHY;  Surgeon: Katha Cabal, MD;  Location: [place] [facility];  Service: Cardiovascular;  Laterality: N/A;    Family History  Problem Relation Age of Onset   Heart disease Mother    Kidney disease Paternal Uncle     Allergies  Allergen Reactions   Dextromethorphan-Guaifenesin Nausea And Vomiting   Codeine Nausea And Vomiting   Doxycycline Nausea And Vomiting    CBC Latest Ref Rng & Units 11/21/2021 11/21/2021 09/29/2018  WBC 4.0 - 10.5 K/uL - 6.5 9.1  Hemoglobin 13.0 - 17.0 g/dL 13.6 13.3 8.6(L)  Hematocrit 39.0 - 52.0 % 40.0 39.7 26.6(L)  Platelets 150 - 400 K/uL - 237 229      CMP     Component Value Date/Time   NA 136 11/21/2021 1120   NA 138 05/18/2014 2142   K 4.2 11/21/2021 1120   K 5.0 06/28/2014 1254   CL 95 (L) 11/21/2021 1120    CL 98 05/18/2014 2142   CO2 32 11/21/2021 1113   CO2 33 (H) 05/18/2014 2142   GLUCOSE 107 (H) 11/21/2021 1120   GLUCOSE 88 05/18/2014 2142   BUN 20 11/21/2021 1120   BUN 12 05/18/2014 2142   CREATININE 7.00 (H) 11/21/2021 1120   CREATININE 5.73 (H) 05/18/2014 2142   CALCIUM 9.4 11/21/2021 1113   CALCIUM 8.3 (L) 05/18/2014 2142   PROT 6.9 09/24/2018 1554   PROT 7.2 07/05/2013 1100   ALBUMIN 3.5 09/24/2018 1554   ALBUMIN 4.1 07/05/2013 1100   AST 17 09/24/2018 1554   AST 20 07/05/2013 1100   ALT 11 09/24/2018 1554   ALT 24 07/05/2013 1100   ALKPHOS 81 09/24/2018 1554   ALKPHOS 102 07/05/2013 1100   BILITOT 0.7 09/24/2018 1554   BILITOT 0.4 07/05/2013 1100   GFRNONAA 9 (L) 11/21/2021 1113   GFRNONAA 10 (L) 05/18/2014 2142   GFRAA 5 (L) 09/29/2018 0404   GFRAA 12 (L) 05/18/2014 2142     No results found.     Assessment & Plan:   1. ESRD on hemodialysis [facility] We will have the patient return to the office in approximately 2 weeks for noninvasive studies to establish a baseline before he begins to use his new access.  Following this as long as it is stable we will send a letter to the dialysis center for use.   Current Outpatient Medications on File Prior to Visit  Medication Sig Dispense Refill   acetaminophen (TYLENOL) 500 MG tablet Take 1 tablet (500 mg total) by mouth every 6 (six) hours as needed for mild pain. 180 tablet 0   allopurinol (ZYLOPRIM) 100 MG tablet Take 200 mg by mouth daily.      ascorbic acid (VITAMIN C) 500 MG tablet Take 500 mg by mouth daily.     carvedilol (COREG CR) 10 MG 24 hr capsule Take 10 mg by mouth daily.      carvedilol (COREG) 25 MG tablet Take 25 mg by mouth.      cinacalcet (SENSIPAR) 60 MG tablet Take 180 mg by mouth daily.      cyanocobalamin 1000 MCG tablet  Take 1,000 mcg daily by mouth.     felodipine (PLENDIL) 2.5 MG 24 hr tablet Take 2.5 mg by mouth daily.      HYDROcodone-acetaminophen (NORCO/VICODIN) 5-325 MG tablet Take 2 tablets  by mouth every 6 (six) hours as needed for moderate pain. 20 tablet 0   levETIRAcetam (KEPPRA XR) 500 MG 24 hr tablet Take 500 mg by mouth See admin instructions. Take 500 mg twice a day and takes and additional 500 mg on Mon. Wed and Friday after dialysis     omeprazole (PRILOSEC) 20 MG capsule Take 20 mg by mouth daily as needed (Heartburn).      sevelamer carbonate (RENVELA) 800 MG tablet Take 2,400 mg by mouth 3 (three) times daily with meals.      VIMPAT 100 MG TABS Take 100 mg by mouth See admin instructions. Take 100 mg twice a day and extra 100 mg Mon. Wed. and Friday after dialysys     albuterol (VENTOLIN HFA) 108 (90 Base) MCG/ACT inhaler Inhale 2 puffs into the lungs every 6 (six) hours as needed for wheezing.  (Patient not taking: Reported on 11/21/2021)     aspirin 81 MG EC tablet Take 81 mg by mouth daily.  (Patient not taking: Reported on 08/08/2021)     beclomethasone (QVAR) 80 MCG/ACT inhaler Inhale 1 puff into the lungs as needed (for shortness of breath). (Patient not taking: Reported on 11/21/2021)     No current facility-administered medications on file prior to visit.    There are no Patient Instructions on file for this visit. No follow-ups on file.   Kris Hartmann, NP

## 2022-01-02 ENCOUNTER — Other Ambulatory Visit: Payer: Self-pay

## 2022-01-02 ENCOUNTER — Other Ambulatory Visit (INDEPENDENT_AMBULATORY_CARE_PROVIDER_SITE_OTHER): Payer: Self-pay | Admitting: Nurse Practitioner

## 2022-01-02 ENCOUNTER — Ambulatory Visit (INDEPENDENT_AMBULATORY_CARE_PROVIDER_SITE_OTHER): Payer: Medicare HMO

## 2022-01-02 ENCOUNTER — Ambulatory Visit (INDEPENDENT_AMBULATORY_CARE_PROVIDER_SITE_OTHER): Payer: Medicare HMO | Admitting: Nurse Practitioner

## 2022-01-02 ENCOUNTER — Encounter (INDEPENDENT_AMBULATORY_CARE_PROVIDER_SITE_OTHER): Payer: Self-pay | Admitting: Nurse Practitioner

## 2022-01-02 VITALS — BP 107/75 | HR 87 | Resp 16 | Wt 151.8 lb

## 2022-01-02 DIAGNOSIS — N186 End stage renal disease: Secondary | ICD-10-CM | POA: Diagnosis not present

## 2022-01-02 DIAGNOSIS — Z992 Dependence on renal dialysis: Secondary | ICD-10-CM

## 2022-01-06 ENCOUNTER — Telehealth (INDEPENDENT_AMBULATORY_CARE_PROVIDER_SITE_OTHER): Payer: Self-pay

## 2022-01-06 NOTE — Telephone Encounter (Signed)
Misty called from [facility] called and wanted to know how long should they wait before they can  start using the pts dialysis access.

## 2022-01-06 NOTE — Telephone Encounter (Signed)
They can stick now, I sent a letter last week

## 2022-01-06 NOTE — Telephone Encounter (Signed)
I called and made misty aware of the NP's instructions.

## 2022-01-12 ENCOUNTER — Encounter (INDEPENDENT_AMBULATORY_CARE_PROVIDER_SITE_OTHER): Payer: Self-pay | Admitting: Nurse Practitioner

## 2022-01-12 NOTE — Progress Notes (Signed)
Subjective:    Patient ID: Jeremy Rose, male    DOB: [birth year], 66 y.o.   MRN: [id number] Chief Complaint  Patient presents with   Routine Post Op    [facility] 3 week follow up    She returns to the after recent placement of a jump graft in his left upper extremity.  He notes that the upper extremity feels much better following the surgery.  All incisions are healing well.  Overall he is doing well post surgery.  Today noninvasive studies show flow volume of 2042.  No areas of stenosis noted.  No flow noted within the previously aneurysmal area.   Review of Systems  All other systems reviewed and are negative.     Objective:   Physical Exam Vitals reviewed.  HENT:     Head: Normocephalic.  Cardiovascular:     Rate and Rhythm: Normal rate.     Arteriovenous access: Left arteriovenous access is present.    Comments: Good thrill and bruit Pulmonary:     Effort: Pulmonary effort is normal.  Skin:    General: Skin is warm and dry.  Neurological:     Mental Status: He is alert and oriented to person, place, and time.  Psychiatric:        Mood and Affect: Mood normal.        Behavior: Behavior normal.        Thought Content: Thought content normal.        Judgment: Judgment normal.    BP 107/75 (BP Location: Right Arm)    Pulse 87    Resp 16    Wt 151 lb 12.8 oz (68.9 kg)    BMI 23.08 kg/m   Past Medical History:  Diagnosis Date   Anemia    Arthritis    hands   Asthma    uses inhaler   Coronary artery disease    Dialysis patient [facility]) 2012   Monday, Wednesday, Friday   GERD (gastroesophageal reflux disease)    Gout of right hand    Hypertension    Pneumonia    Renal disorder    stage V..on dialysis   Seizures ([place]) 2019   last seizure 5 years ago. had 20 in one day   Stroke [facility])     Social History   Socioeconomic History   Marital status: Single    Spouse name: Not on file   Number of children: 0   Years of education: Not on file   Highest  education level: Not on file  Occupational History   Occupation: disabled  Tobacco Use   Smoking status: Former    Types: Cigarettes   Smokeless tobacco: Former    Quit date: 2010   Tobacco comments:    did not inhale!!  Scientific laboratory technician Use: Never used  Substance and Sexual Activity   Alcohol use: No   Drug use: No   Sexual activity: Not Currently  Other Topics Concern   Not on file  Social History Narrative   Lives by himself with close neighbors who help him    Social Determinants of Health   Financial Resource Strain: Not on file  Food Insecurity: Not on file  Transportation Needs: Not on file  Physical Activity: Not on file  Stress: Not on file  Social Connections: Not on file  Intimate Partner Violence: Not on file    Past Surgical History:  Procedure Laterality Date   A/V FISTULAGRAM Left 02/24/2017  Procedure: A/V Fistulagram;  Surgeon: Katha Cabal, MD;  Location: [place] [facility];  Service: Cardiovascular;  Laterality: Left;   A/V FISTULAGRAM Left 07/20/2019   Procedure: A/V FISTULAGRAM;  Surgeon: Algernon Huxley, MD;  Location: [place] [facility];  Service: Cardiovascular;  Laterality: Left;   A/V FISTULAGRAM Left 04/03/2020   Procedure: A/V FISTULAGRAM;  Surgeon: Algernon Huxley, MD;  Location: [place] [facility];  Service: Cardiovascular;  Laterality: Left;   A/V FISTULAGRAM Left 09/06/2020   Procedure: A/V FISTULAGRAM;  Surgeon: Algernon Huxley, MD;  Location: [place] [facility];  Service: Cardiovascular;  Laterality: Left;   A/V FISTULAGRAM Left 01/17/2021   Procedure: A/V FISTULAGRAM;  Surgeon: Algernon Huxley, MD;  Location: [place] [facility];  Service: Cardiovascular;  Laterality: Left;   A/V FISTULAGRAM Left 08/08/2021   Procedure: A/V FISTULAGRAM;  Surgeon: Algernon Huxley, MD;  Location: [place] [facility];  Service: Cardiovascular;  Laterality: Left;   A/V SHUNT INTERVENTION N/A 02/24/2017   Procedure: A/V Shunt Intervention;  Surgeon: Katha Cabal, MD;  Location: [place] [facility];  Service: Cardiovascular;  Laterality: N/A;   A/V SHUNT INTERVENTION N/A 11/14/2019   Procedure: A/V SHUNT INTERVENTION;  Surgeon: Algernon Huxley, MD;  Location: [place] [facility];  Service: Cardiovascular;  Laterality: N/A;   AV FISTULA PLACEMENT Left    AV FISTULA PLACEMENT Left 12/02/2017   Procedure: ARTERIOVENOUS (AV) FISTULA CREATION ( BRACHIAL CEPHALIC );  Surgeon: Katha Cabal, MD;  Location: [facility];  Service: Vascular;  Laterality: Left;   CARDIAC CATHETERIZATION  2012   no stents   COLONOSCOPY Left 09/05/2018   Procedure: COLONOSCOPY;  Surgeon: Virgel Manifold, MD;  Location: [facility] ENDOSCOPY;  Service: Endoscopy;  Laterality: Left;   DIALYSIS/PERMA CATHETER REMOVAL N/A 08/24/2018   Procedure: DIALYSIS/PERMA CATHETER REMOVAL;  Surgeon: Katha Cabal, MD;  Location: [place] [facility];  Service: Cardiovascular;  Laterality: N/A;   ESOPHAGOGASTRODUODENOSCOPY Left 09/05/2018   Procedure: ESOPHAGOGASTRODUODENOSCOPY (EGD);  Surgeon: Virgel Manifold, MD;  Location: [facility] ENDOSCOPY;  Service: Endoscopy;  Laterality: Left;   ESOPHAGOGASTRODUODENOSCOPY (EGD) WITH PROPOFOL N/A 08/31/2018   Procedure: ESOPHAGOGASTRODUODENOSCOPY (EGD) WITH PROPOFOL;  Surgeon: Lucilla Lame, MD;  Location: [facility] ENDOSCOPY;  Service: Endoscopy;  Laterality: N/A;   GIVENS CAPSULE STUDY N/A 09/07/2018   Procedure: GIVENS CAPSULE STUDY;  Surgeon: Jonathon Bellows, MD;  Location: [facility];  Service: Gastroenterology;  Laterality: N/A;   INSERTION OF DIALYSIS CATHETER Left 05/28/2018   Procedure: INSERTION OF DIALYSIS CATHETER ( TUNNELED CATH INSERT );  Surgeon: Katha Cabal, MD;  Location: [facility];  Service: Vascular;  Laterality: Left;   INSERTION OF DIALYSIS CATHETER N/A 11/21/2021   Procedure: INSERTION OF DIALYSIS CATHETER (PERMCATH);  Surgeon: Algernon Huxley, MD;  Location: [facility];  Service: Vascular;  Laterality: N/A;   LIGATION OF  ARTERIOVENOUS  FISTULA Left 12/02/2017   Procedure: LIGATION OF ARTERIOVENOUS  FISTULA ( REMOVAL LEFT WRIST FISTULA );  Surgeon: Katha Cabal, MD;  Location: [facility];  Service: Vascular;  Laterality: Left;   PERIPHERAL VASCULAR CATHETERIZATION Left 06/03/2016   Procedure: A/V Shuntogram/Fistulagram;  Surgeon: Katha Cabal, MD;  Location: [place] [facility];  Service: Cardiovascular;  Laterality: Left;   PERIPHERAL VASCULAR CATHETERIZATION  06/03/2016   Procedure: Peripheral Vascular Balloon Angioplasty;  Surgeon: Katha Cabal, MD;  Location: [place] [facility];  Service: Cardiovascular;;   PERIPHERAL VASCULAR THROMBECTOMY Left 11/14/2019   Procedure: PERIPHERAL VASCULAR THROMBECTOMY;  Surgeon: Algernon Huxley, MD;  Location: [place] [facility];  Service: Cardiovascular;  Laterality: Left;   REVISON OF ARTERIOVENOUS FISTULA Left 05/28/2018   Procedure: REVISON OF ARTERIOVENOUS FISTULA ( REMOVE ANEURYSM );  Surgeon: Katha Cabal, MD;  Location: [facility];  Service: Vascular;  Laterality: Left;   REVISON OF ARTERIOVENOUS FISTULA Left 11/21/2021   Procedure: REVISON OF ARTERIOVENOUS FISTULA (ARTEGRAFT);  Surgeon: Algernon Huxley, MD;  Location: [facility];  Service: Vascular;  Laterality: Left;   UPPER EXTREMITY VENOGRAPHY N/A 04/13/2018   Procedure: UPPER EXTREMITY VENOGRAPHY;  Surgeon: Katha Cabal, MD;  Location: [place] [facility];  Service: Cardiovascular;  Laterality: N/A;    Family History  Problem Relation Age of Onset   Heart disease Mother    Kidney disease Paternal Uncle     Allergies  Allergen Reactions   Dextromethorphan-Guaifenesin Nausea And Vomiting   Codeine Nausea And Vomiting   Doxycycline Nausea And Vomiting    CBC Latest Ref Rng & Units 11/21/2021 11/21/2021 09/29/2018  WBC 4.0 - 10.5 K/uL - 6.5 9.1  Hemoglobin 13.0 - 17.0 g/dL 13.6 13.3 8.6(L)  Hematocrit 39.0 - 52.0 % 40.0 39.7 26.6(L)  Platelets 150 - 400 K/uL - 237 229      CMP      Component Value Date/Time   NA 136 11/21/2021 1120   NA 138 05/18/2014 2142   K 4.2 11/21/2021 1120   K 5.0 06/28/2014 1254   CL 95 (L) 11/21/2021 1120   CL 98 05/18/2014 2142   CO2 32 11/21/2021 1113   CO2 33 (H) 05/18/2014 2142   GLUCOSE 107 (H) 11/21/2021 1120   GLUCOSE 88 05/18/2014 2142   BUN 20 11/21/2021 1120   BUN 12 05/18/2014 2142   CREATININE 7.00 (H) 11/21/2021 1120   CREATININE 5.73 (H) 05/18/2014 2142   CALCIUM 9.4 11/21/2021 1113   CALCIUM 8.3 (L) 05/18/2014 2142   PROT 6.9 09/24/2018 1554   PROT 7.2 07/05/2013 1100   ALBUMIN 3.5 09/24/2018 1554   ALBUMIN 4.1 07/05/2013 1100   AST 17 09/24/2018 1554   AST 20 07/05/2013 1100   ALT 11 09/24/2018 1554   ALT 24 07/05/2013 1100   ALKPHOS 81 09/24/2018 1554   ALKPHOS 102 07/05/2013 1100   BILITOT 0.7 09/24/2018 1554   BILITOT 0.4 07/05/2013 1100   GFRNONAA 9 (L) 11/21/2021 1113   GFRNONAA 10 (L) 05/18/2014 2142   GFRAA 5 (L) 09/29/2018 0404   GFRAA 12 (L) 05/18/2014 2142     No results found.     Assessment & Plan:   1. ESRD on hemodialysis [facility]) Today noninvasive studies show that the access he currently has is adequate for usage.  We will send a letter to the patient's dialysis center to inform them that it is ready for use.  We will plan on having the patient return in 6 months with noninvasive center or sooner if issues arise.   Current Outpatient Medications on File Prior to Visit  Medication Sig Dispense Refill   acetaminophen (TYLENOL) 500 MG tablet Take 1 tablet (500 mg total) by mouth every 6 (six) hours as needed for mild pain. 180 tablet 0   allopurinol (ZYLOPRIM) 100 MG tablet Take 200 mg by mouth daily.      ascorbic acid (VITAMIN C) 500 MG tablet Take 500 mg by mouth daily.     carvedilol (COREG CR) 10 MG 24 hr capsule Take 10 mg by mouth daily.      carvedilol (  COREG) 25 MG tablet Take 25 mg by mouth.      cinacalcet (SENSIPAR) 60 MG tablet Take 180 mg by mouth daily.      cyanocobalamin  1000 MCG tablet Take 1,000 mcg daily by mouth.     felodipine (PLENDIL) 2.5 MG 24 hr tablet Take 2.5 mg by mouth daily.      HYDROcodone-acetaminophen (NORCO/VICODIN) 5-325 MG tablet Take 2 tablets by mouth every 6 (six) hours as needed for moderate pain. 20 tablet 0   levETIRAcetam (KEPPRA XR) 500 MG 24 hr tablet Take 500 mg by mouth See admin instructions. Take 500 mg twice a day and takes and additional 500 mg on Mon. Wed and Friday after dialysis     omeprazole (PRILOSEC) 20 MG capsule Take 20 mg by mouth daily as needed (Heartburn).      sevelamer carbonate (RENVELA) 800 MG tablet Take 2,400 mg by mouth 3 (three) times daily with meals.      VIMPAT 100 MG TABS Take 100 mg by mouth See admin instructions. Take 100 mg twice a day and extra 100 mg Mon. Wed. and Friday after dialysys     albuterol (VENTOLIN HFA) 108 (90 Base) MCG/ACT inhaler Inhale 2 puffs into the lungs every 6 (six) hours as needed for wheezing.  (Patient not taking: Reported on 11/21/2021)     aspirin 81 MG EC tablet Take 81 mg by mouth daily.  (Patient not taking: Reported on 08/08/2021)     beclomethasone (QVAR) 80 MCG/ACT inhaler Inhale 1 puff into the lungs as needed (for shortness of breath). (Patient not taking: Reported on 11/21/2021)     No current facility-administered medications on file prior to visit.    There are no Patient Instructions on file for this visit. No follow-ups on file.   Kris Hartmann, NP

## 2022-01-24 ENCOUNTER — Telehealth (INDEPENDENT_AMBULATORY_CARE_PROVIDER_SITE_OTHER): Payer: Self-pay

## 2022-01-24 NOTE — Telephone Encounter (Signed)
Received a fax from [facility] to have the patient scheduled for a permcath removal on a Tuesday or Thursday. Patient has been scheduled with [name] for a permcath removal on 01/30/22 with a 11:00 am arrival time to the MM. Pre-procedure instructions will be faxed to [facility]. ?

## 2022-01-30 ENCOUNTER — Encounter: Payer: Self-pay | Admitting: Certified Registered"

## 2022-01-30 ENCOUNTER — Ambulatory Visit
Admission: RE | Admit: 2022-01-30 | Discharge: 2022-01-30 | Disposition: A | Discharge status: Home / Self Care | Payer: Medicare HMO | Attending: Vascular Surgery | Admitting: Vascular Surgery

## 2022-01-30 ENCOUNTER — Encounter
Admission: RE | Disposition: A | Discharge status: Home / Self Care | Payer: Self-pay | Source: Home / Self Care | Attending: Vascular Surgery

## 2022-01-30 ENCOUNTER — Encounter: Payer: Self-pay | Admitting: Vascular Surgery

## 2022-01-30 DIAGNOSIS — I120 Hypertensive chronic kidney disease with stage 5 chronic kidney disease or end stage renal disease: Secondary | ICD-10-CM | POA: Diagnosis not present

## 2022-01-30 DIAGNOSIS — Z4901 Encounter for fitting and adjustment of extracorporeal dialysis catheter: Secondary | ICD-10-CM

## 2022-01-30 DIAGNOSIS — Z992 Dependence on renal dialysis: Secondary | ICD-10-CM

## 2022-01-30 DIAGNOSIS — Z8673 Personal history of transient ischemic attack (TIA), and cerebral infarction without residual deficits: Secondary | ICD-10-CM | POA: Insufficient documentation

## 2022-01-30 DIAGNOSIS — I2510 Atherosclerotic heart disease of native coronary artery without angina pectoris: Secondary | ICD-10-CM | POA: Diagnosis not present

## 2022-01-30 DIAGNOSIS — R569 Unspecified convulsions: Secondary | ICD-10-CM | POA: Diagnosis not present

## 2022-01-30 DIAGNOSIS — Y841 Kidney dialysis as the cause of abnormal reaction of the patient, or of later complication, without mention of misadventure at the time of the procedure: Secondary | ICD-10-CM | POA: Insufficient documentation

## 2022-01-30 DIAGNOSIS — N186 End stage renal disease: Secondary | ICD-10-CM | POA: Insufficient documentation

## 2022-01-30 DIAGNOSIS — E1122 Type 2 diabetes mellitus with diabetic chronic kidney disease: Secondary | ICD-10-CM | POA: Diagnosis not present

## 2022-01-30 DIAGNOSIS — Z79899 Other long term (current) drug therapy: Secondary | ICD-10-CM | POA: Diagnosis not present

## 2022-01-30 DIAGNOSIS — T8249XA Other complication of vascular dialysis catheter, initial encounter: Secondary | ICD-10-CM | POA: Insufficient documentation

## 2022-01-30 DIAGNOSIS — Z87891 Personal history of nicotine dependence: Secondary | ICD-10-CM | POA: Diagnosis not present

## 2022-01-30 SURGERY — DIALYSIS/PERMA CATHETER REMOVAL
Anesthesia: LOCAL

## 2022-01-30 SURGICAL SUPPLY — 3 items
CHLORAPREP W/TINT 26 (MISCELLANEOUS) ×1 IMPLANT
FORCEPS HALSTEAD CVD 5IN STRL (INSTRUMENTS) ×1 IMPLANT
TRAY LACERAT/PLASTIC (MISCELLANEOUS) ×1 IMPLANT

## 2022-01-30 NOTE — Op Note (Signed)
Operative Note ? ? ? ? ?Preoperative diagnosis:   1. ESRD with functional permanent access ? ?Postoperative diagnosis:  1. ESRD with functional permanent access ? ?Procedure:  Removal of left jugular Permcath ? ?Surgeon:  [name], MD ? ?Anesthesia:  Local ? ?EBL:  Minimal ? ?Indication for the Procedure:  The patient has a functional permanent dialysis access and no longer needs their permcath.  This can be removed.  Risks and benefits are discussed and informed consent is obtained. ? ?Description of the Procedure:  The patient's left neck, chest and existing catheter were sterilely prepped and draped. The area around the catheter was anesthetized copiously with 1% lidocaine. The catheter was dissected out with curved hemostats until the cuff was freed from the surrounding fibrous sheath. The fiber sheath was transected, and the catheter was then removed in its entirety using gentle traction. Pressure was held and sterile dressings were placed. The patient tolerated the procedure well and was taken to the recovery room in stable condition. ? ? ? ? ?[name] ? ?01/30/2022, 12:01 PM ?This note was created with Dragon Medical transcription system. Any errors in dictation are purely unintentional.  ?

## 2022-01-30 NOTE — Discharge Instructions (Signed)
Tunneled Catheter Removal, Care After Refer to this sheet in the next few weeks. These instructions provide you with information about caring for yourself after your procedure. Your health care provider may also give you more specific instructions. Your treatment has been planned according to current medical practices, but problems sometimes occur. Call your health care provider if you have any problems or questions after your procedure. What can I expect after the procedure? After the procedure, it is common to have: Some mild redness, swelling, and pain around your catheter site.   Follow these instructions at home: Incision care  Check your removal site  every day for signs of infection. Check for: More redness, swelling, or pain. More fluid or blood. Warmth. Pus or a bad smell. Remove your dressing in 48hrs leave open to air  Activity  Return to your normal activities as told by your health care provider. Ask your health care provider what activities are safe for you. Do not lift anything that is heavier than 10 lb (4.5 kg) for 3 days  You may shower tomorrow  Contact a health care provider if: You have more fluid or blood coming from your removal site You have more redness, swelling, or pain at your incisions or around the area where your catheter was removed Your removal site feel warm to the touch. You feel unusually weak. You feel nauseous.. Get help right away if You have swelling in your arm, shoulder, neck, or face. You develop chest pain. You have difficulty breathing. You feel dizzy or light-headed. You have pus or a bad smell coming from your removal site You have a fever. You develop bleeding from your removal site, and your bleeding does not stop. This information is not intended to replace advice given to you by your health care provider. Make sure you discuss any questions you have with your health care provider. Document Released: 10/20/2012 Document Revised:  07/06/2016 Document Reviewed: 07/30/2015 Elsevier Interactive Patient Education  2017 [employer] 

## 2022-02-03 NOTE — H&P (Signed)
[facility] VE[facility] Admission History & Physical  MRN : [id number]  Jeremy Rose is a 66 y.o. (Jun 06, 1956) male who presents with chief complaint of No chief complaint on file. Marland Kitchen  History of Present Illness: I am asked to evaluate the patient by the dialysis center. The patient was sent here because they have a nonfunctioning tunneled catheter and a functioning arm access.  The patient reports they're not been any problems with any of their dialysis runs. They are reporting good flows with good parameters at dialysis.   Patient denies pain or tenderness overlying the access.  There is no pain with dialysis.  The patient denies hand pain or finger pain consistent with steal syndrome.  No fevers or chills while on dialysis.    No current facility-administered medications for this encounter.   Current Outpatient Medications  Medication Sig Dispense Refill   acetaminophen (TYLENOL) 500 MG tablet Take 1 tablet (500 mg total) by mouth every 6 (six) hours as needed for mild pain. 180 tablet 0   albuterol (VENTOLIN HFA) 108 (90 Base) MCG/ACT inhaler Inhale 2 puffs into the lungs every 6 (six) hours as needed for wheezing.  (Patient not taking: Reported on 11/21/2021)     allopurinol (ZYLOPRIM) 100 MG tablet Take 200 mg by mouth daily.      ascorbic acid (VITAMIN C) 500 MG tablet Take 500 mg by mouth daily.     aspirin 81 MG EC tablet Take 81 mg by mouth daily.  (Patient not taking: Reported on 08/08/2021)     beclomethasone (QVAR) 80 MCG/ACT inhaler Inhale 1 puff into the lungs as needed (for shortness of breath). (Patient not taking: Reported on 11/21/2021)     carvedilol (COREG CR) 10 MG 24 hr capsule Take 10 mg by mouth daily.      carvedilol (COREG) 25 MG tablet Take 25 mg by mouth.      cinacalcet (SENSIPAR) 60 MG tablet Take 180 mg by mouth daily.      cinacalcet (SENSIPAR) 90 MG tablet Take 180 mg by mouth daily.     cyanocobalamin 1000 MCG tablet Take 1,000 mcg daily by  mouth.     felodipine (PLENDIL) 2.5 MG 24 hr tablet Take 2.5 mg by mouth daily.      FLOVENT HFA 110 MCG/ACT inhaler Inhale 2 puffs into the lungs 2 (two) times daily.     HYDROcodone-acetaminophen (NORCO/VICODIN) 5-325 MG tablet Take 2 tablets by mouth every 6 (six) hours as needed for moderate pain. 20 tablet 0   lacosamide (VIMPAT) 50 MG TABS tablet Take by mouth.     Lacosamide 150 MG TABS Take 1 tablet by mouth 2 (two) times daily.     levETIRAcetam (KEPPRA XR) 500 MG 24 hr tablet Take 500 mg by mouth See admin instructions. Take 500 mg twice a day and takes and additional 500 mg on Mon. Wed and Friday after dialysis     levETIRAcetam (KEPPRA) 500 MG tablet Take by mouth.     omeprazole (PRILOSEC) 20 MG capsule Take 20 mg by mouth daily as needed (Heartburn).      QVAR REDIHALER 80 MCG/ACT inhaler Inhale 1 puff into the lungs 2 (two) times daily.     sevelamer carbonate (RENVELA) 800 MG tablet Take 2,400 mg by mouth 3 (three) times daily with meals.      VIMPAT 100 MG TABS Take 100 mg by mouth See admin instructions. Take 100 mg twice a day and extra 100 mg Mon.  Wed. and Friday after dialysys      Past Medical History:  Diagnosis Date   Anemia    Arthritis    hands   Asthma    uses inhaler   Coronary artery disease    Dialysis patient [facility]) 2012   Monday, Wednesday, Friday   GERD (gastroesophageal reflux disease)    Gout of right hand    Hypertension    Pneumonia    Renal disorder    stage V..on dialysis   Seizures (HCC) 2019   last seizure 5 years ago. had 20 in one day   Stroke [facility])     Past Surgical History:  Procedure Laterality Date   A/V FISTULAGRAM Left 02/24/2017   Procedure: A/V Fistulagram;  Surgeon: Renford Dills, MD;  Location: [facility] INVASIVE CV LAB;  Service: Cardiovascular;  Laterality: Left;   A/V FISTULAGRAM Left 07/20/2019   Procedure: A/V FISTULAGRAM;  Surgeon: Annice Needy, MD;  Location: [facility] INV[facility] CV [facility];  Service: Cardiovascular;  Laterality:  Left;   A/V FISTULAGRAM Left 04/03/2020   Procedure: A/V FISTULAGRAM;  Surgeon: Annice Needy, MD;  Location: [facility] IN[facility] CV [facility];  Service: Cardiovascular;  Laterality: Left;   A/V FISTULAGRAM Left 09/06/2020   Procedure: A/V FISTULAGRAM;  Surgeon: Annice Needy, MD;  Location: [facility];  Service: Cardiovascular;  Laterality: Left;   A/V FISTULAGRAM Left 01/17/2021   Procedure: A/V FISTULAGRAM;  Surgeon: Annice Needy, MD;  Location: [facility];  Service: Cardiovascular;  Laterality: Left;   A/V FISTULAGRAM Left 08/08/2021   Procedure: A/V FISTULAGRAM;  Surgeon: Annice Needy, MD;  Location: [facility];  Service: Cardiovascular;  Laterality: Left;   A/V SHUNT INTERVENTION N/A 02/24/2017   Procedure: A/V Shunt Intervention;  Surgeon: Renford Dills, MD;  Location: [facility];  Service: Cardiovascular;  Laterality: N/A;   A/V SHUNT INTERVENTION N/A 11/14/2019   Procedure: A/V SHUNT INTERVENTION;  Surgeon: Annice Needy, MD;  Location: [facility];  Service: Cardiovascular;  Laterality: N/A;   AV FISTULA PLACEMENT Left    AV FISTULA PLACEMENT Left 12/02/2017   Procedure: ARTERIOVENOUS (AV) FISTULA CREATION ( BRACHIAL CEPHALIC );  Surgeon: Renford Dills, MD;  Location: [facility] ORS;  Service: Vascular;  Laterality: Left;   CARDIAC CATHETERIZATION  2012   no stents   COLONOSCOPY Left 09/05/2018   Procedure: COLONOSCOPY;  Surgeon: Pasty Spillers, MD;  Location: [facility] ENDOSCOPY;  Service: Endoscopy;  Laterality: Left;   DIALYSIS/PERMA CATHETER REMOVAL N/A 08/24/2018   Procedure: DIALYSIS/PERMA CATHETER REMOVAL;  Surgeon: Renford Dills, MD;  Location: [facility];  Service: Cardiovascular;  Laterality: N/A;   DIALYSIS/PERMA CATHETER REMOVAL N/A 01/30/2022   Procedure: DIALYSIS/PERMA CATHETER REMOVAL;  Surgeon: Annice Needy, MD;  Location: [facility];  Service: Cardiovascular;  Laterality: N/A;   ESOPHAGOGASTRODUODENOSCOPY Left  09/05/2018   Procedure: ESOPHAGOGASTRODUODENOSCOPY (EGD);  Surgeon: Pasty Spillers, MD;  Location: [facility] ENDOSCOPY;  Service: Endoscopy;  Laterality: Left;   ESOPHAGOGASTRODUODENOSCOPY (EGD) WITH PROPOFOL N/A 08/31/2018   Procedure: ESOPHAGOGASTRODUODENOSCOPY (EGD) WITH PROPOFOL;  Surgeon: Midge Minium, MD;  Location: [facility] ENDOSCOPY;  Service: Endoscopy;  Laterality: N/A;   GIVENS CAPSULE STUDY N/A 09/07/2018   Procedure: GIVENS CAPSULE STUDY;  Surgeon: Wyline Mood, MD;  Location: [facility] ENDOSCOPY;  Service: Gastroenterology;  Laterality: N/A;   INSERTION OF DIALYSIS CATHETER Left 05/28/2018   Procedure: INSERTION OF DIALYSIS CATHETER ( TUNNELED CATH INSERT );  Surgeon: Levora Dredge  G, MD;  Location: [facility] ORS;  Service: Vascular;  Laterality: Left;   INSERTION OF DIALYSIS CATHETER N/A 11/21/2021   Procedure: INSERTION OF DIALYSIS CATHETER (PERMCATH);  Surgeon: Annice Needy, MD;  Location: [facility] ORS;  Service: Vascular;  Laterality: N/A;   LIGATION OF ARTERIOVENOUS  FISTULA Left 12/02/2017   Procedure: LIGATION OF ARTERIOVENOUS  FISTULA ( REMOVAL LEFT WRIST FISTULA );  Surgeon: Renford Dills, MD;  Location: [facility] ORS;  Service: Vascular;  Laterality: Left;   PERIPHERAL VASCULAR CATHETERIZATION Left 06/03/2016   Procedure: A/V Shuntogram/Fistulagram;  Surgeon: Renford Dills, MD;  Location: [facility];  Service: Cardiovascular;  Laterality: Left;   PERIPHERAL VASCULAR CATHETERIZATION  06/03/2016   Procedure: Peripheral Vascular Balloon Angioplasty;  Surgeon: Renford Dills, MD;  Location: [facility];  Service: Cardiovascular;;   PERIPHERAL VASCULAR THROMBECTOMY Left 11/14/2019   Procedure: PERIPHERAL VASCULAR THROMBECTOMY;  Surgeon: Annice Needy, MD;  Location: [facility];  Service: Cardiovascular;  Laterality: Left;   REVISON OF ARTERIOVENOUS FISTULA Left 05/28/2018   Procedure: REVISON OF ARTERIOVENOUS FISTULA ( REMOVE ANEURYSM );  Surgeon: Renford Dills, MD;  Location: [facility] OR[facility];  Service: Vascular;  Laterality: Left;   REVISON OF ARTERIOVENOUS FISTULA Left 11/21/2021   Procedure: REVISON OF ARTERIOVENOUS FISTULA (ARTEGRAFT);  Surgeon: Annice Needy, MD;  Location: [facility] ORS;  Service: Vascular;  Laterality: Left;   UPPER EXTREMITY VENOGRAPHY N/A 04/13/2018   Procedure: UPPER EXTREMITY VENOGRAPHY;  Surgeon: Renford Dills, MD;  Location: [facility];  Service: Cardiovascular;  Laterality: N/A;     Social History   Tobacco Use   Smoking status: Former    Types: Cigarettes   Smokeless tobacco: Former    Quit date: 2010   Tobacco comments:    did not inhale!!  Building services engineer Use: Never used  Substance Use Topics   Alcohol use: No   Drug use: No     Family History  Problem Relation Age of Onset   Heart disease Mother    Kidney disease Paternal Uncle     No family history of bleeding or clotting disorders, autoimmune disease or porphyria  Allergies  Allergen Reactions   Dextromethorphan-Guaifenesin Nausea And Vomiting   Codeine Nausea And Vomiting   Doxycycline Nausea And Vomiting     REVIEW OF SYSTEMS (Negative unless checked)  Constitutional: [] Weight loss  [] Fever  [] Chills Cardiac: [] Chest pain   [] Chest pressure   [] Palpitations   [] Shortness of breath when laying flat   [] Shortness of breath at rest   [x] Shortness of breath with exertion. Vascular:  [] Pain in legs with walking   [] Pain in legs at rest   [] Pain in legs when laying flat   [] Claudication   [] Pain in feet when walking  [] Pain in feet at rest  [] Pain in feet when laying flat   [] History of DVT   [] Phlebitis   [] Swelling in legs   [] Varicose veins   [] Non-healing ulcers Pulmonary:   [] Uses home oxygen   [] Productive cough   [] Hemoptysis   [] Wheeze  [] COPD   [] Asthma Neurologic:  [] Dizziness  [] Blackouts   [] Seizures   [] History of stroke   [] History of TIA  [] Aphasia   [] Temporary blindness   [] Dysphagia   [] Weakness or numbness in arms    [] Weakness or numbness in legs Musculoskeletal:  [] Arthritis   [] Joint swelling   [] Joint pain   [] Low back pain Hematologic:  [] Easy bruising  [] Easy  bleeding   [] Hypercoagulable state   [] Anemic  [] Hepatitis Gastrointestinal:  [] Blood in stool   [] Vomiting blood  [] Gastroesophageal reflux/heartburn   [] Difficulty swallowing. Genitourinary:  [x] Chronic kidney disease   [] Difficult urination  [] Frequent urination  [] Burning with urination   [] Blood in urine Skin:  [] Rashes   [] Ulcers   [] Wounds Psychological:  [] History of anxiety   []  History of major depression.  Physical Examination  Vitals:   01/30/22 1146 01/30/22 1151 01/30/22 1156  BP:  95/67 97/70  Pulse: (!) 0 68 (!) 56  Resp:  12 13  SpO2:  100% 100%   There is no height or weight on file to calculate BMI. Gen: WD/WN, NAD Head: [place]/AT, No temporalis wasting. Ear/Nose/Throat: Hearing grossly intact, nares w/o erythema or drainage, oropharynx w/o Erythema/Exudate,  Eyes: Conjunctiva clear, sclera non-icteric Neck: Trachea midline.  No JVD.  Pulmonary:  Good air movement, respirations not labored, no use of accessory muscles.  Cardiac: RRR, normal S1, S2. Vascular: good thrill in left arm AVF Vessel Right Left  Radial Palpable Palpable  Ulnar Not Palpable Not Palpable  Brachial Palpable Palpable  Carotid Palpable, without bruit Palpable, without bruit  Gastrointestinal: soft, non-tender/non-distended. No guarding/reflex.  Musculoskeletal: M/S 5/5 throughout.  Extremities without ischemic changes.  No deformity or atrophy.  Neurologic: Sensation grossly intact in extremities.  Symmetrical.  Speech is fluent. Motor exam as listed above. Psychiatric: Judgment intact, Mood & affect appropriate for pt's clinical situation. Dermatologic: No rashes or ulcers noted.  No cellulitis or open wounds.    CBC Lab Results  Component Value Date   WBC 6.5 11/21/2021   HGB 13.6 11/21/2021   HCT 40.0 11/21/2021   MCV 98.3 11/21/2021    PLT 237 11/21/2021    BMET    Component Value Date/Time   NA 136 11/21/2021 1120   NA 138 05/18/2014 2142   K 4.2 11/21/2021 1120   K 5.0 06/28/2014 1254   CL 95 (L) 11/21/2021 1120   CL 98 05/18/2014 2142   CO2 32 11/21/2021 1113   CO2 33 (H) 05/18/2014 2142   GLUCOSE 107 (H) 11/21/2021 1120   GLUCOSE 88 05/18/2014 2142   BUN 20 11/21/2021 1120   BUN 12 05/18/2014 2142   CREATININE 7.00 (H) 11/21/2021 1120   CREATININE 5.73 (H) 05/18/2014 2142   CALCIUM 9.4 11/21/2021 1113   CALCIUM 8.3 (L) 05/18/2014 2142   GFRNONAA 9 (L) 11/21/2021 1113   GFRNONAA 10 (L) 05/18/2014 2142   GFRAA 5 (L) 09/29/2018 0404   GFRAA 12 (L) 05/18/2014 2142   CrCl cannot be calculated (Patient's most recent lab result is older than the maximum 21 days allowed.).  COAG Lab Results  Component Value Date   INR 0.94 05/18/2018   INR 0.94 11/24/2017    Radiology PERIPHERAL VASCULAR CATHETERIZATION  Result Date: 01/30/2022 See surgical note for result.   Assessment/Plan 1.  Complication dialysis device:  Patient's Tunneled catheter is not being used. The patient has an extremity access that is functioning well. Therefore, the patient will undergo removal of the tunneled catheter under local anesthesia.  The risks and benefits were described to the patient.  All questions were answered.  The patient agrees to proceed with angiography and intervention. Potassium will be drawn to ensure that it is an appropriate level prior to performing intervention. 2.  End-stage renal disease requiring hemodialysis:  Patient will continue dialysis therapy without further interruption if a successful intervention is not achieved then a tunneled catheter will be  placed. Dialysis has already been arranged. 3.  Hypertension:  Patient will continue medical management; nephrology is following no changes in oral medications. 4. Diabetes mellitus:  Glucose will be monitored and oral medications been held this morning once  the patient has undergone the patient's procedure po intake will be reinitiated and again Accu-Cheks will be used to assess the blood glucose level and treat as needed. The patient will be restarted on the patient's usual hypoglycemic regime     Festus Barren, MD  02/03/2022 8:18 AM

## 2022-06-10 ENCOUNTER — Telehealth (INDEPENDENT_AMBULATORY_CARE_PROVIDER_SITE_OTHER): Payer: Self-pay | Admitting: Vascular Surgery

## 2022-06-10 NOTE — Telephone Encounter (Signed)
Called and spoke to pt's wife explaining that we did not have anything available with [name] until his appt that is already scheduled for 8.18.23. I asked if he was ok to see [name], NP and wife stated that she would rather him see [name] because he has had so many problems. I added pt to wait list and advised that we would call if anything comes available, we would reach out.

## 2022-07-02 ENCOUNTER — Other Ambulatory Visit (INDEPENDENT_AMBULATORY_CARE_PROVIDER_SITE_OTHER): Payer: Self-pay | Admitting: Nurse Practitioner

## 2022-07-02 DIAGNOSIS — N186 End stage renal disease: Secondary | ICD-10-CM

## 2022-07-02 DIAGNOSIS — T82898S Other specified complication of vascular prosthetic devices, implants and grafts, sequela: Secondary | ICD-10-CM

## 2022-07-04 ENCOUNTER — Ambulatory Visit (INDEPENDENT_AMBULATORY_CARE_PROVIDER_SITE_OTHER): Payer: Medicare HMO

## 2022-07-04 ENCOUNTER — Encounter (INDEPENDENT_AMBULATORY_CARE_PROVIDER_SITE_OTHER): Payer: Self-pay | Admitting: Nurse Practitioner

## 2022-07-04 ENCOUNTER — Ambulatory Visit (INDEPENDENT_AMBULATORY_CARE_PROVIDER_SITE_OTHER): Payer: Medicare HMO | Admitting: Nurse Practitioner

## 2022-07-04 DIAGNOSIS — T82898S Other specified complication of vascular prosthetic devices, implants and grafts, sequela: Secondary | ICD-10-CM

## 2022-07-04 DIAGNOSIS — N186 End stage renal disease: Secondary | ICD-10-CM

## 2022-07-09 ENCOUNTER — Telehealth (INDEPENDENT_AMBULATORY_CARE_PROVIDER_SITE_OTHER): Payer: Self-pay

## 2022-07-09 NOTE — Telephone Encounter (Signed)
I attempted to contact Paulette per the request to schedule the patient for a left arm fistulagram. Paulette called back and the patient is scheduled on 07/17/22 with a 6:45 am arrival time to the [facility]. Pre-procedure instructions were discussed and will be mailed.

## 2022-07-16 ENCOUNTER — Encounter (INDEPENDENT_AMBULATORY_CARE_PROVIDER_SITE_OTHER): Payer: Self-pay | Admitting: Nurse Practitioner

## 2022-07-16 NOTE — Progress Notes (Signed)
Subjective:    Patient ID: Jeremy Rose, male    DOB: 10-28-1956, 66 y.o.   MRN: [id number] No chief complaint on file.   The patient returns to the office for follow up regarding a problem with their dialysis access.   The patient notes a significant increase in bleeding time after decannulation.  The patient has also been informed that there is increased recirculation.    The patient denies hand pain or other symptoms consistent with steal phenomena.  No significant arm swelling.  The patient denies redness or swelling at the access site. The patient denies fever or chills at home or while on dialysis.  No recent shortening of the patient's walking distance or new symptoms consistent with claudication.  No history of rest pain symptoms. No new ulcers or wounds of the lower extremities have occurred.  The patient denies amaurosis fugax or recent TIA symptoms. There are no recent neurological changes noted. There is no history of DVT, PE or superficial thrombophlebitis. No recent episodes of angina or shortness of breath documented.   Duplex ultrasound of the AV access shows a patent access.  The previously noted stenosis is significantly increased compared to last study.  Flow volume today is 1906 cc/min with a stenotic lesion in the proximal to mid segment.    Review of Systems  All other systems reviewed and are negative.      Objective:   Physical Exam Vitals reviewed.  HENT:     Head: Normocephalic.  Cardiovascular:     Rate and Rhythm: Normal rate.     Arteriovenous access: Left arteriovenous access is present.    Comments: Palpable thrill and bruit Pulmonary:     Effort: Pulmonary effort is normal.  Skin:    General: Skin is warm and dry.  Neurological:     Mental Status: He is alert and oriented to person, place, and time.  Psychiatric:        Mood and Affect: Mood normal.        Behavior: Behavior normal.        Thought Content: Thought content normal.         Judgment: Judgment normal.     BP 112/74 (BP Location: Right Arm)   Pulse 71   Resp 17   Ht 5\' 8"  (1.727 m)   Wt 149 lb (67.6 kg)   BMI 22.66 kg/m   Past Medical History:  Diagnosis Date   Anemia    Arthritis    hands   Asthma    uses inhaler   Coronary artery disease    Dialysis patient [facility]) 2012   Monday, Wednesday, Friday   GERD (gastroesophageal reflux disease)    Gout of right hand    Hypertension    Pneumonia    Renal disorder    stage V..on dialysis   Seizures ([place]) 2019   last seizure 5 years ago. had 20 in one day   Stroke [facility])     Social History   Socioeconomic History   Marital status: Single    Spouse name: Not on file   Number of children: 0   Years of education: Not on file   Highest education level: Not on file  Occupational History   Occupation: disabled  Tobacco Use   Smoking status: Former    Types: Cigarettes   Smokeless tobacco: Former    Quit date: 2010   Tobacco comments:    did not inhale!!  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  Substance and Sexual Activity   Alcohol use: No   Drug use: No   Sexual activity: Not Currently  Other Topics Concern   Not on file  Social History Narrative   Lives by himself with close neighbors who help him    Social Determinants of Health   Financial Resource Strain: Not on file  Food Insecurity: Not on file  Transportation Needs: Not on file  Physical Activity: Not on file  Stress: Not on file  Social Connections: Not on file  Intimate Partner Violence: Not on file    Past Surgical History:  Procedure Laterality Date   A/V FISTULAGRAM Left 02/24/2017   Procedure: A/V Fistulagram;  Surgeon: Katha Cabal, MD;  Location: [place] [facility];  Service: Cardiovascular;  Laterality: Left;   A/V FISTULAGRAM Left 07/20/2019   Procedure: A/V FISTULAGRAM;  Surgeon: Algernon Huxley, MD;  Location: [place] [facility];  Service: Cardiovascular;  Laterality: Left;   A/V FISTULAGRAM Left  04/03/2020   Procedure: A/V FISTULAGRAM;  Surgeon: Algernon Huxley, MD;  Location: [place] [facility];  Service: Cardiovascular;  Laterality: Left;   A/V FISTULAGRAM Left 09/06/2020   Procedure: A/V FISTULAGRAM;  Surgeon: Algernon Huxley, MD;  Location: [place] [facility];  Service: Cardiovascular;  Laterality: Left;   A/V FISTULAGRAM Left 01/17/2021   Procedure: A/V FISTULAGRAM;  Surgeon: Algernon Huxley, MD;  Location: [place] [facility];  Service: Cardiovascular;  Laterality: Left;   A/V FISTULAGRAM Left 08/08/2021   Procedure: A/V FISTULAGRAM;  Surgeon: Algernon Huxley, MD;  Location: [place] [facility];  Service: Cardiovascular;  Laterality: Left;   A/V SHUNT INTERVENTION N/A 02/24/2017   Procedure: A/V Shunt Intervention;  Surgeon: Katha Cabal, MD;  Location: [place] [facility];  Service: Cardiovascular;  Laterality: N/A;   A/V SHUNT INTERVENTION N/A 11/14/2019   Procedure: A/V SHUNT INTERVENTION;  Surgeon: Algernon Huxley, MD;  Location: [place] [facility];  Service: Cardiovascular;  Laterality: N/A;   AV FISTULA PLACEMENT Left    AV FISTULA PLACEMENT Left 12/02/2017   Procedure: ARTERIOVENOUS (AV) FISTULA CREATION ( BRACHIAL CEPHALIC );  Surgeon: Katha Cabal, MD;  Location: [facility] ORS;  Service: Vascular;  Laterality: Left;   CARDIAC CATHETERIZATION  2012   no stents   COLONOSCOPY Left 09/05/2018   Procedure: COLONOSCOPY;  Surgeon: Virgel Manifold, MD;  Location: [facility] ENDOSCOPY;  Service: Endoscopy;  Laterality: Left;   DIALYSIS/PERMA CATHETER REMOVAL N/A 08/24/2018   Procedure: DIALYSIS/PERMA CATHETER REMOVAL;  Surgeon: Katha Cabal, MD;  Location: [place] [facility];  Service: Cardiovascular;  Laterality: N/A;   DIALYSIS/PERMA CATHETER REMOVAL N/A 01/30/2022   Procedure: DIALYSIS/PERMA CATHETER REMOVAL;  Surgeon: Algernon Huxley, MD;  Location: [place] [facility];  Service: Cardiovascular;  Laterality: N/A;   ESOPHAGOGASTRODUODENOSCOPY Left 09/05/2018   Procedure:  ESOPHAGOGASTRODUODENOSCOPY (EGD);  Surgeon: Virgel Manifold, MD;  Location: [facility] ENDOSCOPY;  Service: Endoscopy;  Laterality: Left;   ESOPHAGOGASTRODUODENOSCOPY (EGD) WITH PROPOFOL N/A 08/31/2018   Procedure: ESOPHAGOGASTRODUODENOSCOPY (EGD) WITH PROPOFOL;  Surgeon: Lucilla Lame, MD;  Location: [facility] ENDOSCOPY;  Service: Endoscopy;  Laterality: N/A;   GIVENS CAPSULE STUDY N/A 09/07/2018   Procedure: GIVENS CAPSULE STUDY;  Surgeon: Jonathon Bellows, MD;  Location: [facility] ENDOSCOPY;  Service: Gastroenterology;  Laterality: N/A;   INSERTION OF DIALYSIS CATHETER Left 05/28/2018   Procedure: INSERTION OF DIALYSIS CATHETER ( TUNNELED CATH INSERT );  Surgeon: Katha Cabal, MD;  Location: [facility]S;  Service: Vascular;  Laterality: Left;   INSERTION OF DIALYSIS CATHETER N/A 11/21/2021   Procedure: INSERTION OF DIALYSIS CATHETER (PERMCATH);  Surgeon: Algernon Huxley, MD;  Location: [facility] ORS;  Service: Vascular;  Laterality: N/A;   LIGATION OF ARTERIOVENOUS  FISTULA Left 12/02/2017   Procedure: LIGATION OF ARTERIOVENOUS  FISTULA ( REMOVAL LEFT WRIST FISTULA );  Surgeon: Katha Cabal, MD;  Location: [facility]S;  Service: Vascular;  Laterality: Left;   PERIPHERAL VASCULAR CATHETERIZATION Left 06/03/2016   Procedure: A/V Shuntogram/Fistulagram;  Surgeon: Katha Cabal, MD;  Location: [place] [facility];  Service: Cardiovascular;  Laterality: Left;   PERIPHERAL VASCULAR CATHETERIZATION  06/03/2016   Procedure: Peripheral Vascular Balloon Angioplasty;  Surgeon: Katha Cabal, MD;  Location: [place] [facility];  Service: Cardiovascular;;   PERIPHERAL VASCULAR THROMBECTOMY Left 11/14/2019   Procedure: PERIPHERAL VASCULAR THROMBECTOMY;  Surgeon: Algernon Huxley, MD;  Location: [place] [facility];  Service: Cardiovascular;  Laterality: Left;   REVISON OF ARTERIOVENOUS FISTULA Left 05/28/2018   Procedure: REVISON OF ARTERIOVENOUS FISTULA ( REMOVE ANEURYSM );  Surgeon: Katha Cabal, MD;  Location: [facility]  ORS;  Service: Vascular;  Laterality: Left;   REVISON OF ARTERIOVENOUS FISTULA Left 11/21/2021   Procedure: REVISON OF ARTERIOVENOUS FISTULA (ARTEGRAFT);  Surgeon: Algernon Huxley, MD;  Location: [facility] ORS;  Service: Vascular;  Laterality: Left;   UPPER EXTREMITY VENOGRAPHY N/A 04/13/2018   Procedure: UPPER EXTREMITY VENOGRAPHY;  Surgeon: Katha Cabal, MD;  Location: [place] [facility];  Service: Cardiovascular;  Laterality: N/A;    Family History  Problem Relation Age of Onset   Heart disease Mother    Kidney disease Paternal Uncle     Allergies  Allergen Reactions   Dextromethorphan-Guaifenesin Nausea And Vomiting   Codeine Nausea And Vomiting   Doxycycline Nausea And Vomiting       Latest Ref Rng & Units 11/21/2021   11:20 AM 11/21/2021   11:13 AM 09/29/2018    4:04 AM  CBC  WBC 4.0 - 10.5 K/uL  6.5  9.1   Hemoglobin 13.0 - 17.0 g/dL 13.6  13.3  8.6   Hematocrit 39.0 - 52.0 % 40.0  39.7  26.6   Platelets 150 - 400 K/uL  237  229       CMP     Component Value Date/Time   NA 136 11/21/2021 1120   NA 138 05/18/2014 2142   K 4.2 11/21/2021 1120   K 5.0 06/28/2014 1254   CL 95 (L) 11/21/2021 1120   CL 98 05/18/2014 2142   CO2 32 11/21/2021 1113   CO2 33 (H) 05/18/2014 2142   GLUCOSE 107 (H) 11/21/2021 1120   GLUCOSE 88 05/18/2014 2142   BUN 20 11/21/2021 1120   BUN 12 05/18/2014 2142   CREATININE 7.00 (H) 11/21/2021 1120   CREATININE 5.73 (H) 05/18/2014 2142   CALCIUM 9.4 11/21/2021 1113   CALCIUM 8.3 (L) 05/18/2014 2142   PROT 6.9 09/24/2018 1554   PROT 7.2 07/05/2013 1100   ALBUMIN 3.5 09/24/2018 1554   ALBUMIN 4.1 07/05/2013 1100   AST 17 09/24/2018 1554   AST 20 07/05/2013 1100   ALT 11 09/24/2018 1554   ALT 24 07/05/2013 1100   ALKPHOS 81 09/24/2018 1554   ALKPHOS 102 07/05/2013 1100   BILITOT 0.7 09/24/2018 1554   BILITOT 0.4 07/05/2013 1100   GFRNONAA 9 (L) 11/21/2021 1113   GFRNONAA 10 (L) 05/18/2014 2142   GFRAA 5 (L) 09/29/2018 0404   GFRAA  12  (L) 05/18/2014 2142     No results found.     Assessment & Plan:   1. ESRD (end stage renal disease) ([place]) Recommend:  The patient is experiencing increasing problems with their dialysis access.  Patient should have a fistulagram with the intention for intervention.  The intention for intervention is to restore appropriate flow and prevent thrombosis and possible loss of the access.  As well as improve the quality of dialysis therapy.  The risks, benefits and alternative therapies were reviewed in detail with the patient.  All questions were answered.  The patient agrees to proceed with angio/intervention.    The patient will follow up with me in the office after the procedure.   - VAS US DUPLEX DIALYSIS ACCESS (AVF, AVG)   Current Outpatient Medications on File Prior to Visit  Medication Sig Dispense Refill   acetaminophen (TYLENOL) 500 MG tablet Take 1 tablet (500 mg total) by mouth every 6 (six) hours as needed for mild pain. 180 tablet 0   albuterol (VENTOLIN HFA) 108 (90 Base) MCG/ACT inhaler Inhale 2 puffs into the lungs every 6 (six) hours as needed for wheezing.  (Patient not taking: Reported on 11/21/2021)     allopurinol (ZYLOPRIM) 100 MG tablet Take 200 mg by mouth daily.      ascorbic acid (VITAMIN C) 500 MG tablet Take 500 mg by mouth daily.     aspirin 81 MG EC tablet Take 81 mg by mouth daily.  (Patient not taking: Reported on 08/08/2021)     beclomethasone (QVAR) 80 MCG/ACT inhaler Inhale 1 puff into the lungs as needed (for shortness of breath). (Patient not taking: Reported on 11/21/2021)     carvedilol (COREG CR) 10 MG 24 hr capsule Take 10 mg by mouth daily.      carvedilol (COREG) 25 MG tablet Take 25 mg by mouth.      cinacalcet (SENSIPAR) 60 MG tablet Take 180 mg by mouth daily.      cinacalcet (SENSIPAR) 90 MG tablet Take 180 mg by mouth daily.     cyanocobalamin 1000 MCG tablet Take 1,000 mcg daily by mouth.     felodipine (PLENDIL) 2.5 MG 24 hr tablet Take 2.5  mg by mouth daily.      FLOVENT HFA 110 MCG/ACT inhaler Inhale 2 puffs into the lungs 2 (two) times daily.     HYDROcodone-acetaminophen (NORCO/VICODIN) 5-325 MG tablet Take 2 tablets by mouth every 6 (six) hours as needed for moderate pain. 20 tablet 0   lacosamide (VIMPAT) 50 MG TABS tablet Take by mouth.     Lacosamide 150 MG TABS Take 1 tablet by mouth 2 (two) times daily.     levETIRAcetam (KEPPRA XR) 500 MG 24 hr tablet Take 500 mg by mouth See admin instructions. Take 500 mg twice a day and takes and additional 500 mg on Mon. Wed and Friday after dialysis     levETIRAcetam (KEPPRA) 500 MG tablet Take by mouth.     omeprazole (PRILOSEC) 20 MG capsule Take 20 mg by mouth daily as needed (Heartburn).      QVAR REDIHALER 80 MCG/ACT inhaler Inhale 1 puff into the lungs 2 (two) times daily.     sevelamer carbonate (RENVELA) 800 MG tablet Take 2,400 mg by mouth 3 (three) times daily with meals.      VIMPAT 100 MG TABS Take 100 mg by mouth See admin instructions. Take 100 mg twice a day and extra 100 mg Mon. Wed. and Friday  after dialysys     No current facility-administered medications on file prior to visit.    There are no Patient Instructions on file for this visit. No follow-ups on file.   Kris Hartmann, NP

## 2022-07-16 NOTE — H&P (View-Only) (Signed)
Subjective:    Patient ID: Jeremy Rose, male    DOB: 03/07/1956, 66 y.o.   MRN: [id number] No chief complaint on file.   The patient returns to the office for follow up regarding a problem with their dialysis access.   The patient notes a significant increase in bleeding time after decannulation.  The patient has also been informed that there is increased recirculation.    The patient denies hand pain or other symptoms consistent with steal phenomena.  No significant arm swelling.  The patient denies redness or swelling at the access site. The patient denies fever or chills at home or while on dialysis.  No recent shortening of the patient's walking distance or new symptoms consistent with claudication.  No history of rest pain symptoms. No new ulcers or wounds of the lower extremities have occurred.  The patient denies amaurosis fugax or recent TIA symptoms. There are no recent neurological changes noted. There is no history of DVT, PE or superficial thrombophlebitis. No recent episodes of angina or shortness of breath documented.   Duplex ultrasound of the AV access shows a patent access.  The previously noted stenosis is significantly increased compared to last study.  Flow volume today is 1906 cc/min with a stenotic lesion in the proximal to mid segment.    Review of Systems  All other systems reviewed and are negative.      Objective:   Physical Exam Vitals reviewed.  HENT:     Head: Normocephalic.  Cardiovascular:     Rate and Rhythm: Normal rate.     Arteriovenous access: Left arteriovenous access is present.    Comments: Palpable thrill and bruit Pulmonary:     Effort: Pulmonary effort is normal.  Skin:    General: Skin is warm and dry.  Neurological:     Mental Status: He is alert and oriented to person, place, and time.  Psychiatric:        Mood and Affect: Mood normal.        Behavior: Behavior normal.        Thought Content: Thought content normal.         Judgment: Judgment normal.     BP 112/74 (BP Location: Right Arm)   Pulse 71   Resp 17   Ht 5\' 8"  (1.727 m)   Wt 149 lb (67.6 kg)   BMI 22.66 kg/m   Past Medical History:  Diagnosis Date   Anemia    Arthritis    hands   Asthma    uses inhaler   Coronary artery disease    Dialysis patient [facility]) 2012   Monday, Wednesday, Friday   GERD (gastroesophageal reflux disease)    Gout of right hand    Hypertension    Pneumonia    Renal disorder    stage V..on dialysis   Seizures ([place]) 2019   last seizure 5 years ago. had 20 in one day   Stroke [facility])     Social History   Socioeconomic History   Marital status: Single    Spouse name: Not on file   Number of children: 0   Years of education: Not on file   Highest education level: Not on file  Occupational History   Occupation: disabled  Tobacco Use   Smoking status: Former    Types: Cigarettes   Smokeless tobacco: Former    Quit date: 2010   Tobacco comments:    did not inhale!!  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  Substance and Sexual Activity   Alcohol use: No   Drug use: No   Sexual activity: Not Currently  Other Topics Concern   Not on file  Social History Narrative   Lives by himself with close neighbors who help him    Social Determinants of Health   Financial Resource Strain: Not on file  Food Insecurity: Not on file  Transportation Needs: Not on file  Physical Activity: Not on file  Stress: Not on file  Social Connections: Not on file  Intimate Partner Violence: Not on file    Past Surgical History:  Procedure Laterality Date   A/V FISTULAGRAM Left 02/24/2017   Procedure: A/V Fistulagram;  Surgeon: Katha Cabal, MD;  Location: [place] [facility];  Service: Cardiovascular;  Laterality: Left;   A/V FISTULAGRAM Left 07/20/2019   Procedure: A/V FISTULAGRAM;  Surgeon: Algernon Huxley, MD;  Location: [place] [facility];  Service: Cardiovascular;  Laterality: Left;   A/V FISTULAGRAM Left  04/03/2020   Procedure: A/V FISTULAGRAM;  Surgeon: Algernon Huxley, MD;  Location: [place] [facility];  Service: Cardiovascular;  Laterality: Left;   A/V FISTULAGRAM Left 09/06/2020   Procedure: A/V FISTULAGRAM;  Surgeon: Algernon Huxley, MD;  Location: [place] [facility];  Service: Cardiovascular;  Laterality: Left;   A/V FISTULAGRAM Left 01/17/2021   Procedure: A/V FISTULAGRAM;  Surgeon: Algernon Huxley, MD;  Location: [place] [facility];  Service: Cardiovascular;  Laterality: Left;   A/V FISTULAGRAM Left 08/08/2021   Procedure: A/V FISTULAGRAM;  Surgeon: Algernon Huxley, MD;  Location: [place] [facility];  Service: Cardiovascular;  Laterality: Left;   A/V SHUNT INTERVENTION N/A 02/24/2017   Procedure: A/V Shunt Intervention;  Surgeon: Katha Cabal, MD;  Location: [place] [facility];  Service: Cardiovascular;  Laterality: N/A;   A/V SHUNT INTERVENTION N/A 11/14/2019   Procedure: A/V SHUNT INTERVENTION;  Surgeon: Algernon Huxley, MD;  Location: [place] [facility];  Service: Cardiovascular;  Laterality: N/A;   AV FISTULA PLACEMENT Left    AV FISTULA PLACEMENT Left 12/02/2017   Procedure: ARTERIOVENOUS (AV) FISTULA CREATION ( BRACHIAL CEPHALIC );  Surgeon: Katha Cabal, MD;  Location: [facility] ORS;  Service: Vascular;  Laterality: Left;   CARDIAC CATHETERIZATION  2012   no stents   COLONOSCOPY Left 09/05/2018   Procedure: COLONOSCOPY;  Surgeon: Virgel Manifold, MD;  Location: [facility] ENDOSCOPY;  Service: Endoscopy;  Laterality: Left;   DIALYSIS/PERMA CATHETER REMOVAL N/A 08/24/2018   Procedure: DIALYSIS/PERMA CATHETER REMOVAL;  Surgeon: Katha Cabal, MD;  Location: [place] [facility];  Service: Cardiovascular;  Laterality: N/A;   DIALYSIS/PERMA CATHETER REMOVAL N/A 01/30/2022   Procedure: DIALYSIS/PERMA CATHETER REMOVAL;  Surgeon: Algernon Huxley, MD;  Location: [place] [facility];  Service: Cardiovascular;  Laterality: N/A;   ESOPHAGOGASTRODUODENOSCOPY Left 09/05/2018   Procedure:  ESOPHAGOGASTRODUODENOSCOPY (EGD);  Surgeon: Virgel Manifold, MD;  Location: [facility] ENDOSCOPY;  Service: Endoscopy;  Laterality: Left;   ESOPHAGOGASTRODUODENOSCOPY (EGD) WITH PROPOFOL N/A 08/31/2018   Procedure: ESOPHAGOGASTRODUODENOSCOPY (EGD) WITH PROPOFOL;  Surgeon: Lucilla Lame, MD;  Location: [facility] ENDOSCOPY;  Service: Endoscopy;  Laterality: N/A;   GIVENS CAPSULE STUDY N/A 09/07/2018   Procedure: GIVENS CAPSULE STUDY;  Surgeon: Jonathon Bellows, MD;  Location: [facility] ENDOSCOPY;  Service: Gastroenterology;  Laterality: N/A;   INSERTION OF DIALYSIS CATHETER Left 05/28/2018   Procedure: INSERTION OF DIALYSIS CATHETER ( TUNNELED CATH INSERT );  Surgeon: Katha Cabal, MD;  Location: [facility] ORS;  Service: Vascular;  Laterality: Left;   INSERTION OF DIALYSIS CATHETER N/A 11/21/2021   Procedure: INSERTION OF DIALYSIS CATHETER (PERMCATH);  Surgeon: Algernon Huxley, MD;  Location: [facility] ORS;  Service: Vascular;  Laterality: N/A;   LIGATION OF ARTERIOVENOUS  FISTULA Left 12/02/2017   Procedure: LIGATION OF ARTERIOVENOUS  FISTULA ( REMOVAL LEFT WRIST FISTULA );  Surgeon: Katha Cabal, MD;  Location: [facility] ORS;  Service: Vascular;  Laterality: Left;   PERIPHERAL VASCULAR CATHETERIZATION Left 06/03/2016   Procedure: A/V Shuntogram/Fistulagram;  Surgeon: Katha Cabal, MD;  Location: [place] [facility];  Service: Cardiovascular;  Laterality: Left;   PERIPHERAL VASCULAR CATHETERIZATION  06/03/2016   Procedure: Peripheral Vascular Balloon Angioplasty;  Surgeon: Katha Cabal, MD;  Location: [place] [facility];  Service: Cardiovascular;;   PERIPHERAL VASCULAR THROMBECTOMY Left 11/14/2019   Procedure: PERIPHERAL VASCULAR THROMBECTOMY;  Surgeon: Algernon Huxley, MD;  Location: [place] [facility];  Service: Cardiovascular;  Laterality: Left;   REVISON OF ARTERIOVENOUS FISTULA Left 05/28/2018   Procedure: REVISON OF ARTERIOVENOUS FISTULA ( REMOVE ANEURYSM );  Surgeon: Katha Cabal, MD;  Location: [facility]  ORS;  Service: Vascular;  Laterality: Left;   REVISON OF ARTERIOVENOUS FISTULA Left 11/21/2021   Procedure: REVISON OF ARTERIOVENOUS FISTULA (ARTEGRAFT);  Surgeon: Algernon Huxley, MD;  Location: [facility] ORS;  Service: Vascular;  Laterality: Left;   UPPER EXTREMITY VENOGRAPHY N/A 04/13/2018   Procedure: UPPER EXTREMITY VENOGRAPHY;  Surgeon: Katha Cabal, MD;  Location: [place] [facility];  Service: Cardiovascular;  Laterality: N/A;    Family History  Problem Relation Age of Onset   Heart disease Mother    Kidney disease Paternal Uncle     Allergies  Allergen Reactions   Dextromethorphan-Guaifenesin Nausea And Vomiting   Codeine Nausea And Vomiting   Doxycycline Nausea And Vomiting       Latest Ref Rng & Units 11/21/2021   11:20 AM 11/21/2021   11:13 AM 09/29/2018    4:04 AM  CBC  WBC 4.0 - 10.5 K/uL  6.5  9.1   Hemoglobin 13.0 - 17.0 g/dL 13.6  13.3  8.6   Hematocrit 39.0 - 52.0 % 40.0  39.7  26.6   Platelets 150 - 400 K/uL  237  229       CMP     Component Value Date/Time   NA 136 11/21/2021 1120   NA 138 05/18/2014 2142   K 4.2 11/21/2021 1120   K 5.0 06/28/2014 1254   CL 95 (L) 11/21/2021 1120   CL 98 05/18/2014 2142   CO2 32 11/21/2021 1113   CO2 33 (H) 05/18/2014 2142   GLUCOSE 107 (H) 11/21/2021 1120   GLUCOSE 88 05/18/2014 2142   BUN 20 11/21/2021 1120   BUN 12 05/18/2014 2142   CREATININE 7.00 (H) 11/21/2021 1120   CREATININE 5.73 (H) 05/18/2014 2142   CALCIUM 9.4 11/21/2021 1113   CALCIUM 8.3 (L) 05/18/2014 2142   PROT 6.9 09/24/2018 1554   PROT 7.2 07/05/2013 1100   ALBUMIN 3.5 09/24/2018 1554   ALBUMIN 4.1 07/05/2013 1100   AST 17 09/24/2018 1554   AST 20 07/05/2013 1100   ALT 11 09/24/2018 1554   ALT 24 07/05/2013 1100   ALKPHOS 81 09/24/2018 1554   ALKPHOS 102 07/05/2013 1100   BILITOT 0.7 09/24/2018 1554   BILITOT 0.4 07/05/2013 1100   GFRNONAA 9 (L) 11/21/2021 1113   GFRNONAA 10 (L) 05/18/2014 2142   GFRAA 5 (L) 09/29/2018 0404   GFRAA  12  (L) 05/18/2014 2142     No results found.     Assessment & Plan:   1. ESRD (end stage renal disease) ([place]) Recommend:  The patient is experiencing increasing problems with their dialysis access.  Patient should have a fistulagram with the intention for intervention.  The intention for intervention is to restore appropriate flow and prevent thrombosis and possible loss of the access.  As well as improve the quality of dialysis therapy.  The risks, benefits and alternative therapies were reviewed in detail with the patient.  All questions were answered.  The patient agrees to proceed with angio/intervention.    The patient will follow up with me in the office after the procedure.   - VAS US DUPLEX DIALYSIS ACCESS (AVF, AVG)   Current Outpatient Medications on File Prior to Visit  Medication Sig Dispense Refill   acetaminophen (TYLENOL) 500 MG tablet Take 1 tablet (500 mg total) by mouth every 6 (six) hours as needed for mild pain. 180 tablet 0   albuterol (VENTOLIN HFA) 108 (90 Base) MCG/ACT inhaler Inhale 2 puffs into the lungs every 6 (six) hours as needed for wheezing.  (Patient not taking: Reported on 11/21/2021)     allopurinol (ZYLOPRIM) 100 MG tablet Take 200 mg by mouth daily.      ascorbic acid (VITAMIN C) 500 MG tablet Take 500 mg by mouth daily.     aspirin 81 MG EC tablet Take 81 mg by mouth daily.  (Patient not taking: Reported on 08/08/2021)     beclomethasone (QVAR) 80 MCG/ACT inhaler Inhale 1 puff into the lungs as needed (for shortness of breath). (Patient not taking: Reported on 11/21/2021)     carvedilol (COREG CR) 10 MG 24 hr capsule Take 10 mg by mouth daily.      carvedilol (COREG) 25 MG tablet Take 25 mg by mouth.      cinacalcet (SENSIPAR) 60 MG tablet Take 180 mg by mouth daily.      cinacalcet (SENSIPAR) 90 MG tablet Take 180 mg by mouth daily.     cyanocobalamin 1000 MCG tablet Take 1,000 mcg daily by mouth.     felodipine (PLENDIL) 2.5 MG 24 hr tablet Take 2.5  mg by mouth daily.      FLOVENT HFA 110 MCG/ACT inhaler Inhale 2 puffs into the lungs 2 (two) times daily.     HYDROcodone-acetaminophen (NORCO/VICODIN) 5-325 MG tablet Take 2 tablets by mouth every 6 (six) hours as needed for moderate pain. 20 tablet 0   lacosamide (VIMPAT) 50 MG TABS tablet Take by mouth.     Lacosamide 150 MG TABS Take 1 tablet by mouth 2 (two) times daily.     levETIRAcetam (KEPPRA XR) 500 MG 24 hr tablet Take 500 mg by mouth See admin instructions. Take 500 mg twice a day and takes and additional 500 mg on Mon. Wed and Friday after dialysis     levETIRAcetam (KEPPRA) 500 MG tablet Take by mouth.     omeprazole (PRILOSEC) 20 MG capsule Take 20 mg by mouth daily as needed (Heartburn).      QVAR REDIHALER 80 MCG/ACT inhaler Inhale 1 puff into the lungs 2 (two) times daily.     sevelamer carbonate (RENVELA) 800 MG tablet Take 2,400 mg by mouth 3 (three) times daily with meals.      VIMPAT 100 MG TABS Take 100 mg by mouth See admin instructions. Take 100 mg twice a day and extra 100 mg Mon. Wed. and Friday  after dialysys     No current facility-administered medications on file prior to visit.    There are no Patient Instructions on file for this visit. No follow-ups on file.   Kris Hartmann, NP

## 2022-07-17 ENCOUNTER — Encounter: Payer: Self-pay | Admitting: Vascular Surgery

## 2022-07-17 ENCOUNTER — Other Ambulatory Visit: Payer: Self-pay

## 2022-07-17 ENCOUNTER — Ambulatory Visit
Admission: RE | Admit: 2022-07-17 | Discharge: 2022-07-17 | Disposition: A | Discharge status: Home / Self Care | Payer: Medicare HMO | Attending: Vascular Surgery | Admitting: Vascular Surgery

## 2022-07-17 ENCOUNTER — Encounter
Admission: RE | Disposition: A | Discharge status: Home / Self Care | Payer: Self-pay | Source: Home / Self Care | Attending: Vascular Surgery

## 2022-07-17 DIAGNOSIS — Z992 Dependence on renal dialysis: Secondary | ICD-10-CM | POA: Diagnosis not present

## 2022-07-17 DIAGNOSIS — N186 End stage renal disease: Secondary | ICD-10-CM

## 2022-07-17 DIAGNOSIS — Y841 Kidney dialysis as the cause of abnormal reaction of the patient, or of later complication, without mention of misadventure at the time of the procedure: Secondary | ICD-10-CM | POA: Insufficient documentation

## 2022-07-17 DIAGNOSIS — T82858A Stenosis of vascular prosthetic devices, implants and grafts, initial encounter: Secondary | ICD-10-CM | POA: Insufficient documentation

## 2022-07-17 DIAGNOSIS — Z9889 Other specified postprocedural states: Secondary | ICD-10-CM

## 2022-07-17 HISTORY — PX: A/V FISTULAGRAM: CATH118298

## 2022-07-17 LAB — POTASSIUM (ARMC VASCULAR LAB ONLY): Potassium (ARMC vascular lab): 4.1 mmol/L (ref 3.5–5.1)

## 2022-07-17 SURGERY — A/V FISTULAGRAM
Anesthesia: Moderate Sedation | Laterality: Left

## 2022-07-17 MED ORDER — HEPARIN SODIUM (PORCINE) 1000 UNIT/ML IJ SOLN
INTRAMUSCULAR | Status: DC | PRN
  Administered 2022-07-17: 3000 [IU] via INTRAVENOUS

## 2022-07-17 MED ORDER — CEFAZOLIN SODIUM-DEXTROSE 2-4 GM/100ML-% IV SOLN: 2.0000 g | INTRAVENOUS | Status: DC

## 2022-07-17 MED ORDER — FENTANYL CITRATE (PF) 100 MCG/2ML IJ SOLN
INTRAMUSCULAR | Status: DC | PRN
  Administered 2022-07-17: 50 ug via INTRAVENOUS

## 2022-07-17 MED ORDER — CEFAZOLIN SODIUM-DEXTROSE 1-4 GM/50ML-% IV SOLN
1.0000 g | INTRAVENOUS | Status: AC
  Administered 2022-07-17: 1 g via INTRAVENOUS

## 2022-07-17 MED ORDER — MIDAZOLAM HCL 2 MG/ML PO SYRP: 8.0000 mg | ORAL_SOLUTION | Freq: Once | ORAL | Status: DC | PRN

## 2022-07-17 MED ORDER — METHYLPREDNISOLONE SODIUM SUCC 125 MG IJ SOLR: 125.0000 mg | Freq: Once | INTRAMUSCULAR | Status: DC | PRN

## 2022-07-17 MED ORDER — FENTANYL CITRATE (PF) 100 MCG/2ML IJ SOLN
INTRAMUSCULAR | Status: AC
  Filled 2022-07-17: qty 2

## 2022-07-17 MED ORDER — SODIUM CHLORIDE 0.9 % IV SOLN: INTRAVENOUS | Status: DC

## 2022-07-17 MED ORDER — ONDANSETRON HCL 4 MG/2ML IJ SOLN: 4.0000 mg | Freq: Four times a day (QID) | INTRAMUSCULAR | Status: DC | PRN

## 2022-07-17 MED ORDER — FAMOTIDINE 20 MG PO TABS: 40.0000 mg | ORAL_TABLET | Freq: Once | ORAL | Status: DC | PRN

## 2022-07-17 MED ORDER — IODIXANOL 320 MG/ML IV SOLN
INTRAVENOUS | Status: DC | PRN
  Administered 2022-07-17: 20 mL via INTRAVENOUS

## 2022-07-17 MED ORDER — HEPARIN SODIUM (PORCINE) 1000 UNIT/ML IJ SOLN
INTRAMUSCULAR | Status: AC
  Filled 2022-07-17: qty 10

## 2022-07-17 MED ORDER — DIPHENHYDRAMINE HCL 50 MG/ML IJ SOLN: 50.0000 mg | Freq: Once | INTRAMUSCULAR | Status: DC | PRN

## 2022-07-17 MED ORDER — HYDROMORPHONE HCL 1 MG/ML IJ SOLN: 1.0000 mg | Freq: Once | INTRAMUSCULAR | Status: DC | PRN

## 2022-07-17 MED ORDER — MIDAZOLAM HCL 2 MG/2ML IJ SOLN
INTRAMUSCULAR | Status: DC
Start: 2022-07-17 — End: 2022-07-17
  Filled 2022-07-17: qty 2

## 2022-07-17 MED ORDER — MIDAZOLAM HCL 2 MG/2ML IJ SOLN
INTRAMUSCULAR | Status: DC | PRN
  Administered 2022-07-17: 2 mg via INTRAVENOUS

## 2022-07-17 SURGICAL SUPPLY — 10 items
BALLN LUTONIX 7X80X130 (BALLOONS) ×1
BALLOON LUTONIX 7X80X130 (BALLOONS) IMPLANT
DRAPE BRACHIAL (DRAPES) IMPLANT
KIT ENCORE 26 ADVANTAGE (KITS) IMPLANT
KIT MICROPUNCTURE NIT STIFF (SHEATH) IMPLANT
PACK ANGIOGRAPHY (CUSTOM PROCEDURE TRAY) ×1 IMPLANT
SHEATH BRITE TIP 6FRX5.5 (SHEATH) IMPLANT
SHEATH PROBE COVER 6X72 (BAG) IMPLANT
SUT MNCRL AB 4-0 PS2 18 (SUTURE) IMPLANT
WIRE MAGIC TOR.035 180C (WIRE) IMPLANT

## 2022-07-17 NOTE — Interval H&P Note (Signed)
History and Physical Interval Note:  07/17/2022 8:04 AM  Jeremy Rose  has presented today for surgery, with the diagnosis of LT Arm Fistulagram   ESRD.  The various methods of treatment have been discussed with the patient and family. After consideration of risks, benefits and other options for treatment, the patient has consented to  Procedure(s): A/V Fistulagram (Left) as a surgical intervention.  The patient's history has been reviewed, patient examined, no change in status, stable for surgery.  I have reviewed the patient's chart and labs.  Questions were answered to the patient's satisfaction.     [name]

## 2022-07-17 NOTE — Op Note (Signed)
[facility]    OPERATIVE NOTE   PROCEDURE: 1.   Left brachiocephalic arteriovenous fistula cannulation under ultrasound guidance 2.   Left arm fistulagram including central venogram 3.   Percutaneous transluminal angioplasty of the jump graft as well as the jump graft to proximal arm cephalic vein anastomosis with 7 mm diameter Lutonix drug-coated angioplasty balloon  PRE-OPERATIVE DIAGNOSIS: 1. ESRD 2. 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  There is also stenosis in the perianastomotic area in the proximal upper arm cephalic vein to the jump graft in the 70% range.  The remainder of the fistula and jump graft were patent and the central venous circulation was patent.  SPECIMEN(S):  None  CONTRAST: 20 cc  FLUORO TIME: 1.4 minutes  MODERATE CONSCIOUS SEDATION TIME: Approximately 18 minutes with 2 mg of Versed and 50 mcg of Fentanyl   INDICATIONS: Jeremy Rose is a 66 y.o. male who presents with malfunctioning left brachiocephalic arteriovenous fistula.  This has already had a surgical jump graft revision.  The patient is scheduled for left arm fistulagram.  The patient is aware the risks include but are not limited to: bleeding, infection, thrombosis of the cannulated access, and possible anaphylactic reaction to the contrast.  The patient is aware of the risks of the procedure and elects to proceed forward.  DESCRIPTION: After full informed written consent was obtained, the patient was brought back to the angiography suite and placed supine upon the angiography table.  The patient was connected to monitoring equipment. Moderate conscious sedation was administered with a face to face encounter  with the patient throughout the procedure with my supervision of the RN administering medicines and monitoring the patient's vital signs and mental status throughout from the start of the procedure until the patient was taken to the recovery room. The left arm was prepped and draped in the standard fashion for a percutaneous access intervention.  Under ultrasound guidance, the left brachiocephalic arteriovenous fistula was cannulated with a micropuncture needle under direct ultrasound guidance where it was patent and a permanent image was performed.  The microwire was advanced into the fistula and the needle was exchanged for the a microsheath.  I then upsized to a 6 Fr Sheath and imaging was performed.  Hand injections were completed to image the access including the central venous system. This demonstrated stenosis at the access sites in the 75 to 80% range in the midportion of the jump graft.  There is also stenosis in the perianastomotic area in the proximal upper arm cephalic vein to the jump graft in the 70% range.  The remainder of the fistula and jump graft were patent and the central venous circulation was patent..  Based on the images, this patient will need intervention to both these areas of stenosis. I then gave the patient 3000 units of intravenous heparin.  I then crossed the stenoses with a Magic Tourqe wire.  Based on the imaging, a 7 mm x 8 cm Lutonix drug-coated angioplasty balloon was selected.  The balloon was centered around the more proximal stenosis near the anastomosis the proximal arm cephalic vein to the jump graft and inflated to 8 ATM for 1 minute(s).  The balloon was then  pulled back to the mid jump graft stenosis and inflated to 10 atm for 1 minute to treat the area and the more distal upper arm.  On completion imaging, a 15-20% residual stenosis was present in both locations.     Based on the completion imaging, no further intervention is necessary.  The wire and balloon were  removed from the sheath.  A 4-0 Monocryl purse-string suture was sewn around the sheath.  The sheath was removed while tying down the suture.  A sterile bandage was applied to the puncture site.  COMPLICATIONS: None  CONDITION: Stable   [name]  07/17/2022 8:27 AM   This note was created with Dragon Medical transcription system. Any errors in dictation are purely unintentional.

## 2022-08-27 ENCOUNTER — Other Ambulatory Visit (INDEPENDENT_AMBULATORY_CARE_PROVIDER_SITE_OTHER): Payer: Self-pay | Admitting: Vascular Surgery

## 2022-08-27 DIAGNOSIS — Z9862 Peripheral vascular angioplasty status: Secondary | ICD-10-CM

## 2022-08-27 DIAGNOSIS — N186 End stage renal disease: Secondary | ICD-10-CM

## 2022-08-28 ENCOUNTER — Ambulatory Visit (INDEPENDENT_AMBULATORY_CARE_PROVIDER_SITE_OTHER): Payer: Medicare HMO | Admitting: Nurse Practitioner

## 2022-08-28 ENCOUNTER — Ambulatory Visit (INDEPENDENT_AMBULATORY_CARE_PROVIDER_SITE_OTHER): Payer: Medicare HMO

## 2022-08-28 ENCOUNTER — Encounter (INDEPENDENT_AMBULATORY_CARE_PROVIDER_SITE_OTHER): Payer: Self-pay | Admitting: Nurse Practitioner

## 2022-08-28 VITALS — BP 94/57 | HR 78 | Resp 19 | Ht 68.0 in | Wt 145.8 lb

## 2022-08-28 DIAGNOSIS — I10 Essential (primary) hypertension: Secondary | ICD-10-CM | POA: Diagnosis not present

## 2022-08-28 DIAGNOSIS — N186 End stage renal disease: Secondary | ICD-10-CM

## 2022-08-28 DIAGNOSIS — Z9862 Peripheral vascular angioplasty status: Secondary | ICD-10-CM

## 2022-08-31 ENCOUNTER — Encounter (INDEPENDENT_AMBULATORY_CARE_PROVIDER_SITE_OTHER): Payer: Self-pay | Admitting: Nurse Practitioner

## 2022-08-31 NOTE — Progress Notes (Signed)
Subjective:    Patient ID: Jeremy Rose, male    DOB: 1956/08/21, 66 y.o.   MRN: [id number] No chief complaint on file.   The patient returns to the office for followup status post intervention of their dialysis access 07/17/2022.   Following the intervention the access function has significantly improved, with better flow rates and improved KT/V. The patient has not been experiencing increased bleeding times following decannulation and the patient denies increased recirculation. The patient denies an increase in arm swelling. At the present time the patient denies hand pain.  No recent shortening of the patient's walking distance or new symptoms consistent with claudication.  No history of rest pain symptoms. No new ulcers or wounds of the lower extremities have occurred.  The patient denies amaurosis fugax or recent TIA symptoms. There are no recent neurological changes noted. There is no history of DVT, PE or superficial thrombophlebitis. No recent episodes of angina or shortness of breath documented.   Duplex ultrasound of the AV access shows a patent access.  The previously noted stenosis is improved compared to last study.  Flow volume today is 2008 cc/min (previous flow volume was 1906 cc/min)        Review of Systems  All other systems reviewed and are negative.      Objective:   Physical Exam Vitals reviewed.  HENT:     Head: Normocephalic.  Cardiovascular:     Rate and Rhythm: Normal rate.     Pulses:          Radial pulses are 1+ on the right side and 1+ on the left side.     Arteriovenous access: Left arteriovenous access is present.    Comments: Good thrill and bruit Pulmonary:     Effort: Pulmonary effort is normal.  Skin:    General: Skin is warm and dry.  Neurological:     Mental Status: He is alert and oriented to person, place, and time.  Psychiatric:        Mood and Affect: Mood normal.        Behavior: Behavior normal.        Thought  Content: Thought content normal.        Judgment: Judgment normal.     BP (!) 94/57 (BP Location: Right Arm)   Pulse 78   Resp 19   Ht 5\' 8"  (1.727 m)   Wt 145 lb 12.8 oz (66.1 kg)   BMI 22.17 kg/m   Past Medical History:  Diagnosis Date   Anemia    Arthritis    hands   Asthma    uses inhaler   Coronary artery disease    Dialysis patient [facility]) 2012   Monday, Wednesday, Friday   GERD (gastroesophageal reflux disease)    Gout of right hand    Hypertension    Pneumonia    Renal disorder    stage V..on dialysis   Seizures ([place]) 2019   last seizure 5 years ago. had 20 in one day   Stroke [facility])     Social History   Socioeconomic History   Marital status: Single    Spouse name: Not on file   Number of children: 0   Years of education: Not on file   Highest education level: Not on file  Occupational History   Occupation: disabled  Tobacco Use   Smoking status: Former    Types: Cigarettes   Smokeless tobacco: Former    Quit date: 2010  Tobacco comments:    did not inhale!!  Vaping Use   Vaping Use: Never used  Substance and Sexual Activity   Alcohol use: No   Drug use: No   Sexual activity: Not Currently  Other Topics Concern   Not on file  Social History Narrative   Lives by himself with close neighbors who help him    Social Determinants of Health   Financial Resource Strain: Not on file  Food Insecurity: Not on file  Transportation Needs: Not on file  Physical Activity: Not on file  Stress: Not on file  Social Connections: Not on file  Intimate Partner Violence: Not on file    Past Surgical History:  Procedure Laterality Date   A/V FISTULAGRAM Left 02/24/2017   Procedure: A/V Fistulagram;  Surgeon: Katha Cabal, MD;  Location: [place] CV LAB;  Service: Cardiovascular;  Laterality: Left;   A/V FISTULAGRAM Left 07/20/2019   Procedure: A/V FISTULAGRAM;  Surgeon: Algernon Huxley, MD;  Location: [place] CV LAB;  Service: Cardiovascular;   Laterality: Left;   A/V FISTULAGRAM Left 04/03/2020   Procedure: A/V FISTULAGRAM;  Surgeon: Algernon Huxley, MD;  Location: [place] CV LAB;  Service: Cardiovascular;  Laterality: Left;   A/V FISTULAGRAM Left 09/06/2020   Procedure: A/V FISTULAGRAM;  Surgeon: Algernon Huxley, MD;  Location: [place] CV LAB;  Service: Cardiovascular;  Laterality: Left;   A/V FISTULAGRAM Left 01/17/2021   Procedure: A/V FISTULAGRAM;  Surgeon: Algernon Huxley, MD;  Location: [place] CV LAB;  Service: Cardiovascular;  Laterality: Left;   A/V FISTULAGRAM Left 08/08/2021   Procedure: A/V FISTULAGRAM;  Surgeon: Algernon Huxley, MD;  Location: [place] CV LAB;  Service: Cardiovascular;  Laterality: Left;   A/V FISTULAGRAM Left 07/17/2022   Procedure: A/V Fistulagram;  Surgeon: Algernon Huxley, MD;  Location: [place] CV LAB;  Service: Cardiovascular;  Laterality: Left;   A/V SHUNT INTERVENTION N/A 02/24/2017   Procedure: A/V Shunt Intervention;  Surgeon: Katha Cabal, MD;  Location: [place] CV LAB;  Service: Cardiovascular;  Laterality: N/A;   A/V SHUNT INTERVENTION N/A 11/14/2019   Procedure: A/V SHUNT INTERVENTION;  Surgeon: Algernon Huxley, MD;  Location: [place] [facility] LAB;  Service: Cardiovascular;  Laterality: N/A;   AV FISTULA PLACEMENT Left    AV FISTULA PLACEMENT Left 12/02/2017   Procedure: ARTERIOVENOUS (AV) FISTULA CREATION ( BRACHIAL CEPHALIC );  Surgeon: Katha Cabal, MD;  Location: [facility];  Service: Vascular;  Laterality: Left;   CARDIAC CATHETERIZATION  2012   no stents   COLONOSCOPY Left 09/05/2018   Procedure: COLONOSCOPY;  Surgeon: Virgel Manifold, MD;  [facility];  Service: Endoscopy;  Laterality: Left;   DIALYSIS/PERMA CATHETER REMOVAL N/A 08/24/2018   Procedure: DIALYSIS/PERMA CATHETER REMOVAL;  Surgeon: Katha Cabal, MD;  Location: [place] CV LAB;  Service: Cardiovascular;  Laterality: N/A;   DIALYSIS/PERMA CATHETER REMOVAL N/A 01/30/2022    Procedure: DIALYSIS/PERMA CATHETER REMOVAL;  Surgeon: Algernon Huxley, MD;  Location: [place] CV LAB;  Service: Cardiovascular;  Laterality: N/A;   ESOPHAGOGASTRODUODENOSCOPY Left 09/05/2018   Procedure: ESOPHAGOGASTRODUODENOSCOPY (EGD);  Surgeon: Virgel Manifold, MD;  Location: [facility] ENDOSCOPY;  Service: Endoscopy;  Laterality: Left;   ESOPHAGOGASTRODUODENOSCOPY (EGD) WITH PROPOFOL N/A 08/31/2018   Procedure: ESOPHAGOGASTRODUODENOSCOPY (EGD) WITH PROPOFOL;  Surgeon: Lucilla Lame, MD;  Location: [facility] ENDOSCOPY;  Service: Endoscopy;  Laterality: N/A;   GIVENS CAPSULE STUDY N/A 09/07/2018   Procedure: GIVENS CAPSULE STUDY;  Surgeon: Vicente Males,  Bailey Mech, MD;  Location: [place];  Service: Gastroenterology;  Laterality: N/A;   INSERTION OF DIALYSIS CATHETER Left 05/28/2018   Procedure: INSERTION OF DIALYSIS CATHETER ( TUNNELED CATH INSERT );  Surgeon: Katha Cabal, MD;  Location: [facility];  Service: Vascular;  Laterality: Left;   INSERTION OF DIALYSIS CATHETER N/A 11/21/2021   Procedure: INSERTION OF DIALYSIS CATHETER (PERMCATH);  Surgeon: Algernon Huxley, MD;  Location: [facility];  Service: Vascular;  Laterality: N/A;   LIGATION OF ARTERIOVENOUS  FISTULA Left 12/02/2017   Procedure: LIGATION OF ARTERIOVENOUS  FISTULA ( REMOVAL LEFT WRIST FISTULA );  Surgeon: Katha Cabal, MD;  Location: [facility];  Service: Vascular;  Laterality: Left;   PERIPHERAL VASCULAR CATHETERIZATION Left 06/03/2016   Procedure: A/V Shuntogram/Fistulagram;  Surgeon: Katha Cabal, MD;  Location: [place] CV LAB;  Service: Cardiovascular;  Laterality: Left;   PERIPHERAL VASCULAR CATHETERIZATION  06/03/2016   Procedure: Peripheral Vascular Balloon Angioplasty;  Surgeon: Katha Cabal, MD;  Location: [place] CV LAB;  Service: Cardiovascular;;   PERIPHERAL VASCULAR THROMBECTOMY Left 11/14/2019   Procedure: PERIPHERAL VASCULAR THROMBECTOMY;  Surgeon: Algernon Huxley, MD;  Location: [place] CV LAB;   Service: Cardiovascular;  Laterality: Left;   REVISON OF ARTERIOVENOUS FISTULA Left 05/28/2018   Procedure: REVISON OF ARTERIOVENOUS FISTULA ( REMOVE ANEURYSM );  Surgeon: Katha Cabal, MD;  Location: [facility];  Service: Vascular;  Laterality: Left;   REVISON OF ARTERIOVENOUS FISTULA Left 11/21/2021   Procedure: REVISON OF ARTERIOVENOUS FISTULA (ARTEGRAFT);  Surgeon: Algernon Huxley, MD;  Location: [facility];  Service: Vascular;  Laterality: Left;   UPPER EXTREMITY VENOGRAPHY N/A 04/13/2018   Procedure: UPPER EXTREMITY VENOGRAPHY;  Surgeon: Katha Cabal, MD;  Location: [place] [facility];  Service: Cardiovascular;  Laterality: N/A;    Family History  Problem Relation Age of Onset   Heart disease Mother    Kidney disease Paternal Uncle     Allergies  Allergen Reactions   Dextromethorphan-Guaifenesin Nausea And Vomiting   Codeine Nausea And Vomiting   Doxycycline Nausea And Vomiting       Latest Ref Rng & Units 11/21/2021   11:20 AM 11/21/2021   11:13 AM 09/29/2018    4:04 AM  CBC  WBC 4.0 - 10.5 K/uL  6.5  9.1   Hemoglobin 13.0 - 17.0 g/dL 13.6  13.3  8.6   Hematocrit 39.0 - 52.0 % 40.0  39.7  26.6   Platelets 150 - 400 K/uL  237  229       CMP     Component Value Date/Time   NA 136 11/21/2021 1120   NA 138 05/18/2014 2142   K 4.2 11/21/2021 1120   K 5.0 06/28/2014 1254   CL 95 (L) 11/21/2021 1120   CL 98 05/18/2014 2142   CO2 32 11/21/2021 1113   CO2 33 (H) 05/18/2014 2142   GLUCOSE 107 (H) 11/21/2021 1120   GLUCOSE 88 05/18/2014 2142   BUN 20 11/21/2021 1120   BUN 12 05/18/2014 2142   CREATININE 7.00 (H) 11/21/2021 1120   CREATININE 5.73 (H) 05/18/2014 2142   CALCIUM 9.4 11/21/2021 1113   CALCIUM 8.3 (L) 05/18/2014 2142   PROT 6.9 09/24/2018 1554   PROT 7.2 07/05/2013 1100   ALBUMIN 3.5 09/24/2018 1554   ALBUMIN 4.1 07/05/2013 1100   AST 17 09/24/2018 1554   AST 20 07/05/2013 1100   ALT 11 09/24/2018 1554   ALT 24 07/05/2013 1100   ALKPHOS 81  09/24/2018 1554   ALKPHOS 102 07/05/2013 1100   BILITOT 0.7 09/24/2018 1554   BILITOT 0.4 07/05/2013 1100   GFRNONAA 9 (L) 11/21/2021 1113   GFRNONAA 10 (L) 05/18/2014 2142   GFRAA 5 (L) 09/29/2018 0404   GFRAA 12 (L) 05/18/2014 2142     No results found.     Assessment & Plan:   1. ESRD (end stage renal disease) ([place]) Recommend:  The patient is doing well and currently has adequate dialysis access. The patient's dialysis center is not reporting any access issues. Flow pattern is stable when compared to the prior ultrasound.  The patient should have a duplex ultrasound of the dialysis access in 6 months. The patient will follow-up with me in the office after each ultrasound    - VAS [place] (AVF, AVG)  2. Primary hypertension Continue antihypertensive medications as already ordered, these medications have been reviewed and there are no changes at this time.    Current Outpatient Medications on File Prior to Visit  Medication Sig Dispense Refill   acetaminophen (TYLENOL) 500 MG tablet Take 1 tablet (500 mg total) by mouth every 6 (six) hours as needed for mild pain. 180 tablet 0   albuterol (VENTOLIN HFA) 108 (90 Base) MCG/ACT inhaler Inhale 2 puffs into the lungs every 6 (six) hours as needed for wheezing.     allopurinol (ZYLOPRIM) 100 MG tablet Take 200 mg by mouth daily.      ascorbic acid (VITAMIN C) 500 MG tablet Take 500 mg by mouth daily.     beclomethasone (QVAR) 80 MCG/ACT inhaler Inhale 1 puff into the lungs as needed (for shortness of breath).     carvedilol (COREG CR) 10 MG 24 hr capsule Take 10 mg by mouth daily.      carvedilol (COREG) 25 MG tablet Take 25 mg by mouth.      cinacalcet (SENSIPAR) 60 MG tablet Take 180 mg by mouth daily.      cinacalcet (SENSIPAR) 90 MG tablet Take 180 mg by mouth daily.     cyanocobalamin 1000 MCG tablet Take 1,000 mcg daily by mouth.     felodipine (PLENDIL) 2.5 MG 24 hr tablet Take 2.5 mg by mouth daily.       FLOVENT HFA 110 MCG/ACT inhaler Inhale 2 puffs into the lungs 2 (two) times daily.     HYDROcodone-acetaminophen (NORCO/VICODIN) 5-325 MG tablet Take 2 tablets by mouth every 6 (six) hours as needed for moderate pain. 20 tablet 0   lacosamide (VIMPAT) 50 MG TABS tablet Take by mouth.     Lacosamide 150 MG TABS Take 1 tablet by mouth 2 (two) times daily.     levETIRAcetam (KEPPRA XR) 500 MG 24 hr tablet Take 500 mg by mouth See admin instructions. Take 500 mg twice a day and takes and additional 500 mg on Mon. Wed and Friday after dialysis     levETIRAcetam (KEPPRA) 500 MG tablet Take by mouth.     omeprazole (PRILOSEC) 20 MG capsule Take 20 mg by mouth daily as needed (Heartburn).      QVAR REDIHALER 80 MCG/ACT inhaler Inhale 1 puff into the lungs 2 (two) times daily.     sevelamer carbonate (RENVELA) 800 MG tablet Take 2,400 mg by mouth 3 (three) times daily with meals.      VIMPAT 100 MG TABS Take 100 mg by mouth See admin instructions. Take 100 mg twice a day and extra 100 mg Mon. Wed. and Friday after dialysys  aspirin 81 MG EC tablet Take 81 mg by mouth daily.  (Patient not taking: Reported on 08/08/2021)     No current facility-administered medications on file prior to visit.    There are no Patient Instructions on file for this visit. No follow-ups on file.   Kris Hartmann, NP

## 2022-11-26 ENCOUNTER — Other Ambulatory Visit (INDEPENDENT_AMBULATORY_CARE_PROVIDER_SITE_OTHER): Payer: Self-pay | Admitting: Nurse Practitioner

## 2022-11-26 DIAGNOSIS — N186 End stage renal disease: Secondary | ICD-10-CM

## 2022-12-02 ENCOUNTER — Encounter (INDEPENDENT_AMBULATORY_CARE_PROVIDER_SITE_OTHER): Payer: Self-pay | Admitting: Vascular Surgery

## 2022-12-02 ENCOUNTER — Ambulatory Visit (INDEPENDENT_AMBULATORY_CARE_PROVIDER_SITE_OTHER): Payer: Medicare HMO | Admitting: Vascular Surgery

## 2022-12-02 ENCOUNTER — Ambulatory Visit (INDEPENDENT_AMBULATORY_CARE_PROVIDER_SITE_OTHER): Payer: Medicare HMO

## 2022-12-02 VITALS — BP 127/75 | HR 71 | Resp 16 | Wt 147.0 lb

## 2022-12-02 DIAGNOSIS — N186 End stage renal disease: Secondary | ICD-10-CM

## 2022-12-02 DIAGNOSIS — Z992 Dependence on renal dialysis: Secondary | ICD-10-CM

## 2022-12-02 DIAGNOSIS — I10 Essential (primary) hypertension: Secondary | ICD-10-CM | POA: Diagnosis not present

## 2022-12-02 DIAGNOSIS — T829XXS Unspecified complication of cardiac and vascular prosthetic device, implant and graft, sequela: Secondary | ICD-10-CM

## 2022-12-02 NOTE — Assessment & Plan Note (Signed)
Duplex today shows some thickening and elevated velocities in the proximal upper arm likely consistent with the distal portion of the jump graft or the anastomosis to the cephalic vein at this location.  No thrombosis is seen.  No other issues with the access is noted.  Given this finding and the difficulties with prolonged bleeding and pain, fistulogram with possible intervention should be performed.  Risks and benefits are discussed and he is agreeable to proceed.

## 2022-12-02 NOTE — Assessment & Plan Note (Signed)
blood pressure control important in reducing the progression of atherosclerotic disease. On appropriate oral medications.  

## 2022-12-02 NOTE — Progress Notes (Signed)
MRN : [id number]  Jeremy Rose is a 67 y.o. (10/23/1956) male who presents with chief complaint of  Chief Complaint  Patient presents with   Follow-up    Consult for  pain in access arm   .  History of Present Illness: Patient returns today in follow up of his dialysis access.  He has been having more pain with the access.  He has also been having more prolonged bleeding with his access.  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  Duplex today shows some thickening and elevated velocities in the proximal upper arm likely consistent with the distal portion of the jump graft or the anastomosis to the cephalic vein at this location.  No thrombosis is seen.  No other issues with the access is noted.  Current Outpatient Medications  Medication Sig Dispense Refill   acetaminophen (TYLENOL) 500 MG tablet Take 1 tablet (500 mg total) by mouth every 6 (six) hours as needed for mild pain. 180 tablet 0   albuterol (VENTOLIN HFA) 108 (90 Base) MCG/ACT inhaler Inhale 2 puffs into the lungs every 6 (six) hours as needed for wheezing.     allopurinol (ZYLOPRIM) 100 MG tablet Take 200 mg by mouth daily.      ascorbic acid (VITAMIN C) 500 MG tablet Take 500 mg by mouth daily.     beclomethasone (QVAR) 80 MCG/ACT inhaler Inhale 1 puff into the lungs as needed (for shortness of breath).     carvedilol (COREG CR) 10 MG 24 hr capsule Take 10 mg by mouth daily.      carvedilol (COREG) 25 MG tablet Take 25 mg by mouth.      cinacalcet (SENSIPAR) 60 MG tablet Take 180 mg by mouth daily.      cinacalcet (SENSIPAR) 90 MG tablet Take 180 mg by mouth daily.     cyanocobalamin 1000 MCG tablet Take 1,000 mcg daily by mouth.     felodipine (PLENDIL) 2.5 MG 24 hr tablet Take 2.5 mg by mouth daily.      FLOVENT HFA 110 MCG/ACT inhaler Inhale 2 puffs into the lungs 2 (two) times daily.     lacosamide (VIMPAT) 50 MG TABS tablet Take by mouth.      Lacosamide 150 MG TABS Take 1 tablet by mouth 2 (two) times daily.     levETIRAcetam (KEPPRA XR) 500 MG 24 hr tablet Take 500 mg by mouth See admin instructions. Take 500 mg twice a day and takes and additional 500 mg on Mon. Wed and Friday after dialysis     levETIRAcetam (KEPPRA) 500 MG tablet Take by mouth.     omeprazole (PRILOSEC) 20 MG capsule Take 20 mg by mouth daily as needed (Heartburn).      QVAR REDIHALER 80 MCG/ACT inhaler Inhale 1 puff into the lungs 2 (two) times daily.     sevelamer carbonate (RENVELA) 800 MG tablet Take 2,400 mg by mouth 3 (three) times daily with meals.      VIMPAT 100 MG TABS Take 100 mg by mouth See admin instructions. Take 100 mg twice a day and extra 100 mg Mon. Wed. and Friday after dialysys     aspirin 81 MG EC tablet Take 81 mg by mouth daily.  (Patient not taking: Reported on 08/08/2021)     No current facility-administered medications for this visit.    Past Medical History:  Diagnosis Date   Anemia    Arthritis  hands   Asthma    uses inhaler   Coronary artery disease    Dialysis patient [facility]) 2012   Monday, Wednesday, Friday   GERD (gastroesophageal reflux disease)    Gout of right hand    Hypertension    Pneumonia    Renal disorder    stage V..on dialysis   Seizures ([place]) 2019   last seizure 5 years ago. had 20 in one day   Stroke [facility])     Past Surgical History:  Procedure Laterality Date   A/V FISTULAGRAM Left 02/24/2017   Procedure: A/V Fistulagram;  Surgeon: Katha Cabal, MD;  Location: [place] [facility];  Service: Cardiovascular;  Laterality: Left;   A/V FISTULAGRAM Left 07/20/2019   Procedure: A/V FISTULAGRAM;  Surgeon: Algernon Huxley, MD;  Location: [place] [facility];  Service: Cardiovascular;  Laterality: Left;   A/V FISTULAGRAM Left 04/03/2020   Procedure: A/V FISTULAGRAM;  Surgeon: Algernon Huxley, MD;  Location: [place] [facility];  Service: Cardiovascular;  Laterality: Left;   A/V FISTULAGRAM Left 09/06/2020    Procedure: A/V FISTULAGRAM;  Surgeon: Algernon Huxley, MD;  Location: [place] [facility];  Service: Cardiovascular;  Laterality: Left;   A/V FISTULAGRAM Left 01/17/2021   Procedure: A/V FISTULAGRAM;  Surgeon: Algernon Huxley, MD;  Location: [place] [facility];  Service: Cardiovascular;  Laterality: Left;   A/V FISTULAGRAM Left 08/08/2021   Procedure: A/V FISTULAGRAM;  Surgeon: Algernon Huxley, MD;  Location: [place] [facility];  Service: Cardiovascular;  Laterality: Left;   A/V FISTULAGRAM Left 07/17/2022   Procedure: A/V Fistulagram;  Surgeon: Algernon Huxley, MD;  Location: [place] [facility];  Service: Cardiovascular;  Laterality: Left;   A/V SHUNT INTERVENTION N/A 02/24/2017   Procedure: A/V Shunt Intervention;  Surgeon: Katha Cabal, MD;  Location: [place] [facility];  Service: Cardiovascular;  Laterality: N/A;   A/V SHUNT INTERVENTION N/A 11/14/2019   Procedure: A/V SHUNT INTERVENTION;  Surgeon: Algernon Huxley, MD;  Location: [place] [facility];  Service: Cardiovascular;  Laterality: N/A;   AV FISTULA PLACEMENT Left    AV FISTULA PLACEMENT Left 12/02/2017   Procedure: ARTERIOVENOUS (AV) FISTULA CREATION ( BRACHIAL CEPHALIC );  Surgeon: Katha Cabal, MD;  Location: [facility] ORS;  Service: Vascular;  Laterality: Left;   CARDIAC CATHETERIZATION  2012   no stents   COLONOSCOPY Left 09/05/2018   Procedure: COLONOSCOPY;  Surgeon: Virgel Manifold, MD;  Location: [facility] ENDOSCOPY;  Service: Endoscopy;  Laterality: Left;   DIALYSIS/PERMA CATHETER REMOVAL N/A 08/24/2018   Procedure: DIALYSIS/PERMA CATHETER REMOVAL;  Surgeon: Katha Cabal, MD;  Location: [place] [facility];  Service: Cardiovascular;  Laterality: N/A;   DIALYSIS/PERMA CATHETER REMOVAL N/A 01/30/2022   Procedure: DIALYSIS/PERMA CATHETER REMOVAL;  Surgeon: Algernon Huxley, MD;  Location: [place] [facility];  Service: Cardiovascular;  Laterality: N/A;   ESOPHAGOGASTRODUODENOSCOPY Left 09/05/2018   Procedure:  ESOPHAGOGASTRODUODENOSCOPY (EGD);  Surgeon: Virgel Manifold, MD;  Location: [facility] ENDOSCOPY;  Service: Endoscopy;  Laterality: Left;   ESOPHAGOGASTRODUODENOSCOPY (EGD) WITH PROPOFOL N/A 08/31/2018   Procedure: ESOPHAGOGASTRODUODENOSCOPY (EGD) WITH PROPOFOL;  Surgeon: Lucilla Lame, MD;  Location: [facility] ENDOSCOPY;  Service: Endoscopy;  Laterality: N/A;   GIVENS CAPSULE STUDY N/A 09/07/2018   Procedure: GIVENS CAPSULE STUDY;  Surgeon: Jonathon Bellows, MD;  Location: [facility] ENDOSCOPY;  Service: Gastroenterology;  Laterality: N/A;   INSERTION OF DIALYSIS CATHETER Left 05/28/2018   Procedure: INSERTION OF DIALYSIS CATHETER ( TUNNELED CATH INSERT );  Surgeon: Katha Cabal, MD;  Location: [facility] ORS;  Service: Vascular;  Laterality: Left;   INSERTION OF DIALYSIS CATHETER N/A 11/21/2021   Procedure: INSERTION OF DIALYSIS CATHETER (PERMCATH);  Surgeon: Algernon Huxley, MD;  Location: [facility] ORS;  Service: Vascular;  Laterality: N/A;   LIGATION OF ARTERIOVENOUS  FISTULA Left 12/02/2017   Procedure: LIGATION OF ARTERIOVENOUS  FISTULA ( REMOVAL LEFT WRIST FISTULA );  Surgeon: Katha Cabal, MD;  Location: [facility] ORS;  Service: Vascular;  Laterality: Left;   PERIPHERAL VASCULAR CATHETERIZATION Left 06/03/2016   Procedure: A/V Shuntogram/Fistulagram;  Surgeon: Katha Cabal, MD;  Location: [place] [facility];  Service: Cardiovascular;  Laterality: Left;   PERIPHERAL VASCULAR CATHETERIZATION  06/03/2016   Procedure: Peripheral Vascular Balloon Angioplasty;  Surgeon: Katha Cabal, MD;  Location: [place] [facility];  Service: Cardiovascular;;   PERIPHERAL VASCULAR THROMBECTOMY Left 11/14/2019   Procedure: PERIPHERAL VASCULAR THROMBECTOMY;  Surgeon: Algernon Huxley, MD;  Location: [place] [facility];  Service: Cardiovascular;  Laterality: Left;   REVISON OF ARTERIOVENOUS FISTULA Left 05/28/2018   Procedure: REVISON OF ARTERIOVENOUS FISTULA ( REMOVE ANEURYSM );  Surgeon: Katha Cabal, MD;  Location: [facility]  ORS;  Service: Vascular;  Laterality: Left;   REVISON OF ARTERIOVENOUS FISTULA Left 11/21/2021   Procedure: REVISON OF ARTERIOVENOUS FISTULA (ARTEGRAFT);  Surgeon: Algernon Huxley, MD;  Location: [facility] ORS;  Service: Vascular;  Laterality: Left;   UPPER EXTREMITY VENOGRAPHY N/A 04/13/2018   Procedure: UPPER EXTREMITY VENOGRAPHY;  Surgeon: Katha Cabal, MD;  Location: [place] [facility];  Service: Cardiovascular;  Laterality: N/A;     Social History   Tobacco Use   Smoking status: Former    Types: Cigarettes   Smokeless tobacco: Former    Quit date: 2010   Tobacco comments:    did not inhale!!  Scientific laboratory technician Use: Never used  Substance Use Topics   Alcohol use: No   Drug use: No       Family History  Problem Relation Age of Onset   Heart disease Mother    Kidney disease Paternal Uncle      Allergies  Allergen Reactions   Dextromethorphan-Guaifenesin Nausea And Vomiting   Codeine Nausea And Vomiting   Doxycycline Nausea And Vomiting     REVIEW OF SYSTEMS (Negative unless checked)  Constitutional: [] Weight loss  [] Fever  [] Chills Cardiac: [] Chest pain   [] Chest pressure   [] Palpitations   [] Shortness of breath when laying flat   [] Shortness of breath at rest   [x] Shortness of breath with exertion. Vascular:  [] Pain in legs with walking   [] Pain in legs at rest   [] Pain in legs when laying flat   [] Claudication   [] Pain in feet when walking  [] Pain in feet at rest  [] Pain in feet when laying flat   [] History of DVT   [] Phlebitis   [] Swelling in legs   [] Varicose veins   [] Non-healing ulcers Pulmonary:   [] Uses home oxygen   [] Productive cough   [] Hemoptysis   [] Wheeze  [] COPD   [] Asthma Neurologic:  [] Dizziness  [] Blackouts   [x] Seizures   [] History of stroke   [] History of TIA  [] Aphasia   [] Temporary blindness   [] Dysphagia   [] Weakness or numbness in arms   [] Weakness or numbness in legs Musculoskeletal:  [] Arthritis   [] Joint swelling   [] Joint pain   [] Low back  pain Hematologic:  [] Easy bruising  [] Easy bleeding   [] Hypercoagulable state   [  x]Anemic   Gastrointestinal:  [] Blood in stool   [] Vomiting blood  [] Gastroesophageal reflux/heartburn   [] Abdominal pain Genitourinary:  [x] Chronic kidney disease   [] Difficult urination  [] Frequent urination  [] Burning with urination   [] Hematuria Skin:  [] Rashes   [] Ulcers   [] Wounds Psychological:  [] History of anxiety   []  History of major depression.  Physical Examination  BP 127/75 (BP Location: Right Arm)   Pulse 71   Resp 16   Wt 147 lb (66.7 kg)   BMI 22.35 kg/m  Gen:  WD/WN, NAD Head: [place]/AT, No temporalis wasting. Ear/Nose/Throat: Hearing grossly intact, nares w/o erythema or drainage Eyes: Conjunctiva clear. Sclera non-icteric Neck: Supple.  Trachea midline Pulmonary:  Good air movement, no use of accessory muscles.  Cardiac: RRR, no JVD Vascular: AV fistula and jump graft in the left upper arm have a good thrill but is somewhat pulsatile. Vessel Right Left  Radial Palpable Palpable                       Musculoskeletal: M/S 5/5 throughout.  No deformity or atrophy.  No edema. Neurologic: Sensation grossly intact in extremities.  Symmetrical.  Speech is fluent.  Psychiatric: Judgment intact, Mood & affect appropriate for pt's clinical situation. Dermatologic: No rashes or ulcers noted.  No cellulitis or open wounds.      Labs No results found for this or any previous visit (from the past 2160 hour(s)).  Radiology No results found.  Assessment/Plan  ESRD on hemodialysis (HCC) Duplex today shows some thickening and elevated velocities in the proximal upper arm likely consistent with the distal portion of the jump graft or the anastomosis to the cephalic vein at this location.  No thrombosis is seen.  No other issues with the access is noted.  Given this finding and the difficulties with prolonged bleeding and pain, fistulogram with possible intervention should be performed.   Risks and benefits are discussed and he is agreeable to proceed.  Hypertension blood pressure control important in reducing the progression of atherosclerotic disease. On appropriate oral medications.   Complication of AV dialysis fistula Duplex today shows some thickening and elevated velocities in the proximal upper arm likely consistent with the distal portion of the jump graft or the anastomosis to the cephalic vein at this location.  No thrombosis is seen.  No other issues with the access is noted.  Given this finding and the difficulties with prolonged bleeding and pain, fistulogram with possible intervention should be performed.  Risks and benefits are discussed and he is agreeable to proceed.    [name], MD  12/02/2022 3:57 PM    This note was created with Dragon medical transcription system.  Any errors from dictation are purely unintentional

## 2022-12-02 NOTE — H&P (View-Only) (Signed)
MRN : [id number]  Jeremy Rose is a 67 y.o. ([birth year]) male who presents with chief complaint of  Chief Complaint  Patient presents with   Follow-up    Consult for  pain in access arm   .  History of Present Illness: Patient returns today in follow up of his dialysis access.  He has been having more pain with the access.  He has also been having more prolonged bleeding with his access.  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  Duplex today shows some thickening and elevated velocities in the proximal upper arm likely consistent with the distal portion of the jump graft or the anastomosis to the cephalic vein at this location.  No thrombosis is seen.  No other issues with the access is noted.  Current Outpatient Medications  Medication Sig Dispense Refill   acetaminophen (TYLENOL) 500 MG tablet Take 1 tablet (500 mg total) by mouth every 6 (six) hours as needed for mild pain. 180 tablet 0   albuterol (VENTOLIN HFA) 108 (90 Base) MCG/ACT inhaler Inhale 2 puffs into the lungs every 6 (six) hours as needed for wheezing.     allopurinol (ZYLOPRIM) 100 MG tablet Take 200 mg by mouth daily.      ascorbic acid (VITAMIN C) 500 MG tablet Take 500 mg by mouth daily.     beclomethasone (QVAR) 80 MCG/ACT inhaler Inhale 1 puff into the lungs as needed (for shortness of breath).     carvedilol (COREG CR) 10 MG 24 hr capsule Take 10 mg by mouth daily.      carvedilol (COREG) 25 MG tablet Take 25 mg by mouth.      cinacalcet (SENSIPAR) 60 MG tablet Take 180 mg by mouth daily.      cinacalcet (SENSIPAR) 90 MG tablet Take 180 mg by mouth daily.     cyanocobalamin 1000 MCG tablet Take 1,000 mcg daily by mouth.     felodipine (PLENDIL) 2.5 MG 24 hr tablet Take 2.5 mg by mouth daily.      FLOVENT HFA 110 MCG/ACT inhaler Inhale 2 puffs into the lungs 2 (two) times daily.     lacosamide (VIMPAT) 50 MG TABS tablet Take by mouth.      Lacosamide 150 MG TABS Take 1 tablet by mouth 2 (two) times daily.     levETIRAcetam (KEPPRA XR) 500 MG 24 hr tablet Take 500 mg by mouth See admin instructions. Take 500 mg twice a day and takes and additional 500 mg on Mon. Wed and Friday after dialysis     levETIRAcetam (KEPPRA) 500 MG tablet Take by mouth.     omeprazole (PRILOSEC) 20 MG capsule Take 20 mg by mouth daily as needed (Heartburn).      QVAR REDIHALER 80 MCG/ACT inhaler Inhale 1 puff into the lungs 2 (two) times daily.     sevelamer carbonate (RENVELA) 800 MG tablet Take 2,400 mg by mouth 3 (three) times daily with meals.      VIMPAT 100 MG TABS Take 100 mg by mouth See admin instructions. Take 100 mg twice a day and extra 100 mg Mon. Wed. and Friday after dialysys     aspirin 81 MG EC tablet Take 81 mg by mouth daily.  (Patient not taking: Reported on 08/08/2021)     No current facility-administered medications for this visit.    Past Medical History:  Diagnosis Date   Anemia    Arthritis  hands   Asthma    uses inhaler   Coronary artery disease    Dialysis patient [facility]) 2012   Monday, Wednesday, Friday   GERD (gastroesophageal reflux disease)    Gout of right hand    Hypertension    Pneumonia    Renal disorder    stage V..on dialysis   Seizures ([place]) 2019   last seizure 5 years ago. had 20 in one day   Stroke [facility])     Past Surgical History:  Procedure Laterality Date   A/V FISTULAGRAM Left 02/24/2017   Procedure: A/V Fistulagram;  Surgeon: Katha Cabal, MD;  Location: [place] [facility];  Service: Cardiovascular;  Laterality: Left;   A/V FISTULAGRAM Left 07/20/2019   Procedure: A/V FISTULAGRAM;  Surgeon: Algernon Huxley, MD;  Location: [place] [facility];  Service: Cardiovascular;  Laterality: Left;   A/V FISTULAGRAM Left 04/03/2020   Procedure: A/V FISTULAGRAM;  Surgeon: Algernon Huxley, MD;  Location: [place] [facility];  Service: Cardiovascular;  Laterality: Left;   A/V FISTULAGRAM Left 09/06/2020    Procedure: A/V FISTULAGRAM;  Surgeon: Algernon Huxley, MD;  Location: [place] [facility];  Service: Cardiovascular;  Laterality: Left;   A/V FISTULAGRAM Left 01/17/2021   Procedure: A/V FISTULAGRAM;  Surgeon: Algernon Huxley, MD;  Location: [place] [facility];  Service: Cardiovascular;  Laterality: Left;   A/V FISTULAGRAM Left 08/08/2021   Procedure: A/V FISTULAGRAM;  Surgeon: Algernon Huxley, MD;  Location: [place] [facility];  Service: Cardiovascular;  Laterality: Left;   A/V FISTULAGRAM Left 07/17/2022   Procedure: A/V Fistulagram;  Surgeon: Algernon Huxley, MD;  Location: [place] [facility];  Service: Cardiovascular;  Laterality: Left;   A/V SHUNT INTERVENTION N/A 02/24/2017   Procedure: A/V Shunt Intervention;  Surgeon: Katha Cabal, MD;  Location: [place] [facility];  Service: Cardiovascular;  Laterality: N/A;   A/V SHUNT INTERVENTION N/A 11/14/2019   Procedure: A/V SHUNT INTERVENTION;  Surgeon: Algernon Huxley, MD;  Location: [place] [facility];  Service: Cardiovascular;  Laterality: N/A;   AV FISTULA PLACEMENT Left    AV FISTULA PLACEMENT Left 12/02/2017   Procedure: ARTERIOVENOUS (AV) FISTULA CREATION ( BRACHIAL CEPHALIC );  Surgeon: Katha Cabal, MD;  Location: [facility];  Service: Vascular;  Laterality: Left;   CARDIAC CATHETERIZATION  2012   no stents   COLONOSCOPY Left 09/05/2018   Procedure: COLONOSCOPY;  Surgeon: Virgel Manifold, MD;  Location: [facility] ENDOSCOPY;  Service: Endoscopy;  Laterality: Left;   DIALYSIS/PERMA CATHETER REMOVAL N/A 08/24/2018   Procedure: DIALYSIS/PERMA CATHETER REMOVAL;  Surgeon: Katha Cabal, MD;  Location: [place] [facility];  Service: Cardiovascular;  Laterality: N/A;   DIALYSIS/PERMA CATHETER REMOVAL N/A 01/30/2022   Procedure: DIALYSIS/PERMA CATHETER REMOVAL;  Surgeon: Algernon Huxley, MD;  Location: [place] [facility];  Service: Cardiovascular;  Laterality: N/A;   ESOPHAGOGASTRODUODENOSCOPY Left 09/05/2018   Procedure:  ESOPHAGOGASTRODUODENOSCOPY (EGD);  Surgeon: Virgel Manifold, MD;  Location: [facility] ENDOSCOPY;  Service: Endoscopy;  Laterality: Left;   ESOPHAGOGASTRODUODENOSCOPY (EGD) WITH PROPOFOL N/A 08/31/2018   Procedure: ESOPHAGOGASTRODUODENOSCOPY (EGD) WITH PROPOFOL;  Surgeon: Lucilla Lame, MD;  Location: [facility] ENDOSCOPY;  Service: Endoscopy;  Laterality: N/A;   GIVENS CAPSULE STUDY N/A 09/07/2018   Procedure: GIVENS CAPSULE STUDY;  Surgeon: Jonathon Bellows, MD;  Location: [facility] ENDOSCOPY;  Service: Gastroenterology;  Laterality: N/A;   INSERTION OF DIALYSIS CATHETER Left 05/28/2018   Procedure: INSERTION OF DIALYSIS CATHETER ( TUNNELED CATH INSERT );  Surgeon: Katha Cabal, MD;  Location: [facility] ORS;  Service: Vascular;  Laterality: Left;   INSERTION OF DIALYSIS CATHETER N/A 11/21/2021   Procedure: INSERTION OF DIALYSIS CATHETER (PERMCATH);  Surgeon: Algernon Huxley, MD;  Location: [facility];  Service: Vascular;  Laterality: N/A;   LIGATION OF ARTERIOVENOUS  FISTULA Left 12/02/2017   Procedure: LIGATION OF ARTERIOVENOUS  FISTULA ( REMOVAL LEFT WRIST FISTULA );  Surgeon: Katha Cabal, MD;  Location: [facility];  Service: Vascular;  Laterality: Left;   PERIPHERAL VASCULAR CATHETERIZATION Left 06/03/2016   Procedure: A/V Shuntogram/Fistulagram;  Surgeon: Katha Cabal, MD;  Location: [place] [facility];  Service: Cardiovascular;  Laterality: Left;   PERIPHERAL VASCULAR CATHETERIZATION  06/03/2016   Procedure: Peripheral Vascular Balloon Angioplasty;  Surgeon: Katha Cabal, MD;  Location: [place] [facility];  Service: Cardiovascular;;   PERIPHERAL VASCULAR THROMBECTOMY Left 11/14/2019   Procedure: PERIPHERAL VASCULAR THROMBECTOMY;  Surgeon: Algernon Huxley, MD;  Location: [place] [facility];  Service: Cardiovascular;  Laterality: Left;   REVISON OF ARTERIOVENOUS FISTULA Left 05/28/2018   Procedure: REVISON OF ARTERIOVENOUS FISTULA ( REMOVE ANEURYSM );  Surgeon: Katha Cabal, MD;  Location: [facility]  ORS;  Service: Vascular;  Laterality: Left;   REVISON OF ARTERIOVENOUS FISTULA Left 11/21/2021   Procedure: REVISON OF ARTERIOVENOUS FISTULA (ARTEGRAFT);  Surgeon: Algernon Huxley, MD;  Location: [facility] OR[facility];  Service: Vascular;  Laterality: Left;   UPPER EXTREMITY VENOGRAPHY N/A 04/13/2018   Procedure: UPPER EXTREMITY VENOGRAPHY;  Surgeon: Katha Cabal, MD;  Location: [place] [facility];  Service: Cardiovascular;  Laterality: N/A;     Social History   Tobacco Use   Smoking status: Former    Types: Cigarettes   Smokeless tobacco: Former    Quit date: 2010   Tobacco comments:    did not inhale!!  Scientific laboratory technician Use: Never used  Substance Use Topics   Alcohol use: No   Drug use: No       Family History  Problem Relation Age of Onset   Heart disease Mother    Kidney disease Paternal Uncle      Allergies  Allergen Reactions   Dextromethorphan-Guaifenesin Nausea And Vomiting   Codeine Nausea And Vomiting   Doxycycline Nausea And Vomiting     REVIEW OF SYSTEMS (Negative unless checked)  Constitutional: [] Weight loss  [] Fever  [] Chills Cardiac: [] Chest pain   [] Chest pressure   [] Palpitations   [] Shortness of breath when laying flat   [] Shortness of breath at rest   [x] Shortness of breath with exertion. Vascular:  [] Pain in legs with walking   [] Pain in legs at rest   [] Pain in legs when laying flat   [] Claudication   [] Pain in feet when walking  [] Pain in feet at rest  [] Pain in feet when laying flat   [] History of DVT   [] Phlebitis   [] Swelling in legs   [] Varicose veins   [] Non-healing ulcers Pulmonary:   [] Uses home oxygen   [] Productive cough   [] Hemoptysis   [] Wheeze  [] COPD   [] Asthma Neurologic:  [] Dizziness  [] Blackouts   [x] Seizures   [] History of stroke   [] History of TIA  [] Aphasia   [] Temporary blindness   [] Dysphagia   [] Weakness or numbness in arms   [] Weakness or numbness in legs Musculoskeletal:  [] Arthritis   [] Joint swelling   [] Joint pain   [] Low back  pain Hematologic:  [] Easy bruising  [] Easy bleeding   [] Hypercoagulable state   [  x]Anemic   Gastrointestinal:  [] Blood in stool   [] Vomiting blood  [] Gastroesophageal reflux/heartburn   [] Abdominal pain Genitourinary:  [x] Chronic kidney disease   [] Difficult urination  [] Frequent urination  [] Burning with urination   [] Hematuria Skin:  [] Rashes   [] Ulcers   [] Wounds Psychological:  [] History of anxiety   []  History of major depression.  Physical Examination  BP 127/75 (BP Location: Right Arm)   Pulse 71   Resp 16   Wt 147 lb (66.7 kg)   BMI 22.35 kg/m  Gen:  WD/WN, NAD Head: [place]/AT, No temporalis wasting. Ear/Nose/Throat: Hearing grossly intact, nares w/o erythema or drainage Eyes: Conjunctiva clear. Sclera non-icteric Neck: Supple.  Trachea midline Pulmonary:  Good air movement, no use of accessory muscles.  Cardiac: RRR, no JVD Vascular: AV fistula and jump graft in the left upper arm have a good thrill but is somewhat pulsatile. Vessel Right Left  Radial Palpable Palpable                       Musculoskeletal: M/S 5/5 throughout.  No deformity or atrophy.  No edema. Neurologic: Sensation grossly intact in extremities.  Symmetrical.  Speech is fluent.  Psychiatric: Judgment intact, Mood & affect appropriate for pt's clinical situation. Dermatologic: No rashes or ulcers noted.  No cellulitis or open wounds.      Labs No results found for this or any previous visit (from the past 2160 hour(s)).  Radiology No results found.  Assessment/Plan  ESRD on hemodialysis (HCC) Duplex today shows some thickening and elevated velocities in the proximal upper arm likely consistent with the distal portion of the jump graft or the anastomosis to the cephalic vein at this location.  No thrombosis is seen.  No other issues with the access is noted.  Given this finding and the difficulties with prolonged bleeding and pain, fistulogram with possible intervention should be performed.   Risks and benefits are discussed and he is agreeable to proceed.  Hypertension blood pressure control important in reducing the progression of atherosclerotic disease. On appropriate oral medications.   Complication of AV dialysis fistula Duplex today shows some thickening and elevated velocities in the proximal upper arm likely consistent with the distal portion of the jump graft or the anastomosis to the cephalic vein at this location.  No thrombosis is seen.  No other issues with the access is noted.  Given this finding and the difficulties with prolonged bleeding and pain, fistulogram with possible intervention should be performed.  Risks and benefits are discussed and he is agreeable to proceed.    [name], MD  12/02/2022 3:57 PM    This note was created with Dragon medical transcription system.  Any errors from dictation are purely unintentional

## 2022-12-11 ENCOUNTER — Telehealth (INDEPENDENT_AMBULATORY_CARE_PROVIDER_SITE_OTHER): Payer: Self-pay

## 2022-12-11 NOTE — Telephone Encounter (Signed)
Spoke with the patient's sister Thayer Headings and he is scheduled with [name] on 12/18/22 with a 9:30 am [facility] fistul[facility]. Pre-procedure instructions were discussed and will be mailed.

## 2022-12-18 ENCOUNTER — Encounter
Admission: RE | Disposition: A | Discharge status: Home / Self Care | Payer: Self-pay | Source: Home / Self Care | Attending: Vascular Surgery

## 2022-12-18 ENCOUNTER — Ambulatory Visit
Admission: RE | Admit: 2022-12-18 | Discharge: 2022-12-18 | Disposition: A | Discharge status: Home / Self Care | Payer: Medicare HMO | Attending: Vascular Surgery | Admitting: Vascular Surgery

## 2022-12-18 ENCOUNTER — Other Ambulatory Visit: Payer: Self-pay

## 2022-12-18 ENCOUNTER — Encounter: Payer: Self-pay | Admitting: Vascular Surgery

## 2022-12-18 DIAGNOSIS — T82858A Stenosis of vascular prosthetic devices, implants and grafts, initial encounter: Secondary | ICD-10-CM | POA: Diagnosis present

## 2022-12-18 DIAGNOSIS — N186 End stage renal disease: Secondary | ICD-10-CM | POA: Insufficient documentation

## 2022-12-18 DIAGNOSIS — Y841 Kidney dialysis as the cause of abnormal reaction of the patient, or of later complication, without mention of misadventure at the time of the procedure: Secondary | ICD-10-CM | POA: Insufficient documentation

## 2022-12-18 DIAGNOSIS — Z992 Dependence on renal dialysis: Secondary | ICD-10-CM | POA: Diagnosis not present

## 2022-12-18 DIAGNOSIS — Z87891 Personal history of nicotine dependence: Secondary | ICD-10-CM | POA: Insufficient documentation

## 2022-12-18 DIAGNOSIS — I120 Hypertensive chronic kidney disease with stage 5 chronic kidney disease or end stage renal disease: Secondary | ICD-10-CM | POA: Insufficient documentation

## 2022-12-18 HISTORY — PX: A/V FISTULAGRAM: CATH118298

## 2022-12-18 LAB — POTASSIUM (ARMC VASCULAR LAB ONLY): Potassium (ARMC vascular lab): 4.5 mmol/L (ref 3.5–5.1)

## 2022-12-18 SURGERY — A/V FISTULAGRAM
Anesthesia: Moderate Sedation | Laterality: Left

## 2022-12-18 MED ORDER — ONDANSETRON HCL 4 MG/2ML IJ SOLN: 4.0000 mg | Freq: Four times a day (QID) | INTRAMUSCULAR | Status: DC | PRN

## 2022-12-18 MED ORDER — FENTANYL CITRATE PF 50 MCG/ML IJ SOSY
PREFILLED_SYRINGE | INTRAMUSCULAR | Status: AC
  Filled 2022-12-18: qty 2

## 2022-12-18 MED ORDER — DIPHENHYDRAMINE HCL 50 MG/ML IJ SOLN: 50.0000 mg | Freq: Once | INTRAMUSCULAR | Status: DC | PRN

## 2022-12-18 MED ORDER — HEPARIN SODIUM (PORCINE) 1000 UNIT/ML IJ SOLN
INTRAMUSCULAR | Status: AC
  Filled 2022-12-18: qty 10

## 2022-12-18 MED ORDER — FAMOTIDINE 20 MG PO TABS: 40.0000 mg | ORAL_TABLET | Freq: Once | ORAL | Status: DC | PRN

## 2022-12-18 MED ORDER — HEPARIN SODIUM (PORCINE) 1000 UNIT/ML IJ SOLN
INTRAMUSCULAR | Status: DC | PRN
  Administered 2022-12-18: 3000 [IU] via INTRAVENOUS

## 2022-12-18 MED ORDER — FENTANYL CITRATE (PF) 100 MCG/2ML IJ SOLN
INTRAMUSCULAR | Status: DC | PRN
  Administered 2022-12-18: 50 ug via INTRAVENOUS

## 2022-12-18 MED ORDER — MIDAZOLAM HCL 2 MG/ML PO SYRP: 8.0000 mg | ORAL_SOLUTION | Freq: Once | ORAL | Status: DC | PRN

## 2022-12-18 MED ORDER — CEFAZOLIN SODIUM-DEXTROSE 1-4 GM/50ML-% IV SOLN
INTRAVENOUS | Status: AC
  Administered 2022-12-18: 1 g via INTRAVENOUS
  Filled 2022-12-18: qty 50

## 2022-12-18 MED ORDER — HYDROMORPHONE HCL 1 MG/ML IJ SOLN: 1.0000 mg | Freq: Once | INTRAMUSCULAR | Status: DC | PRN

## 2022-12-18 MED ORDER — METHYLPREDNISOLONE SODIUM SUCC 125 MG IJ SOLR: 125.0000 mg | Freq: Once | INTRAMUSCULAR | Status: DC | PRN

## 2022-12-18 MED ORDER — MIDAZOLAM HCL 2 MG/2ML IJ SOLN
INTRAMUSCULAR | Status: DC | PRN
  Administered 2022-12-18: 2 mg via INTRAVENOUS

## 2022-12-18 MED ORDER — CEFAZOLIN SODIUM-DEXTROSE 1-4 GM/50ML-% IV SOLN: 1.0000 g | INTRAVENOUS | Status: AC

## 2022-12-18 MED ORDER — SODIUM CHLORIDE 0.9 % IV SOLN: INTRAVENOUS | Status: DC

## 2022-12-18 MED ORDER — MIDAZOLAM HCL 2 MG/2ML IJ SOLN
INTRAMUSCULAR | Status: AC
  Filled 2022-12-18: qty 4

## 2022-12-18 SURGICAL SUPPLY — 12 items
BALLN LUTONIX AV 8X40X75 (BALLOONS) ×1
BALLOON LUTONIX AV 8X40X75 (BALLOONS) IMPLANT
COVER PROBE ULTRASOUND 5X96 (MISCELLANEOUS) IMPLANT
DRAPE BRACHIAL (DRAPES) IMPLANT
GLIDEWIRE ADV .035X180CM (WIRE) IMPLANT
KIT ENCORE 26 ADVANTAGE (KITS) IMPLANT
KIT MICROPUNCTURE NIT STIFF (SHEATH) IMPLANT
NDL ENTRY 21GA 7CM ECHOTIP (NEEDLE) IMPLANT
NEEDLE ENTRY 21GA 7CM ECHOTIP (NEEDLE) ×1 IMPLANT
PACK ANGIOGRAPHY (CUSTOM PROCEDURE TRAY) ×1 IMPLANT
SHEATH BRITE TIP 6FRX5.5 (SHEATH) IMPLANT
SUT MNCRL AB 4-0 PS2 18 (SUTURE) IMPLANT

## 2022-12-18 NOTE — Op Note (Signed)
[facility] [facility]    OPERATIVE NOTE   PROCEDURE: 1.   Left brachiocephalic arteriovenous fistula cannulation under ultrasound guidance 2.   Left arm fistulagram including central venogram 3.   Percutaneous transluminal angioplasty of the junction of the jump graft to the proximal upper arm cephalic vein stenosis with 8 mm diameter by 6 cm length Lutonix drug-coated angioplasty balloon  PRE-OPERATIVE DIAGNOSIS: 1. ESRD 2. Poorly functional left brachiocephalic AVF with previous Artegraft jump graft  POST-OPERATIVE DIAGNOSIS: same as above   SURGEON: [name], MD  ANESTHESIA: local with MCS  ESTIMATED BLOOD LOSS: 3 cc  FINDING(S): The arteriovenous anastomosis was patent as well as the antecubital anastomosis to the jump graft without significant stenosis.  The jump graft itself was patent but at the anastomosis of the jump graft to the proximal arm cephalic vein was a hyperplastic stenosis of about 70%.  The proximal arm cephalic vein was large and patent and the central venous system was patent without significant stenosis.  SPECIMEN(S):  None  CONTRAST: 25 cc  FLUORO TIME: 0.8 minutes  MODERATE CONSCIOUS SEDATION TIME: Approximately 15 minutes with 2 mg of Versed and 50 mcg of Fentanyl   INDICATIONS: Jeremy Rose is a 67 y.o. male who presents with malfunctioning left brachiocephalic arteriovenous fistula that has already had a previous jump graft surgical revision.  The patient is scheduled for left arm fistulagram.  The patient is aware the risks include but are not limited to: bleeding, infection, thrombosis of the cannulated access, and possible anaphylactic reaction to the contrast.  The patient is aware of the risks of the procedure and elects to proceed forward.  DESCRIPTION: After full informed written consent was obtained, the patient was brought back to the angiography suite and placed supine upon the angiography table.  The patient was  connected to monitoring equipment. Moderate conscious sedation was administered with a face to face encounter with the patient throughout the procedure with my supervision of the RN administering medicines and monitoring the patient's vital signs and mental status throughout from the start of the procedure until the patient was taken to the recovery room. The left arm was prepped and draped in the standard fashion for a percutaneous access intervention.  Under ultrasound guidance, the left brachiocephalic arteriovenous fistula was cannulated with a micropuncture needle under direct ultrasound guidance where it was patent near the original brachiocephalic anastomosis and a permanent image was performed.  The microwire was advanced into the fistula and the needle was exchanged for the a microsheath.  I then upsized to a 6 Fr Sheath and imaging was performed.  Hand injections were completed to image the access including the central venous system. This demonstrated that the arteriovenous anastomosis was patent as well as the antecubital anastomosis to the jump graft without significant stenosis.  The jump graft itself was patent but at the anastomosis of the jump graft to the proximal arm cephalic vein was a hyperplastic stenosis of about 70%.  The proximal arm cephalic vein was large and patent and the central venous system was patent without significant stenosis..  Based on the images, this patient will need intervention to the stenosis of the jump graft to the proximal arm cephalic vein. I then gave the patient 3000 units of intravenous heparin.  I then crossed the stenosis with an Advantage wire.  Based on the imaging, a 8 mm x 6 cm  Lutonix drug coated angioplasty balloon was selected.  The balloon was centered around  the junction of the jump graft to the proximal arm cephalic vein stenosis and inflated to 10 ATM for 1 minute(s).  On completion imaging, a 10-15% residual stenosis was present.     Based on the  completion imaging, no further intervention is necessary.  The wire and balloon were removed from the sheath.  A 4-0 Monocryl purse-string suture was sewn around the sheath.  The sheath was removed while tying down the suture.  A sterile bandage was applied to the puncture site.  COMPLICATIONS: None  CONDITION: Stable   [name]  12/18/2022 11:03 AM   This note was created with Dragon Medical transcription system. Any errors in dictation are purely unintentional.

## 2022-12-18 NOTE — Progress Notes (Signed)
[name] at bedside, speaking with pt. Re: procedure. Pt. Verbalized understanding of conversation at present.

## 2022-12-18 NOTE — Interval H&P Note (Signed)
History and Physical Interval Note:  12/18/2022 10:11 AM  Jeremy Rose  has presented today for surgery, with the diagnosis of L Arm Fistulagram   End Stage Renal.  The various methods of treatment have been discussed with the patient and family. After consideration of risks, benefits and other options for treatment, the patient has consented to  Procedure(s): A/V Fistulagram (Left) as a surgical intervention.  The patient's history has been reviewed, patient examined, no change in status, stable for surgery.  I have reviewed the patient's chart and labs.  Questions were answered to the patient's satisfaction.     [name]

## 2022-12-19 ENCOUNTER — Encounter: Payer: Self-pay | Admitting: Vascular Surgery

## 2023-01-28 ENCOUNTER — Other Ambulatory Visit (INDEPENDENT_AMBULATORY_CARE_PROVIDER_SITE_OTHER): Payer: Self-pay | Admitting: Vascular Surgery

## 2023-01-28 DIAGNOSIS — N186 End stage renal disease: Secondary | ICD-10-CM

## 2023-01-28 DIAGNOSIS — Z9862 Peripheral vascular angioplasty status: Secondary | ICD-10-CM

## 2023-01-29 ENCOUNTER — Ambulatory Visit (INDEPENDENT_AMBULATORY_CARE_PROVIDER_SITE_OTHER): Payer: Medicare HMO

## 2023-01-29 ENCOUNTER — Ambulatory Visit (INDEPENDENT_AMBULATORY_CARE_PROVIDER_SITE_OTHER): Payer: Medicare HMO | Admitting: Nurse Practitioner

## 2023-01-29 ENCOUNTER — Encounter (INDEPENDENT_AMBULATORY_CARE_PROVIDER_SITE_OTHER): Payer: Self-pay | Admitting: Nurse Practitioner

## 2023-01-29 VITALS — BP 127/74 | HR 84 | Resp 16 | Wt 147.6 lb

## 2023-01-29 DIAGNOSIS — N186 End stage renal disease: Secondary | ICD-10-CM

## 2023-01-29 DIAGNOSIS — I10 Essential (primary) hypertension: Secondary | ICD-10-CM

## 2023-01-29 NOTE — Progress Notes (Signed)
Subjective:    Patient ID: Jeremy Rose, male    DOB: 1956-09-29, 67 y.o.   MRN: [id number] Chief Complaint  Patient presents with   Follow-up    [facility] 6 week with HDA    The patient returns to the office for followup status post intervention of the dialysis access 12/18/2022.   Following the intervention the excess function was unchanged per the patient.  The patient continues to be experiencing increased bleeding times following decannulation and increased recirculation with diminished efficiency of their dialysis. The patient denies an increase in arm swelling. At the present time the patient denies hand pain.  No recent shortening of the patient's walking distance or new symptoms consistent with claudication.  No history of rest pain symptoms. No new ulcers or wounds of the lower extremities have occurred.  The patient denies amaurosis fugax or recent TIA symptoms. There are no recent neurological changes noted. There is no history of DVT, PE or superficial thrombophlebitis. No recent episodes of angina or shortness of breath documented.   Duplex ultrasound of the AV access shows a patent access.  The previously noted stenosis is not improved compared to last study.  Flow volume today is 1933 cc/min (previous flow volume was 3710 cc/min)    Review of Systems  Hematological:  Bruises/bleeds easily.  All other systems reviewed and are negative.      Objective:   Physical Exam Vitals reviewed.  HENT:     Head: Normocephalic.  Cardiovascular:     Rate and Rhythm: Normal rate.     Pulses:          Radial pulses are 1+ on the left side.  Pulmonary:     Effort: Pulmonary effort is normal.  Skin:    General: Skin is warm and dry.  Neurological:     Mental Status: He is alert and oriented to person, place, and time.  Psychiatric:        Mood and Affect: Mood normal.        Behavior: Behavior normal.        Thought Content: Thought content normal.        Judgment:  Judgment normal.     BP 127/74 (BP Location: Right Arm)   Pulse 84   Resp 16   Wt 147 lb 9.6 oz (67 kg)   BMI 22.44 kg/m   Past Medical History:  Diagnosis Date   Anemia    Arthritis    hands   Asthma    uses inhaler   Coronary artery disease    Dialysis patient [facility]) 2012   Monday, Wednesday, Friday   GERD (gastroesophageal reflux disease)    Gout of right hand    Hypertension    Pneumonia    Renal disorder    stage V..on dialysis   Seizures ([place]) 2019   last seizure 5 years ago. had 20 in one day   Stroke [facility])     Social History   Socioeconomic History   Marital status: Single    Spouse name: Not on file   Number of children: 0   Years of education: Not on file   Highest education level: Not on file  Occupational History   Occupation: disabled  Tobacco Use   Smoking status: Former    Types: Cigarettes    Quit date: 2010    Years since quitting: 14.2   Smokeless tobacco: Former    Quit date: 2010   Tobacco comments:  did not inhale!!  Vaping Use   Vaping Use: Never used  Substance and Sexual Activity   Alcohol use: No   Drug use: No   Sexual activity: Not Currently  Other Topics Concern   Not on file  Social History Narrative   Lives by himself with close neighbors who help him . Neighbor helps him pay his bills. Sister Thayer Headings lives in [place] Determinants of Health   Financial Resource Strain: Not on file  Food Insecurity: Not on file  Transportation Needs: Not on file  Physical Activity: Not on file  Stress: Not on file  Social Connections: Not on file  Intimate Partner Violence: Not on file    Past Surgical History:  Procedure Laterality Date   A/V FISTULAGRAM Left 02/24/2017   Procedure: A/V Fistulagram;  Surgeon: Katha Cabal, MD;  Location: [place] [facility];  Service: Cardiovascular;  Laterality: Left;   A/V FISTULAGRAM Left 07/20/2019   Procedure: A/V FISTULAGRAM;  Surgeon: Algernon Huxley, MD;  Location: [place] [facility];  Service: Cardiovascular;  Laterality: Left;   A/V FISTULAGRAM Left 04/03/2020   Procedure: A/V FISTULAGRAM;  Surgeon: Algernon Huxley, MD;  Location: [place] [facility];  Service: Cardiovascular;  Laterality: Left;   A/V FISTULAGRAM Left 09/06/2020   Procedure: A/V FISTULAGRAM;  Surgeon: Algernon Huxley, MD;  Location: [place] [facility];  Service: Cardiovascular;  Laterality: Left;   A/V FISTULAGRAM Left 01/17/2021   Procedure: A/V FISTULAGRAM;  Surgeon: Algernon Huxley, MD;  Location: [place] [facility];  Service: Cardiovascular;  Laterality: Left;   A/V FISTULAGRAM Left 08/08/2021   Procedure: A/V FISTULAGRAM;  Surgeon: Algernon Huxley, MD;  Location: [place] [facility] LAB;  Service: Cardiovascular;  Laterality: Left;   A/V FISTULAGRAM Left 07/17/2022   Procedure: A/V Fistulagram;  Surgeon: Algernon Huxley, MD;  Location: [place] [facility];  Service: Cardiovascular;  Laterality: Left;   A/V FISTULAGRAM Left 12/18/2022   Procedure: A/V Fistulagram;  Surgeon: Algernon Huxley, MD;  Location: [place] [facility];  Service: Cardiovascular;  Laterality: Left;   A/V SHUNT INTERVENTION N/A 02/24/2017   Procedure: A/V Shunt Intervention;  Surgeon: Katha Cabal, MD;  Location: [place] [facility];  Service: Cardiovascular;  Laterality: N/A;   A/V SHUNT INTERVENTION N/A 11/14/2019   Procedure: A/V SHUNT INTERVENTION;  Surgeon: Algernon Huxley, MD;  Location: [place] [facility];  Service: Cardiovascular;  Laterality: N/A;   AV FISTULA PLACEMENT Left    AV FISTULA PLACEMENT Left 12/02/2017   Procedure: ARTERIOVENOUS (AV) FISTULA CREATION ( BRACHIAL CEPHALIC );  Surgeon: Katha Cabal, MD;  Location: [facility];  Service: Vascular;  Laterality: Left;   CARDIAC CATHETERIZATION  2012   no stents   COLONOSCOPY Left 09/05/2018   Procedure: COLONOSCOPY;  Surgeon: Virgel Manifold, MD;  Location: [facility] ENDOSCOPY;  Service: Endoscopy;  Laterality: Left;   DIALYSIS/PERMA CATHETER REMOVAL N/A  08/24/2018   Procedure: DIALYSIS/PERMA CATHETER REMOVAL;  Surgeon: Katha Cabal, MD;  Location: [place] [facility];  Service: Cardiovascular;  Laterality: N/A;   DIALYSIS/PERMA CATHETER REMOVAL N/A 01/30/2022   Procedure: DIALYSIS/PERMA CATHETER REMOVAL;  Surgeon: Algernon Huxley, MD;  Location: [place] [facility];  Service: Cardiovascular;  Laterality: N/A;   ESOPHAGOGASTRODUODENOSCOPY Left 09/05/2018   Procedure: ESOPHAGOGASTRODUODENOSCOPY (EGD);  Surgeon: Virgel Manifold, MD;  Location: [facility] ENDOSCOPY;  Service: Endoscopy;  Laterality: Left;   ESOPHAGOGASTRODUODENOSCOPY (EGD) WITH PROPOFOL N/A 08/31/2018  Procedure: ESOPHAGOGASTRODUODENOSCOPY (EGD) WITH PROPOFOL;  Surgeon: Lucilla Lame, MD;  Location: [facility] ENDOSCOPY;  Service: Endoscopy;  Laterality: N/A;   GIVENS CAPSULE STUDY N/A 09/07/2018   Procedure: GIVENS CAPSULE STUDY;  Surgeon: Jonathon Bellows, MD;  Location: [facility] ENDOSCOPY;  Service: Gastroenterology;  Laterality: N/A;   INSERTION OF DIALYSIS CATHETER Left 05/28/2018   Procedure: INSERTION OF DIALYSIS CATHETER ( TUNNELED CATH INSERT );  Surgeon: Katha Cabal, MD;  Location: [facility];  Service: Vascular;  Laterality: Left;   INSERTION OF DIALYSIS CATHETER N/A 11/21/2021   Procedure: INSERTION OF DIALYSIS CATHETER (PERMCATH);  Surgeon: Algernon Huxley, MD;  Location: [facility];  Service: Vascular;  Laterality: N/A;   LIGATION OF ARTERIOVENOUS  FISTULA Left 12/02/2017   Procedure: LIGATION OF ARTERIOVENOUS  FISTULA ( REMOVAL LEFT WRIST FISTULA );  Surgeon: Katha Cabal, MD;  Location: [facility];  Service: Vascular;  Laterality: Left;   PERIPHERAL VASCULAR CATHETERIZATION Left 06/03/2016   Procedure: A/V Shuntogram/Fistulagram;  Surgeon: Katha Cabal, MD;  Location: [place] [facility] LAB;  Service: Cardiovascular;  Laterality: Left;   PERIPHERAL VASCULAR CATHETERIZATION  06/03/2016   Procedure: Peripheral Vascular Balloon Angioplasty;  Surgeon: Katha Cabal, MD;   Location: [place] CV LAB;  Service: Cardiovascular;;   PERIPHERAL VASCULAR THROMBECTOMY Left 11/14/2019   Procedure: PERIPHERAL VASCULAR THROMBECTOMY;  Surgeon: Algernon Huxley, MD;  Location: [place] CV LAB;  Service: Cardiovascular;  Laterality: Left;   REVISON OF ARTERIOVENOUS FISTULA Left 05/28/2018   Procedure: REVISON OF ARTERIOVENOUS FISTULA ( REMOVE ANEURYSM );  Surgeon: Katha Cabal, MD;  Location: [facility];  Service: Vascular;  Laterality: Left;   REVISON OF ARTERIOVENOUS FISTULA Left 11/21/2021   Procedure: REVISON OF ARTERIOVENOUS FISTULA (ARTEGRAFT);  Surgeon: Algernon Huxley, MD;  Location: [facility];  Service: Vascular;  Laterality: Left;   UPPER EXTREMITY VENOGRAPHY N/A 04/13/2018   Procedure: UPPER EXTREMITY VENOGRAPHY;  Surgeon: Katha Cabal, MD;  Location: [place] [facility];  Service: Cardiovascular;  Laterality: N/A;    Family History  Problem Relation Age of Onset   Heart disease Mother    Kidney disease Paternal Uncle     Allergies  Allergen Reactions   Dextromethorphan-Guaifenesin Nausea And Vomiting   Codeine Nausea And Vomiting   Doxycycline Nausea And Vomiting       Latest Ref Rng & Units 11/21/2021   11:20 AM 11/21/2021   11:13 AM 09/29/2018    4:04 AM  CBC  WBC 4.0 - 10.5 K/uL  6.5  9.1   Hemoglobin 13.0 - 17.0 g/dL 13.6  13.3  8.6   Hematocrit 39.0 - 52.0 % 40.0  39.7  26.6   Platelets 150 - 400 K/uL  237  229       CMP     Component Value Date/Time   NA 136 11/21/2021 1120   NA 138 05/18/2014 2142   K 4.2 11/21/2021 1120   K 5.0 06/28/2014 1254   CL 95 (L) 11/21/2021 1120   CL 98 05/18/2014 2142   CO2 32 11/21/2021 1113   CO2 33 (H) 05/18/2014 2142   GLUCOSE 107 (H) 11/21/2021 1120   GLUCOSE 88 05/18/2014 2142   BUN 20 11/21/2021 1120   BUN 12 05/18/2014 2142   CREATININE 7.00 (H) 11/21/2021 1120   CREATININE 5.73 (H) 05/18/2014 2142   CALCIUM 9.4 11/21/2021 1113   CALCIUM 8.3 (L) 05/18/2014 2142   PROT 6.9 09/24/2018  1554   PROT 7.2 07/05/2013 1100   ALBUMIN 3.5  09/24/2018 1554   ALBUMIN 4.1 07/05/2013 1100   AST 17 09/24/2018 1554   AST 20 07/05/2013 1100   ALT 11 09/24/2018 1554   ALT 24 07/05/2013 1100   ALKPHOS 81 09/24/2018 1554   ALKPHOS 102 07/05/2013 1100   BILITOT 0.7 09/24/2018 1554   BILITOT 0.4 07/05/2013 1100   GFRNONAA 9 (L) 11/21/2021 1113   GFRNONAA 10 (L) 05/18/2014 2142   GFRAA 5 (L) 09/29/2018 0404   GFRAA 12 (L) 05/18/2014 2142     No results found.     Assessment & Plan:   1. ESRD (end stage renal disease) ([place]) Recommend:  The patient is experiencing increasing problems with their dialysis access.  Patient should have a fistulagram with the intention for intervention.  The intention for intervention is to restore appropriate flow and prevent thrombosis and possible loss of the access.  As well as improve the quality of dialysis therapy.  The risks, benefits and alternative therapies were reviewed in detail with the patient.  All questions were answered.  The patient agrees to proceed with angio/intervention.    The patient will follow up with me in the office after the procedure.  - VAS US DUPLEX DIALYSIS ACCESS (AVF, AVG)  2. Essential hypertension Continue antihypertensive medications as already ordered, these medications have been reviewed and there are no changes at this time.   Current Outpatient Medications on File Prior to Visit  Medication Sig Dispense Refill   acetaminophen (TYLENOL) 500 MG tablet Take 1 tablet (500 mg total) by mouth every 6 (six) hours as needed for mild pain. 180 tablet 0   albuterol (VENTOLIN HFA) 108 (90 Base) MCG/ACT inhaler Inhale 2 puffs into the lungs every 6 (six) hours as needed for wheezing.     allopurinol (ZYLOPRIM) 100 MG tablet Take 200 mg by mouth daily.      ascorbic acid (VITAMIN C) 500 MG tablet Take 500 mg by mouth daily.     beclomethasone (QVAR) 80 MCG/ACT inhaler Inhale 1 puff into the lungs as needed (for  shortness of breath).     carvedilol (COREG CR) 10 MG 24 hr capsule Take 10 mg by mouth daily.      carvedilol (COREG) 25 MG tablet Take 25 mg by mouth.      cinacalcet (SENSIPAR) 60 MG tablet Take 180 mg by mouth daily.      cinacalcet (SENSIPAR) 90 MG tablet Take 180 mg by mouth daily.     cyanocobalamin 1000 MCG tablet Take 1,000 mcg daily by mouth.     felodipine (PLENDIL) 2.5 MG 24 hr tablet Take 2.5 mg by mouth daily.      FLOVENT HFA 110 MCG/ACT inhaler Inhale 2 puffs into the lungs 2 (two) times daily.     lacosamide (VIMPAT) 50 MG TABS tablet Take by mouth.     Lacosamide 150 MG TABS Take 1 tablet by mouth 2 (two) times daily.     levETIRAcetam (KEPPRA XR) 500 MG 24 hr tablet Take 500 mg by mouth See admin instructions. Take 500 mg twice a day and takes and additional 500 mg on Mon. Wed and Friday after dialysis     levETIRAcetam (KEPPRA) 500 MG tablet Take by mouth.     omeprazole (PRILOSEC) 20 MG capsule Take 20 mg by mouth daily as needed (Heartburn).      QVAR REDIHALER 80 MCG/ACT inhaler Inhale 1 puff into the lungs 2 (two) times daily.     sevelamer carbonate (RENVELA) 800 MG tablet  Take 2,400 mg by mouth 3 (three) times daily with meals.      VIMPAT 100 MG TABS Take 100 mg by mouth See admin instructions. Take 100 mg twice a day and extra 100 mg Mon. Wed. and Friday after dialysys     aspirin 81 MG EC tablet Take 81 mg by mouth daily.  (Patient not taking: Reported on 08/08/2021)     No current facility-administered medications on file prior to visit.    There are no Patient Instructions on file for this visit. No follow-ups on file.   Kris Hartmann, NP

## 2023-02-23 ENCOUNTER — Telehealth (INDEPENDENT_AMBULATORY_CARE_PROVIDER_SITE_OTHER): Payer: Self-pay

## 2023-02-23 NOTE — Telephone Encounter (Signed)
Spoke with Jeremy Rose, the patient is scheduled with Dr. Wyn Quaker on 02/26/23 with a 1:30 pm arrival time to the [facility]. Pre-procedure instructions were discussed and will be mailed.

## 2023-02-26 ENCOUNTER — Encounter: Admission: RE | Payer: Self-pay | Source: Home / Self Care

## 2023-02-26 ENCOUNTER — Ambulatory Visit: Admission: RE | Admit: 2023-02-26 | Payer: Medicare HMO | Source: Home / Self Care | Admitting: Vascular Surgery

## 2023-02-26 DIAGNOSIS — N186 End stage renal disease: Secondary | ICD-10-CM

## 2023-02-26 SURGERY — A/V FISTULAGRAM
Anesthesia: Moderate Sedation | Laterality: Left

## 2023-03-03 ENCOUNTER — Telehealth (INDEPENDENT_AMBULATORY_CARE_PROVIDER_SITE_OTHER): Payer: Self-pay

## 2023-03-03 NOTE — Telephone Encounter (Signed)
Patient called to be rescheduled for his left arm fistulagram with Dr. Wyn Quaker at the [facility]. Patient has been rescheduled to 03/12/23 with a 11:45 am arrival time to the [facility]. Pre-procedure instructions were discussed and patient wanted me to mail them to [facility] his caregiver.

## 2023-03-12 ENCOUNTER — Other Ambulatory Visit: Payer: Self-pay

## 2023-03-12 ENCOUNTER — Encounter
Admission: RE | Disposition: A | Discharge status: Home / Self Care | Payer: Self-pay | Source: Home / Self Care | Attending: Vascular Surgery

## 2023-03-12 ENCOUNTER — Encounter: Payer: Self-pay | Admitting: Vascular Surgery

## 2023-03-12 ENCOUNTER — Ambulatory Visit
Admission: RE | Admit: 2023-03-12 | Discharge: 2023-03-12 | Disposition: A | Discharge status: Home / Self Care | Payer: Medicare HMO | Attending: Vascular Surgery | Admitting: Vascular Surgery

## 2023-03-12 DIAGNOSIS — I120 Hypertensive chronic kidney disease with stage 5 chronic kidney disease or end stage renal disease: Secondary | ICD-10-CM | POA: Insufficient documentation

## 2023-03-12 DIAGNOSIS — Y841 Kidney dialysis as the cause of abnormal reaction of the patient, or of later complication, without mention of misadventure at the time of the procedure: Secondary | ICD-10-CM | POA: Insufficient documentation

## 2023-03-12 DIAGNOSIS — N186 End stage renal disease: Secondary | ICD-10-CM | POA: Diagnosis not present

## 2023-03-12 DIAGNOSIS — Z992 Dependence on renal dialysis: Secondary | ICD-10-CM

## 2023-03-12 DIAGNOSIS — Z9889 Other specified postprocedural states: Secondary | ICD-10-CM

## 2023-03-12 DIAGNOSIS — T82858A Stenosis of vascular prosthetic devices, implants and grafts, initial encounter: Secondary | ICD-10-CM | POA: Diagnosis present

## 2023-03-12 HISTORY — PX: A/V FISTULAGRAM: CATH118298

## 2023-03-12 LAB — POTASSIUM (ARMC VASCULAR LAB ONLY): Potassium (ARMC vascular lab): 4.1 mmol/L (ref 3.5–5.1)

## 2023-03-12 SURGERY — A/V FISTULAGRAM
Anesthesia: Moderate Sedation | Laterality: Left

## 2023-03-12 MED ORDER — HYDROMORPHONE HCL 1 MG/ML IJ SOLN: 1.0000 mg | Freq: Once | INTRAMUSCULAR | Status: DC | PRN

## 2023-03-12 MED ORDER — SODIUM CHLORIDE 0.9 % IV SOLN: INTRAVENOUS | Status: DC

## 2023-03-12 MED ORDER — MIDAZOLAM HCL 2 MG/ML PO SYRP: 8.0000 mg | ORAL_SOLUTION | Freq: Once | ORAL | Status: DC | PRN

## 2023-03-12 MED ORDER — CEFAZOLIN SODIUM-DEXTROSE 1-4 GM/50ML-% IV SOLN
INTRAVENOUS | Status: AC
  Filled 2023-03-12: qty 50

## 2023-03-12 MED ORDER — CEFAZOLIN SODIUM-DEXTROSE 1-4 GM/50ML-% IV SOLN: 1.0000 g | INTRAVENOUS | Status: DC

## 2023-03-12 MED ORDER — FENTANYL CITRATE PF 50 MCG/ML IJ SOSY
PREFILLED_SYRINGE | INTRAMUSCULAR | Status: AC
  Filled 2023-03-12: qty 1

## 2023-03-12 MED ORDER — FAMOTIDINE 20 MG PO TABS: 40.0000 mg | ORAL_TABLET | Freq: Once | ORAL | Status: DC | PRN

## 2023-03-12 MED ORDER — MIDAZOLAM HCL 2 MG/2ML IJ SOLN
INTRAMUSCULAR | Status: DC | PRN
  Administered 2023-03-12: 1 mg via INTRAVENOUS

## 2023-03-12 MED ORDER — FENTANYL CITRATE (PF) 100 MCG/2ML IJ SOLN
INTRAMUSCULAR | Status: DC | PRN
  Administered 2023-03-12: 50 ug via INTRAVENOUS

## 2023-03-12 MED ORDER — CEFAZOLIN SODIUM-DEXTROSE 1-4 GM/50ML-% IV SOLN
INTRAVENOUS | Status: DC | PRN
  Administered 2023-03-12: 1 g via INTRAVENOUS

## 2023-03-12 MED ORDER — METHYLPREDNISOLONE SODIUM SUCC 125 MG IJ SOLR: 125.0000 mg | Freq: Once | INTRAMUSCULAR | Status: DC | PRN

## 2023-03-12 MED ORDER — MIDAZOLAM HCL 2 MG/2ML IJ SOLN
INTRAMUSCULAR | Status: AC
  Filled 2023-03-12: qty 2

## 2023-03-12 MED ORDER — HEPARIN SODIUM (PORCINE) 1000 UNIT/ML IJ SOLN
INTRAMUSCULAR | Status: AC
  Filled 2023-03-12: qty 10

## 2023-03-12 MED ORDER — DIPHENHYDRAMINE HCL 50 MG/ML IJ SOLN: 50.0000 mg | Freq: Once | INTRAMUSCULAR | Status: DC | PRN

## 2023-03-12 MED ORDER — ONDANSETRON HCL 4 MG/2ML IJ SOLN: 4.0000 mg | Freq: Four times a day (QID) | INTRAMUSCULAR | Status: DC | PRN

## 2023-03-12 MED ORDER — HEPARIN SODIUM (PORCINE) 1000 UNIT/ML IJ SOLN
INTRAMUSCULAR | Status: DC | PRN
  Administered 2023-03-12: 3000 [IU] via INTRAVENOUS

## 2023-03-12 SURGICAL SUPPLY — 12 items
BALLN DORADO 9X80X80 (BALLOONS) ×1
BALLOON DORADO 9X80X80 (BALLOONS) IMPLANT
CANNULA 5F STIFF (CANNULA) IMPLANT
COVER PROBE ULTRASOUND 5X96 (MISCELLANEOUS) IMPLANT
DRAPE BRACHIAL (DRAPES) IMPLANT
KIT ENCORE 26 ADVANTAGE (KITS) IMPLANT
PACK ANGIOGRAPHY (CUSTOM PROCEDURE TRAY) ×1 IMPLANT
SHEATH BRITE TIP 6FRX5.5 (SHEATH) IMPLANT
SHEATH BRITE TIP 8FR 5.5 (SHEATH) IMPLANT
STENT VIABAHN 9X10X120 (Permanent Stent) IMPLANT
SUT MNCRL AB 4-0 PS2 18 (SUTURE) IMPLANT
WIRE SUPRACORE 190CM (WIRE) IMPLANT

## 2023-03-12 NOTE — H&P (Signed)
Expand All Collapse All     Subjective:      Subjective  Patient ID: Jeremy Rose, male    DOB: 18-Feb-1956, 67 y.o.   MRN: [id number]     Chief Complaint  Patient presents with   Follow-up      [facility] 6 week with HDA         Following the intervention the excess function was unchanged per the patient.  The patient continues to be experiencing increased bleeding times following decannulation and increased recirculation with diminished efficiency of their dialysis. The patient denies an increase in arm swelling. At the present time the patient denies hand pain.   No recent shortening of the patient's walking distance or new symptoms consistent with claudication.  No history of rest pain symptoms. No new ulcers or wounds of the lower extremities have occurred.   The patient denies amaurosis fugax or recent TIA symptoms. There are no recent neurological changes noted. There is no history of DVT, PE or superficial thrombophlebitis. No recent episodes of angina or shortness of breath documented.    Duplex ultrasound of the AV access shows a patent access.  The previously noted stenosis is not improved compared to last study.  Flow volume today is 1933 cc/min (previous flow volume was 3710 cc/min)       Review of Systems  Hematological:  Bruises/bleeds easily.  All other systems reviewed and are negative.         Objective:    Objective  Physical Exam Vitals reviewed.  HENT:     Head: Normocephalic.  Cardiovascular:     Rate and Rhythm: Normal rate.     Pulses:          Radial pulses are 1+ on the left side.  Pulmonary:     Effort: Pulmonary effort is normal.  Skin:    General: Skin is warm and dry.  Neurological:     Mental Status: He is alert and oriented to person, place, and time.  Psychiatric:        Mood and Affect: Mood normal.        Behavior: Behavior normal.        Thought Content: Thought content normal.        Judgment: Judgment normal.         BP 127/74 (BP Location: Right Arm)   Pulse 84   Resp 16   Wt 147 lb 9.6 oz (67 kg)   BMI 22.44 kg/m        Past Medical History:  Diagnosis Date   Anemia     Arthritis      hands   Asthma      uses inhaler   Coronary artery disease     Dialysis patient [facility]) 2012    Monday, Wednesday, Friday   GERD (gastroesophageal reflux disease)     Gout of right hand     Hypertension     Pneumonia     Renal disorder      stage V..on dialysis   Seizures (HCC) 2019    last seizure 5 years ago. had 20 in one day   Stroke [facility])        Social History         Socioeconomic History   Marital status: Single      Spouse name: Not on file   Number of children: 0   Years of education: Not on file   Highest education level: Not on file  Occupational History   Occupation: disabled  Tobacco Use   Smoking status: Former      Types: Cigarettes      Quit date: 2010      Years since quitting: 14.2   Smokeless tobacco: Former      Quit date: 2010   Tobacco comments:      did not inhale!!  Vaping Use   Vaping Use: Never used  Substance and Sexual Activity   Alcohol use: No   Drug use: No   Sexual activity: Not Currently  Other Topics Concern   Not on file  Social History Narrative    Lives by himself with close neighbors who help him . Neighbor helps him pay his bills. Sister Liborio Nixon lives in [place]     Social Determinants of Health    Financial Resource Strain: Not on file  Food Insecurity: Not on file  Transportation Needs: Not on file  Physical Activity: Not on file  Stress: Not on file  Social Connections: Not on file  Intimate Partner Violence: Not on file           Past Surgical History:  Procedure Laterality Date   A/V FISTULAGRAM Left 02/24/2017    Procedure: A/V Fistulagram;  Surgeon: Renford Dills, MD;  Location: [facility] INVASIVE CV LAB;  Service: Cardiovascular;  Laterality: Left;   A/V FISTULAGRAM Left 07/20/2019    Procedure: A/V FISTULAGRAM;  Surgeon: Annice Needy, MD;  Location: [facility];  Service: Cardiovascular;  Laterality: Left;   A/V FISTULAGRAM Left 04/03/2020    Procedure: A/V FISTULAGRAM;  Surgeon: Annice Needy, MD;  Location: [facility];  Service: Cardiovascular;  Laterality: Left;   A/V FISTULAGRAM Left 09/06/2020    Procedure: A/V FISTULAGRAM;  Surgeon: Annice Needy, MD;  Location: [facility];  Service: Cardiovascular;  Laterality: Left;   A/V FISTULAGRAM Left 01/17/2021    Procedure: A/V FISTULAGRAM;  Surgeon: Annice Needy, MD;  Location: [facility];  Service: Cardiovascular;  Laterality: Left;   A/V FISTULAGRAM Left 08/08/2021    Procedure: A/V FISTULAGRAM;  Surgeon: Annice Needy, MD;  Location: [facility];  Service: Cardiovascular;  Laterality: Left;   A/V FISTULAGRAM Left 07/17/2022    Procedure: A/V Fistulagram;  Surgeon: Annice Needy, MD;  Location: [facility];  Service: Cardiovascular;  Laterality: Left;   A/V FISTULAGRAM Left 12/18/2022    Procedure: A/V Fistulagram;  Surgeon: Annice Needy, MD;  Location: [facility];  Service: Cardiovascular;  Laterality: Left;   A/V SHUNT INTERVENTION N/A 02/24/2017    Procedure: A/V Shunt Intervention;  Surgeon: Renford Dills, MD;  Location: [facility];  Service: Cardiovascular;  Laterality: N/A;   A/V SHUNT INTERVENTION N/A 11/14/2019    Procedure: A/V SHUNT INTERVENTION;  Surgeon: Annice Needy, MD;  Location: [facility];  Service: Cardiovascular;  Laterality: N/A;   AV FISTULA PLACEMENT Left     AV FISTULA PLACEMENT Left 12/02/2017    Procedure: ARTERIOVENOUS (AV) FISTULA CREATION ( BRACHIAL CEPHALIC );  Surgeon: Renford Dills, MD;  Location: [facility] ORS;  Service: Vascular;  Laterality: Left;   CARDIAC CATHETERIZATION   2012    no stents   COLONOSCOPY Left 09/05/2018    Procedure: COLONOSCOPY;  Surgeon: Pasty Spillers, MD;  Location: [facility] ENDOSCOPY;  Service: Endoscopy;  Laterality: Left;    DIALYSIS/PERMA CATHETER REMOVAL N/A 08/24/2018    Procedure: DIALYSIS/PERMA CATHETER  REMOVAL;  Surgeon: Renford Dills, MD;  Location: [facility];  Service: Cardiovascular;  Laterality: N/A;   DIALYSIS/PERMA CATHETER REMOVAL N/A 01/30/2022    Procedure: DIALYSIS/PERMA CATHETER REMOVAL;  Surgeon: Annice Needy, MD;  Location: [facility];  Service: Cardiovascular;  Laterality: N/A;   ESOPHAGOGASTRODUODENOSCOPY Left 09/05/2018    Procedure: ESOPHAGOGASTRODUODENOSCOPY (EGD);  Surgeon: Pasty Spillers, MD;  Location: [facility] ENDOSCOPY;  Service: Endoscopy;  Laterality: Left;   ESOPHAGOGASTRODUODENOSCOPY (EGD) WITH PROPOFOL N/A 08/31/2018    Procedure: ESOPHAGOGASTRODUODENOSCOPY (EGD) WITH PROPOFOL;  Surgeon: Midge Minium, MD;  Location: [facility] ENDOSCOPY;  Service: Endoscopy;  Laterality: N/A;   GIVENS CAPSULE STUDY N/A 09/07/2018    Procedure: GIVENS CAPSULE STUDY;  Surgeon: Wyline Mood, MD;  Location: [facility] ENDOSCOPY;  Service: Gastroenterology;  Laterality: N/A;   INSERTION OF DIALYSIS CATHETER Left 05/28/2018    Procedure: INSERTION OF DIALYSIS CATHETER ( TUNNELED CATH INSERT );  Surgeon: Renford Dills, MD;  Location: [facility] ORS;  Service: Vascular;  Laterality: Left;   INSERTION OF DIALYSIS CATHETER N/A 11/21/2021    Procedure: INSERTION OF DIALYSIS CATHETER (PERMCATH);  Surgeon: Annice Needy, MD;  Location: [facility] ORS;  Service: Vascular;  Laterality: N/A;   LIGATION OF ARTERIOVENOUS  FISTULA Left 12/02/2017    Procedure: LIGATION OF ARTERIOVENOUS  FISTULA ( REMOVAL LEFT WRIST FISTULA );  Surgeon: Renford Dills, MD;  Location: [facility] ORS;  Service: Vascular;  Laterality: Left;   PERIPHERAL VASCULAR CATHETERIZATION Left 06/03/2016    Procedure: A/V Shuntogram/Fistulagram;  Surgeon: Renford Dills, MD;  Location: [facility];  Service: Cardiovascular;  Laterality: Left;   PERIPHERAL VASCULAR CATHETERIZATION   06/03/2016    Procedure: Peripheral Vascular Balloon  Angioplasty;  Surgeon: Renford Dills, MD;  Location: [facility];  Service: Cardiovascular;;   PERIPHERAL VASCULAR THROMBECTOMY Left 11/14/2019    Procedure: PERIPHERAL VASCULAR THROMBECTOMY;  Surgeon: Annice Needy, MD;  Location: [facility];  Service: Cardiovascular;  Laterality: Left;   REVISON OF ARTERIOVENOUS FISTULA Left 05/28/2018    Procedure: REVISON OF ARTERIOVENOUS FISTULA ( REMOVE ANEURYSM );  Surgeon: Renford Dills, MD;  Location: [facility] ORS;  Service: Vascular;  Laterality: Left;   REVISON OF ARTERIOVENOUS FISTULA Left 11/21/2021    Procedure: REVISON OF ARTERIOVENOUS FISTULA (ARTEGRAFT);  Surgeon: Annice Needy, MD;  Location: [facility] ORS;  Service: Vascular;  Laterality: Left;   UPPER EXTREMITY VENOGRAPHY N/A 04/13/2018    Procedure: UPPER EXTREMITY VENOGRAPHY;  Surgeon: Renford Dills, MD;  Location: [facility];  Service: Cardiovascular;  Laterality: N/A;           Family History  Problem Relation Age of Onset   Heart disease Mother     Kidney disease Paternal Uncle            Allergies  Allergen Reactions   Dextromethorphan-Guaifenesin Nausea And Vomiting   Codeine Nausea And Vomiting   Doxycycline Nausea And Vomiting          Latest Ref Rng & Units 11/21/2021   11:20 AM 11/21/2021   11:13 AM 09/29/2018    4:04 AM  CBC  WBC 4.0 - 10.5 K/uL   6.5  9.1   Hemoglobin 13.0 - 17.0 g/dL 28.4  13.2  8.6   Hematocrit 39.0 - 52.0 % 40.0  39.7  26.6   Platelets 150 - 400 K/uL   237  229           CMP  Labs (Brief)          Component Value Date/Time    NA 136 11/21/2021 1120    NA 138 05/18/2014 2142    K 4.2 11/21/2021 1120    K 5.0 06/28/2014 1254    CL 95 (L) 11/21/2021 1120    CL 98 05/18/2014 2142    CO2 32 11/21/2021 1113    CO2 33 (H) 05/18/2014 2142    GLUCOSE 107 (H) 11/21/2021 1120    GLUCOSE 88 05/18/2014 2142    BUN 20 11/21/2021 1120    BUN 12 05/18/2014 2142    CREATININE 7.00 (H) 11/21/2021 1120    CREATININE  5.73 (H) 05/18/2014 2142    CALCIUM 9.4 11/21/2021 1113    CALCIUM 8.3 (L) 05/18/2014 2142    PROT 6.9 09/24/2018 1554    PROT 7.2 07/05/2013 1100    ALBUMIN 3.5 09/24/2018 1554    ALBUMIN 4.1 07/05/2013 1100    AST 17 09/24/2018 1554    AST 20 07/05/2013 1100    ALT 11 09/24/2018 1554    ALT 24 07/05/2013 1100    ALKPHOS 81 09/24/2018 1554    ALKPHOS 102 07/05/2013 1100    BILITOT 0.7 09/24/2018 1554    BILITOT 0.4 07/05/2013 1100    GFRNONAA 9 (L) 11/21/2021 1113    GFRNONAA 10 (L) 05/18/2014 2142    GFRAA 5 (L) 09/29/2018 0404    GFRAA 12 (L) 05/18/2014 2142          No results found.       Assessment & Plan:    1. ESRD (end stage renal disease) (HCC) Recommend:   The patient is experiencing increasing problems with their dialysis access.   Patient should have a fistulagram with the intention for intervention.  The intention for intervention is to restore appropriate flow and prevent thrombosis and possible loss of the access.  As well as improve the quality of dialysis therapy.   The risks, benefits and alternative therapies were reviewed in detail with the patient.  All questions were answered.  The patient agrees to proceed with angio/intervention.     The patient will follow up with me in the office after the procedure.   - VAS US DUPLEX DIALYSIS ACCESS (AVF, AVG)   2. Essential hypertension Continue antihypertensive medications as already ordered, these medications have been reviewed and there are no changes at this time.       Festus Barren, MD  03/12/2023 12:12 PM

## 2023-03-12 NOTE — Op Note (Signed)
[facility]    OPERATIVE NOTE   PROCEDURE: 1.   Left brachiocephalic arteriovenous fistula cannulation under ultrasound guidance 2.   Left arm fistulagram including central venogram 3.   Stent placement to the anastomosis of the proximal upper arm cephalic vein to the Artegraft with 9 mm diameter by 10 cm length Viabahn stent  PRE-OPERATIVE DIAGNOSIS: 1. ESRD 2. Poorly functional left brachiocephalic AVF after previous Artegraft revision  POST-OPERATIVE DIAGNOSIS: same as above   SURGEON: Festus Barren, MD  ANESTHESIA: local with MCS  ESTIMATED BLOOD LOSS: 3 cc  FINDING(S): Recurrent 70% stenosis of the anastomosis of the proximal upper arm cephalic vein to the Artegraft.  The remainder of the Artegraft, cephalic vein, and central venous circulation appear to be widely patent.  SPECIMEN(S):  None  CONTRAST: 25 cc  FLUORO TIME: 1.8 minutes  MODERATE CONSCIOUS SEDATION TIME: Approximately 23 minutes with 1 mg of Versed and 50 mcg of Fentanyl   INDICATIONS: Jeremy Rose is a 67 y.o. male who presents with malfunctioning left brachiocephalic arteriovenous fistula.  This is already been previously revised with an Artegraft, and we have previously treated the area of the Artegraft to the proximal upper arm cephalic vein anastomosis for stenosis.  The patient is scheduled for left arm fistulagram.  The patient is aware the risks include but are not limited to: bleeding, infection, thrombosis of the cannulated access, and possible anaphylactic reaction to the contrast.  The patient is aware of the risks of the procedure and elects to proceed forward.  DESCRIPTION: After full informed written consent was obtained, the patient was brought back to the angiography suite and placed supine upon the angiography table.  The patient was connected to monitoring equipment. Moderate conscious sedation was administered with a face to face encounter with the patient  throughout the procedure with my supervision of the RN administering medicines and monitoring the patient's vital signs and mental status throughout from the start of the procedure until the patient was taken to the recovery room. The left arm was prepped and draped in the standard fashion for a percutaneous access intervention.  Under ultrasound guidance, the left brachiocephalic arteriovenous fistula was cannulated with a micropuncture needle under direct ultrasound guidance where it was patent and a permanent image was performed.  The microwire was advanced into the fistula and the needle was exchanged for the a microsheath.  I then upsized to a 6 Fr Sheath and imaging was performed.  Hand injections were completed to image the access including the central venous system. This demonstrated Recurrent 70% stenosis of the anastomosis of the proximal upper arm cephalic vein to the Artegraft.  The remainder of the Artegraft, cephalic vein, and central venous circulation appear to be widely patent.  Based on the images, this patient will need intervention to the anastomosis of the cephalic vein to the Artegraft in the proximal upper arm. I then gave the patient 3000 units of intravenous heparin.  I then crossed the stenosis with a Supracore wire.  A 9 mm diameter by 10 cm length Viabahn stent was deployed across the stenosis of the Artegraft to the proximal upper arm cephalic vein.  This was postdilated with a 9 mm diameter high-pressure angioplasty balloon.  On completion imaging, a less than 10% residual stenosis was present.     Based on the completion imaging, no further intervention is necessary.  The wire and balloon were removed from the sheath.  A 4-0 Monocryl purse-string suture was  sewn around the sheath.  The sheath was removed while tying down the suture.  A sterile bandage was applied to the puncture site.  COMPLICATIONS: None  CONDITION: Stable   Festus Barren  03/12/2023 1:35 PM   This note  was created with Dragon Medical transcription system. Any errors in dictation are purely unintentional.

## 2023-03-13 ENCOUNTER — Encounter: Payer: Self-pay | Admitting: Vascular Surgery

## 2023-04-21 ENCOUNTER — Other Ambulatory Visit (INDEPENDENT_AMBULATORY_CARE_PROVIDER_SITE_OTHER): Payer: Self-pay | Admitting: Vascular Surgery

## 2023-04-21 DIAGNOSIS — N186 End stage renal disease: Secondary | ICD-10-CM

## 2023-04-23 ENCOUNTER — Ambulatory Visit (INDEPENDENT_AMBULATORY_CARE_PROVIDER_SITE_OTHER): Payer: Medicare HMO | Admitting: Nurse Practitioner

## 2023-04-23 ENCOUNTER — Encounter (INDEPENDENT_AMBULATORY_CARE_PROVIDER_SITE_OTHER): Payer: Medicare HMO

## 2023-05-28 ENCOUNTER — Ambulatory Visit (INDEPENDENT_AMBULATORY_CARE_PROVIDER_SITE_OTHER): Payer: Medicare HMO

## 2023-05-28 ENCOUNTER — Ambulatory Visit (INDEPENDENT_AMBULATORY_CARE_PROVIDER_SITE_OTHER): Payer: Medicare HMO | Admitting: Nurse Practitioner

## 2023-05-28 ENCOUNTER — Encounter (INDEPENDENT_AMBULATORY_CARE_PROVIDER_SITE_OTHER): Payer: Self-pay | Admitting: Nurse Practitioner

## 2023-05-28 VITALS — BP 91/54 | HR 71 | Resp 16 | Wt 142.4 lb

## 2023-05-28 DIAGNOSIS — I10 Essential (primary) hypertension: Secondary | ICD-10-CM

## 2023-05-28 DIAGNOSIS — Z992 Dependence on renal dialysis: Secondary | ICD-10-CM

## 2023-05-28 DIAGNOSIS — N186 End stage renal disease: Secondary | ICD-10-CM

## 2023-05-29 ENCOUNTER — Encounter (INDEPENDENT_AMBULATORY_CARE_PROVIDER_SITE_OTHER): Payer: Self-pay | Admitting: Nurse Practitioner

## 2023-05-29 NOTE — Progress Notes (Signed)
Subjective:    Patient ID: Jeremy Rose, male    DOB: 10-Nov-1956, 67 y.o.   MRN: [id number] Chief Complaint  Patient presents with   Follow-up    [facility] 4 week with HDA    The patient returns to the office for followup of their dialysis access.   The patient reports the function of the access has been stable. Patient denies difficulty with cannulation. The patient denies increased bleeding time after removing the needles. The patient denies hand pain or other symptoms consistent with steal phenomena.  No significant arm swelling.  He does note that he has some pain in the needle insertion site.  He also has some pain up his arm but the is are not consistent pain.  The patient denies any complaints from the dialysis center or their nephrologist.  The patient denies redness or swelling at the access site. The patient denies fever or chills at home or while on dialysis.  No recent shortening of the patient's walking distance or new symptoms consistent with claudication.  No history of rest pain symptoms. No new ulcers or wounds of the lower extremities have occurred.  The patient denies amaurosis fugax or recent TIA symptoms. There are no recent neurological changes noted. There is no history of DVT, PE or superficial thrombophlebitis. No recent episodes of angina or shortness of breath documented.   Duplex ultrasound of the AV access shows a patent access.  The previously noted stenosis is not significantly changed compared to last study.  Flow volume today is 2178 cc/min (previous flow volume was 19.. cc/min)      Review of Systems  Hematological:  Does not bruise/bleed easily.  All other systems reviewed and are negative.      Objective:   Physical Exam Vitals reviewed.  HENT:     Head: Normocephalic.  Cardiovascular:     Rate and Rhythm: Normal rate.     Pulses:          Radial pulses are 2+ on the right side and 2+ on the left side.     Arteriovenous access: Left  arteriovenous access is present.    Comments: Good thrill and bruit Pulmonary:     Effort: Pulmonary effort is normal.  Skin:    General: Skin is warm and dry.  Neurological:     Mental Status: He is alert and oriented to person, place, and time.  Psychiatric:        Mood and Affect: Mood normal.        Behavior: Behavior normal.        Thought Content: Thought content normal.        Judgment: Judgment normal.     BP (!) 91/54 (BP Location: Right Arm)   Pulse 71   Resp 16   Wt 142 lb 6.4 oz (64.6 kg)   BMI 21.65 kg/m   Past Medical History:  Diagnosis Date   Anemia    Arthritis    hands   Asthma    uses inhaler   Coronary artery disease    Dialysis patient [facility]) 2012   Monday, Wednesday, Friday   GERD (gastroesophageal reflux disease)    Gout of right hand    Hypertension    Pneumonia    Renal disorder    stage V..on dialysis   Seizures (HCC) 2019   last seizure 5 years ago. had 20 in one day   Stroke [facility])     Social History   Socioeconomic History  Marital status: Single    Spouse name: Not on file   Number of children: 0   Years of education: Not on file   Highest education level: Not on file  Occupational History   Occupation: disabled  Tobacco Use   Smoking status: Former    Current packs/day: 0.00    Types: Cigarettes    Quit date: 2010    Years since quitting: 14.5   Smokeless tobacco: Former    Quit date: 2010   Tobacco comments:    did not inhale!!  Vaping Use   Vaping status: Never Used  Substance and Sexual Activity   Alcohol use: No   Drug use: No   Sexual activity: Not Currently  Other Topics Concern   Not on file  Social History Narrative   Lives by himself with close neighbors who help him . Neighbor helps him pay his bills. Sister Liborio Nixon lives in [place]    Social Determinants of Health   Financial Resource Strain: Not on file  Food Insecurity: Not on file  Transportation Needs: Not on file  Physical Activity: Not on  file  Stress: Not on file  Social Connections: Not on file  Intimate Partner Violence: Not on file    Past Surgical History:  Procedure Laterality Date   A/V FISTULAGRAM Left 02/24/2017   Procedure: A/V Fistulagram;  Surgeon: Renford Dills, MD;  Location: [facility] INVASIVE CV LAB;  Service: Cardiovascular;  Laterality: Left;   A/V FISTULAGRAM Left 07/20/2019   Procedure: A/V FISTULAGRAM;  Surgeon: Annice Needy, MD;  Location: [facility];  Service: Cardiovascular;  Laterality: Left;   A/V FISTULAGRAM Left 04/03/2020   Procedure: A/V FISTULAGRAM;  Surgeon: Annice Needy, MD;  Location: [facility];  Service: Cardiovascular;  Laterality: Left;   A/V FISTULAGRAM Left 09/06/2020   Procedure: A/V FISTULAGRAM;  Surgeon: Annice Needy, MD;  Location: [facility];  Service: Cardiovascular;  Laterality: Left;   A/V FISTULAGRAM Left 01/17/2021   Procedure: A/V FISTULAGRAM;  Surgeon: Annice Needy, MD;  Location: [facility];  Service: Cardiovascular;  Laterality: Left;   A/V FISTULAGRAM Left 08/08/2021   Procedure: A/V FISTULAGRAM;  Surgeon: Annice Needy, MD;  Location: [facility];  Service: Cardiovascular;  Laterality: Left;   A/V FISTULAGRAM Left 07/17/2022   Procedure: A/V Fistulagram;  Surgeon: Annice Needy, MD;  Location: [facility];  Service: Cardiovascular;  Laterality: Left;   A/V FISTULAGRAM Left 12/18/2022   Procedure: A/V Fistulagram;  Surgeon: Annice Needy, MD;  Location: [facility];  Service: Cardiovascular;  Laterality: Left;   A/V FISTULAGRAM Left 03/12/2023   Procedure: A/V Fistulagram;  Surgeon: Annice Needy, MD;  Location: [facility];  Service: Cardiovascular;  Laterality: Left;   A/V SHUNT INTERVENTION N/A 02/24/2017   Procedure: A/V Shunt Intervention;  Surgeon: Renford Dills, MD;  Location: [facility];  Service: Cardiovascular;  Laterality: N/A;   A/V SHUNT INTERVENTION N/A 11/14/2019   Procedure: A/V  SHUNT INTERVENTION;  Surgeon: Annice Needy, MD;  Location: [facility];  Service: Cardiovascular;  Laterality: N/A;   AV FISTULA PLACEMENT Left    AV FISTULA PLACEMENT Left 12/02/2017   Procedure: ARTERIOVENOUS (AV) FISTULA CREATION ( BRACHIAL CEPHALIC );  Surgeon: Renford Dills, MD;  Location: [facility] ORS;  Service: Vascular;  Laterality: Left;   CARDIAC CATHETERIZATION  2012   no stents   COLONOSCOPY Left  09/05/2018   Procedure: COLONOSCOPY;  Surgeon: Pasty Spillers, MD;  Location: [facility] ENDOSCOPY;  Service: Endoscopy;  Laterality: Left;   DIALYSIS/PERMA CATHETER REMOVAL N/A 08/24/2018   Procedure: DIALYSIS/PERMA CATHETER REMOVAL;  Surgeon: Renford Dills, MD;  Location: [facility];  Service: Cardiovascular;  Laterality: N/A;   DIALYSIS/PERMA CATHETER REMOVAL N/A 01/30/2022   Procedure: DIALYSIS/PERMA CATHETER REMOVAL;  Surgeon: Annice Needy, MD;  Location: [facility];  Service: Cardiovascular;  Laterality: N/A;   ESOPHAGOGASTRODUODENOSCOPY Left 09/05/2018   Procedure: ESOPHAGOGASTRODUODENOSCOPY (EGD);  Surgeon: Pasty Spillers, MD;  Location: [facility] ENDOSCOPY;  Service: Endoscopy;  Laterality: Left;   ESOPHAGOGASTRODUODENOSCOPY (EGD) WITH PROPOFOL N/A 08/31/2018   Procedure: ESOPHAGOGASTRODUODENOSCOPY (EGD) WITH PROPOFOL;  Surgeon: Midge Minium, MD;  Location: [facility] ENDOSCOPY;  Service: Endoscopy;  Laterality: N/A;   GIVENS CAPSULE STUDY N/A 09/07/2018   Procedure: GIVENS CAPSULE STUDY;  Surgeon: Wyline Mood, MD;  Location: [facility] ENDOSCOPY;  Service: Gastroenterology;  Laterality: N/A;   INSERTION OF DIALYSIS CATHETER Left 05/28/2018   Procedure: INSERTION OF DIALYSIS CATHETER ( TUNNELED CATH INSERT );  Surgeon: Renford Dills, MD;  Location: [facility] ORS;  Service: Vascular;  Laterality: Left;   INSERTION OF DIALYSIS CATHETER N/A 11/21/2021   Procedure: INSERTION OF DIALYSIS CATHETER (PERMCATH);  Surgeon: Annice Needy, MD;  Location: [facility] OR[facility];  Service:  Vascular;  Laterality: N/A;   LIGATION OF ARTERIOVENOUS  FISTULA Left 12/02/2017   Procedure: LIGATION OF ARTERIOVENOUS  FISTULA ( REMOVAL LEFT WRIST FISTULA );  Surgeon: Renford Dills, MD;  Location: [facility];  Service: Vascular;  Laterality: Left;   PERIPHERAL VASCULAR CATHETERIZATION Left 06/03/2016   Procedure: A/V Shuntogram/Fistulagram;  Surgeon: Renford Dills, MD;  Location: [facility];  Service: Cardiovascular;  Laterality: Left;   PERIPHERAL VASCULAR CATHETERIZATION  06/03/2016   Procedure: Peripheral Vascular Balloon Angioplasty;  Surgeon: Renford Dills, MD;  Location: [facility];  Service: Cardiovascular;;   PERIPHERAL VASCULAR THROMBECTOMY Left 11/14/2019   Procedure: PERIPHERAL VASCULAR THROMBECTOMY;  Surgeon: Annice Needy, MD;  Location: [facility];  Service: Cardiovascular;  Laterality: Left;   REVISON OF ARTERIOVENOUS FISTULA Left 05/28/2018   Procedure: REVISON OF ARTERIOVENOUS FISTULA ( REMOVE ANEURYSM );  Surgeon: Renford Dills, MD;  Location: [facility];  Service: Vascular;  Laterality: Left;   REVISON OF ARTERIOVENOUS FISTULA Left 11/21/2021   Procedure: REVISON OF ARTERIOVENOUS FISTULA (ARTEGRAFT);  Surgeon: Annice Needy, MD;  Location: [facility] OR[facility];  Service: Vascular;  Laterality: Left;   UPPER EXTREMITY VENOGRAPHY N/A 04/13/2018   Procedure: UPPER EXTREMITY VENOGRAPHY;  Surgeon: Renford Dills, MD;  Location: [facility];  Service: Cardiovascular;  Laterality: N/A;    Family History  Problem Relation Age of Onset   Heart disease Mother    Kidney disease Paternal Uncle     Allergies  Allergen Reactions   Dextromethorphan-Guaifenesin Nausea And Vomiting   Codeine Nausea And Vomiting   Doxycycline Nausea And Vomiting       Latest Ref Rng & Units 11/21/2021   11:20 AM 11/21/2021   11:13 AM 09/29/2018    4:04 AM  CBC  WBC 4.0 - 10.5 K/uL  6.5  9.1   Hemoglobin 13.0 - 17.0 g/dL 16.1  09.6  8.6   Hematocrit 39.0 -  52.0 % 40.0  39.7  26.6   Platelets 150 - 400 K/uL  237  229       CMP     Component Value  Date/Time   NA 136 11/21/2021 1120   NA 138 05/18/2014 2142   K 4.2 11/21/2021 1120   K 5.0 06/28/2014 1254   CL 95 (L) 11/21/2021 1120   CL 98 05/18/2014 2142   CO2 32 11/21/2021 1113   CO2 33 (H) 05/18/2014 2142   GLUCOSE 107 (H) 11/21/2021 1120   GLUCOSE 88 05/18/2014 2142   BUN 20 11/21/2021 1120   BUN 12 05/18/2014 2142   CREATININE 7.00 (H) 11/21/2021 1120   CREATININE 5.73 (H) 05/18/2014 2142   CALCIUM 9.4 11/21/2021 1113   CALCIUM 8.3 (L) 05/18/2014 2142   PROT 6.9 09/24/2018 1554   PROT 7.2 07/05/2013 1100   ALBUMIN 3.5 09/24/2018 1554   ALBUMIN 4.1 07/05/2013 1100   AST 17 09/24/2018 1554   AST 20 07/05/2013 1100   ALT 11 09/24/2018 1554   ALT 24 07/05/2013 1100   ALKPHOS 81 09/24/2018 1554   ALKPHOS 102 07/05/2013 1100   BILITOT 0.7 09/24/2018 1554   BILITOT 0.4 07/05/2013 1100   GFRNONAA 9 (L) 11/21/2021 1113   GFRNONAA 10 (L) 05/18/2014 2142     No results found.     Assessment & Plan:   1. ESRD on hemodialysis [facility]) Recommend:  The patient is doing well and currently has adequate dialysis access.  Although he continues to have arm pain however he is a somewhat poor historian and it is hard to track if this may be related to his dialysis versus musculoskeletal issues.  He does not have steal syndrome  However, the patient's previously had arm pain which has been caused by stenosis although it does not appear to have any significant narrowing today.  This raises concerns that the access is at moderate but not high risk for a problem or thrombosis and should be followed more closely  The patient will follow-up with me in the office in 3 months.  The need for a follow up duplex ultrasound will be made at that time based on whether problems with the access are persistent.    2. Essential hypertension Continue antihypertensive medications as already ordered,  these medications have been reviewed and there are no changes at this time.   Current Outpatient Medications on File Prior to Visit  Medication Sig Dispense Refill   acetaminophen (TYLENOL) 500 MG tablet Take 1 tablet (500 mg total) by mouth every 6 (six) hours as needed for mild pain. 180 tablet 0   albuterol (VENTOLIN HFA) 108 (90 Base) MCG/ACT inhaler Inhale 2 puffs into the lungs every 6 (six) hours as needed for wheezing.     allopurinol (ZYLOPRIM) 100 MG tablet Take 200 mg by mouth daily.      ascorbic acid (VITAMIN C) 500 MG tablet Take 500 mg by mouth daily.     beclomethasone (QVAR) 80 MCG/ACT inhaler Inhale 1 puff into the lungs as needed (for shortness of breath).     carvedilol (COREG CR) 10 MG 24 hr capsule Take 10 mg by mouth daily.      carvedilol (COREG) 25 MG tablet Take 25 mg by mouth.      cinacalcet (SENSIPAR) 60 MG tablet Take 180 mg by mouth daily.      cinacalcet (SENSIPAR) 90 MG tablet Take 180 mg by mouth daily.     cyanocobalamin 1000 MCG tablet Take 1,000 mcg daily by mouth.     felodipine (PLENDIL) 2.5 MG 24 hr tablet Take 2.5 mg by mouth daily.      FLOVENT HFA 110 MCG/ACT  inhaler Inhale 2 puffs into the lungs 2 (two) times daily.     lacosamide (VIMPAT) 50 MG TABS tablet Take by mouth.     Lacosamide 150 MG TABS Take 1 tablet by mouth 2 (two) times daily.     levETIRAcetam (KEPPRA XR) 500 MG 24 hr tablet Take 500 mg by mouth See admin instructions. Take 500 mg twice a day and takes and additional 500 mg on Mon. Wed and Friday after dialysis     levETIRAcetam (KEPPRA) 500 MG tablet Take by mouth.     omeprazole (PRILOSEC) 20 MG capsule Take 20 mg by mouth daily as needed (Heartburn).      QVAR REDIHALER 80 MCG/ACT inhaler Inhale 1 puff into the lungs 2 (two) times daily.     sevelamer carbonate (RENVELA) 800 MG tablet Take 2,400 mg by mouth 3 (three) times daily with meals.      VIMPAT 100 MG TABS Take 100 mg by mouth See admin instructions. Take 100 mg twice a  day and extra 100 mg Mon. Wed. and Friday after dialysys     aspirin 81 MG EC tablet Take 81 mg by mouth daily.  (Patient not taking: Reported on 08/08/2021)     No current facility-administered medications on file prior to visit.    There are no Patient Instructions on file for this visit. No follow-ups on file.   Georgiana Spinner, NP

## 2023-07-07 ENCOUNTER — Ambulatory Visit: Admission: RE | Admit: 2023-07-07 | Payer: Medicare HMO | Source: Home / Self Care

## 2023-07-07 ENCOUNTER — Encounter: Admission: RE | Payer: Self-pay | Source: Home / Self Care

## 2023-07-07 SURGERY — COLONOSCOPY WITH PROPOFOL
Anesthesia: General

## 2023-08-31 ENCOUNTER — Other Ambulatory Visit (INDEPENDENT_AMBULATORY_CARE_PROVIDER_SITE_OTHER): Payer: Self-pay | Admitting: Nurse Practitioner

## 2023-08-31 DIAGNOSIS — N186 End stage renal disease: Secondary | ICD-10-CM

## 2023-09-03 ENCOUNTER — Encounter (INDEPENDENT_AMBULATORY_CARE_PROVIDER_SITE_OTHER): Payer: Medicare HMO

## 2023-09-03 ENCOUNTER — Ambulatory Visit (INDEPENDENT_AMBULATORY_CARE_PROVIDER_SITE_OTHER): Payer: Medicare HMO | Admitting: Nurse Practitioner

## 2024-12-08 ENCOUNTER — Other Ambulatory Visit (INDEPENDENT_AMBULATORY_CARE_PROVIDER_SITE_OTHER): Payer: Self-pay | Admitting: Vascular Surgery

## 2024-12-08 ENCOUNTER — Ambulatory Visit (INDEPENDENT_AMBULATORY_CARE_PROVIDER_SITE_OTHER)

## 2024-12-08 DIAGNOSIS — N186 End stage renal disease: Secondary | ICD-10-CM

## 2024-12-13 ENCOUNTER — Ambulatory Visit (INDEPENDENT_AMBULATORY_CARE_PROVIDER_SITE_OTHER): Admitting: Nurse Practitioner

## 2025-01-10 ENCOUNTER — Ambulatory Visit (INDEPENDENT_AMBULATORY_CARE_PROVIDER_SITE_OTHER): Admitting: Nurse Practitioner
